# Patient Record
Sex: Female | Born: 1960 | Race: White | Hispanic: No | Smoking: Former smoker
Health system: Southern US, Community
[De-identification: ages and names within clinical notes are randomized; demographics above are authoritative.]

## PROBLEM LIST (undated history)

## (undated) DIAGNOSIS — R7303 Prediabetes: Secondary | ICD-10-CM

## (undated) DIAGNOSIS — M1990 Unspecified osteoarthritis, unspecified site: Secondary | ICD-10-CM

## (undated) DIAGNOSIS — J189 Pneumonia, unspecified organism: Secondary | ICD-10-CM

## (undated) DIAGNOSIS — J449 Chronic obstructive pulmonary disease, unspecified: Secondary | ICD-10-CM

## (undated) DIAGNOSIS — Z87442 Personal history of urinary calculi: Secondary | ICD-10-CM

## (undated) DIAGNOSIS — C50919 Malignant neoplasm of unspecified site of unspecified female breast: Secondary | ICD-10-CM

## (undated) DIAGNOSIS — T8859XA Other complications of anesthesia, initial encounter: Secondary | ICD-10-CM

## (undated) DIAGNOSIS — D649 Anemia, unspecified: Secondary | ICD-10-CM

## (undated) DIAGNOSIS — E7800 Pure hypercholesterolemia, unspecified: Secondary | ICD-10-CM

## (undated) DIAGNOSIS — I10 Essential (primary) hypertension: Secondary | ICD-10-CM

## (undated) DIAGNOSIS — Z972 Presence of dental prosthetic device (complete) (partial): Secondary | ICD-10-CM

## (undated) HISTORY — DX: Unspecified osteoarthritis, unspecified site: M19.90

## (undated) HISTORY — DX: Essential (primary) hypertension: I10

## (undated) HISTORY — PX: BREAST LUMPECTOMY: SHX2

## (undated) HISTORY — PX: PARTIAL HYSTERECTOMY: SHX80

## (undated) HISTORY — DX: Pure hypercholesterolemia, unspecified: E78.00

## (undated) HISTORY — DX: Prediabetes: R73.03

## (undated) HISTORY — DX: Malignant neoplasm of unspecified site of unspecified female breast: C50.919

## (undated) HISTORY — PX: HAND SURGERY: SHX662

## (undated) HISTORY — PX: BREAST BIOPSY: SHX20

## (undated) HISTORY — DX: Chronic obstructive pulmonary disease, unspecified: J44.9

## (undated) SURGERY — ESOPHAGOGASTRODUODENOSCOPY (EGD) WITH PROPOFOL
Anesthesia: Monitor Anesthesia Care

---

## 2014-01-29 DIAGNOSIS — C50919 Malignant neoplasm of unspecified site of unspecified female breast: Secondary | ICD-10-CM

## 2014-01-29 DIAGNOSIS — Z923 Personal history of irradiation: Secondary | ICD-10-CM

## 2014-01-29 HISTORY — DX: Personal history of irradiation: Z92.3

## 2014-01-29 HISTORY — PX: BREAST LUMPECTOMY WITH AXILLARY LYMPH NODE BIOPSY: SHX5593

## 2014-01-29 HISTORY — DX: Malignant neoplasm of unspecified site of unspecified female breast: C50.919

## 2015-01-30 HISTORY — PX: COLONOSCOPY: SHX174

## 2015-10-13 LAB — PULMONARY FUNCTION TEST

## 2016-09-20 DIAGNOSIS — G4700 Insomnia, unspecified: Secondary | ICD-10-CM | POA: Insufficient documentation

## 2016-09-20 DIAGNOSIS — C50919 Malignant neoplasm of unspecified site of unspecified female breast: Secondary | ICD-10-CM | POA: Insufficient documentation

## 2016-09-20 DIAGNOSIS — E782 Mixed hyperlipidemia: Secondary | ICD-10-CM | POA: Insufficient documentation

## 2016-09-20 DIAGNOSIS — J439 Emphysema, unspecified: Secondary | ICD-10-CM | POA: Insufficient documentation

## 2016-09-20 DIAGNOSIS — I10 Essential (primary) hypertension: Secondary | ICD-10-CM | POA: Insufficient documentation

## 2016-11-13 DIAGNOSIS — D751 Secondary polycythemia: Secondary | ICD-10-CM | POA: Insufficient documentation

## 2016-12-14 DIAGNOSIS — N289 Disorder of kidney and ureter, unspecified: Secondary | ICD-10-CM | POA: Insufficient documentation

## 2017-04-10 LAB — HM MAMMOGRAPHY

## 2017-05-24 DIAGNOSIS — Z9189 Other specified personal risk factors, not elsewhere classified: Secondary | ICD-10-CM | POA: Insufficient documentation

## 2017-05-29 DIAGNOSIS — M65311 Trigger thumb, right thumb: Secondary | ICD-10-CM | POA: Insufficient documentation

## 2017-05-29 DIAGNOSIS — R918 Other nonspecific abnormal finding of lung field: Secondary | ICD-10-CM | POA: Insufficient documentation

## 2017-05-29 DIAGNOSIS — Z5320 Procedure and treatment not carried out because of patient's decision for unspecified reasons: Secondary | ICD-10-CM | POA: Insufficient documentation

## 2017-05-29 DIAGNOSIS — J449 Chronic obstructive pulmonary disease, unspecified: Secondary | ICD-10-CM | POA: Insufficient documentation

## 2017-05-29 DIAGNOSIS — M1812 Unilateral primary osteoarthritis of first carpometacarpal joint, left hand: Secondary | ICD-10-CM | POA: Insufficient documentation

## 2017-05-29 LAB — HEMOGLOBIN A1C: Hemoglobin A1C: 6.5

## 2017-09-18 DIAGNOSIS — S4991XA Unspecified injury of right shoulder and upper arm, initial encounter: Secondary | ICD-10-CM | POA: Diagnosis not present

## 2017-09-18 DIAGNOSIS — M19011 Primary osteoarthritis, right shoulder: Secondary | ICD-10-CM | POA: Diagnosis not present

## 2017-09-18 DIAGNOSIS — S4992XA Unspecified injury of left shoulder and upper arm, initial encounter: Secondary | ICD-10-CM | POA: Diagnosis not present

## 2017-09-18 DIAGNOSIS — I10 Essential (primary) hypertension: Secondary | ICD-10-CM | POA: Diagnosis not present

## 2017-09-18 DIAGNOSIS — S199XXA Unspecified injury of neck, initial encounter: Secondary | ICD-10-CM | POA: Diagnosis not present

## 2017-09-18 DIAGNOSIS — M4312 Spondylolisthesis, cervical region: Secondary | ICD-10-CM | POA: Diagnosis not present

## 2017-09-18 DIAGNOSIS — M25511 Pain in right shoulder: Secondary | ICD-10-CM | POA: Diagnosis not present

## 2017-09-18 DIAGNOSIS — M47892 Other spondylosis, cervical region: Secondary | ICD-10-CM | POA: Diagnosis not present

## 2017-09-18 DIAGNOSIS — M50322 Other cervical disc degeneration at C5-C6 level: Secondary | ICD-10-CM | POA: Diagnosis not present

## 2017-09-18 DIAGNOSIS — M542 Cervicalgia: Secondary | ICD-10-CM | POA: Diagnosis not present

## 2017-09-23 ENCOUNTER — Ambulatory Visit (INDEPENDENT_AMBULATORY_CARE_PROVIDER_SITE_OTHER): Payer: BLUE CROSS/BLUE SHIELD | Admitting: Osteopathic Medicine

## 2017-09-23 ENCOUNTER — Encounter: Payer: Self-pay | Admitting: Osteopathic Medicine

## 2017-09-23 VITALS — BP 161/111 | HR 78 | Temp 98.3°F | Wt 130.7 lb

## 2017-09-23 DIAGNOSIS — I10 Essential (primary) hypertension: Secondary | ICD-10-CM

## 2017-09-23 DIAGNOSIS — S161XXD Strain of muscle, fascia and tendon at neck level, subsequent encounter: Secondary | ICD-10-CM | POA: Diagnosis not present

## 2017-09-23 DIAGNOSIS — E1169 Type 2 diabetes mellitus with other specified complication: Secondary | ICD-10-CM | POA: Insufficient documentation

## 2017-09-23 DIAGNOSIS — Z23 Encounter for immunization: Secondary | ICD-10-CM | POA: Diagnosis not present

## 2017-09-23 DIAGNOSIS — R7303 Prediabetes: Secondary | ICD-10-CM

## 2017-09-23 DIAGNOSIS — J449 Chronic obstructive pulmonary disease, unspecified: Secondary | ICD-10-CM

## 2017-09-23 MED ORDER — AMLODIPINE BESYLATE 5 MG PO TABS: 5.0000 mg | ORAL_TABLET | Freq: Every day | ORAL | 3 refills | Status: DC

## 2017-09-23 MED ORDER — PRAVASTATIN SODIUM 20 MG PO TABS: 20.0000 mg | ORAL_TABLET | Freq: Every day | ORAL | 3 refills | Status: DC

## 2017-09-23 MED ORDER — MELOXICAM 15 MG PO TABS: 15.0000 mg | ORAL_TABLET | Freq: Every day | ORAL | 3 refills | Status: DC

## 2017-09-23 MED ORDER — TIOTROPIUM BROMIDE MONOHYDRATE 2.5 MCG/ACT IN AERS: 1.0000 | INHALATION_SPRAY | Freq: Two times a day (BID) | RESPIRATORY_TRACT | 99 refills | Status: DC
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
## 2017-09-25 ENCOUNTER — Ambulatory Visit: Payer: BLUE CROSS/BLUE SHIELD | Admitting: Physical Therapy

## 2017-09-25 ENCOUNTER — Encounter: Payer: Self-pay | Admitting: Physical Therapy

## 2017-09-25 DIAGNOSIS — M542 Cervicalgia: Secondary | ICD-10-CM

## 2017-09-25 DIAGNOSIS — R293 Abnormal posture: Secondary | ICD-10-CM | POA: Diagnosis not present

## 2017-09-25 DIAGNOSIS — M6281 Muscle weakness (generalized): Secondary | ICD-10-CM | POA: Diagnosis not present

## 2017-09-25 DIAGNOSIS — M25611 Stiffness of right shoulder, not elsewhere classified: Secondary | ICD-10-CM

## 2017-09-25 DIAGNOSIS — J449 Chronic obstructive pulmonary disease, unspecified: Secondary | ICD-10-CM | POA: Diagnosis not present

## 2017-09-25 NOTE — Patient Instructions (Addendum)
Trigger Point Dry Needling  . What is Trigger Point Dry Needling (DN)? o DN is a physical therapy technique used to treat muscle pain and dysfunction. Specifically, DN helps deactivate muscle trigger points (muscle knots).  o A thin filiform needle is used to penetrate the skin and stimulate the underlying trigger point. The goal is for a local twitch response (LTR) to occur and for the trigger point to relax. No medication of any kind is injected during the procedure.   . What Does Trigger Point Dry Needling Feel Like?  o The procedure feels different for each individual patient. Some patients report that they do not actually feel the needle enter the skin and overall the process is not painful. Very mild bleeding may occur. However, many patients feel a deep cramping in the muscle in which the needle was inserted. This is the local twitch response.   Marland Kitchen How Will I feel after the treatment? o Soreness is normal, and the onset of soreness may not occur for a few hours. Typically this soreness does not last longer than two days.  o Bruising is uncommon, however; ice can be used to decrease any possible bruising.  o In rare cases feeling tired or nauseous after the treatment is normal. In addition, your symptoms may get worse before they get better, this period will typically not last longer than 24 hours.   . What Can I do After My Treatment? o Increase your hydration by drinking more water for the next 24 hours. o You may place ice or heat on the areas treated that have become sore, however, do not use heat on inflamed or bruised areas. Heat often brings more relief post needling. o You can continue your regular activities, but vigorous activity is not recommended initially after the treatment for 24 hours. o DN is best combined with other physical therapy such as strengthening, stretching, and other therapies.    Access Code: [email or web address]  URL: [email or web address]  Date: 09/25/2017   Prepared by: Faustino Congress   Exercises  Supine Cervical Rotation AROM on Pillow - 10 reps - 1 sets - 5 sec hold - 2x daily - 7x weekly  Supine Cervical Sidebending Stretch - 10 reps - 1 sets - 5 sec hold - 2x daily - 7x weekly
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
## 2017-10-02 ENCOUNTER — Telehealth: Payer: Self-pay

## 2017-10-02 ENCOUNTER — Encounter: Payer: Self-pay | Admitting: Physical Therapy

## 2017-10-02 ENCOUNTER — Ambulatory Visit: Payer: BLUE CROSS/BLUE SHIELD | Admitting: Physical Therapy

## 2017-10-02 DIAGNOSIS — M542 Cervicalgia: Secondary | ICD-10-CM

## 2017-10-02 DIAGNOSIS — M25611 Stiffness of right shoulder, not elsewhere classified: Secondary | ICD-10-CM | POA: Diagnosis not present

## 2017-10-02 DIAGNOSIS — R293 Abnormal posture: Secondary | ICD-10-CM | POA: Diagnosis not present

## 2017-10-02 DIAGNOSIS — M6281 Muscle weakness (generalized): Secondary | ICD-10-CM | POA: Diagnosis not present

## 2017-10-02 MED ORDER — UMECLIDINIUM BROMIDE 62.5 MCG/INH IN AEPB: 1.0000 | INHALATION_SPRAY | Freq: Every day | RESPIRATORY_TRACT | 11 refills | Status: DC

## 2017-10-02 NOTE — Telephone Encounter (Signed)
As per [employer]  (Ref #. [id number]) - spiriva rx is not covered by ins. Preferred formulary is [employer]. Pls advise, if change is appropriate.

## 2017-10-02 NOTE — Telephone Encounter (Signed)
Sent!

## 2017-10-02 NOTE — Therapy (Signed)
[place], [place], [address] Phone: [phone]   Fax:  [phone]  Physical Therapy Treatment  Patient Details  Name: Sherri Gibson MRN: [id number] Date of Birth: [birth year] Referring Provider: Emeterio Reeve, DO   Encounter Date: 10/02/2017  PT End of Session - 10/02/17 0824    Visit Number  2    Number of Visits  12    Date for PT Re-Evaluation  11/06/17    Authorization Type  [employer] $35 copay (MVC)    PT Start Time  0745    PT Stop Time  0839    PT Time Calculation (min)  54 min    Activity Tolerance  Patient tolerated treatment well    Behavior During Therapy  [facility] for tasks assessed/performed       Past Medical History:  Diagnosis Date  . Breast cancer ([place]) 2016   Left   . COPD (chronic obstructive pulmonary disease) ([place])   . High blood pressure   . High cholesterol     Past Surgical History:  Procedure Laterality Date  . BREAST LUMPECTOMY WITH AXILLARY LYMPH NODE BIOPSY  2016  . PARTIAL HYSTERECTOMY      There were no vitals filed for this visit.  Subjective Assessment - 10/02/17 0743    Subjective  doing okay; still stiff and sore    Patient Stated Goals  improve pain and motion, be able to sleep better    Currently in Pain?  Yes    Pain Score  8     Pain Location  Neck    Pain Orientation  Right    Pain Descriptors / Indicators  Sharp;Aching;Dull    Pain Type  Acute pain    Pain Onset  1 to 4 weeks ago    Pain Frequency  Constant    Aggravating Factors   turning head, trying to sleep on stomach, difficulty with looking up/down    Pain Relieving Factors  heat/ice; muscle relaxer                       [facility] Adult PT Treatment/Exercise - 10/02/17 0748      Exercises   Exercises  Neck      Neck Exercises: Machines for Strengthening   UBE (Upper Arm Bike)  L1 x 2 min; forward only (increased difficulty)      Neck Exercises: Supine   Neck  Retraction  10 reps;5 secs    Cervical Rotation  5 reps;Both   10-15 sec hold   Lateral Flexion  Both;5 reps   10-15 sec hold     Moist Heat Therapy   Number Minutes Moist Heat  15 Minutes    Moist Heat Location  Cervical      Electrical Stimulation   Electrical Stimulation Location  Rt posterior neck/shoulder    Electrical Stimulation Action  IFC    Electrical Stimulation Parameters  to tolerance x 15 min    Electrical Stimulation Goals  Pain      Manual Therapy   Manual Therapy  Soft tissue mobilization    Manual therapy comments  pt prone; skilled palpation and monitoring of soft tissue during DN    Soft tissue mobilization  Rt upper traps and bil suboccipital release; STM to Rt cervical paraspinals       Trigger Point Dry Needling - 10/02/17 0823    Consent Given?  Yes    Education Handout Provided  Yes  Muscles Treated Upper Body  Upper trapezius    Upper Trapezius Response  Twitch reponse elicited;Palpable increased muscle length           PT Education - 10/02/17 0830    Education Details  HEP    Person(s) Educated  Patient    Methods  Explanation;Demonstration;Handout    Comprehension  Verbalized understanding;Returned demonstration;Need further instruction          PT Long Term Goals - 09/25/17 0955      PT LONG TERM GOAL #1   Title  independent with HEP    Status  New    Target Date  11/06/17      PT LONG TERM GOAL #2   Title  improve cervical AROM to at least 20 degrees all motions for improved mobility    Status  New    Target Date  11/06/17      PT LONG TERM GOAL #3   Title  report pain < 5/10 with ADLs for improved function and pain    Status  New    Target Date  11/06/17      PT LONG TERM GOAL #4   Title  report 75% improvement in sleep for improved pain    Status  New    Target Date  11/06/17      PT LONG TERM GOAL #5   Title  FOTO score improved to < 45% limitation for improved functional mobility    Status  New    Target Date   11/06/17            Plan - 10/02/17 0825    Clinical Impression Statement  Pt continues to report elevated pain and guarded posture with limited cervical ROM.  DN to Rt upper trap today with pt reporting improved cervical rotation following, but declined additional muscle groups due to pain.  Tolerated STM well with decreased pain following. No goals met as only 2nd visit; and will continue to benefit from PT to maximize function.    Rehab Potential  Good    Clinical Impairments Affecting Rehab Potential  currently has rental car, and may have to return limiting access to get to PT    PT Frequency  2x / week    PT Duration  6 weeks    PT Treatment/Interventions  ADLs/Self Care Home Management;Cryotherapy;Electrical Stimulation;Moist Heat;Traction;Therapeutic exercise;Therapeutic activities;Ultrasound;Neuromuscular re-education;Patient/family education;Manual techniques;Taping;Dry needling;Passive range of motion    PT Next Visit Plan  review HEP, continue with neck/shoulder ROM (add to HEP), manual/DN/modalities PRN    PT Home Exercise Plan  Access Code: [email or web address]     Consulted and Agree with Plan of Care  Patient       Patient will benefit from skilled therapeutic intervention in order to improve the following deficits and impairments:  Increased fascial restricitons, Increased muscle spasms, Decreased strength, Pain, Impaired UE functional use, Decreased range of motion, Postural dysfunction, Impaired flexibility  Visit Diagnosis: Cervicalgia  Muscle weakness (generalized)  Stiffness of right shoulder, not elsewhere classified  Abnormal posture     Problem List Patient Active Problem List   Diagnosis Date Noted  . Prediabetes 09/23/2017  . Colonoscopy refused 05/29/2017  . COPD, severe ([place]) 05/29/2017  . Pulmonary nodules 05/29/2017  . Trigger thumb, right thumb 05/29/2017  . Primary osteoarthritis of first carpometacarpal joint of left hand 05/29/2017  . Kidney lesion  12/14/2016  . Erythrocytosis 11/13/2016  . Breast cancer ([place]) 09/20/2016  . Essential hypertension 09/20/2016  . Insomnia 09/20/2016  .  Mixed hyperlipidemia 09/20/2016  . Pulmonary emphysema ([place]) 09/20/2016      Laureen Abrahams, PT, DPT 10/02/17 8:41 AM    [facility] [place], [place], [address] Phone: [phone]   Fax:  [phone]  Name: Sherri Gibson MRN: [id number] Date of Birth: 01/24/1961

## 2017-10-02 NOTE — Patient Instructions (Signed)
Access Code: [email or web address]  URL: [email or web address]  Date: 10/02/2017  Prepared by: Faustino Congress   Exercises  Supine Cervical Rotation AROM on Pillow - 10 reps - 1 sets - 5 sec hold - 2x daily - 7x weekly  Supine Cervical Sidebending Stretch - 10 reps - 1 sets - 5 sec hold - 2x daily - 7x weekly  Supine Chin Tuck - 10 reps - 1 sets - 5 sec hold - 2x daily - 7x weekly

## 2017-10-04 ENCOUNTER — Ambulatory Visit: Payer: BLUE CROSS/BLUE SHIELD | Admitting: Physical Therapy

## 2017-10-04 DIAGNOSIS — M6281 Muscle weakness (generalized): Secondary | ICD-10-CM | POA: Diagnosis not present

## 2017-10-04 DIAGNOSIS — R293 Abnormal posture: Secondary | ICD-10-CM | POA: Diagnosis not present

## 2017-10-04 DIAGNOSIS — M542 Cervicalgia: Secondary | ICD-10-CM

## 2017-10-04 DIAGNOSIS — M25611 Stiffness of right shoulder, not elsewhere classified: Secondary | ICD-10-CM | POA: Diagnosis not present

## 2017-10-04 NOTE — Therapy (Signed)
[place], [place], [address] Phone: [phone]   Fax:  [phone]  Physical Therapy Treatment  Patient Details  Name: Sherri Gibson MRN: [id number] Date of Birth: 04/28/1960 Referring Provider: Emeterio Reeve, DO   Encounter Date: 10/04/2017  PT End of Session - 10/04/17 0801    Visit Number  3    Number of Visits  12    Date for PT Re-Evaluation  11/06/17    Authorization Type  [employer]BS $35 copay (MVC)    PT Start Time  0801    PT Stop Time  0855    PT Time Calculation (min)  54 min    Activity Tolerance  Patient limited by pain    Behavior During Therapy  [facility] for tasks assessed/performed       Past Medical History:  Diagnosis Date  . Breast cancer ([place]) 2016   Left   . COPD (chronic obstructive pulmonary disease) ([place])   . High blood pressure   . High cholesterol     Past Surgical History:  Procedure Laterality Date  . BREAST LUMPECTOMY WITH AXILLARY LYMPH NODE BIOPSY  2016  . PARTIAL HYSTERECTOMY      There were no vitals filed for this visit.  Subjective Assessment - 10/04/17 0804    Subjective  Pt reports that one of the exercises seems to be making her neck tighter.  She has not been able to sleep at all, since she can't sleep on her stomach as usual. "I didn't care for the needles".     Patient Stated Goals  improve pain and motion, be able to sleep better    Currently in Pain?  Yes    Pain Score  7     Pain Location  Neck    Pain Orientation  Right    Pain Descriptors / Indicators  Sharp    Aggravating Factors   turning head or looking up/down    Pain Relieving Factors  heat, ice, muscle relaxer.          [facility] PT Assessment - 10/04/17 0001      Assessment   Medical Diagnosis  S16.1XXD (ICD-10-CM) - Acute strain of neck muscle, subsequent encounter    Referring Provider  Emeterio Reeve, DO    Onset Date/Surgical Date  09/13/17    Hand Dominance  Right     Next MD Visit  Nov 2019      AROM   AROM Assessment Site  Cervical    Cervical Flexion  10    Cervical Extension  10    Cervical - Right Side Bend  7    Cervical - Left Side Bend  7    Cervical - Right Rotation  18    Cervical - Left Rotation  14        [facility] Adult PT Treatment/Exercise - 10/04/17 0001      Neck Exercises: Machines for Strengthening   UBE (Upper Arm Bike)  L1: 1.25 min each direction, standing      Neck Exercises: Supine   Cervical Rotation  Right;Left   3 reps, 5 sec holds   Other Supine Exercise  prolonged snow angel (arms ~~70 deg) x 5 reps, then 5 active ones to tolerance    Other Supine Exercise  scap retraction x 5 sec hold, x 5 reps       Shoulder Exercises: Supine   Flexion  AAROM;Both;5 reps   3 reps with cane, 2 with  hands laced.    Other Supine Exercises  bench press motion with dowel x 8 reps (challenging, guarded)      Moist Heat Therapy   Number Minutes Moist Heat  15 Minutes    Moist Heat Location  Cervical   thoracic     Electrical Stimulation   Electrical Stimulation Location  Rt posterior neck/shoulder    Electrical Stimulation Action  IFC    Electrical Stimulation Parameters  to tolerance, 15 min     Electrical Stimulation Goals  Pain      Manual Therapy   Soft tissue mobilization  very gentle STM and TPR to Rt upper thoracic paraspinals, upper trap, and levator. Pt very guarded throughout.       Neck Exercises: Stretches   Upper Trapezius Stretch  Left;Right;2 reps;10 seconds   limited motion; guarded   Other Neck Stretches  attempted shoulder rolls, very guarded.       Pt in semi-reclined for MHP at end of session (due to COPD) for increased comfort.  spO2 95-97% during session.        PT Education - 10/04/17 [phone]    Education Details  HEP     Person(s) Educated  Patient    Methods  Explanation;Handout;Verbal cues    Comprehension  Verbalized understanding;Returned demonstration          PT Long Term Goals -  10/04/17 1256      PT LONG TERM GOAL #1   Title  independent with HEP    Status  On-going      PT LONG TERM GOAL #2   Title  improve cervical AROM to at least 20 degrees all motions for improved mobility    Status  On-going      PT LONG TERM GOAL #3   Title  report pain < 5/10 with ADLs for improved function and pain    Status  On-going      PT LONG TERM GOAL #4   Title  report 75% improvement in sleep for improved pain    Status  On-going      PT LONG TERM GOAL #5   Title  FOTO score improved to < 45% limitation for improved functional mobility    Status  On-going            Plan - 10/04/17 0846    Clinical Impression Statement  Pt continues with limited cervical and Rt shoulder ROM.  Cervical rotation ROM slightly improved.  She is very guarded with exercises and manual therapy.  Progressing gradually towards goals.     Rehab Potential  Good    PT Frequency  2x / week    PT Duration  6 weeks    PT Treatment/Interventions  ADLs/Self Care Home Management;Cryotherapy;Electrical Stimulation;Moist Heat;Traction;Therapeutic exercise;Therapeutic activities;Ultrasound;Neuromuscular re-education;Patient/family education;Manual techniques;Taping;Dry needling;Passive range of motion    PT Next Visit Plan  continue with neck/shoulder ROM (add to HEP), manual/modalities PRN    Consulted and Agree with Plan of Care  Patient       Patient will benefit from skilled therapeutic intervention in order to improve the following deficits and impairments:  Increased fascial restricitons, Increased muscle spasms, Decreased strength, Pain, Impaired UE functional use, Decreased range of motion, Postural dysfunction, Impaired flexibility  Visit Diagnosis: Cervicalgia  Muscle weakness (generalized)  Stiffness of right shoulder, not elsewhere classified  Abnormal posture     Problem List Patient Active Problem List   Diagnosis Date Noted  . Prediabetes 09/23/2017  . Colonoscopy refused  05/29/2017  . COPD, severe ([place]) 05/29/2017  . Pulmonary nodules 05/29/2017  . Trigger thumb, right thumb 05/29/2017  . Primary osteoarthritis of first carpometacarpal joint of left hand 05/29/2017  . Kidney lesion 12/14/2016  . Erythrocytosis 11/13/2016  . Breast cancer ([place]) 09/20/2016  . Essential hypertension 09/20/2016  . Insomnia 09/20/2016  . Mixed hyperlipidemia 09/20/2016  . Pulmonary emphysema ([place]) 09/20/2016   Kerin Perna, PTA 10/04/17 12:57 PM  [place], [place], [address] Phone: [phone]   Fax:  [phone]  Name: TRANG BOUSE MRN: [id number] Date of Birth: [birth year]

## 2017-10-04 NOTE — Patient Instructions (Signed)
  Cane Exercise: Flexion    Lie on back, holding cane above chest. Keeping arms as straight as possible, lower cane toward floor beyond head. Hold __2-5__ seconds. Repeat _5-10___ times. Do __1__ sessions per day.  Angels in the [place]: Double Arm    Arms near sides, palms up. Press both arms lightly into floor, slide arms out to side and up alongside head. Keep contact with floor throughout motion. At maximal position, lengthen arms. Hold _15__ seconds. Relax. Slide arms back to start. Repeat _5__ times.  Lower Trunk Rotation Stretch    Keeping back flat and feet together, rotate knees to left side. Hold __10__ seconds. Repeat __5__ times per set. Do __1__ sets per session. Do __1__ sessions per day.

## 2017-10-07 ENCOUNTER — Ambulatory Visit: Payer: BLUE CROSS/BLUE SHIELD | Admitting: Physical Therapy

## 2017-10-07 DIAGNOSIS — M25611 Stiffness of right shoulder, not elsewhere classified: Secondary | ICD-10-CM

## 2017-10-07 DIAGNOSIS — M542 Cervicalgia: Secondary | ICD-10-CM | POA: Diagnosis not present

## 2017-10-07 DIAGNOSIS — R293 Abnormal posture: Secondary | ICD-10-CM | POA: Diagnosis not present

## 2017-10-07 DIAGNOSIS — M6281 Muscle weakness (generalized): Secondary | ICD-10-CM | POA: Diagnosis not present

## 2017-10-07 NOTE — Therapy (Signed)
[place], [place], [address] Phone: [phone]   Fax:  [phone]  Physical Therapy Treatment  Patient Details  Name: Sherri Gibson MRN: [id number] Date of Birth: 27-Jun-1960 Referring Provider: Emeterio Reeve, DO   Encounter Date: 10/07/2017  PT End of Session - 10/07/17 0801    Visit Number  4    Number of Visits  12    Date for PT Re-Evaluation  11/06/17    Authorization Type  BCBS $35 copay (MVC)    PT Start Time  0719    PT Stop Time  0812    PT Time Calculation (min)  53 min    Activity Tolerance  Patient limited by pain    Behavior During Therapy  [facility] for tasks assessed/performed       Past Medical History:  Diagnosis Date  . Breast cancer ([place]) 2016   Left   . COPD (chronic obstructive pulmonary disease) ([place])   . High blood pressure   . High cholesterol     Past Surgical History:  Procedure Laterality Date  . BREAST LUMPECTOMY WITH AXILLARY LYMPH NODE BIOPSY  2016  . PARTIAL HYSTERECTOMY      There were no vitals filed for this visit.  Subjective Assessment - 10/07/17 0728    Subjective  Pt reports she has had about 4 hours of sleep the whole weekend.  She is stressed due to her brother being gravely ill. With being so stressed she feels it only tightens her muscles even more.  She has been using her TENS, limited exercises.      Currently in Pain?  Yes    Pain Score  7     Pain Location  Neck    Pain Orientation  Right;Left;Upper;Lower;Mid    Pain Descriptors / Indicators  Aching;Sharp    Aggravating Factors   turning head, looking up/down    Pain Relieving Factors  heat, muscle relaxer, TENS         [facility] PT Assessment - 10/07/17 0001      Assessment   Medical Diagnosis  S16.1XXD (ICD-10-CM) - Acute strain of neck muscle, subsequent encounter    Referring Provider  Emeterio Reeve, DO    Onset Date/Surgical Date  09/13/17    Hand Dominance  Right    Next MD Visit  Nov 2019       [facility] Adult PT Treatment/Exercise - 10/07/17 0001      Neck Exercises: Seated   W Back  5 reps    Other Seated Exercise  scap retraction, sliding hands on thighs, x 5 reps    Other Seated Exercise  shoulder rolls x 5 reps.       Neck Exercises: Supine   Neck Retraction  10 reps   with nods    Cervical Rotation  Right;Left;5 reps   with nods, to tolerance   Other Supine Exercise  prolonged snow angel (arms ~~70 deg) x 5 reps, then 5 active ones to tolerance      Shoulder Exercises: Supine   Other Supine Exercises  bench press motion with dowel x 4 reps (challenging, guarded);  back of hand to forehead (for AROM of abd/ ER of shoulders) x 8 reps    MHP under pt during exercise.      Moist Heat Therapy   Number Minutes Moist Heat  15 Minutes    Moist Heat Location  Cervical   pt seated, per request  Acupuncturist Location  Rt posterior neck/shoulder    Electrical Stimulation Action  IFC    Electrical Stimulation Parameters  to tolerance    Electrical Stimulation Goals  Pain;Tone      Manual Therapy   Manual Therapy  Passive ROM    Soft tissue mobilization  very gentle TPR to Rt upper trap, levator, and thoracic paraspinals (last spot caused cramp in Rt low back)     Passive ROM  PROM to Rt shoulder in all planes and directions except ext, very guarded with flexion       Lap around gym x 2 reps with cues to increase arm swing to decrease stiffness in neck/UE.       PT Education - 10/07/17 0800    Education Details  encouraged pt to resume walking program, including rationale.     Person(s) Educated  Patient    Methods  Explanation    Comprehension  Verbalized understanding          PT Long Term Goals - 10/04/17 1256      PT LONG TERM GOAL #1   Title  independent with HEP    Status  On-going      PT LONG TERM GOAL #2   Title  improve cervical AROM to at least 20 degrees all motions for  improved mobility    Status  On-going      PT LONG TERM GOAL #3   Title  report pain < 5/10 with ADLs for improved function and pain    Status  On-going      PT LONG TERM GOAL #4   Title  report 75% improvement in sleep for improved pain    Status  On-going      PT LONG TERM GOAL #5   Title  FOTO score improved to < 45% limitation for improved functional mobility    Status  On-going            Plan - 10/07/17 0801    Clinical Impression Statement  Continued guarding in cervical and Rt shoulder motion with exercise and PROM.  Pt tolerated 5 reps of most exercises. Encouraged pt to include more movement into day (resume her walking program, but at lower intensity) as well as encouraged her to speak to MD regarding medications for pain/ muscle spasming.   Pt making gradual progress towards goals.    Rehab Potential  Good    PT Frequency  2x / week    PT Duration  6 weeks    PT Treatment/Interventions  ADLs/Self Care Home Management;Cryotherapy;Electrical Stimulation;Moist Heat;Traction;Therapeutic exercise;Therapeutic activities;Ultrasound;Neuromuscular re-education;Patient/family education;Manual techniques;Taping;Dry needling;Passive range of motion    PT Next Visit Plan  continue with neck/shoulder ROM (add to HEP), manual/modalities PRN    Consulted and Agree with Plan of Care  Patient       Patient will benefit from skilled therapeutic intervention in order to improve the following deficits and impairments:  Increased fascial restricitons, Increased muscle spasms, Decreased strength, Pain, Impaired UE functional use, Decreased range of motion, Postural dysfunction, Impaired flexibility  Visit Diagnosis: Cervicalgia  Muscle weakness (generalized)  Stiffness of right shoulder, not elsewhere classified  Abnormal posture     Problem List Patient Active Problem List   Diagnosis Date Noted  . Prediabetes 09/23/2017  . Colonoscopy refused 05/29/2017  . COPD, severe  ([place]) 05/29/2017  . Pulmonary nodules 05/29/2017  . Trigger thumb, right thumb 05/29/2017  . Primary osteoarthritis of first carpometacarpal joint of  left hand 05/29/2017  . Kidney lesion 12/14/2016  . Erythrocytosis 11/13/2016  . Breast cancer ([place]) 09/20/2016  . Essential hypertension 09/20/2016  . Insomnia 09/20/2016  . Mixed hyperlipidemia 09/20/2016  . Pulmonary emphysema ([place]) 09/20/2016   Kerin Perna, PTA 10/07/17 9:53 AM  [facility] [place], [place], [address] Phone: [phone]   Fax:  [phone]  Name: Sherri Gibson MRN: [id number] Date of Birth: 1960/05/05

## 2017-10-09 DIAGNOSIS — R030 Elevated blood-pressure reading, without diagnosis of hypertension: Secondary | ICD-10-CM | POA: Diagnosis not present

## 2017-10-09 DIAGNOSIS — Z885 Allergy status to narcotic agent status: Secondary | ICD-10-CM | POA: Diagnosis not present

## 2017-10-09 DIAGNOSIS — Z79811 Long term (current) use of aromatase inhibitors: Secondary | ICD-10-CM | POA: Diagnosis not present

## 2017-10-09 DIAGNOSIS — J449 Chronic obstructive pulmonary disease, unspecified: Secondary | ICD-10-CM | POA: Diagnosis not present

## 2017-10-09 DIAGNOSIS — Z887 Allergy status to serum and vaccine status: Secondary | ICD-10-CM | POA: Diagnosis not present

## 2017-10-09 DIAGNOSIS — I10 Essential (primary) hypertension: Secondary | ICD-10-CM | POA: Diagnosis not present

## 2017-10-09 DIAGNOSIS — R7989 Other specified abnormal findings of blood chemistry: Secondary | ICD-10-CM | POA: Diagnosis not present

## 2017-10-09 DIAGNOSIS — C50912 Malignant neoplasm of unspecified site of left female breast: Secondary | ICD-10-CM | POA: Diagnosis not present

## 2017-10-09 DIAGNOSIS — E785 Hyperlipidemia, unspecified: Secondary | ICD-10-CM | POA: Diagnosis not present

## 2017-10-09 DIAGNOSIS — Z791 Long term (current) use of non-steroidal anti-inflammatories (NSAID): Secondary | ICD-10-CM | POA: Diagnosis not present

## 2017-10-09 DIAGNOSIS — Z79899 Other long term (current) drug therapy: Secondary | ICD-10-CM | POA: Diagnosis not present

## 2017-10-09 DIAGNOSIS — Z87891 Personal history of nicotine dependence: Secondary | ICD-10-CM | POA: Diagnosis not present

## 2017-10-09 LAB — LIPID PANEL
CHOLESTEROL: 186 (ref 0–200)
HDL: 58 (ref 35–70)
LDL CALC: 112
TRIGLYCERIDES: 82 (ref 40–160)

## 2017-10-09 LAB — BASIC METABOLIC PANEL
BUN: 17 (ref 4–21)
Creatinine: 0.6 (ref 0.5–1.1)
Glucose: 119
Potassium: 4.4 (ref 3.4–5.3)
Sodium: 139 (ref 137–147)

## 2017-10-09 LAB — HEPATIC FUNCTION PANEL
AST: 19 (ref 13–35)
Alkaline Phosphatase: 75 (ref 25–125)
Bilirubin, Total: 0.4

## 2017-10-10 ENCOUNTER — Emergency Department
Admission: EM | Admit: 2017-10-10 | Discharge: 2017-10-10 | Disposition: A | Discharge status: Home / Self Care | Payer: BLUE CROSS/BLUE SHIELD | Source: Home / Self Care | Attending: Family Medicine | Admitting: Family Medicine

## 2017-10-10 ENCOUNTER — Encounter: Payer: Self-pay | Admitting: *Deleted

## 2017-10-10 ENCOUNTER — Encounter: Payer: BLUE CROSS/BLUE SHIELD | Admitting: Physical Therapy

## 2017-10-10 DIAGNOSIS — L03012 Cellulitis of left finger: Secondary | ICD-10-CM

## 2017-10-10 MED ORDER — DOXYCYCLINE HYCLATE 100 MG PO CAPS: 100.0000 mg | ORAL_CAPSULE | Freq: Two times a day (BID) | ORAL | 0 refills | Status: DC

## 2017-10-10 MED ORDER — CEFTRIAXONE SODIUM 1 G IJ SOLR
1.0000 g | Freq: Once | INTRAMUSCULAR | Status: AC
  Administered 2017-10-10: 1 g via INTRAMUSCULAR

## 2017-10-10 NOTE — ED Triage Notes (Signed)
Patient c/o left index finger redness and swelling without injury x 2-3 days.

## 2017-10-10 NOTE — Discharge Instructions (Signed)
°  Please take your doxycycline (antibiotic) prescribed this evening.   Please call Sports Medicine tomorrow at 8AM to schedule a follow up appointment tomorrow for recheck of symptoms. There is concern the infection is involves the tendon in your finger. If not treated promptly, you may have worsening infection and permanent disfiguration of your finger. In rare cases, surgery may be needed.  If you cannot get into Sports Medicine tomorrow, please return to urgent care tomorrow before 4PM for a recheck in case a specialist is needed to be contacted.

## 2017-10-10 NOTE — ED Provider Notes (Signed)
[name]    CSN: [id number] Arrival date & time: 10/10/17  1912     History   Chief Complaint Chief Complaint  Patient presents with  . finger swelling    HPI Sherri Gibson is a 57 y.o. female.   HPI  Sherri Gibson is a 58 y.o. female presenting to UC with c/o 2-3 days Left index finger pain, swelling, redness and one day of deformity. She initially thought she had a splinter but has soaked her finger w/o relief.  Pain is aching and sore, worse with light touch. She is unable to fully straighten the distal end of her finger as of today.  No known injury.     Past Medical History:  Diagnosis Date  . Breast cancer ([place]) 2016   Left   . COPD (chronic obstructive pulmonary disease) ([place])   . High blood pressure   . High cholesterol     Patient Active Problem List   Diagnosis Date Noted  . Prediabetes 09/23/2017  . Colonoscopy refused 05/29/2017  . 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 Pulmonary nodules 05/29/2017  . Trigger thumb, right thumb 05/29/2017  . Primary osteoarthritis of first carpometacarpal joint of left hand 05/29/2017  . Kidney lesion 12/14/2016  . Erythrocytosis 11/13/2016  . Breast cancer ([place]) 09/20/2016  . Essential hypertension 09/20/2016  . Insomnia 09/20/2016  . Mixed hyperlipidemia 09/20/2016  . Pulmonary emphysema ([place]) 09/20/2016    Past Surgical History:  Procedure Laterality Date  . BREAST LUMPECTOMY WITH AXILLARY LYMPH NODE BIOPSY  2016  . PARTIAL HYSTERECTOMY      OB History   None      Home Medications    Prior to Admission medications   Medication Sig Start Date End Date Taking? Authorizing Provider  albuterol (PROAIR HFA) 108 (90 Base) MCG/ACT inhaler inhale 2 puff by inhalation route  every 4 - 6 hours as needed 09/21/16   [provider]  albuterol (PROVENTIL) (2.5 MG/3ML) 0.083% nebulizer solution Take 3 mLs (2.5 mg total) by nebulization every 6 (six) hours as needed for Wheezing. 11/19/16    [provider]  amLODipine (NORVASC) 5 MG tablet Take 1 tablet (5 mg total) by mouth daily. 09/23/17   Emeterio Reeve, DO  anastrozole (ARIMIDEX) 1 MG tablet TAKE 1 TABLET ONCE A DAY 03/27/17   [provider]  aspirin 81 MG chewable tablet Chew by mouth.    [provider]  budesonide-formoterol (SYMBICORT) 160-4.5 MCG/ACT inhaler Inhale into the lungs. 11/19/16   [provider]  cyclobenzaprine (FLEXERIL) 10 MG tablet Take by mouth. 09/20/16   [provider]  doxycycline (VIBRAMYCIN) 100 MG capsule Take 1 capsule (100 mg total) by mouth 2 (two) times daily. One po bid x 7 days 10/10/17   Noe Gens, PA-C  meloxicam (MOBIC) 15 MG tablet Take 1 tablet (15 mg total) by mouth daily. 09/23/17   Emeterio Reeve, DO  montelukast (SINGULAIR) 10 MG tablet TAKE 1 TABLET BY MOUTH EVERYDAY AT BEDTIME 02/08/17   [provider]  pravastatin (PRAVACHOL) 20 MG tablet Take 1 tablet (20 mg total) by mouth daily. 09/23/17   Emeterio Reeve, DO  Tiotropium Bromide Monohydrate 2.5 MCG/ACT AERS Inhale 1 puff into the lungs 2 (two) times daily. 09/23/17   Emeterio Reeve, DO  traZODone (DESYREL) 50 MG tablet TAKE 1 TABLET BY MOUTH EVERYDAY AT BEDTIME 07/01/17   [provider]  umeclidinium bromide (INCRUSE ELLIPTA) 62.5 MCG/INH AEPB Inhale 1 puff into the lungs  daily. 10/02/17   Emeterio Reeve, DO    Family History Family History  Problem Relation Age of Onset  . Breast cancer Mother   . Diabetes Brother     Social History Social History   Tobacco Use  . Smoking status: Former Smoker    Packs/day: 2.00    Years: 40.00    Pack years: 80.00  . Smokeless tobacco: Never Used  Substance Use Topics  . Alcohol use: Not Currently  . Drug use: Never     Allergies   Codeine and Tetanus toxoids   Review of Systems Review of Systems  Musculoskeletal: Positive for arthralgias and joint swelling.  Skin: Positive for color change.  Negative for wound.     Physical Exam Triage Vital Signs ED Triage Vitals [10/10/17 1926]  Enc Vitals Group     BP 138/88     Pulse Rate 82     Resp 14     Temp 98.3 F (36.8 C)     Temp Source Oral     SpO2 96 %     Weight 130 lb (59 kg)     Height      Head Circumference      Peak Flow      Pain Score 8     Pain Loc      Pain Edu?      Excl. in [place]?    No data found.  Updated Vital Signs BP 138/88 (BP Location: Right Arm)   Pulse 82   Temp 98.3 F (36.8 C) (Oral)   Resp 14   Wt 130 lb (59 kg)   SpO2 96%   Visual Acuity Right Eye Distance:   Left Eye Distance:   Bilateral Distance:    Right Eye Near:   Left Eye Near:    Bilateral Near:     Physical Exam  Constitutional: She is oriented to person, place, and time. She appears well-developed and well-nourished.  HENT:  Head: Normocephalic and atraumatic.  Eyes: EOM are normal.  Neck: Normal range of motion.  Cardiovascular: Normal rate.  Pulmonary/Chest: Effort normal.  Musculoskeletal: She exhibits edema, tenderness and deformity.  Left index finger: mallet deformity. Mild edema and tenderness at distal aspect of finger.   Neurological: She is alert and oriented to person, place, and time.  Skin: Skin is warm and dry. Capillary refill takes less than 2 seconds. There is erythema.  Left index finger: mild edema. Tenderness distal phalanx. No induration or fluctuance. No bleeding or drainage. No red streaking.   Psychiatric: She has a normal mood and affect. Her behavior is normal.  Nursing note and vitals reviewed.    UC Treatments / Results  Labs (all labs ordered are listed, but only abnormal results are displayed) Labs Reviewed - No data to display  EKG None  Radiology No results found.  Procedures Procedures (including critical care time)  Medications Ordered in UC Medications  cefTRIAXone (ROCEPHIN) injection 1 g (1 g Intramuscular Given 10/10/17 1946)    Initial Impression /  Assessment and Plan / UC Course  I have reviewed the triage vital signs and the nursing notes.  Pertinent labs & imaging results that were available during my care of the patient were reviewed by me and considered in my medical decision making (see chart for details).     Hx and exam c/w skin infection at distal aspect of Left index finger. Strongly encouraged pt to return tomorrow for recheck.  Rocephin 1g IM given this  evening.   Final Clinical Impressions(s) / UC Diagnoses   Final diagnoses:  Cellulitis of left index finger     Discharge Instructions      Please take your doxycycline (antibiotic) prescribed this evening.   Please call Sports Medicine tomorrow at 8AM to schedule a follow up appointment tomorrow for recheck of symptoms. There is concern the infection is involves the tendon in your finger. If not treated promptly, you may have worsening infection and permanent disfiguration of your finger. In rare cases, surgery may be needed.  If you cannot get into Sports Medicine tomorrow, please return to urgent care tomorrow before 4PM for a recheck in case a specialist is needed to be contacted.     ED Prescriptions    Medication Sig Dispense Auth. Provider   doxycycline (VIBRAMYCIN) 100 MG capsule Take 1 capsule (100 mg total) by mouth 2 (two) times daily. One po bid x 7 days 14 capsule Noe Gens, [place]     Controlled Substance Prescriptions [place] Controlled Substance Registry consulted? Not Applicable   Tyrell Antonio 10/10/17 2234

## 2017-10-11 ENCOUNTER — Ambulatory Visit: Payer: BLUE CROSS/BLUE SHIELD | Admitting: Family Medicine

## 2017-10-11 ENCOUNTER — Encounter: Payer: Self-pay | Admitting: Family Medicine

## 2017-10-11 ENCOUNTER — Ambulatory Visit (INDEPENDENT_AMBULATORY_CARE_PROVIDER_SITE_OTHER): Payer: BLUE CROSS/BLUE SHIELD

## 2017-10-11 VITALS — BP 147/73 | HR 76 | Ht 59.0 in | Wt 130.0 lb

## 2017-10-11 DIAGNOSIS — M79645 Pain in left finger(s): Secondary | ICD-10-CM | POA: Diagnosis not present

## 2017-10-11 DIAGNOSIS — E119 Type 2 diabetes mellitus without complications: Secondary | ICD-10-CM | POA: Diagnosis not present

## 2017-10-11 DIAGNOSIS — M1120 Other chondrocalcinosis, unspecified site: Secondary | ICD-10-CM | POA: Diagnosis not present

## 2017-10-11 MED ORDER — COLCHICINE 0.6 MG PO TABS: 0.6000 mg | ORAL_TABLET | Freq: Every day | ORAL | 2 refills | Status: DC

## 2017-10-11 NOTE — Progress Notes (Signed)
Sherri Gibson is a 57 y.o. female who presents to [facility] [place] today for left finger pain.   Loeta notes pain and swelling of the left hand 2nd digit at the DIP. She was seen in Urgent care on 10/10/17 where she was thought to have an infection at the fingertip or paronychia.  She was given intramuscular ceftriaxone 1 g injection and a prescription for oral doxycycline.  There was some concern that she may have an infection of the DIP joint itself or the possibly the tendon sheath and she was referred to my clinic today for evaluation.  She notes that she is not much better at all and perhaps slightly worse.  She has pain and swelling at the DIP.  She notes her finger is in a slightly flexed position.  She notes she has pain with extension and flexion.  No fevers chills nausea vomiting or diarrhea.  She cannot recall any injury or puncture wound.  She notes that she had a slight abrasion or irritation at the radial nail border prior to the onset of symptoms.   ROS as above:  Exam:  BP (!) 147/73   Pulse 76   Ht 4\' 11"  (1.499 m)   Wt 130 lb (59 kg)   BMI 26.26 kg/m  Wt Readings from Last 5 Encounters:  10/11/17 130 lb (59 kg)  10/10/17 130 lb (59 kg)  09/23/17 130 lb 11.2 oz (59.3 kg)    Gen: Well NAD HEENT: EOMI,  MMM Lungs: Normal work of breathing. CTABL Heart: RRR no MRG Abd: NABS, Soft. Nondistended, Nontender Exts: Brisk capillary refill, warm and well perfused.  Left hand: Left hand second digit erythematous and swollen at DIP.  DIP slightly flexed but patient does have the ability to extend her DIP against resistance and has normal strength to flexion and extension.  The DIP is tender to palpation especially at the radial side of the joint. Capillary refill and sensation is intact distally.  Pulses are intact at the wrist.  Hand is otherwise  normal-appearing  Lab and Radiology Results X-ray left second digit images personally independently reviewed. Calcified area located at the radial side of the digit at DIP consistent appearance with pseudogout or old avulsion fracture.  No donor site visible however.  No acute fractures.  X-rays otherwise unremarkable.   Assessment and Plan: 57 y.o. female with  Left second digit pain and swelling.  Patient does not have much of a history of trauma or injury to the skin to be concern for puncture wound or foreign body or joint infection.  However the digit is swollen and erythematous and tender.  She is had appropriate empiric antibiotic therapy starting yesterday with injectable IM ceftriaxone and oral doxycycline.  She is had 2 doses now doxycycline and is not improving.  With the x-ray findings suggestive of pseudogout I think is reasonable to also treat for pseudogout is a possibility.  Plan for continued oral antibiotics as I think that is reasonable as well as adding colchicine.  This should help with pain and swelling.  If not improving patient should return to clinic on Monday or present to the emergency room if worsening.  At the point the differential is somewhat uncertain as is the prognosis.    Of note labs and visits reviewed from [place].  Patient had an A1c of 6.5 in May indicating diabetes.  This was added to her problem list and  labs were will be abstracted from emergency room visit October 09, 2017 while his fasting lipids obtained May 2019.  Patient has a follow-up visit scheduled with her PCP on September 17.  Will personally notify Dr. Sheppard Coil of the findings.  Additionally patient had mammogram March 2019.  This result will also be sent to abstract.  Orders Placed This Encounter  Procedures  . DG Finger Index Left    Order Specific Question:   Reason for exam:    Answer:   eval pain and swelling DIP    Order Specific Question:   Is the patient pregnant?    Answer:   No      Order Specific Question:   Preferred imaging location?    Answer:   [facility]   Meds ordered this encounter  Medications  . colchicine 0.6 MG tablet    Sig: Take 1 tablet (0.6 mg total) by mouth daily.    Dispense:  30 tablet    Refill:  2    Capsule or tablets     Historical information moved to improve visibility of documentation.  Past Medical History:  Diagnosis Date  . Breast cancer ([place]) 2016   Left   . COPD (chronic obstructive pulmonary disease) ([place])   . High blood pressure   . High cholesterol    Past Surgical History:  Procedure Laterality Date  . BREAST LUMPECTOMY WITH AXILLARY LYMPH NODE BIOPSY  2016  . PARTIAL HYSTERECTOMY     Social History   Tobacco Use  . Smoking status: Former Smoker    Packs/day: 2.00    Years: 40.00    Pack years: 80.00  . Smokeless tobacco: Never Used  Substance Use Topics  . Alcohol use: Not Currently   family history includes Breast cancer in her mother; Diabetes in her brother.  Medications: Current Outpatient Medications  Medication Sig Dispense Refill  . albuterol (PROAIR HFA) 108 (90 Base) MCG/ACT inhaler inhale 2 puff by inhalation route  every 4 - 6 hours as needed    . albuterol (PROVENTIL) (2.5 MG/3ML) 0.083% nebulizer solution Take 3 mLs (2.5 mg total) by nebulization every 6 (six) hours as needed for Wheezing.    Marland Kitchen amLODipine (NORVASC) 5 MG tablet Take 1 tablet (5 mg total) by mouth daily. 90 tablet 3  . anastrozole (ARIMIDEX) 1 MG tablet TAKE 1 TABLET ONCE A DAY    . aspirin 81 MG chewable tablet Chew by mouth.    . budesonide-formoterol (SYMBICORT) 160-4.5 MCG/ACT inhaler Inhale into the lungs.    . cyclobenzaprine (FLEXERIL) 10 MG tablet Take by mouth.    . doxycycline (VIBRAMYCIN) 100 MG capsule Take 1 capsule (100 mg total) by mouth 2 (two) times daily. One po bid x 7 days 14 capsule 0  . meloxicam (MOBIC) 15 MG tablet Take 1 tablet (15 mg total) by mouth daily. 90 tablet 3  . montelukast  (SINGULAIR) 10 MG tablet TAKE 1 TABLET BY MOUTH EVERYDAY AT BEDTIME    . pravastatin (PRAVACHOL) 20 MG tablet Take 1 tablet (20 mg total) by mouth daily. 90 tablet 3  . Tiotropium Bromide Monohydrate 2.5 MCG/ACT AERS Inhale 1 puff into the lungs 2 (two) times daily. 2 Inhaler 99  . traZODone (DESYREL) 50 MG tablet TAKE 1 TABLET BY MOUTH EVERYDAY AT BEDTIME  0  . umeclidinium bromide (INCRUSE ELLIPTA) 62.5 MCG/INH AEPB Inhale 1 puff into the lungs daily. 30 each 11  . colchicine 0.6 MG tablet Take 1 tablet (0.6 mg total)  by mouth daily. 30 tablet 2   No current facility-administered medications for this visit.    Allergies  Allergen Reactions  . Codeine Itching    Feels like something crawling on her skin   . Tetanus Toxoids Other (See Comments)     Discussed warning signs or symptoms. Please see discharge instructions. Patient expresses understanding.

## 2017-10-11 NOTE — Patient Instructions (Addendum)
Thank you for coming in today. Call or go to the emergency room if you get worse, have trouble breathing, have chest pains, or palpitations.  Take colchicine daily.  Recheck early next week if not getting better. Continue antibiotics.    Calcium Pyrophosphate Deposition Calcium pyrophosphate deposition (CPPD), which is also called pseudogout, is a type of arthritis that causes pain, swelling, and inflammation in a joint. The joint pain can be severe and may last for days. If it is not treated, the pain may last much longer. Attacks of CPPD may come and go. This condition usually affects one joint at a time. The joints that are affected most commonly are the knees, but this condition can also affect the wrists, elbows, shoulders, or ankles. CPPD is similar to gout. Both conditions result from the buildup of crystals in the joint. However, CPPD is caused by a type of crystal that is different than the crystals that cause gout. What are the causes? This condition is caused by the buildup of calcium pyrophosphate dihydrate crystals in the joint. The reason why this buildup occurs is not known. The condition may be passed down from parent to child (hereditary). What increases the risk? This condition is more likely to develop in people who:  Are over 11 years old.  Have a family history of the condition.  Have had joint replacement surgery.  Have had a recent injury.  Have certain medical conditions, such as hemophilia, ochronosis, amyloidosis, or hormonal disorders.  Have low blood magnesium levels.  What are the signs or symptoms? Symptoms of this condition include:  Pain in a joint. The pain may: ? Be intense and constant. ? Come on quickly. ? Get worse with movement. ? Last from several days to a few weeks.  Redness, swelling, and warmth at the joint.  Stiffness of the joint.  How is this diagnosed? To diagnose this condition, your health care provider will use a needle to  remove fluid from the joint. The fluid will be examined under a microscope to check for the crystals that cause CPPD. You may also have imaging tests, such as:  X-rays.  Ultrasound.  How is this treated? There is no way to remove the crystals from the joint and no way to cure this condition. However, treatment can relieve symptoms and improve joint function. Treatment may include:  Nonsteroidal anti-inflammatory drugs (NSAIDs) to reduce inflammation and pain.  Medicines to help prevent attacks.  Injections of medicine (cortisone) into the joint to reduce pain and swelling.  Physical therapy to improve joint function.  Follow these instructions at home:  Take medicines only as directed by your health care provider.  Rest the affected joints until your symptoms start to go away.  Keep your affected joints raised (elevated) when possible. This will help to reduce swelling.  If directed, apply ice to the affected area: ? Put ice in a plastic bag. ? Place a towel between your skin and the bag. ? Leave the ice on for 20 minutes, 2-3 times per day.  If the painful joint is in your leg, use crutches as directed by your health care provider.  When your symptoms start to go away, begin to exercise regularly or do physical therapy. Talk with your health care provider or physical therapist about what types of exercise are safe for you. Low-impact exercise may be best. This includes walking, swimming, bicycling, and water aerobics.  Maintain a healthy weight so your joints do not need to bear  more weight than necessary. Contact a health care provider if:  You have an increase in joint pain that is not relieved with medicine.  Your joint becomes more red, swollen, or stiff.  You have a fever.  You have a skin rash. This information is not intended to replace advice given to you by your health care provider. Make sure you discuss any questions you have with your health care  provider. Document Released: 10/08/2003 Document Revised: 06/23/2015 Document Reviewed: 12/23/2013 Elsevier Interactive Patient Education  [employer].

## 2017-10-14 ENCOUNTER — Encounter: Payer: BLUE CROSS/BLUE SHIELD | Admitting: Physical Therapy

## 2017-10-14 ENCOUNTER — Encounter: Payer: Self-pay | Admitting: Osteopathic Medicine

## 2017-10-14 ENCOUNTER — Ambulatory Visit: Payer: BLUE CROSS/BLUE SHIELD | Admitting: Osteopathic Medicine

## 2017-10-14 ENCOUNTER — Emergency Department
Admission: EM | Admit: 2017-10-14 | Discharge: 2017-10-14 | Discharge status: Absent without leave | Payer: BLUE CROSS/BLUE SHIELD | Source: Home / Self Care

## 2017-10-14 VITALS — BP 154/89 | HR 86 | Temp 97.8°F | Wt 130.0 lb

## 2017-10-14 DIAGNOSIS — M79645 Pain in left finger(s): Secondary | ICD-10-CM | POA: Diagnosis not present

## 2017-10-14 DIAGNOSIS — I10 Essential (primary) hypertension: Secondary | ICD-10-CM | POA: Diagnosis not present

## 2017-10-14 MED ORDER — LOSARTAN POTASSIUM 50 MG PO TABS: 50.0000 mg | ORAL_TABLET | Freq: Every day | ORAL | 1 refills | Status: DC

## 2017-10-14 NOTE — Progress Notes (Signed)
HPI: Sherri Gibson is a 57 y.o. female who  has a past medical history of Breast cancer ([place]) (2016), COPD (chronic obstructive pulmonary disease) ([place]), High blood pressure, and High cholesterol.  she presents to [facility] today, 10/14/17,  for chief complaint of:  ER follow-up HTN  Left urgent care this morning for BP concern d/t "taking too long."  BP was 160/93 on her machine, ours was 154/89. BP has been running high at home. She takes her BP meds at night.  She took medications last night as usual.  When last seen by me, 09/23/2017, patient was supposed to follow-up for blood pressure recheck with the nurse but did not.  Last week, 10/09/2017, he was feeling jittery at home so she checked her blood pressure, BP was 160/95 at home, 165/95 about 30 mins later then 185/95 then went to ER.  improved to the systolic 540G without intervention.  Patient was discharged home to follow-up with me.  She has her hospital follow-up appointment tomorrow.   Also recently seen by a colleague for finger pain, was initially treated for cellulitis 10/10/2017, saw Dr. Georgina Snell 10/11/2017, she was not improving on antibiotics, started her on colchicine.  X-ray was suggestive for pseudogout.  Finger today is doing better but not totally resolved.  Abx were continued.      Past medical history, surgical history, and family history reviewed.  Current medication list and allergy/intolerance information reviewed.   (See remainder of HPI, ROS, Phys Exam below)  BP (!) 154/89 (BP Location: Left Arm, Patient Position: Sitting, Cuff Size: Normal)   Pulse 86   Temp 97.8 F (36.6 C) (Oral)   Wt 130 lb (59 kg)   BMI 26.26 kg/m   Dg Finger Index Left  Result Date: 10/11/2017 CLINICAL DATA:  Pain around the index finger DIP joint. EXAM: LEFT INDEX FINGER 2+V COMPARISON:  None. FINDINGS: Negative for fracture or dislocation involving the left index finger. There is concern for  soft tissue swelling throughout the index finger. Focal soft tissue calcifications along the radial aspect of the index finger DIP joint. No joint space narrowing at the DIP joint. IMPRESSION: Focal densities or calcifications along the radial aspect of the index finger DIP joint. No joint space loss or erosive changes in this area. Suspect this finding is posttraumatic change. Concern for soft tissue swelling in the index finger of unknown etiology. No acute bone abnormality. Electronically Signed   By: Markus Daft M.D.   On: 10/11/2017 15:38    No results found for this or any previous visit (from the past 72 hour(s)).   ASSESSMENT/PLAN:   Essential hypertension - Plan: Uric acid, COMPLETE METABOLIC PANEL WITH GFR  Pain of finger of left hand - Plan: Uric acid, COMPLETE METABOLIC PANEL WITH GFR   Meds ordered this encounter  Medications  . losartan (COZAAR) 50 MG tablet    Sig: Take 1 tablet (50 mg total) by mouth daily.    Dispense:  30 tablet    Refill:  1    Patient Instructions  Will start a medicine called losartan which will hopefully help blood pressure, take this in the evenings in addition to the amlodipine.  If your blood pressure at home continues to be greater than 140/90 after 3 days on the medication, please call and let me know.    Let's schedule a follow-up in the office for early next week. We will plan to get blood work next week. Please  make all follow-up appointments as directed anytime he seek care with anyone other than me.  Please be aware that while we were able to fit you in today, this will typically not be the case for walk-ins.   Follow-up plan: Return in about 1 week (around 10/21/2017) for BP recheck, labs.                        ############################################ ############################################ ############################################ ############################################    Outpatient Encounter  Medications as of 10/14/2017  Medication Sig  . albuterol (PROAIR HFA) 108 (90 Base) MCG/ACT inhaler inhale 2 puff by inhalation route  every 4 - 6 hours as needed  . albuterol (PROVENTIL) (2.5 MG/3ML) 0.083% nebulizer solution Take 3 mLs (2.5 mg total) by nebulization every 6 (six) hours as needed for Wheezing.  Marland Kitchen amLODipine (NORVASC) 5 MG tablet Take 1 tablet (5 mg total) by mouth daily.  Marland Kitchen anastrozole (ARIMIDEX) 1 MG tablet TAKE 1 TABLET ONCE A DAY  . aspirin 81 MG chewable tablet Chew by mouth.  . budesonide-formoterol (SYMBICORT) 160-4.5 MCG/ACT inhaler Inhale into the lungs.  . colchicine 0.6 MG tablet Take 1 tablet (0.6 mg total) by mouth daily.  . cyclobenzaprine (FLEXERIL) 10 MG tablet Take by mouth.  . doxycycline (VIBRAMYCIN) 100 MG capsule Take 1 capsule (100 mg total) by mouth 2 (two) times daily. One po bid x 7 days  . meloxicam (MOBIC) 15 MG tablet Take 1 tablet (15 mg total) by mouth daily.  . montelukast (SINGULAIR) 10 MG tablet TAKE 1 TABLET BY MOUTH EVERYDAY AT BEDTIME  . pravastatin (PRAVACHOL) 20 MG tablet Take 1 tablet (20 mg total) by mouth daily.  . Tiotropium Bromide Monohydrate 2.5 MCG/ACT AERS Inhale 1 puff into the lungs 2 (two) times daily.  . traZODone (DESYREL) 50 MG tablet TAKE 1 TABLET BY MOUTH EVERYDAY AT BEDTIME  . umeclidinium bromide (INCRUSE ELLIPTA) 62.5 MCG/INH AEPB Inhale 1 puff into the lungs daily.   No facility-administered encounter medications on file as of 10/14/2017.    Allergies  Allergen Reactions  . Codeine Itching    Feels like something crawling on her skin   . Tetanus Toxoids Other (See Comments)      Review of Systems:  Constitutional: No recent illness  HEENT: No  headache, no vision change  Cardiac: No  chest pain, No  pressure, No palpitations  Respiratory:  No  shortness of breath. No  Cough  Gastrointestinal: No  abdominal pain, no change on bowel habits  Musculoskeletal: No new myalgia/arthralgia  Skin: No   Rash  Neurologic: No  weakness, No  Dizziness  Exam:  BP (!) 154/89 (BP Location: Left Arm, Patient Position: Sitting, Cuff Size: Normal)   Pulse 86   Temp 97.8 F (36.6 C) (Oral)   Wt 130 lb (59 kg)   BMI 26.26 kg/m   Constitutional: VS see above. General Appearance: alert, well-developed, well-nourished, NAD  Eyes: Normal lids and conjunctive, non-icteric sclera  Ears, Nose, Mouth, Throat: MMM, Normal external inspection ears/nares/mouth/lips/gums.  Neck: No masses, trachea midline.   Respiratory: Normal respiratory effort. no wheeze, no rhonchi, no rales  Cardiovascular: S1/S2 normal, no murmur, no rub/gallop auscultated. RRR.   Musculoskeletal: Gait normal.   Neurological: Normal balance/coordination. No tremor.  Skin: warm, dry, intact.   Psychiatric: Normal judgment/insight. Normal mood and affect. Oriented x3.   Visit summary with medication list and pertinent instructions was printed for patient to review, advised to alert [name] if any changes  needed. All questions at time of visit were answered - patient instructed to contact office with any additional concerns. ER/RTC precautions were reviewed with the patient and understanding verbalized.   Follow-up plan: Return in about 1 week (around 10/21/2017) for BP recheck, labs.    Please note: voice recognition software was used to produce this document, and typos may escape review. Please contact Dr. Sheppard Coil for any needed clarifications.

## 2017-10-14 NOTE — Patient Instructions (Signed)
Will start a medicine called losartan which will hopefully help blood pressure, take this in the evenings in addition to the amlodipine.  If your blood pressure at home continues to be greater than 140/90 after 3 days on the medication, please call and let me know.    Let's schedule a follow-up in the office for early next week. We will plan to get blood work next week. Please make all follow-up appointments as directed anytime he seek care with anyone other than me.  Please be aware that while we were able to fit you in today, this will typically not be the case for walk-ins.

## 2017-10-15 ENCOUNTER — Inpatient Hospital Stay: Payer: BLUE CROSS/BLUE SHIELD | Admitting: Osteopathic Medicine

## 2017-10-17 ENCOUNTER — Encounter: Payer: Self-pay | Admitting: Pulmonary Disease

## 2017-10-17 ENCOUNTER — Telehealth: Payer: Self-pay | Admitting: Pulmonary Disease

## 2017-10-17 ENCOUNTER — Ambulatory Visit (INDEPENDENT_AMBULATORY_CARE_PROVIDER_SITE_OTHER): Payer: BLUE CROSS/BLUE SHIELD | Admitting: Pulmonary Disease

## 2017-10-17 VITALS — BP 122/82 | HR 95 | Ht 59.0 in | Wt 129.1 lb

## 2017-10-17 DIAGNOSIS — J432 Centrilobular emphysema: Secondary | ICD-10-CM | POA: Diagnosis not present

## 2017-10-17 DIAGNOSIS — J9611 Chronic respiratory failure with hypoxia: Secondary | ICD-10-CM | POA: Diagnosis not present

## 2017-10-17 DIAGNOSIS — R918 Other nonspecific abnormal finding of lung field: Secondary | ICD-10-CM

## 2017-10-17 DIAGNOSIS — J449 Chronic obstructive pulmonary disease, unspecified: Secondary | ICD-10-CM | POA: Diagnosis not present

## 2017-10-17 NOTE — Progress Notes (Addendum)
Synopsis: Referred in September 2019 for COPD She smoked for many years, 2 packs per day from age [age].  She quit with Chantix in 2018.   Subjective:   PATIENT ID: Sherri Gibson GENDER: female DOB: [birth year], MRN: [id number]   HPI  Chief Complaint  Patient presents with  . pulm consult    Pt was referred by Dr. Sheppard Coil MD for severe COPD. Pt uses 2L O2 on exertion. Pt quite smoking on Nov 29 2016; Pt has dry cough, slight SOB with exertion, and some wheezing.     She was diagnosed with COPD in 2010: > she thought she had a cold, then she thought she had the flu > she ended up in the ER and was told she had chronic bronchitis and emphysema > she has never been hospitalized  > she has frequent exacerbations in winter > her symptoms have improved since she quit smoking in 2018  > she says that when she climbs stairs carrying a vacuum cleaner she has dyspnea > if it is hot she feels worse > however in general her dyspnea is better than a year ago > cold weather doesn't bother her too much > she doesn't cough much now, much better now than when she smoked.   > she takes the Symbicort and Spiriva > she uses albuterol 1-2 times per week, sometimes not at all  Chronic respiratory failure with hypoxemia: > she was put on O2 in 2017 by her doctor in [place] when she had a "bad wheeze and rattle" > she has been on O2 since then  As child she had pneumonia "a lot", nearly every winter starting age 50.  She was not hospitalized as a child.  She smoked for many years, typically 2 packs per day from age [age].  She quit with Chantix in 2018.  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    Past Medical History:  Diagnosis Date  . Breast cancer ([place]) 2016   Left   . COPD (chronic obstructive pulmonary disease) ([place])   . High blood pressure   . High cholesterol      Family History  Problem Relation Age of Onset  . Breast cancer Mother   . Diabetes  Brother      Social History   Socioeconomic History  . Marital status: Married    Spouse name: Not on file  . Number of children: Not on file  . Years of education: Not on file  . Highest education level: Not on file  Occupational History  . Not on file  Social Needs  . Financial resource strain: Not on file  . Food insecurity:    Worry: Not on file    Inability: Not on file  . Transportation needs:    Medical: Not on file    Non-medical: Not on file  Tobacco Use  . Smoking status: Former Smoker    Packs/day: 2.00    Years: 40.00    Pack years: 80.00    Last attempt to quit: 11/29/2016    Years since quitting: 0.8  . Smokeless tobacco: Never Used  Substance and Sexual Activity  . Alcohol use: Not Currently  . Drug use: Never  . Sexual activity: Yes    Partners: Male    Birth control/protection: None  Lifestyle  . Physical activity:    Days per week: Not on file    Minutes per session: Not on file  . Stress:  Not on file  Relationships  . Social connections:    Talks on phone: Not on file    Gets together: Not on file    Attends religious service: Not on file    Active member of club or organization: Not on file    Attends meetings of clubs or organizations: Not on file    Relationship status: Not on file  . Intimate partner violence:    Fear of current or ex partner: Not on file    Emotionally abused: Not on file    Physically abused: Not on file    Forced sexual activity: Not on file  Other Topics Concern  . Not on file  Social History Narrative  . Not on file     Allergies  Allergen Reactions  . Codeine Itching    Feels like something crawling on her skin   . Tetanus Toxoids Other (See Comments)     Outpatient Medications Prior to Visit  Medication Sig Dispense Refill  . albuterol (PROAIR HFA) 108 (90 Base) MCG/ACT inhaler inhale 2 puff by inhalation route  every 4 - 6 hours as needed    . albuterol (PROVENTIL) (2.5 MG/3ML) 0.083% nebulizer  solution Take 3 mLs (2.5 mg total) by nebulization every 6 (six) hours as needed for Wheezing.    Marland Kitchen amLODipine (NORVASC) 5 MG tablet Take 1 tablet (5 mg total) by mouth daily. 90 tablet 3  . anastrozole (ARIMIDEX) 1 MG tablet TAKE 1 TABLET ONCE A DAY    . aspirin 81 MG chewable tablet Chew by mouth.    . budesonide-formoterol (SYMBICORT) 160-4.5 MCG/ACT inhaler Inhale into the lungs.    . colchicine 0.6 MG tablet Take 1 tablet (0.6 mg total) by mouth daily. 30 tablet 2  . cyclobenzaprine (FLEXERIL) 10 MG tablet Take by mouth.    . doxycycline (VIBRAMYCIN) 100 MG capsule Take 1 capsule (100 mg total) by mouth 2 (two) times daily. One po bid x 7 days 14 capsule 0  . losartan (COZAAR) 50 MG tablet Take 1 tablet (50 mg total) by mouth daily. 30 tablet 1  . montelukast (SINGULAIR) 10 MG tablet TAKE 1 TABLET BY MOUTH EVERYDAY AT BEDTIME    . pravastatin (PRAVACHOL) 20 MG tablet Take 1 tablet (20 mg total) by mouth daily. 90 tablet 3  . Tiotropium Bromide Monohydrate 2.5 MCG/ACT AERS Inhale 1 puff into the lungs 2 (two) times daily. 2 Inhaler 99  . traZODone (DESYREL) 50 MG tablet TAKE 1 TABLET BY MOUTH EVERYDAY AT BEDTIME  0  . meloxicam (MOBIC) 15 MG tablet Take 1 tablet (15 mg total) by mouth daily. (Patient not taking: Reported on 10/17/2017) 90 tablet 3  . umeclidinium bromide (INCRUSE ELLIPTA) 62.5 MCG/INH AEPB Inhale 1 puff into the lungs daily. (Patient not taking: Reported on 10/17/2017) 30 each 11   No facility-administered medications prior to visit.     Review of Systems  Constitutional: Negative for chills, fever, malaise/fatigue and weight loss.  HENT: Positive for nosebleeds. Negative for congestion, sinus pain and sore throat.   Eyes: Negative for photophobia, pain and discharge.  Respiratory: Positive for cough, shortness of breath and wheezing. Negative for hemoptysis and sputum production.   Cardiovascular: Positive for palpitations. Negative for chest pain, orthopnea and leg  swelling.  Gastrointestinal: Negative for abdominal pain, constipation, diarrhea, nausea and vomiting.  Genitourinary: Negative for dysuria, frequency, hematuria and urgency.  Musculoskeletal: Negative for back pain, joint pain, myalgias and neck pain.  Skin: Negative for itching  and rash.  Neurological: Positive for headaches. Negative for tingling, tremors, sensory change, speech change, focal weakness, seizures and weakness.  Endo/Heme/Allergies: Does not bruise/bleed easily.  Psychiatric/Behavioral: Positive for depression. Negative for memory loss, substance abuse and suicidal ideas. The patient is nervous/anxious.       Objective:  Physical Exam   Vitals:   10/17/17 1423  BP: 122/82  Pulse: 95  SpO2: 97%  Weight: 129 lb 1.8 oz (58.6 kg)  Height: 4\' 11"  (1.499 m)   RA  Gen: chronically ill appearing, no acute distress HENT: NCAT, OP clear, neck supple without masses Eyes: PERRL, EOMi Lymph: no cervical lymphadenopathy PULM: CTA B CV: RRR, no mgr, no JVD GI: BS+, soft, nontender, no hsm Derm: no rash or skin breakdown MSK: normal bulk and tone Neuro: A&Ox4, CN II-XII intact, strength 5/5 in all 4 extremities Psyche: normal mood and affect   CBC No results found for: WBC, RBC, HGB, HCT, PLT, MCV, MCH, MCHC, RDW, LYMPHSABS, MONOABS, EOSABS, BASOSABS   Chest imaging: October 2017 CT chest [facility] multiple calcified granulomatous largest is 8 mm, 2 noncalcified 3 mm left lower lobe nodules 11/2016 CT chest 6-54mm nodules Right upper an lower nodules (per patient this had been seen in [place] knew about this as well).    PFT: 2017 PFT from [facility] Ratio 53% FEV1 1.03 L 45% predicted, no air trapping, diffusion capacity 35% predicted  Labs:  Path:  Echo:  Heart Catheterization:  Records from her visit with pulmonary in [facility] from earlier this year reviewed where the patient was seen for COPD.  A sleep study was arranged but she  never followed up with this.  She was treated for chronic respiratory failure with hypoxemia and COPD.     Assessment & Plan:   COPD, severe ([place])  Pulmonary nodules  Centrilobular emphysema ([place])  Chronic respiratory failure with hypoxia ([place])  Discussion: This is a pleasant 57 year old female who has a past medical history significant for COPD and chronic respiratory failure with hypoxemia who comes to my clinic today to establish care for the same.  She has an extensive smoking history and is at increased risk for lung cancer.  She has undergone lung cancer screening as recent as November 2018 which showed a pulmonary nodule.  She tells me that this nodule had previously been seen in [place] in 2018.  At this time I see no reason to change her medication regimen.  Plan: COPD: We will request records from Dr. Prince Rome office in [place] for office notes, lung function testing, and radiology reports Continue Symbicort Continue Spiriva Continue using albuterol as needed for chest tightness wheezing or shortness of breath I am glad he had a flu shot Stay active Exercise regularly Wash her hands frequently I am glad you have had a flu shot  Chronic respiratory failure with hypoxemia: Keep using 2 L of oxygen continuously  Pulmonary nodule: We will arrange for records from [facility] to be sent.  I think the best approach to monitor this is for you to have annual lung cancer screening CT scans.  For this reason we will recommend that you connect with our lung cancer screening team, but we will await to make that recommendation until we have reviewed the records from [place].  We will see you back in 6 months or sooner if needed      Current Outpatient Medications:  .  albuterol (PROAIR HFA) 108 (90 Base) MCG/ACT inhaler, inhale 2 puff by  inhalation route  every 4 - 6 hours as needed, Disp: , Rfl:  .  albuterol (PROVENTIL) (2.5 MG/3ML) 0.083% nebulizer solution, Take 3 mLs  (2.5 mg total) by nebulization every 6 (six) hours as needed for Wheezing., Disp: , Rfl:  .  amLODipine (NORVASC) 5 MG tablet, Take 1 tablet (5 mg total) by mouth daily., Disp: 90 tablet, Rfl: 3 .  anastrozole (ARIMIDEX) 1 MG tablet, TAKE 1 TABLET ONCE A DAY, Disp: , Rfl:  .  aspirin 81 MG chewable tablet, Chew by mouth., Disp: , Rfl:  .  budesonide-formoterol (SYMBICORT) 160-4.5 MCG/ACT inhaler, Inhale into the lungs., Disp: , Rfl:  .  colchicine 0.6 MG tablet, Take 1 tablet (0.6 mg total) by mouth daily., Disp: 30 tablet, Rfl: 2 .  cyclobenzaprine (FLEXERIL) 10 MG tablet, Take by mouth., Disp: , Rfl:  .  doxycycline (VIBRAMYCIN) 100 MG capsule, Take 1 capsule (100 mg total) by mouth 2 (two) times daily. One po bid x 7 days, Disp: 14 capsule, Rfl: 0 .  losartan (COZAAR) 50 MG tablet, Take 1 tablet (50 mg total) by mouth daily., Disp: 30 tablet, Rfl: 1 .  montelukast (SINGULAIR) 10 MG tablet, TAKE 1 TABLET BY MOUTH EVERYDAY AT BEDTIME, Disp: , Rfl:  .  pravastatin (PRAVACHOL) 20 MG tablet, Take 1 tablet (20 mg total) by mouth daily., Disp: 90 tablet, Rfl: 3 .  Tiotropium Bromide Monohydrate 2.5 MCG/ACT AERS, Inhale 1 puff into the lungs 2 (two) times daily., Disp: 2 Inhaler, Rfl: 99 .  traZODone (DESYREL) 50 MG tablet, TAKE 1 TABLET BY MOUTH EVERYDAY AT BEDTIME, Disp: , Rfl: 0 .  meloxicam (MOBIC) 15 MG tablet, Take 1 tablet (15 mg total) by mouth daily. (Patient not taking: Reported on 10/17/2017), Disp: 90 tablet, Rfl: 3 .  umeclidinium bromide (INCRUSE ELLIPTA) 62.5 MCG/INH AEPB, Inhale 1 puff into the lungs daily. (Patient not taking: Reported on 10/17/2017), Disp: 30 each, Rfl: 11

## 2017-10-17 NOTE — Telephone Encounter (Signed)
Faxed medical records release forms to Dr. Perrin Smack MD office today Phone [phone], fax [phone] rec'd confirmation fax went through, waiting records for imaging, PFT, and all OV notes for BQ  Routing message to Burman Nieves to f/u with next week.

## 2017-10-17 NOTE — Patient Instructions (Signed)
COPD: We will request records from Dr. Prince Rome office in [place] for office notes, lung function testing, and radiology reports Continue Symbicort Continue Spiriva Continue using albuterol as needed for chest tightness wheezing or shortness of breath I am glad he had a flu shot Stay active Exercise regularly Wash her hands frequently I am glad you have had a flu shot  Chronic respiratory failure with hypoxemia: Keep using 2 L of oxygen continuously  Pulmonary nodule: We will arrange for records from [facility] to be sent.  I think the best approach to monitor this is for you to have annual lung cancer screening CT scans.  For this reason we will recommend that you connect with our lung cancer screening team, but we will await to make that recommendation until we have reviewed the records from [place].  We will see you back in 6 months or sooner if needed

## 2017-10-17 NOTE — Telephone Encounter (Signed)
PA request rec'd today 10-17-17 for Symbicort 160 inhaler for 90 days supply PA initiated via [email or web address] Key: [id number]   Waiting to be processed and reviewed can take up to 72 hours.  Routing message to Burman Nieves to f/u on with patient later next week.

## 2017-10-18 ENCOUNTER — Ambulatory Visit: Payer: BLUE CROSS/BLUE SHIELD | Admitting: Physical Therapy

## 2017-10-18 VITALS — BP 125/78 | HR 69

## 2017-10-18 DIAGNOSIS — M25611 Stiffness of right shoulder, not elsewhere classified: Secondary | ICD-10-CM

## 2017-10-18 DIAGNOSIS — M6281 Muscle weakness (generalized): Secondary | ICD-10-CM

## 2017-10-18 DIAGNOSIS — M542 Cervicalgia: Secondary | ICD-10-CM | POA: Diagnosis not present

## 2017-10-18 DIAGNOSIS — R293 Abnormal posture: Secondary | ICD-10-CM

## 2017-10-18 NOTE — Therapy (Addendum)
[place], [place], [address] Phone: [phone]   Fax:  [phone]  Physical Therapy Treatment/Discharge  Patient Details  Name: Sherri Gibson MRN: [id number] Date of Birth: 1960-06-14 Referring Provider: Emeterio Reeve, DO   Encounter Date: 10/18/2017  PT End of Session - 10/18/17 0808    Visit Number  5    Number of Visits  12    Date for PT Re-Evaluation  11/06/17    Authorization Type  [employer] $35 copay (MVC)    PT Start Time  0803    PT Stop Time  0845    PT Time Calculation (min)  42 min       Past Medical History:  Diagnosis Date  . Breast cancer ([place]) 2016   Left   . COPD (chronic obstructive pulmonary disease) ([place])   . High blood pressure   . High cholesterol     Past Surgical History:  Procedure Laterality Date  . BREAST LUMPECTOMY WITH AXILLARY LYMPH NODE BIOPSY  2016  . PARTIAL HYSTERECTOMY      Vitals:   10/18/17 0808  BP: 125/78  Pulse: 69    Subjective Assessment - 10/18/17 0808    Subjective  Sherri Gibson reports she has had a lot going on in her life.  She is traveling to see her brother in [place] tomorrow (who is terminally ill) and having surgery on her hand on 9/30.  She is now sleeping 4 hr increments.  She feels like she can move her Rt shoulder a little better now.  She is interested in continuation of therapy after she is cleared to drive after her hand therapy.     Patient Stated Goals  improve pain and motion, be able to sleep better    Currently in Pain?  Yes    Pain Score  8     Pain Location  Neck    Pain Orientation  Right;Left    Pain Descriptors / Indicators  Sharp    Aggravating Factors   turning head in any direction     Pain Relieving Factors  heat, TENS, muscle relaxer.     Multiple Pain Sites  Yes    Pain Score  4    Pain Location  Shoulder    Pain Orientation  Right    Pain Descriptors / Indicators  Tightness;Dull    Aggravating Factors    lifting heavy things, reaching overhead.    Pain Relieving Factors  heat          [facility] PT Assessment - 10/18/17 0001      Observation/Other Assessments   Focus on Therapeutic Outcomes (FOTO)   56% limited      AROM   AROM Assessment Site  Shoulder;Cervical    Right/Left Shoulder  Right    Right Shoulder Flexion  115 Degrees    Right Shoulder ABduction  125 Degrees    Cervical Flexion  10    Cervical Extension  5    Cervical - Right Side Bend  7    Cervical - Left Side Bend  5    Cervical - Right Rotation  20    Cervical - Left Rotation  15                   [facility] Adult PT Treatment/Exercise - 10/18/17 0001      Neck Exercises: Seated   Other Seated Exercise  pulleys in flexion x 2 min  Other Seated Exercise  shoulder rolls x 5 reps.       Neck Exercises: Supine   Cervical Rotation  Right;Left;5 reps   with nods, to tolerance   Other Supine Exercise  prolonged snow angel (arms ~~70 deg) x 5 reps, then 5 active ones to tolerance    Other Supine Exercise  overhead reach with hands in yoga strap x 6 reps;  hands laced horiz abdct/add;  scap retraction with W's x 3 sec hold x 10 reps      Modalities   Modalities  --   pt declined; has time restraints.      Moist Heat Therapy   Number Minutes Moist Heat  --   used during supine exercise.      Manual Therapy   Manual Therapy  Joint mobilization;Soft tissue mobilization;Passive ROM;Myofascial release    Manual therapy comments  pt in supported supine with heat on cervcial/thoracic spine.     Joint Mobilization  Rt scap mobs in all directions     Soft tissue mobilization  STM to bilat cervical paraspinals, upper trap and levator, Rt rhomboid and thoracic paraspinals.     Myofascial Release  MFR to bilat platysma, Rt scalene, bilat suboccipital release.     Passive ROM  attempts for cervical rotation or lateral flexion in supine; very guarded.                   PT Long Term Goals - 10/04/17 1256       PT LONG TERM GOAL #1   Title  independent with HEP    Status  On-going      PT LONG TERM GOAL #2   Title  improve cervical AROM to at least 20 degrees all motions for improved mobility    Status  On-going      PT LONG TERM GOAL #3   Title  report pain < 5/10 with ADLs for improved function and pain    Status  On-going      PT LONG TERM GOAL #4   Title  report 75% improvement in sleep for improved pain    Status  On-going      PT LONG TERM GOAL #5   Title  FOTO score improved to < 45% limitation for improved functional mobility    Status  On-going            Plan - 10/18/17 0959    Clinical Impression Statement  Pt reporting improved function and use of her Rt arm with less pain today.  Pt continues to have very limited (and guarded) cervical ROM.  Pt's FOTO score has improved to 56% limited.  She has resumed her walking program on her treadmill at 20 min / day.  Pt is making gradual gains towards goals and will have limited ability to attend therapy due to travel and upcoming surgery.      Rehab Potential  Good    PT Frequency  2x / week    PT Duration  6 weeks    PT Treatment/Interventions  ADLs/Self Care Home Management;Cryotherapy;Electrical Stimulation;Moist Heat;Traction;Therapeutic exercise;Therapeutic activities;Ultrasound;Neuromuscular re-education;Patient/family education;Manual techniques;Taping;Dry needling;Passive range of motion    PT Next Visit Plan  spoke to supervising PT regarding pt's progress.  will continue progressive neck/shoulder ROM and strengthening as tolerated upon her return.     Consulted and Agree with Plan of Care  Patient       Patient will benefit from skilled therapeutic intervention in order to improve the  following deficits and impairments:  Increased fascial restricitons, Increased muscle spasms, Decreased strength, Pain, Impaired UE functional use, Decreased range of motion, Postural dysfunction, Impaired flexibility  Visit  Diagnosis: Cervicalgia  Muscle weakness (generalized)  Stiffness of right shoulder, not elsewhere classified  Abnormal posture     Problem List Patient Active Problem List   Diagnosis Date Noted  . Diabetes mellitus without complication ([facility]) 32/76/1470  . Prediabetes 09/23/2017  . Colonoscopy refused 05/29/2017  . COPD, severe ([facility]) 05/29/2017  . Pulmonary nodules 05/29/2017  . Trigger thumb, right thumb 05/29/2017  . Primary osteoarthritis of first carpometacarpal joint of left hand 05/29/2017  . Kidney lesion 12/14/2016  . Erythrocytosis 11/13/2016  . Breast cancer ([facility]) 09/20/2016  . Essential hypertension 09/20/2016  . Insomnia 09/20/2016  . Mixed hyperlipidemia 09/20/2016  . Pulmonary emphysema ([facility]) 09/20/2016   Kerin Perna, PTA 10/18/17 10:07 AM  [place], [place], [address] Phone: [phone]   Fax:  [phone]  Name: Sherri Gibson MRN: [id number] Date of Birth: 26-Jun-1960      PHYSICAL THERAPY DISCHARGE SUMMARY  Visits from Start of Care: 5  Current functional level related to goals / functional outcomes: See above   Remaining deficits: See above; pt asked to hold due to upcoming surgery but did not return   Education / Equipment: HEP  Plan: Patient agrees to discharge.  Patient goals were not met. Patient is being discharged due to not returning since the last visit.  ?????    Laureen Abrahams, PT, DPT 11/27/17 1:40 PM  [facility] [address] [place], [place] [address]  [phone] (office) [phone] (fax)

## 2017-10-18 NOTE — Telephone Encounter (Signed)
I checked covermymeds and the PA for Symbicort was cancelled because this medication did not require a PA. I called the pt and she stated the Spiriva 2.5  needed a PA. I started PA on CMM and will to await response from [facility].  Tanja Port (Key: [id number])    [facility] is reviewing your PA request and will respond within 24 hours for Medicaid or up to 72 hours for non-Medicaid plans, based on the required timeframe determined by state or federal regulations. To check for an update later, open this request from your dashboard.

## 2017-10-18 NOTE — Telephone Encounter (Signed)
Records have been received and placed in Dr. Anastasia Pall look at folder. Nothing further needed.

## 2017-10-21 ENCOUNTER — Inpatient Hospital Stay: Payer: BLUE CROSS/BLUE SHIELD | Admitting: Osteopathic Medicine

## 2017-10-21 ENCOUNTER — Encounter: Payer: BLUE CROSS/BLUE SHIELD | Admitting: Physical Therapy

## 2017-10-23 ENCOUNTER — Encounter: Payer: BLUE CROSS/BLUE SHIELD | Admitting: Physical Therapy

## 2017-10-23 NOTE — Telephone Encounter (Signed)
Tanja Port (Key: [id number])   This request has been approved.      Nothing further needed.

## 2017-10-26 DIAGNOSIS — J449 Chronic obstructive pulmonary disease, unspecified: Secondary | ICD-10-CM | POA: Diagnosis not present

## 2017-11-01 ENCOUNTER — Encounter: Payer: Self-pay | Admitting: Osteopathic Medicine

## 2017-11-01 ENCOUNTER — Other Ambulatory Visit: Payer: Self-pay

## 2017-11-01 ENCOUNTER — Other Ambulatory Visit: Payer: Self-pay | Admitting: Osteopathic Medicine

## 2017-11-01 ENCOUNTER — Telehealth: Payer: Self-pay

## 2017-11-01 MED ORDER — LOSARTAN POTASSIUM 50 MG PO TABS: 50.0000 mg | ORAL_TABLET | Freq: Every day | ORAL | 0 refills | Status: DC

## 2017-11-01 NOTE — Telephone Encounter (Signed)
Requested medication (s) are due for refill today: yes start date on med 07/01/17. No info on # dispensed  Requested medication (s) are on the active medication list: yes but it it is a historical med  Last refill:  "start date" 07/01/17  Future visit scheduled: yes 12/13/17  Notes to clinic:  [facility] unable to fill trazodone   Requested Prescriptions  Pending Prescriptions Disp Refills   traZODone (DESYREL) 50 MG tablet [Pharmacy Med Name: TRAZODONE 50 MG TABLET] 90 tablet 0    Sig: TAKE 1 TABLET BY MOUTH EVERYDAY AT BEDTIME     There is no refill protocol information for this order

## 2017-11-01 NOTE — Telephone Encounter (Signed)
[facility] requesting med refill for montelukast. Rx written by historical provider.

## 2017-11-04 MED ORDER — MONTELUKAST SODIUM 10 MG PO TABS: 10.0000 mg | ORAL_TABLET | Freq: Every day | ORAL | 3 refills | Status: DC

## 2017-11-04 NOTE — Telephone Encounter (Signed)
Pt has been updated. No other inquiries during call.  

## 2017-11-04 NOTE — Telephone Encounter (Signed)
Sent to [facility].

## 2017-11-06 ENCOUNTER — Telehealth: Payer: Self-pay | Admitting: Pulmonary Disease

## 2017-11-06 MED ORDER — TIOTROPIUM BROMIDE MONOHYDRATE 2.5 MCG/ACT IN AERS: 1.0000 | INHALATION_SPRAY | Freq: Two times a day (BID) | RESPIRATORY_TRACT | 3 refills | Status: DC

## 2017-11-06 NOTE — Telephone Encounter (Signed)
Message from Plan from [employer]: CaseId:[id number];Status:Approved;Review Type:Prior Auth;Coverage Start Date:09/18/2017;Coverage End Date:10/18/2018;  Called and spoke with pt letting her know the PA was approved. Per pt, [facility] was needing a new Rx to be sent on the spiriva.  Stated to pt that I would send a Rx to [facility] for her. Pt expressed understanding. Nothing further needed.

## 2017-11-08 ENCOUNTER — Encounter: Payer: Self-pay | Admitting: Osteopathic Medicine

## 2017-11-08 LAB — CHLORIDE
Albumin: 4.2
CO2: 22
Calcium: 9.2
Chloride: 103
EGFR (Non-African Amer.): 100
GLOBULIN: 2.9
Total Protein: 7.1

## 2017-11-15 ENCOUNTER — Telehealth: Payer: Self-pay | Admitting: Pulmonary Disease

## 2017-11-15 NOTE — Telephone Encounter (Signed)
Patient stopped by the [employer] office yesterday and requested to have a handicap placard application signed by BQ. She has requested to have this mailed to her.   BQ has signed the application and the patient is aware. I verified her address. Nothing further needed at time of call.

## 2017-11-15 NOTE — Telephone Encounter (Signed)
error 

## 2017-11-25 DIAGNOSIS — J449 Chronic obstructive pulmonary disease, unspecified: Secondary | ICD-10-CM | POA: Diagnosis not present

## 2017-11-29 ENCOUNTER — Encounter: Payer: Self-pay | Admitting: Nurse Practitioner

## 2017-11-29 ENCOUNTER — Ambulatory Visit: Payer: BLUE CROSS/BLUE SHIELD | Admitting: Nurse Practitioner

## 2017-11-29 VITALS — BP 122/78 | HR 82 | Ht 59.0 in | Wt 134.0 lb

## 2017-11-29 DIAGNOSIS — J0100 Acute maxillary sinusitis, unspecified: Secondary | ICD-10-CM | POA: Diagnosis not present

## 2017-11-29 MED ORDER — AMOXICILLIN-POT CLAVULANATE 875-125 MG PO TABS: 1.0000 | ORAL_TABLET | Freq: Two times a day (BID) | ORAL | 0 refills | Status: DC

## 2017-11-29 MED ORDER — PREDNISONE 10 MG PO TABS: ORAL_TABLET | ORAL | 0 refills | Status: DC

## 2017-11-29 NOTE — Assessment & Plan Note (Signed)
Patient Instructions  Will order Augmentin Will order prednisone taper May take mucinex daily   General discussion: Hydrate  Drink enough water to keep your pee (urine) clear or pale yellow. Rest  Rest as much as possible.  Sleep with your head raised (elevated).  Make sure to get enough sleep each night. General instructions  Put a warm, moist washcloth on your face 3-4 times a day or as told by your doctor. This will help with discomfort.  Wash your hands often with soap and water. If there is no soap and water, use hand sanitizer.  Do not smoke. Avoid being around people who are smoking (secondhand smoke). Call the office if:  You have a fever.  Your symptoms get worse.  Your symptoms do not get better within 10 days.  Follow up with Dr. Lake Bells as scheduled

## 2017-11-29 NOTE — Patient Instructions (Addendum)
Will order Augmentin Will order prednisone taper May take mucinex daily   General discussion: Hydrate  Drink enough water to keep your pee (urine) clear or pale yellow. Rest  Rest as much as possible.  Sleep with your head raised (elevated).  Make sure to get enough sleep each night. General instructions  Put a warm, moist washcloth on your face 3-4 times a day or as told by your doctor. This will help with discomfort.  Wash your hands often with soap and water. If there is no soap and water, use hand sanitizer.  Do not smoke. Avoid being around people who are smoking (secondhand smoke). Call the office if:  You have a fever.  Your symptoms get worse.  Your symptoms do not get better within 10 days.  Follow up with Dr. Lake Bells as scheduled

## 2017-11-29 NOTE — Progress Notes (Signed)
Reviewed, agree 

## 2017-11-29 NOTE — Progress Notes (Signed)
@Patient  ID: Sherri Gibson, female    DOB: Apr 18, 1960, 57 y.o.   MRN: [id number]  Chief Complaint  Patient presents with  . head congestion    Referring provider: Emeterio Reeve, DO  Synopsis: Referred in September 2019 for COPD She smoked for many years, 2 packs per day from age [age].  She quit with Chantix in 2018.   HPI 57 year old female with COPD followed by Sherri Gibson  Tests: Chest imaging: October 2017 CT chest [place] multiple calcified granulomatous largest is 8 mm, 2 noncalcified 3 mm left lower lobe nodules 11/2016 CT chest 6-54mm nodules Right upper an lower nodules (per patient this had been seen in [place] knew about this as well).    PFT: 2017 PFT from [facility] Ratio 53% FEV1 1.03 L 45% predicted, no air trapping, diffusion capacity 35% predicted  OV 11/29/17 - acute sinus congestion Patient presents with sinus congestion, pressure, and pain. States that symptoms started 3 days ago. Congestion is worse at night. Patient has quit smoking. States that her husband has been sick with similar symptoms as well. Denies any chest congestion. Denies any shortness of breath, fever, or chest pain.     Allergies  Allergen Reactions  . Codeine Itching    Feels like something crawling on her skin   . Tetanus Toxoids Other (See Comments)    Immunization History  Administered Date(s) Administered  . Influenza, Seasonal, Injecte, Preservative Fre 10/15/2016  . Influenza,inj,Quad PF,6+ Mos 09/23/2017  . Pneumococcal Polysaccharide-23 05/29/2017  . Tdap 05/29/2017    Past Medical History:  Diagnosis Date  . Breast cancer ([facility]) 2016   Left   . COPD (chronic obstructive pulmonary disease) ([facility])   . High blood pressure   . High cholesterol     Tobacco History: Social History   Tobacco Use  Smoking Status Former Smoker  . Packs/day: 2.00  . Years: 40.00  . Pack years: 80.00  . Last attempt to quit: 11/29/2016  . Years  since quitting: 1.0  Smokeless Tobacco Never Used   Counseling given: Not Answered   Outpatient Encounter Medications as of 11/29/2017  Medication Sig  . albuterol (PROAIR HFA) 108 (90 Base) MCG/ACT inhaler inhale 2 puff by inhalation route  every 4 - 6 hours as needed  . albuterol (PROVENTIL) (2.5 MG/3ML) 0.083% nebulizer solution Take 3 mLs (2.5 mg total) by nebulization every 6 (six) hours as needed for Wheezing.  Marland Kitchen amLODipine (NORVASC) 5 MG tablet Take 1 tablet (5 mg total) by mouth daily.  Marland Kitchen anastrozole (ARIMIDEX) 1 MG tablet TAKE 1 TABLET ONCE A DAY  . aspirin 81 MG chewable tablet Chew by mouth.  . budesonide-formoterol (SYMBICORT) 160-4.5 MCG/ACT inhaler Inhale into the lungs.  . colchicine 0.6 MG tablet Take 1 tablet (0.6 mg total) by mouth daily.  . cyclobenzaprine (FLEXERIL) 10 MG tablet Take by mouth.  . losartan (COZAAR) 50 MG tablet Take 1 tablet (50 mg total) by mouth daily.  . meloxicam (MOBIC) 15 MG tablet Take 1 tablet (15 mg total) by mouth daily.  . montelukast (SINGULAIR) 10 MG tablet Take 1 tablet (10 mg total) by mouth at bedtime.  . pravastatin (PRAVACHOL) 20 MG tablet Take 1 tablet (20 mg total) by mouth daily.  . Tiotropium Bromide Monohydrate 2.5 MCG/ACT AERS Inhale 1 puff into the lungs 2 (two) times daily.  . traZODone (DESYREL) 50 MG tablet TAKE 1 TABLET BY MOUTH EVERYDAY AT BEDTIME  . amoxicillin-clavulanate (AUGMENTIN) 875-125 MG  tablet Take 1 tablet by mouth 2 (two) times daily.  . predniSONE (DELTASONE) 10 MG tablet Take 3 tabs for 2 days, then 2 tabs for 2 days, then 1 tab for 2 days, then stop  . [DISCONTINUED] amoxicillin-clavulanate (AUGMENTIN) 875-125 MG tablet Take 1 tablet by mouth 2 (two) times daily.  . [DISCONTINUED] doxycycline (VIBRAMYCIN) 100 MG capsule Take 1 capsule (100 mg total) by mouth 2 (two) times daily. One po bid x 7 days (Patient not taking: Reported on 11/29/2017)  . [DISCONTINUED] predniSONE (DELTASONE) 10 MG tablet Take 3 tabs for 2  days, then 2 tabs for 2 days, then 1 tab for 2 days, then stop   No facility-administered encounter medications on file as of 11/29/2017.      Review of Systems  Review of Systems  Constitutional: Negative.  Negative for chills and fever.  HENT: Positive for congestion, postnasal drip, sinus pressure and sinus pain.   Respiratory: Negative for cough, shortness of breath and wheezing.   Cardiovascular: Negative.  Negative for chest pain and leg swelling.  Gastrointestinal: Negative.   Allergic/Immunologic: Negative.   Neurological: Negative.   Psychiatric/Behavioral: Negative.        Physical Exam  BP 122/78 (BP Location: Right Arm, Patient Position: Sitting, Cuff Size: Normal)   Pulse 82   Ht 4\' 11"  (1.499 m)   Wt 134 lb (60.8 kg)   SpO2 100% Comment: on 2L of pulse O2  BMI 27.06 kg/m   Wt Readings from Last 5 Encounters:  11/29/17 134 lb (60.8 kg)  10/17/17 129 lb 1.8 oz (58.6 kg)  10/14/17 130 lb (59 kg)  10/11/17 130 lb (59 kg)  10/10/17 130 lb (59 kg)     Physical Exam  Constitutional: She is oriented to person, place, and time. She appears well-developed and well-nourished. No distress.  HENT:  Nose: Right sinus exhibits maxillary sinus tenderness and frontal sinus tenderness. Left sinus exhibits maxillary sinus tenderness and frontal sinus tenderness.  Cardiovascular: Normal rate and regular rhythm.  Pulmonary/Chest: Effort normal and breath sounds normal.  Neurological: She is alert and oriented to person, place, and time.  Psychiatric: She has a normal mood and affect.  Nursing note and vitals reviewed.     Assessment & Plan:   Acute non-recurrent maxillary sinusitis Patient Instructions  Will order Augmentin Will order prednisone taper May take mucinex daily   General discussion: Hydrate  Drink enough water to keep your pee (urine) clear or pale yellow. Rest  Rest as much as possible.  Sleep with your head raised (elevated).  Make sure  to get enough sleep each night. General instructions  Put a warm, moist washcloth on your face 3-4 times a day or as told by your doctor. This will help with discomfort.  Wash your hands often with soap and water. If there is no soap and water, use hand sanitizer.  Do not smoke. Avoid being around people who are smoking (secondhand smoke). Call the office if:  You have a fever.  Your symptoms get worse.  Your symptoms do not get better within 10 days.  Follow up with Sherri Gibson as scheduled       Fenton Foy, NP 11/29/2017

## 2017-12-03 ENCOUNTER — Ambulatory Visit: Payer: BLUE CROSS/BLUE SHIELD | Admitting: Osteopathic Medicine

## 2017-12-03 ENCOUNTER — Ambulatory Visit (HOSPITAL_BASED_OUTPATIENT_CLINIC_OR_DEPARTMENT_OTHER)
Admission: RE | Admit: 2017-12-03 | Discharge: 2017-12-03 | Disposition: A | Discharge status: Home / Self Care | Payer: BLUE CROSS/BLUE SHIELD | Source: Ambulatory Visit | Attending: Osteopathic Medicine | Admitting: Osteopathic Medicine

## 2017-12-03 ENCOUNTER — Encounter: Payer: Self-pay | Admitting: Osteopathic Medicine

## 2017-12-03 VITALS — BP 142/84 | HR 73 | Wt 131.0 lb

## 2017-12-03 DIAGNOSIS — J449 Chronic obstructive pulmonary disease, unspecified: Secondary | ICD-10-CM | POA: Diagnosis not present

## 2017-12-03 DIAGNOSIS — Z8679 Personal history of other diseases of the circulatory system: Secondary | ICD-10-CM

## 2017-12-03 DIAGNOSIS — I6523 Occlusion and stenosis of bilateral carotid arteries: Secondary | ICD-10-CM | POA: Diagnosis not present

## 2017-12-03 DIAGNOSIS — R7303 Prediabetes: Secondary | ICD-10-CM | POA: Diagnosis not present

## 2017-12-03 DIAGNOSIS — I10 Essential (primary) hypertension: Secondary | ICD-10-CM

## 2017-12-03 DIAGNOSIS — Z853 Personal history of malignant neoplasm of breast: Secondary | ICD-10-CM

## 2017-12-03 LAB — CBC
HCT: 39.7 % (ref 35.0–45.0)
HEMOGLOBIN: 13.3 g/dL (ref 11.7–15.5)
MCH: 29.2 pg (ref 27.0–33.0)
MCHC: 33.5 g/dL (ref 32.0–36.0)
MCV: 87.1 fL (ref 80.0–100.0)
MPV: 10.7 fL (ref 7.5–12.5)
PLATELETS: 336 10*3/uL (ref 140–400)
RBC: 4.56 10*6/uL (ref 3.80–5.10)
RDW: 13.1 % (ref 11.0–15.0)
WBC: 10.2 10*3/uL (ref 3.8–10.8)

## 2017-12-03 LAB — LIPID PANEL
CHOLESTEROL: 183 mg/dL (ref <200)
HDL: 54 mg/dL (ref >50)
LDL Cholesterol (Calc): 111 mg/dL (calc) — ABNORMAL HIGH
Non-HDL Cholesterol (Calc): 129 mg/dL (calc) (ref <130)
Total CHOL/HDL Ratio: 3.4 (calc) (ref <5.0)
Triglycerides: 88 mg/dL (ref <150)

## 2017-12-03 LAB — COMPLETE METABOLIC PANEL WITH GFR
AG Ratio: 1.8 (calc) (ref 1.0–2.5)
ALT: 22 U/L (ref 6–29)
AST: 26 U/L (ref 10–35)
Albumin: 4.4 g/dL (ref 3.6–5.1)
Alkaline phosphatase (APISO): 69 U/L (ref 33–130)
BUN: 22 mg/dL (ref 7–25)
CO2: 28 mmol/L (ref 20–32)
CREATININE: 0.71 mg/dL (ref 0.50–1.05)
Calcium: 9.5 mg/dL (ref 8.6–10.4)
Chloride: 106 mmol/L (ref 98–110)
GFR, EST NON AFRICAN AMERICAN: 95 mL/min/{1.73_m2} (ref >=60)
GFR, Est African American: 110 mL/min/{1.73_m2} (ref >=60)
GLOBULIN: 2.5 g/dL (ref 1.9–3.7)
GLUCOSE: 91 mg/dL (ref 65–99)
Potassium: 4.5 mmol/L (ref 3.5–5.3)
Sodium: 142 mmol/L (ref 135–146)
Total Bilirubin: 0.4 mg/dL (ref 0.2–1.2)
Total Protein: 6.9 g/dL (ref 6.1–8.1)

## 2017-12-03 LAB — HEMOGLOBIN A1C
EAG (MMOL/L): 7.4 (calc)
Hgb A1c MFr Bld: 6.3 % of total Hgb — ABNORMAL HIGH (ref <5.7)
Mean Plasma Glucose: 134 (calc)

## 2017-12-03 LAB — URIC ACID: URIC ACID, SERUM: 3.3 mg/dL (ref 2.5–7.0)

## 2017-12-03 MED ORDER — ATORVASTATIN CALCIUM 40 MG PO TABS: 40.0000 mg | ORAL_TABLET | Freq: Every day | ORAL | 3 refills | Status: DC

## 2017-12-03 MED ORDER — COLCHICINE 0.6 MG PO TABS: 0.6000 mg | ORAL_TABLET | Freq: Every day | ORAL | 1 refills | Status: DC

## 2017-12-03 MED ORDER — AMLODIPINE BESYLATE 5 MG PO TABS: 10.0000 mg | ORAL_TABLET | Freq: Every day | ORAL | 3 refills | Status: DC

## 2017-12-03 MED ORDER — METFORMIN HCL 500 MG PO TABS: 500.0000 mg | ORAL_TABLET | Freq: Every day | ORAL | 0 refills | Status: DC

## 2017-12-03 NOTE — Patient Instructions (Addendum)
Prediabetes:  Metformin 500 mg daily  Blood pressure: Increase Amlodipine from 5 mg to 10 mg daily Recheck BP in 2 weeks with nurse  Breast cancer: Referral placed to oncologist  Carotids: Ultrasound order placed, you should get a call to schedule this test

## 2017-12-03 NOTE — Progress Notes (Signed)
HPI: Sherri Gibson is a 57 y.o. female who  has a past medical history of Breast cancer ([place]) (2016), COPD (chronic obstructive pulmonary disease) ([place]), High blood pressure, and High cholesterol.  she presents to [facility] today, 12/03/17,  for chief complaint of:  BP follow-up Request cardiology referral  BP: taking Losartan 50 mg, Amlodipine 5 mg. No CP/SOB, no HA/VC.   Concern about carotid stenosis, has been a few years since scanned.   Pre-DM: A1C pre-DM levels, hx A1C 6.5 at previous PCP  Gout: needs refills, doing well on [place] ppx      Past medical history, surgical history, and family history reviewed.  Current medication list and allergy/intolerance information reviewed.   (See remainder of HPI, ROS, Phys Exam below)  No results found.  Results for orders placed or performed in visit on 10/14/17 (from the past 72 hour(s))  Uric acid     Status: None   Collection Time: 12/02/17  7:32 AM  Result Value Ref Range   Uric Acid, Serum 3.3 2.5 - 7.0 mg/dL    Comment: Therapeutic target for gout patients: <6.0 mg/dL .   COMPLETE METABOLIC PANEL WITH GFR     Status: None   Collection Time: 12/02/17  7:32 AM  Result Value Ref Range   Glucose, Bld 91 65 - 99 mg/dL    Comment: .            Fasting reference interval .    BUN 22 7 - 25 mg/dL   Creat 0.71 0.50 - 1.05 mg/dL    Comment: For patients >63 years of age, the reference limit for Creatinine is approximately 13% higher for people identified as African-American. .    GFR, Est Non African American 95 > OR = 60 mL/min/1.56m2   GFR, Est African American 110 > OR = 60 mL/min/1.70m2   BUN/Creatinine Ratio NOT APPLICABLE 6 - 22 (calc)   Sodium 142 135 - 146 mmol/L   Potassium 4.5 3.5 - 5.3 mmol/L   Chloride 106 98 - 110 mmol/L   CO2 28 20 - 32 mmol/L   Calcium 9.5 8.6 - 10.4 mg/dL   Total Protein 6.9 6.1 - 8.1 g/dL   Albumin 4.4 3.6 - 5.1 g/dL   Globulin 2.5 1.9  - 3.7 g/dL (calc)   AG Ratio 1.8 1.0 - 2.5 (calc)   Total Bilirubin 0.4 0.2 - 1.2 mg/dL   Alkaline phosphatase (APISO) 69 33 - 130 U/L   AST 26 10 - 35 U/L   ALT 22 6 - 29 U/L     ASSESSMENT/PLAN:   Essential hypertension - Plan: amLODipine (NORVASC) 5 MG tablet  Prediabetes - Plan: metFORMIN (GLUCOPHAGE) 500 MG tablet  History of carotid stenosis - Plan: US Carotid Duplex Bilateral  COPD, severe (HCC)  History of breast cancer - Plan: Ambulatory referral to Hematology / Oncology   Meds ordered this encounter  Medications  . metFORMIN (GLUCOPHAGE) 500 MG tablet    Sig: Take 1 tablet (500 mg total) by mouth daily with breakfast.    Dispense:  30 tablet    Refill:  0  . amLODipine (NORVASC) 5 MG tablet    Sig: Take 2 tablets (10 mg total) by mouth daily.    Dispense:  90 tablet    Refill:  3    Patient Instructions  Prediabetes:  Metformin 500 mg daily  Blood pressure: Increase Amlodipine from 5 mg to 10 mg daily Recheck BP in 2  weeks with nurse  Breast cancer: Referral placed to oncologist  Carotids: Ultrasound order placed, you should get a call to schedule this test    Follow-up plan: Return in about 2 weeks (around 12/17/2017) for nurse visit recheck blood pressure.                               ############################################ ############################################ ############################################ ############################################    Outpatient Encounter Medications as of 12/03/2017  Medication Sig  . albuterol (PROAIR HFA) 108 (90 Base) MCG/ACT inhaler inhale 2 puff by inhalation route  every 4 - 6 hours as needed  . albuterol (PROVENTIL) (2.5 MG/3ML) 0.083% nebulizer solution Take 3 mLs (2.5 mg total) by nebulization every 6 (six) hours as needed for Wheezing.  Marland Kitchen amLODipine (NORVASC) 5 MG tablet Take 1 tablet (5 mg total) by mouth daily.  Marland Kitchen amoxicillin-clavulanate (AUGMENTIN)  875-125 MG tablet Take 1 tablet by mouth 2 (two) times daily.  Marland Kitchen anastrozole (ARIMIDEX) 1 MG tablet TAKE 1 TABLET ONCE A DAY  . aspirin 81 MG chewable tablet Chew by mouth.  . budesonide-formoterol (SYMBICORT) 160-4.5 MCG/ACT inhaler Inhale into the lungs.  . colchicine 0.6 MG tablet Take 1 tablet (0.6 mg total) by mouth daily.  . cyclobenzaprine (FLEXERIL) 10 MG tablet Take by mouth.  . losartan (COZAAR) 50 MG tablet Take 1 tablet (50 mg total) by mouth daily.  . meloxicam (MOBIC) 15 MG tablet Take 1 tablet (15 mg total) by mouth daily.  . montelukast (SINGULAIR) 10 MG tablet Take 1 tablet (10 mg total) by mouth at bedtime.  . pravastatin (PRAVACHOL) 20 MG tablet Take 1 tablet (20 mg total) by mouth daily.  . predniSONE (DELTASONE) 10 MG tablet Take 3 tabs for 2 days, then 2 tabs for 2 days, then 1 tab for 2 days, then stop  . Tiotropium Bromide Monohydrate 2.5 MCG/ACT AERS Inhale 1 puff into the lungs 2 (two) times daily.  . traZODone (DESYREL) 50 MG tablet TAKE 1 TABLET BY MOUTH EVERYDAY AT BEDTIME   No facility-administered encounter medications on file as of 12/03/2017.    Allergies  Allergen Reactions  . Codeine Itching    Feels like something crawling on her skin   . Tetanus Toxoids Other (See Comments)      Review of Systems:  Constitutional: No recent illness  HEENT: No  headache, no vision change  Cardiac: No  chest pain, No  pressure, No palpitations  Respiratory:  No  shortness of breath. No  Cough  Gastrointestinal: No  abdominal pain, no change on bowel habits  Musculoskeletal: No new myalgia/arthralgia  Skin: No  Rash  Hem/Onc: No  easy bruising/bleeding, No  abnormal lumps/bumps  Neurologic: No  weakness, No  Dizziness  Psychiatric: No  concerns with depression, No  concerns with anxiety  Exam:  BP (!) 142/84 (BP Location: Left Arm, Patient Position: Sitting, Cuff Size: Normal)   Pulse 73   Wt 131 lb (59.4 kg)   BMI 26.46 kg/m   Constitutional: VS  see above. General Appearance: alert, well-developed, well-nourished, NAD  Eyes: Normal lids and conjunctive, non-icteric sclera  Ears, Nose, Mouth, Throat: MMM, Normal external inspection ears/nares/mouth/lips/gums.  Neck: No masses, trachea midline.   Respiratory: Normal respiratory effort. no wheeze, no rhonchi, no rales  Cardiovascular: S1/S2 normal, no murmur, no rub/gallop auscultated. RRR. I could not appreciate any carotid bruit  Musculoskeletal: Gait normal. Symmetric and independent movement of all extremities  Neurological: Normal  balance/coordination. No tremor.  Skin: warm, dry, intact.   Psychiatric: Normal judgment/insight. Normal mood and affect. Oriented x3.   Visit summary with medication list and pertinent instructions was printed for patient to review, advised to alert [name] if any changes needed. All questions at time of visit were answered - patient instructed to contact office with any additional concerns. ER/RTC precautions were reviewed with the patient and understanding verbalized.   Follow-up plan: Return in about 2 weeks (around 12/17/2017) for nurse visit recheck blood pressure.     Please note: voice recognition software was used to produce this document, and typos may escape review. Please contact Dr. Sheppard Coil for any needed clarifications.

## 2017-12-03 NOTE — Addendum Note (Signed)
Addended by: Maryla Morrow on: 12/03/2017 03:27 PM   Modules accepted: Orders

## 2017-12-06 ENCOUNTER — Telehealth: Payer: Self-pay | Admitting: Hematology

## 2017-12-06 NOTE — Telephone Encounter (Signed)
Spoke with pt to confirm new patient appt on 11/22 at 0830 am

## 2017-12-16 ENCOUNTER — Ambulatory Visit (INDEPENDENT_AMBULATORY_CARE_PROVIDER_SITE_OTHER): Payer: BLUE CROSS/BLUE SHIELD | Admitting: Osteopathic Medicine

## 2017-12-16 VITALS — BP 111/71 | HR 70 | Temp 98.3°F | Wt 132.0 lb

## 2017-12-16 DIAGNOSIS — I10 Essential (primary) hypertension: Secondary | ICD-10-CM | POA: Diagnosis not present

## 2017-12-16 DIAGNOSIS — R7303 Prediabetes: Secondary | ICD-10-CM

## 2017-12-16 MED ORDER — CYCLOBENZAPRINE HCL 10 MG PO TABS: 10.0000 mg | ORAL_TABLET | Freq: Three times a day (TID) | ORAL | 1 refills | Status: DC | PRN

## 2017-12-16 MED ORDER — AMLODIPINE BESYLATE 10 MG PO TABS: 10.0000 mg | ORAL_TABLET | Freq: Every day | ORAL | 1 refills | Status: DC

## 2017-12-16 MED ORDER — METFORMIN HCL 500 MG PO TABS: 500.0000 mg | ORAL_TABLET | Freq: Every day | ORAL | 3 refills | Status: DC

## 2017-12-16 NOTE — Progress Notes (Signed)
Left message for patient that her RX had been sent to her pharmacies. Left message for patient to return her 4 weeks to recheck her A1c.

## 2017-12-16 NOTE — Progress Notes (Signed)
Patient in today for BP check. BP at last visit was 142/84. Patient was instructed to increase Norvasc to 10 mg, Pt states she has been taking the 10 mg without any side effects. BP in the office today is 111/71and pulse 70. Pt denies headaches, blurred vision, chest pain and shortness of breath. Pt is to continue with current medications, Provider will be notified . If any changes Pt will be called and advised. Pt also has request for her medications as follows: Pt needs refill of flexeril sent to her local [facility] requesting just 30 day supply.  Pt also request norvasc 10 mg tablets and [facility]

## 2017-12-16 NOTE — Progress Notes (Signed)
Refills sent Pt will need to f/u in 4 mos to monitor A1C in office

## 2017-12-19 ENCOUNTER — Other Ambulatory Visit: Payer: Self-pay | Admitting: Hematology

## 2017-12-19 DIAGNOSIS — C50912 Malignant neoplasm of unspecified site of left female breast: Secondary | ICD-10-CM

## 2017-12-19 DIAGNOSIS — Z170 Estrogen receptor positive status [ER+]: Principal | ICD-10-CM

## 2017-12-19 NOTE — Progress Notes (Signed)
[facility] CONSULT NOTE  Patient Care Team: Emeterio Reeve, DO as PCP - General (Osteopathic Medicine)  HEME/ONC OVERVIEW: 1. 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, ER/PR+, HER2- -I reviewed the patient's external records in detail, including oncology clinic notes and [facility] -In summary, patient was diagnosed with stage I IDC of left breast, ER/PR positive, HER-2 negative; she underwent left lumpectomy with SLN biopsy, which confirmed Stage I disease; she completed adjuvant radiation -She is currently tolerating Rheumatrex well without significant side effects, such as arthralgia or myalgia -I counseled the patient on the importance of surveillance mammogram, next due in 03/2018  Osteopenia -Patient reports last DEXA scan in 2016, which showed osteopenia -I have ordered repeat DEXA scan to monitor for any interval change, given aromatase inhibitor can cause bone loss -I counseled the patient on the importance of taking calcium-vitamin D supplement  Orders Placed This Encounter  Procedures  . [facility] MAMMOGRAPHY    Standing Status:   Future    Standing Expiration Date:   06/20/2019  . DG Bone Density    Standing Status:   Future    Standing Expiration Date:   01/24/2019    Order Specific Question:   Reason for Exam (SYMPTOM  OR DIAGNOSIS REQUIRED)    Answer:   Osteopenia on aromatase inhibitor for breast cancer    Order Specific Question:   Preferred imaging location?    [facility]:   [facility]  Order Specific Question:   Is the patient pregnant?    Answer:   No  . CBC with Differential  (Cancer Center Only)    Standing Status:   Future    Standing Expiration Date:   01/24/2019  . CMP ([facility])    Standing Status:   Future    Standing Expiration Date:   01/24/2019    A total of more than 45 minutes were spent face-to-face with the patient during this encounter and over half of that time was spent on counseling and coordination of care as outlined above.    All questions were answered. The patient knows to call the clinic with any problems, questions or concerns.  Return in 1 year for labs and follow-up.   Sherri MenMD 12/20/2017 9:14 AM   CHIEF COMPLAINTS/PURPOSE OF CONSULTATION:  "I am here for breast cancer follow-up"  HISTORY OF PRESENTING ILLNESS:  [place]89[place] is here because of history of stage I IDC of left breast.  Patient was diagnosed with Stage I IDC of left breast in 2016, status post lumpectomy and adjuvant RT.  She was started on anastrozole after completing RT in 2016, and has been tolerating it well without any significant side effects.  [facility], when [facility] She reports feeling well today and denies any constitutional symptoms.  She performs self breast exam home, and denies finding any abnormalities.  MEDICAL HISTORY:  Past Medical History:  Diagnosis Date  . [place]   [place]   .  COPD (chronic obstructive pulmonary disease) ([place])   . High blood pressure   . High cholesterol     SURGICAL HISTORY: Past Surgical History:  Procedure Laterality Date  . BREAST LUMPECTOMY WITH AXILLARY LYMPH NODE BIOPSY  2016  . PARTIAL HYSTERECTOMY      SOCIAL HISTORY: Social History   Socioeconomic History  . Marital status: Married    Spouse name: Not on file  . Number of children: Not on file  . Years of education: Not on file  . Highest education level: Not on file  Occupational History  . Not on file  Social  Needs  . Financial resource strain: Not on file  . Food insecurity:    Worry: Not on file    Inability: Not on file  . Transportation needs:    Medical: Not on file    Non-medical: Not on file  Tobacco Use  . Smoking status: Former Smoker    Packs/day: 2.00    Years: 40.00    Pack years: 80.00    Last attempt to quit: 11/29/2016    Years since quitting: 1.0  . Smokeless tobacco: Never Used  Substance and Sexual Activity  . Alcohol use: Not Currently  . Drug use: Never  . Sexual activity: Yes    Partners: Male    Birth control/protection: None  Lifestyle  . Physical activity:    Days per week: Not on file    Minutes per session: Not on file  . Stress: Not on file  Relationships  . Social connections:    Talks on phone: Not on file    Gets together: Not on file    Attends religious service: Not on file    Active member of club or organization: Not on file    Attends meetings of clubs or organizations: Not on file    Relationship status: Not on file  . Intimate partner violence:    Fear of current or ex partner: Not on file    Emotionally abused: Not on file    Physically abused: Not on file    Forced sexual activity: Not on file  Other Topics Concern  . Not on file  Social History Narrative  . Not on file    FAMILY HISTORY: Family History  Problem Relation Age of Onset  . Breast cancer Mother   . Diabetes Brother     ALLERGIES:  is allergic to codeine and tetanus toxoids.  MEDICATIONS:  Current Outpatient Medications  Medication Sig Dispense Refill  . albuterol (PROAIR HFA) 108 (90 Base) MCG/ACT inhaler inhale 2 puff by inhalation route  every 4 - 6 hours as needed    . albuterol (PROVENTIL) (2.5 MG/3ML) 0.083% nebulizer solution Take 3 mLs (2.5 mg total) by nebulization every 6 (six) hours as needed for Wheezing.    Marland Kitchen amLODipine (NORVASC) 10 MG tablet Take 1 tablet (10 mg total) by mouth daily. 90 tablet 1  . anastrozole (ARIMIDEX) 1 MG tablet TAKE 1 TABLET  ONCE A DAY    . aspirin 81 MG chewable tablet Chew by mouth.    Marland Kitchen atorvastatin (LIPITOR) 40 MG tablet Take 1 tablet (40 mg total) by mouth daily. 90 tablet 3  . budesonide-formoterol (SYMBICORT) 160-4.5 MCG/ACT inhaler Inhale into the lungs.    . colchicine 0.6 MG tablet Take 1 tablet (0.6 mg total) by mouth daily. 90 tablet 1  . cyclobenzaprine (FLEXERIL) 10 MG tablet Take 1 tablet (10 mg total) by mouth 3 (three) times daily as  needed for muscle spasms. 30 tablet 1  . losartan (COZAAR) 50 MG tablet Take 1 tablet (50 mg total) by mouth daily. 90 tablet 0  . meloxicam (MOBIC) 15 MG tablet Take 1 tablet (15 mg total) by mouth daily. 90 tablet 3  . metFORMIN (GLUCOPHAGE) 500 MG tablet Take 1 tablet (500 mg total) by mouth daily with breakfast. 90 tablet 3  . montelukast (SINGULAIR) 10 MG tablet Take 1 tablet (10 mg total) by mouth at bedtime. 90 tablet 3  . Tiotropium Bromide Monohydrate 2.5 MCG/ACT AERS Inhale 1 puff into the lungs 2 (two) times daily. 3 Inhaler 3  . traZODone (DESYREL) 50 MG tablet TAKE 1 TABLET BY MOUTH EVERYDAY AT BEDTIME 90 tablet 0   No current facility-administered medications for this visit.     REVIEW OF SYSTEMS:   Constitutional: ( - ) fevers, ( - )  chills , ( - ) night sweats Eyes: ( - ) blurriness of vision, ( - ) double vision, ( - ) watery eyes Ears, nose, mouth, throat, and face: ( - ) mucositis, ( - ) sore throat Respiratory: ( - ) cough, ( - ) dyspnea, ( - ) wheezes Cardiovascular: ( - ) palpitation, ( - ) chest discomfort, ( - ) lower extremity swelling Gastrointestinal:  ( - ) nausea, ( - ) heartburn, ( - ) change in bowel habits Skin: ( - ) abnormal skin rashes Lymphatics: ( - ) new lymphadenopathy, ( - ) easy bruising Neurological: ( - ) numbness, ( - ) tingling, ( - ) new weaknesses Behavioral/Psych: ( - ) mood change, ( - ) new changes  All other systems were reviewed with the patient and are negative.  PHYSICAL EXAMINATION: ECOG PERFORMANCE  STATUS: 0 - Asymptomatic  Vitals:   12/20/17 0837  BP: 119/69  Pulse: 71  Resp: 18  Temp: 97.8 F (36.6 C)  SpO2: 98%   Filed Weights   12/20/17 0837  Weight: 132 lb (59.9 kg)    GENERAL: alert, no distress and comfortable SKIN: skin color, texture, turgor are normal, no rashes or significant lesions EYES: conjunctiva are pink and non-injected, sclera clear OROPHARYNX: no exudate, no erythema; lips, buccal mucosa, and tongue normal  NECK: supple, non-tender LYMPH:  no palpable lymphadenopathy in the cervical or axillary LUNGS: clear to auscultation and percussion with normal breathing effort HEART: regular rate & rhythm, no murmurs, no lower extremity edema ABDOMEN: soft, non-tender, non-distended, normal bowel sounds Musculoskeletal: no cyanosis of digits and no clubbing  PSYCH: alert & oriented x 3, fluent speech NEURO: no focal motor/sensory deficits BREASTS: left breast lumpectomy scar well healed, no abnormal masses palpated in either breast, no axillary adenopathy   LABORATORY DATA:  I have reviewed the data as listed Lab Results  Component Value Date   WBC 5.7 12/20/2017   HGB 13.1 12/20/2017   HCT 41.8 12/20/2017   MCV 92.1 12/20/2017   PLT 284 12/20/2017   Lab Results  Component Value Date   NA 142 12/02/2017   K 4.5 12/02/2017   CL 106 12/02/2017   CO2 28 12/02/2017    RADIOGRAPHIC STUDIES: I have personally reviewed the radiological images as listed and agreed with the findings in the report. US Carotid Duplex Bilateral  Result Date: 12/03/2017 CLINICAL DATA:  Carotid stenosis EXAM: BILATERAL CAROTID DUPLEX ULTRASOUND TECHNIQUE: Pearline Cables scale imaging, color Doppler and duplex ultrasound were performed of bilateral carotid and vertebral arteries in the neck. COMPARISON:  None. FINDINGS: Criteria: Quantification of carotid  stenosis is based on velocity parameters that correlate the residual internal carotid diameter with NASCET-based stenosis levels, using  the diameter of the distal internal carotid lumen as the denominator for stenosis measurement. The following velocity measurements were obtained: RIGHT ICA: 150 cm/sec CCA: 80 cm/sec SYSTOLIC ICA/CCA RATIO:  2.2 ECA: 71 cm/sec LEFT ICA: 149 cm/sec CCA: 99 cm/sec SYSTOLIC ICA/CCA RATIO:  1.6 ECA: 75 cm/sec RIGHT CAROTID ARTERY: Minimal calcified plaque in the bulb. Low resistance internal carotid Doppler pattern is preserved. The internal carotid is markedly tortuous resulting in elevated velocities. RIGHT VERTEBRAL ARTERY:  Antegrade LEFT CAROTID ARTERY: Mild calcified plaque in the bulb. Low resistance internal carotid Doppler pattern is preserved. The internal carotid is markedly tortuous. LEFT VERTEBRAL ARTERY:  Antegrade. Upper extremity blood pressures: RIGHT: 120/79 LEFT: Not performed due to a left axillary lymph node dissection. IMPRESSION: Less than 50% stenosis in the right and left internal carotid arteries. Elevated velocities are a result of marked internal carotid artery tortuosity. Electronically Signed   By: Marybelle Killings M.D.   On: 12/03/2017 13:29    PATHOLOGY: I have reviewed the pathology reports as documented in the oncologist history.

## 2017-12-20 ENCOUNTER — Inpatient Hospital Stay: Payer: BLUE CROSS/BLUE SHIELD | Attending: Hematology | Admitting: Hematology

## 2017-12-20 ENCOUNTER — Inpatient Hospital Stay: Payer: BLUE CROSS/BLUE SHIELD

## 2017-12-20 ENCOUNTER — Encounter: Payer: Self-pay | Admitting: Hematology

## 2017-12-20 VITALS — BP 119/69 | HR 71 | Temp 97.8°F | Resp 18 | Ht 59.0 in | Wt 132.0 lb

## 2017-12-20 DIAGNOSIS — Z87891 Personal history of nicotine dependence: Secondary | ICD-10-CM | POA: Diagnosis not present

## 2017-12-20 DIAGNOSIS — Z79811 Long term (current) use of aromatase inhibitors: Secondary | ICD-10-CM

## 2017-12-20 DIAGNOSIS — E7800 Pure hypercholesterolemia, unspecified: Secondary | ICD-10-CM

## 2017-12-20 DIAGNOSIS — C50912 Malignant neoplasm of unspecified site of left female breast: Secondary | ICD-10-CM

## 2017-12-20 DIAGNOSIS — J449 Chronic obstructive pulmonary disease, unspecified: Secondary | ICD-10-CM | POA: Diagnosis not present

## 2017-12-20 DIAGNOSIS — Z79899 Other long term (current) drug therapy: Secondary | ICD-10-CM | POA: Diagnosis not present

## 2017-12-20 DIAGNOSIS — I10 Essential (primary) hypertension: Secondary | ICD-10-CM

## 2017-12-20 DIAGNOSIS — Z170 Estrogen receptor positive status [ER+]: Principal | ICD-10-CM

## 2017-12-20 DIAGNOSIS — M8580 Other specified disorders of bone density and structure, unspecified site: Secondary | ICD-10-CM

## 2017-12-20 DIAGNOSIS — Z791 Long term (current) use of non-steroidal anti-inflammatories (NSAID): Secondary | ICD-10-CM | POA: Diagnosis not present

## 2017-12-20 DIAGNOSIS — Z7984 Long term (current) use of oral hypoglycemic drugs: Secondary | ICD-10-CM

## 2017-12-20 DIAGNOSIS — Z923 Personal history of irradiation: Secondary | ICD-10-CM

## 2017-12-20 LAB — CMP (CANCER CENTER ONLY)
ALK PHOS: 78 U/L (ref 38–126)
ALT: 36 U/L (ref 0–44)
AST: 31 U/L (ref 15–41)
Albumin: 4.1 g/dL (ref 3.5–5.0)
Anion gap: 10 (ref 5–15)
BUN: 18 mg/dL (ref 6–20)
CALCIUM: 9.7 mg/dL (ref 8.9–10.3)
CO2: 28 mmol/L (ref 22–32)
Chloride: 106 mmol/L (ref 98–111)
Creatinine: 0.96 mg/dL (ref 0.44–1.00)
GFR, Est AFR Am: >60 mL/min (ref >60)
Glucose, Bld: 79 mg/dL (ref 70–99)
Potassium: 4 mmol/L (ref 3.5–5.1)
Sodium: 144 mmol/L (ref 135–145)
TOTAL PROTEIN: 7.3 g/dL (ref 6.5–8.1)
Total Bilirubin: 0.5 mg/dL (ref 0.3–1.2)

## 2017-12-20 LAB — CBC WITH DIFFERENTIAL (CANCER CENTER ONLY)
Abs Immature Granulocytes: 0.02 10*3/uL (ref 0.00–0.07)
Basophils Absolute: 0 10*3/uL (ref 0.0–0.1)
Basophils Relative: 1 %
Eosinophils Absolute: 0.4 10*3/uL (ref 0.0–0.5)
Eosinophils Relative: 7 %
HCT: 41.8 % (ref 36.0–46.0)
Hemoglobin: 13.1 g/dL (ref 12.0–15.0)
Immature Granulocytes: 0 %
Lymphocytes Relative: 25 %
Lymphs Abs: 1.4 10*3/uL (ref 0.7–4.0)
MCH: 28.9 pg (ref 26.0–34.0)
MCHC: 31.3 g/dL (ref 30.0–36.0)
MCV: 92.1 fL (ref 80.0–100.0)
MONO ABS: 0.8 10*3/uL (ref 0.1–1.0)
Monocytes Relative: 14 %
NRBC: 0 % (ref 0.0–0.2)
Neutro Abs: 3 10*3/uL (ref 1.7–7.7)
Neutrophils Relative %: 53 %
Platelet Count: 284 10*3/uL (ref 150–400)
RBC: 4.54 MIL/uL (ref 3.87–5.11)
RDW: 12.8 % (ref 11.5–15.5)
WBC Count: 5.7 10*3/uL (ref 4.0–10.5)

## 2017-12-26 DIAGNOSIS — J449 Chronic obstructive pulmonary disease, unspecified: Secondary | ICD-10-CM | POA: Diagnosis not present

## 2018-01-02 ENCOUNTER — Telehealth: Payer: Self-pay | Admitting: Pulmonary Disease

## 2018-01-02 NOTE — Telephone Encounter (Signed)
atc pt X2, line rang to fast busy signal.  No other number for pt.  Wcb.

## 2018-01-03 NOTE — Telephone Encounter (Signed)
Called and spoke with pt. When I spoke with pt, she stated she was actually calling for her husband but husband has never been a pt at our office. Per Loletha Carrow, husband is just going to go to pcp to be checked out.nothing further needed.

## 2018-01-09 ENCOUNTER — Telehealth: Payer: Self-pay | Admitting: Pulmonary Disease

## 2018-01-09 MED ORDER — BUDESONIDE-FORMOTEROL FUMARATE 160-4.5 MCG/ACT IN AERO: 2.0000 | INHALATION_SPRAY | Freq: Two times a day (BID) | RESPIRATORY_TRACT | 3 refills | Status: DC

## 2018-01-09 NOTE — Telephone Encounter (Signed)
Did a PA via [email or web address] for pt's symbicort.   Medication name and strength: symbicort 160 Provider: [facility] Patient insurance ID: [id number] Phone: [phone] Fax: [phone]  Was the PA started on CMM?  yes If yes, please enter the Key: [id number] Timeframe for approval/denial: received documentation stating that the medication is covered by current benefit plan  Called pt's pharmacy and spoke with Oley Balm to see if medication did indeed need a prior authorization. Per Oley Balm, if the Rx is written for 1 inhaler every 31-33 days, a PA is not required and if the Rx is written for a 90 day supply, a 90 day supply is not required. I did send the Rx as a 90 day supply for pt.  Called and spoke with pt letting her know this information. I also stated to her before I called [facility] that I did the PA and got the same information that the med is covered the way we were doing the Rx. Pt expressed understanding. Nothing further needed.

## 2018-01-25 DIAGNOSIS — J449 Chronic obstructive pulmonary disease, unspecified: Secondary | ICD-10-CM | POA: Diagnosis not present

## 2018-01-27 ENCOUNTER — Other Ambulatory Visit: Payer: Self-pay | Admitting: Osteopathic Medicine

## 2018-01-29 DIAGNOSIS — J449 Chronic obstructive pulmonary disease, unspecified: Secondary | ICD-10-CM | POA: Diagnosis not present

## 2018-02-21 ENCOUNTER — Other Ambulatory Visit: Payer: Self-pay | Admitting: Hematology

## 2018-02-21 DIAGNOSIS — Z0000 Encounter for general adult medical examination without abnormal findings: Secondary | ICD-10-CM

## 2018-02-25 DIAGNOSIS — J449 Chronic obstructive pulmonary disease, unspecified: Secondary | ICD-10-CM | POA: Diagnosis not present

## 2018-03-17 ENCOUNTER — Ambulatory Visit: Payer: BLUE CROSS/BLUE SHIELD | Admitting: Osteopathic Medicine

## 2018-03-17 VITALS — BP 133/75 | HR 93 | Temp 98.7°F | Wt 134.5 lb

## 2018-03-17 DIAGNOSIS — R21 Rash and other nonspecific skin eruption: Secondary | ICD-10-CM | POA: Diagnosis not present

## 2018-03-17 MED ORDER — BETAMETHASONE DIPROPIONATE 0.05 % EX CREA: TOPICAL_CREAM | Freq: Two times a day (BID) | CUTANEOUS | 1 refills | Status: DC

## 2018-03-17 NOTE — Progress Notes (Signed)
HPI: Sherri Gibson is a 58 y.o. female who  has a past medical history of Breast cancer ([place]) (2016), COPD (chronic obstructive pulmonary disease) ([place]), High blood pressure, and High cholesterol.  she presents to [facility] today, 03/17/18,  for chief complaint of:  Skin problem   Started itching last week after doing some yard work, sports on L upper back, spot under L breast. See photos. Not really blistering, no burning, no band-like dermatomal pain. No OTC meds or other home remedies tried.      At today's visit 03/17/18 ... PMH, PSH, FH reviewed and updated as needed.  Current medication list and allergy/intolerance hx reviewed and updated as needed. (See remainder of HPI, ROS, Phys Exam below)  Under breast on L    Back on L            ASSESSMENT/PLAN: The encounter diagnosis was Rash and nonspecific skin eruption.     Meds ordered this encounter  Medications  . betamethasone dipropionate (DIPROLENE) 0.05 % cream    Sig: Apply topically 2 (two) times daily. To affected area(s) as needed    Dispense:  15 g    Refill:  1    Patient Instructions  Call if worse! Come see me if not improving by next week     Consider biopsy if no better Outside chance of shingles? Would initiate antivirals if ANY worse.    Follow-up plan: Return if symptoms worsen or fail to improve.                                                 ################################################# ################################################# ################################################# #################################################    Current Meds  Medication Sig  . albuterol (PROAIR HFA) 108 (90 Base) MCG/ACT inhaler inhale 2 puff by inhalation route  every 4 - 6 hours as needed  . albuterol (PROVENTIL) (2.5 MG/3ML) 0.083% nebulizer solution Take 3 mLs (2.5 mg total) by nebulization  every 6 (six) hours as needed for Wheezing.  Marland Kitchen amLODipine (NORVASC) 10 MG tablet Take 1 tablet (10 mg total) by mouth daily.  Marland Kitchen anastrozole (ARIMIDEX) 1 MG tablet TAKE 1 TABLET ONCE A DAY  . aspirin 81 MG chewable tablet Chew by mouth.  Marland Kitchen atorvastatin (LIPITOR) 40 MG tablet Take 1 tablet (40 mg total) by mouth daily.  . budesonide-formoterol (SYMBICORT) 160-4.5 MCG/ACT inhaler Inhale 2 puffs into the lungs 2 (two) times daily.  . colchicine 0.6 MG tablet Take 1 tablet (0.6 mg total) by mouth daily.  . cyclobenzaprine (FLEXERIL) 10 MG tablet Take 1 tablet (10 mg total) by mouth 3 (three) times daily as needed for muscle spasms.  Marland Kitchen losartan (COZAAR) 50 MG tablet TAKE 1 TABLET DAILY  . meloxicam (MOBIC) 15 MG tablet Take 1 tablet (15 mg total) by mouth daily.  . metFORMIN (GLUCOPHAGE) 500 MG tablet Take 1 tablet (500 mg total) by mouth daily with breakfast.  . montelukast (SINGULAIR) 10 MG tablet Take 1 tablet (10 mg total) by mouth at bedtime.  . Tiotropium Bromide Monohydrate 2.5 MCG/ACT AERS Inhale 1 puff into the lungs 2 (two) times daily.  . traZODone (DESYREL) 50 MG tablet TAKE 1 TABLET BY MOUTH EVERYDAY AT BEDTIME    Allergies  Allergen Reactions  . Codeine Itching    Feels like something crawling on her skin   . Tetanus Toxoids  Other (See Comments)    Patient stated,"I was given a Pneumonia shot at the same time I got the Tetanus shot, in the same arm."       Review of Systems:  Constitutional: No recent illness  HEENT: No  headache  Musculoskeletal: No new myalgia/arthralgia  Skin: +Rash  Hem/Onc: No  easy bruising/bleeding  Neurologic: No  weakness, No  Dizziness   Exam:  BP 133/75 (BP Location: Left Arm, Patient Position: Sitting, Cuff Size: Normal)   Pulse 93   Temp 98.7 F (37.1 C) (Oral)   Wt 134 lb 8 oz (61 kg)   BMI 27.17 kg/m   Constitutional: VS see above. General Appearance: alert, well-developed, well-nourished, NAD  Respiratory: Normal  respiratory effort.  Neurological: Normal balance/coordination. No tremor.  Skin: warm, dry, intact. See photos. Back appears excoriated, front lesion ?tinea?   Psychiatric: Normal judgment/insight. Normal mood and affect. Oriented x3.       Visit summary with medication list and pertinent instructions was printed for patient to review, patient was advised to alert [name] if any updates are needed. All questions at time of visit were answered - patient instructed to contact office with any additional concerns. ER/RTC precautions were reviewed with the patient and understanding verbalized.     Please note: voice recognition software was used to produce this document, and typos may escape review. Please contact Dr. Sheppard Coil for any needed clarifications.    Follow up plan: Return if symptoms worsen or fail to improve.

## 2018-03-17 NOTE — Patient Instructions (Signed)
Call if worse! Come see me if not improving by next week

## 2018-03-20 ENCOUNTER — Other Ambulatory Visit: Payer: Self-pay | Admitting: Osteopathic Medicine

## 2018-03-26 ENCOUNTER — Ambulatory Visit (INDEPENDENT_AMBULATORY_CARE_PROVIDER_SITE_OTHER): Payer: BLUE CROSS/BLUE SHIELD

## 2018-03-26 DIAGNOSIS — M8589 Other specified disorders of bone density and structure, multiple sites: Secondary | ICD-10-CM

## 2018-03-26 DIAGNOSIS — Z1231 Encounter for screening mammogram for malignant neoplasm of breast: Secondary | ICD-10-CM

## 2018-03-26 DIAGNOSIS — C50912 Malignant neoplasm of unspecified site of left female breast: Secondary | ICD-10-CM

## 2018-03-26 DIAGNOSIS — Z170 Estrogen receptor positive status [ER+]: Principal | ICD-10-CM

## 2018-03-26 DIAGNOSIS — M8580 Other specified disorders of bone density and structure, unspecified site: Secondary | ICD-10-CM

## 2018-03-26 DIAGNOSIS — Z0000 Encounter for general adult medical examination without abnormal findings: Secondary | ICD-10-CM

## 2018-03-26 DIAGNOSIS — Z780 Asymptomatic menopausal state: Secondary | ICD-10-CM | POA: Diagnosis not present

## 2018-03-27 ENCOUNTER — Telehealth: Payer: Self-pay | Admitting: *Deleted

## 2018-03-27 NOTE — Telephone Encounter (Signed)
Patient notified per order of Dr. Maylon Peppers that her bone scan showed osteopenia, that she should be on daily Vit-D-Ca supplement and that her mammogram was normal.  Patient states that she is currently taking Vit-D-Ca twice a day and is aware of next scheduled appt in November.   Patient appreciative of call and has no questions at this time.

## 2018-03-27 NOTE — Telephone Encounter (Signed)
-----   Message from Tish Men, MD sent at 03/27/2018  9:18 AM EST ----- Regarding: Results Can we let Ms. Denherder know her bone scan showed osteopenia, and she should be on daily Vit D-Ca supplement? Also, her mammogram was normal.   Thanks.  [place]  ----- Message ----- From: Interface, Rad Results In Sent: 03/27/2018   8:56 AM EST To: Tish Men, MD

## 2018-03-28 DIAGNOSIS — J449 Chronic obstructive pulmonary disease, unspecified: Secondary | ICD-10-CM | POA: Diagnosis not present

## 2018-04-09 ENCOUNTER — Telehealth: Payer: Self-pay

## 2018-04-09 NOTE — Telephone Encounter (Signed)
Robin from [employer] called - pt's insurance will not cover visit. As per Rep, provider is registered in [place] not [place]. Since service for the pt was provided in [place], provider's registration needs to be under [place]. Requesting provider to call [facility] at [phone],  ref# [id number].

## 2018-04-09 NOTE — Telephone Encounter (Signed)
They might have my info wrong from residency in [place] - not sure how this could have happened. Please call them and see what the issue is?

## 2018-04-10 NOTE — Telephone Encounter (Signed)
Called and spoke to [place] with [facility] at number provided and Sharee Pimple states that provider is ONLY registered in [place] and has "never been registered in [place]". I tried to provide our [facility] account number to Sharee Pimple to verify since Dr Sheppard Coil orders [facility] tests daily and has never had this issue, and Sharee Pimple declined to take the account number because she cannot speak for other situations. Sharee Pimple stated that I could call back if I had another NPI to provide her with.[place] asked Sharee Pimple to hold so that I could speak with Dr Sheppard Coil and she disconnected the call.

## 2018-04-10 NOTE — Telephone Encounter (Signed)
After the [facility] billing department was not helpful, I reached out to our account manager Nonnie Done. She is going to look into this and call us back. She was provided with Patient information as well as Provider information.   [name] is also going to pull the phone record from [place] with [facility] billing to look into that situation further.

## 2018-04-15 ENCOUNTER — Telehealth: Payer: Self-pay | Admitting: Pulmonary Disease

## 2018-04-15 NOTE — Telephone Encounter (Signed)
To Whom it May Concern:  Sherri Gibson is my patient and has a chronic respiratory illness.  She should not leave the house unless absolutely necessary or go to crowded environments given the current pandemic of coronavirus.  Please excuse her from [employer].  Sincerely,  Roselie Awkward

## 2018-04-15 NOTE — Telephone Encounter (Signed)
Returned phone call to patient, requesting a letter for jury duty. Informed patient that we will need her jury number to put. Please include juror and file number on letter.   Juror Number 735.File Number [phone].  Her court date is April 7th 2020.   Patient aware BQ will return Thursday.   BQ please advise.

## 2018-04-16 ENCOUNTER — Encounter: Payer: Self-pay | Admitting: General Surgery

## 2018-04-16 NOTE — Telephone Encounter (Signed)
LVM for patient to let her know the letter for jury duty has been approved and will be mailed.  Requested she call back if there are any further questions.  Letter prepared for signature. Will be mailed to patient today.

## 2018-04-18 ENCOUNTER — Other Ambulatory Visit: Payer: Self-pay

## 2018-04-18 MED ORDER — ALBUTEROL SULFATE HFA 108 (90 BASE) MCG/ACT IN AERS: INHALATION_SPRAY | RESPIRATORY_TRACT | 3 refills | Status: DC

## 2018-04-26 DIAGNOSIS — J449 Chronic obstructive pulmonary disease, unspecified: Secondary | ICD-10-CM | POA: Diagnosis not present

## 2018-05-22 ENCOUNTER — Other Ambulatory Visit: Payer: Self-pay | Admitting: Osteopathic Medicine

## 2018-05-22 DIAGNOSIS — I10 Essential (primary) hypertension: Secondary | ICD-10-CM

## 2018-05-27 DIAGNOSIS — J449 Chronic obstructive pulmonary disease, unspecified: Secondary | ICD-10-CM | POA: Diagnosis not present

## 2018-06-26 DIAGNOSIS — J449 Chronic obstructive pulmonary disease, unspecified: Secondary | ICD-10-CM | POA: Diagnosis not present

## 2018-07-23 ENCOUNTER — Other Ambulatory Visit: Payer: Self-pay | Admitting: Osteopathic Medicine

## 2018-07-23 NOTE — Telephone Encounter (Signed)
Please advise 

## 2018-07-27 DIAGNOSIS — J449 Chronic obstructive pulmonary disease, unspecified: Secondary | ICD-10-CM | POA: Diagnosis not present

## 2018-07-30 ENCOUNTER — Encounter: Payer: Self-pay | Admitting: Osteopathic Medicine

## 2018-07-30 ENCOUNTER — Ambulatory Visit: Payer: BC Managed Care – PPO | Admitting: Osteopathic Medicine

## 2018-07-30 VITALS — BP 115/69 | HR 76 | Temp 98.8°F | Wt 130.9 lb

## 2018-07-30 DIAGNOSIS — R7303 Prediabetes: Secondary | ICD-10-CM

## 2018-07-30 DIAGNOSIS — I10 Essential (primary) hypertension: Secondary | ICD-10-CM | POA: Diagnosis not present

## 2018-07-30 LAB — POCT GLYCOSYLATED HEMOGLOBIN (HGB A1C): Hemoglobin A1C: 6 % — AB (ref 4.0–5.6)

## 2018-07-30 MED ORDER — AMLODIPINE BESYLATE 10 MG PO TABS: 10.0000 mg | ORAL_TABLET | Freq: Every day | ORAL | 3 refills | Status: DC

## 2018-07-30 MED ORDER — LOSARTAN POTASSIUM 50 MG PO TABS: 50.0000 mg | ORAL_TABLET | Freq: Every day | ORAL | 3 refills | Status: DC

## 2018-07-30 MED ORDER — ALBUTEROL SULFATE HFA 108 (90 BASE) MCG/ACT IN AERS: INHALATION_SPRAY | RESPIRATORY_TRACT | 3 refills | Status: DC

## 2018-07-30 MED ORDER — MONTELUKAST SODIUM 10 MG PO TABS: 10.0000 mg | ORAL_TABLET | Freq: Every day | ORAL | 3 refills | Status: DC

## 2018-07-30 MED ORDER — MELOXICAM 15 MG PO TABS: 15.0000 mg | ORAL_TABLET | Freq: Every day | ORAL | 3 refills | Status: DC

## 2018-07-30 NOTE — Progress Notes (Signed)
HPI: Sherri Gibson is a 58 y.o. female who  has a past medical history of Breast cancer ([place]) (2016), COPD (chronic obstructive pulmonary disease) ([place]), High blood pressure, and High cholesterol.  she presents to [facility] today, 07/30/18,  for chief complaint of:  Prediabetes follow-up   A1C   09/2016: 6.3%  05/2017: 6.5%  11/2017: 6.3%  Today: 07/30/18: 6.0%  Doing well on current meds.  No other concerns today Would like labs done since she is fasting today        At today's visit 07/30/18 ... PMH, PSH, FH reviewed and updated as needed.  Current medication list and allergy/intolerance hx reviewed and updated as needed. (See remainder of HPI, ROS, Phys Exam below)   No results found.  No results found for this or any previous visit (from the past 72 hour(s)).        ASSESSMENT/PLAN: The primary encounter diagnosis was Essential hypertension. A diagnosis of Prediabetes was also pertinent to this visit.   Orders Placed This Encounter  Procedures  . CBC  . COMPLETE METABOLIC PANEL WITH GFR  . Lipid panel  . VITAMIN D 25 Hydroxy (Vit-D Deficiency, Fractures)  . POCT HgB A1C     Meds ordered this encounter  Medications  . albuterol (PROAIR HFA) 108 (90 Base) MCG/ACT inhaler    Sig: inhale 2 puff by inhalation route  every 4 - 6 hours as needed    Dispense:  18 g    Refill:  3  . amLODipine (NORVASC) 10 MG tablet    Sig: Take 1 tablet (10 mg total) by mouth daily.    Dispense:  90 tablet    Refill:  3  . losartan (COZAAR) 50 MG tablet    Sig: Take 1 tablet (50 mg total) by mouth daily.    Dispense:  90 tablet    Refill:  3  . meloxicam (MOBIC) 15 MG tablet    Sig: Take 1 tablet (15 mg total) by mouth daily.    Dispense:  90 tablet    Refill:  3  . montelukast (SINGULAIR) 10 MG tablet    Sig: Take 1 tablet (10 mg total) by mouth at bedtime.    Dispense:  90 tablet    Refill:  3      Follow-up  plan: Return in about 6 months (around 01/30/2019) for ANNUAL (call week prior to visit for lab orders).                                                 ################################################# ################################################# ################################################# #################################################    Current Meds  Medication Sig  . albuterol (PROAIR HFA) 108 (90 Base) MCG/ACT inhaler inhale 2 puff by inhalation route  every 4 - 6 hours as needed  . albuterol (PROVENTIL) (2.5 MG/3ML) 0.083% nebulizer solution Take 3 mLs (2.5 mg total) by nebulization every 6 (six) hours as needed for Wheezing.  Marland Kitchen amLODipine (NORVASC) 10 MG tablet TAKE 1 TABLET DAILY  . anastrozole (ARIMIDEX) 1 MG tablet TAKE 1 TABLET ONCE A DAY  . aspirin 81 MG chewable tablet Chew by mouth.  Marland Kitchen atorvastatin (LIPITOR) 40 MG tablet Take 1 tablet (40 mg total) by mouth daily.  . betamethasone dipropionate (DIPROLENE) 0.05 % cream Apply topically 2 (two) times daily. To affected area(s) as needed  .  budesonide-formoterol (SYMBICORT) 160-4.5 MCG/ACT inhaler Inhale 2 puffs into the lungs 2 (two) times daily.  . colchicine 0.6 MG tablet Take 1 tablet (0.6 mg total) by mouth daily.  . cyclobenzaprine (FLEXERIL) 10 MG tablet TAKE 1 TABLET THREE TIMES A DAY AS NEEDED FOR MUSCLE SPASMS  . losartan (COZAAR) 50 MG tablet TAKE 1 TABLET DAILY  . meloxicam (MOBIC) 15 MG tablet Take 1 tablet (15 mg total) by mouth daily.  . metFORMIN (GLUCOPHAGE) 500 MG tablet Take 1 tablet (500 mg total) by mouth daily with breakfast.  . montelukast (SINGULAIR) 10 MG tablet Take 1 tablet (10 mg total) by mouth at bedtime.  . Tiotropium Bromide Monohydrate 2.5 MCG/ACT AERS Inhale 1 puff into the lungs 2 (two) times daily.  . traZODone (DESYREL) 50 MG tablet TAKE 1 TABLET BY MOUTH EVERYDAY AT BEDTIME    Allergies  Allergen Reactions  . Codeine Itching     Feels like something crawling on her skin   . Tetanus Toxoids Other (See Comments)    Patient stated,"I was given a Pneumonia shot at the same time I got the Tetanus shot, in the same arm."       Review of Systems:  Constitutional: No recent illness, no fever  HEENT: No  headache, no vision change  Cardiac: No  chest pain, No  pressure, No palpitations  Respiratory:  No  shortness of breath. No  Cough  Gastrointestinal: No  abdominal pain  Musculoskeletal: No new myalgia/arthralgia  Neurologic: No  weakness, No  Dizziness  Psychiatric: No  concerns with depression, No  concerns with anxiety  Exam:  BP 115/69 (BP Location: Left Arm, Patient Position: Sitting, Cuff Size: Normal)   Pulse 76   Temp 98.8 F (37.1 C) (Oral)   Wt 130 lb 14.4 oz (59.4 kg)   BMI 26.44 kg/m   Constitutional: VS see above. General Appearance: alert, well-developed, well-nourished, NAD  Eyes: Normal lids and conjunctive, non-icteric sclera  Neck: No masses, trachea midline.   Respiratory: Normal respiratory effort. no wheeze, no rhonchi, no rales  Cardiovascular: S1/S2 normal, no murmur, no rub/gallop auscultated. RRR.   Musculoskeletal: Gait normal. Symmetric and independent movement of all extremities  Neurological: Normal balance/coordination. No tremor.  Skin: warm, dry, intact.   Psychiatric: Normal judgment/insight. Normal mood and affect. Oriented x3.       Visit summary with medication list and pertinent instructions was printed for patient to review, patient was advised to alert [name] if any updates are needed. All questions at time of visit were answered - patient instructed to contact office with any additional concerns. ER/RTC precautions were reviewed with the patient and understanding verbalized.     Please note: voice recognition software was used to produce this document, and typos may escape review. Please contact Dr. Sheppard Coil for any needed clarifications.    Follow  up plan: Return in about 6 months (around 01/30/2019) for [place] (call week prior to visit for lab orders).

## 2018-07-31 ENCOUNTER — Telehealth: Payer: Self-pay

## 2018-07-31 LAB — LIPID PANEL
Cholesterol: 162 mg/dL (ref <200)
HDL: 56 mg/dL (ref >=50)
LDL Cholesterol (Calc): 86 mg/dL (calc)
Non-HDL Cholesterol (Calc): 106 mg/dL (calc) (ref <130)
Total CHOL/HDL Ratio: 2.9 (calc) (ref <5.0)
Triglycerides: 108 mg/dL (ref <150)

## 2018-07-31 LAB — COMPLETE METABOLIC PANEL WITH GFR
AG Ratio: 2 (calc) (ref 1.0–2.5)
ALT: 25 U/L (ref 6–29)
AST: 21 U/L (ref 10–35)
Albumin: 4.7 g/dL (ref 3.6–5.1)
Alkaline phosphatase (APISO): 62 U/L (ref 37–153)
BUN: 18 mg/dL (ref 7–25)
CO2: 28 mmol/L (ref 20–32)
Calcium: 9.7 mg/dL (ref 8.6–10.4)
Chloride: 105 mmol/L (ref 98–110)
Creat: 0.88 mg/dL (ref 0.50–1.05)
GFR, Est African American: 84 mL/min/{1.73_m2} (ref >=60)
GFR, Est Non African American: 72 mL/min/{1.73_m2} (ref >=60)
Globulin: 2.4 g/dL (calc) (ref 1.9–3.7)
Glucose, Bld: 113 mg/dL — ABNORMAL HIGH (ref 65–99)
Potassium: 4.7 mmol/L (ref 3.5–5.3)
Sodium: 141 mmol/L (ref 135–146)
Total Bilirubin: 0.5 mg/dL (ref 0.2–1.2)
Total Protein: 7.1 g/dL (ref 6.1–8.1)

## 2018-07-31 LAB — VITAMIN D 25 HYDROXY (VIT D DEFICIENCY, FRACTURES): Vit D, 25-Hydroxy: 91 ng/mL (ref 30–100)

## 2018-07-31 LAB — CBC
HCT: 39.7 % (ref 35.0–45.0)
Hemoglobin: 13.4 g/dL (ref 11.7–15.5)
MCH: 29.6 pg (ref 27.0–33.0)
MCHC: 33.8 g/dL (ref 32.0–36.0)
MCV: 87.6 fL (ref 80.0–100.0)
MPV: 10.9 fL (ref 7.5–12.5)
Platelets: 331 10*3/uL (ref 140–400)
RBC: 4.53 10*6/uL (ref 3.80–5.10)
RDW: 12.8 % (ref 11.0–15.0)
WBC: 7.5 10*3/uL (ref 3.8–10.8)

## 2018-07-31 MED ORDER — ALBUTEROL SULFATE (2.5 MG/3ML) 0.083% IN NEBU: 2.5000 mg | INHALATION_SOLUTION | RESPIRATORY_TRACT | 99 refills | Status: DC | PRN

## 2018-07-31 NOTE — Telephone Encounter (Signed)
Pt has been updated of med refill. No other inquiries during the call.

## 2018-07-31 NOTE — Telephone Encounter (Signed)
Pt called stating she needs a med refill for albuterol nebulizer solution. Written by historical provider. Pls send to 90 Day supply to [facility].

## 2018-07-31 NOTE — Telephone Encounter (Signed)
Sent!

## 2018-08-13 ENCOUNTER — Telehealth: Payer: Self-pay

## 2018-08-13 DIAGNOSIS — Z20828 Contact with and (suspected) exposure to other viral communicable diseases: Secondary | ICD-10-CM | POA: Diagnosis not present

## 2018-08-13 NOTE — Telephone Encounter (Signed)
Pt left a vm msg stating she is feeling unwell. Her husband checked her b/s earlier today, fasting results were 178. As per pt, she feels shaky and has nasal congestion. Requesting recommendation from provider. Pls advise, thanks.

## 2018-08-13 NOTE — Telephone Encounter (Signed)
Can we get her set up with virtual visit?  Thank you

## 2018-08-13 NOTE — Telephone Encounter (Addendum)
Pls clarify - Are they going to test for COVID 19? Thanks.

## 2018-08-13 NOTE — Telephone Encounter (Signed)
I called pt and she states that her and her husband are both going to get tested.

## 2018-08-14 NOTE — Telephone Encounter (Signed)
Noted  

## 2018-08-14 NOTE — Telephone Encounter (Signed)
Yes, patient informed me yesterday that she and her husband was going to get tested for Covid19 to be on the safe side

## 2018-08-14 NOTE — Telephone Encounter (Signed)
Thanks for the update. Forwarding to provider as an Pharmacist, hospital.

## 2018-08-19 DIAGNOSIS — E119 Type 2 diabetes mellitus without complications: Secondary | ICD-10-CM | POA: Diagnosis not present

## 2018-08-19 DIAGNOSIS — M2011 Hallux valgus (acquired), right foot: Secondary | ICD-10-CM | POA: Diagnosis not present

## 2018-08-19 DIAGNOSIS — M2012 Hallux valgus (acquired), left foot: Secondary | ICD-10-CM | POA: Diagnosis not present

## 2018-08-19 DIAGNOSIS — B070 Plantar wart: Secondary | ICD-10-CM | POA: Diagnosis not present

## 2018-08-26 DIAGNOSIS — J449 Chronic obstructive pulmonary disease, unspecified: Secondary | ICD-10-CM | POA: Diagnosis not present

## 2018-08-28 DIAGNOSIS — N281 Cyst of kidney, acquired: Secondary | ICD-10-CM | POA: Diagnosis not present

## 2018-09-16 DIAGNOSIS — B070 Plantar wart: Secondary | ICD-10-CM | POA: Diagnosis not present

## 2018-09-16 DIAGNOSIS — M2011 Hallux valgus (acquired), right foot: Secondary | ICD-10-CM | POA: Diagnosis not present

## 2018-09-16 DIAGNOSIS — E119 Type 2 diabetes mellitus without complications: Secondary | ICD-10-CM | POA: Diagnosis not present

## 2018-09-16 DIAGNOSIS — M2012 Hallux valgus (acquired), left foot: Secondary | ICD-10-CM | POA: Diagnosis not present

## 2018-09-17 ENCOUNTER — Other Ambulatory Visit: Payer: Self-pay | Admitting: Osteopathic Medicine

## 2018-09-26 DIAGNOSIS — J449 Chronic obstructive pulmonary disease, unspecified: Secondary | ICD-10-CM | POA: Diagnosis not present

## 2018-10-09 ENCOUNTER — Ambulatory Visit: Payer: BC Managed Care – PPO | Admitting: Osteopathic Medicine

## 2018-10-09 ENCOUNTER — Encounter: Payer: Self-pay | Admitting: Osteopathic Medicine

## 2018-10-09 ENCOUNTER — Other Ambulatory Visit: Payer: Self-pay

## 2018-10-09 VITALS — BP 115/71 | HR 77 | Temp 98.1°F | Wt 131.0 lb

## 2018-10-09 DIAGNOSIS — R21 Rash and other nonspecific skin eruption: Secondary | ICD-10-CM

## 2018-10-09 MED ORDER — BETAMETHASONE DIPROPIONATE 0.05 % EX CREA: TOPICAL_CREAM | Freq: Two times a day (BID) | CUTANEOUS | 1 refills | Status: DC

## 2018-10-09 NOTE — Progress Notes (Signed)
HPI: Sherri Gibson is a 58 y.o. female who  has a past medical history of Breast cancer ([place]) (2016), COPD (chronic obstructive pulmonary disease) ([place]), High blood pressure, and High cholesterol.  she presents to [facility] today, 10/09/18,  for chief complaint of:  Rash  . Context: was working in her yard and this appeared . Location: both forearms . Quality: red bumps, not itching  . Duration: 5 days  . She tried expired steroid cream once but then got nervous to continue     At today's visit 10/09/18 ... PMH, PSH, FH reviewed and updated as needed.  Current medication list and allergy/intolerance hx reviewed and updated as needed. (See remainder of HPI, ROS, Phys Exam below)   No results found.  No results found for this or any previous visit (from the past 72 hour(s)).        ASSESSMENT/PLAN: The encounter diagnosis was Rash and nonspecific skin eruption.    Meds ordered this encounter  Medications  . betamethasone dipropionate (DIPROLENE) 0.05 % cream    Sig: Apply topically 2 (two) times daily. To affected area(s) as needed    Dispense:  15 g    Refill:  1      Follow-up plan: Return if symptoms worsen or fail to improve, for [place].                                                 ################################################# ################################################# ################################################# #################################################    Current Meds  Medication Sig  . albuterol (PROAIR HFA) 108 (90 Base) MCG/ACT inhaler inhale 2 puff by inhalation route  every 4 - 6 hours as needed  . albuterol (PROVENTIL) (2.5 MG/3ML) 0.083% nebulizer solution Take 3 mLs (2.5 mg total) by nebulization every 2 (two) hours as needed for wheezing or shortness of breath.  Marland Kitchen amLODipine (NORVASC) 10 MG tablet Take  1 tablet (10 mg total) by mouth daily.  Marland Kitchen anastrozole (ARIMIDEX) 1 MG tablet TAKE 1 TABLET ONCE A DAY  . aspirin 81 MG chewable tablet Chew by mouth.  Marland Kitchen atorvastatin (LIPITOR) 40 MG tablet Take 1 tablet (40 mg total) by mouth daily.  . betamethasone dipropionate (DIPROLENE) 0.05 % cream Apply topically 2 (two) times daily. To affected area(s) as needed  . budesonide-formoterol (SYMBICORT) 160-4.5 MCG/ACT inhaler Inhale 2 puffs into the lungs 2 (two) times daily.  . cyclobenzaprine (FLEXERIL) 10 MG tablet TAKE 1 TABLET THREE TIMES A DAY AS NEEDED FOR MUSCLE SPASMS  . losartan (COZAAR) 50 MG tablet Take 1 tablet (50 mg total) by mouth daily.  . meloxicam (MOBIC) 15 MG tablet Take 1 tablet (15 mg total) by mouth daily.  . metFORMIN (GLUCOPHAGE) 500 MG tablet Take 1 tablet (500 mg total) by mouth daily with breakfast.  . MITIGARE 0.6 MG CAPS TAKE 1 CAPSULE DAILY  . montelukast (SINGULAIR) 10 MG tablet Take 1 tablet (10 mg total) by mouth at bedtime.  . Tiotropium Bromide Monohydrate 2.5 MCG/ACT AERS Inhale 1 puff into the lungs 2 (two) times daily.  . traZODone (DESYREL) 50 MG tablet TAKE 1 TABLET BY MOUTH EVERYDAY AT BEDTIME  . [DISCONTINUED] betamethasone dipropionate (DIPROLENE) 0.05 % cream Apply topically 2 (two) times daily. To affected area(s) as needed    Allergies  Allergen Reactions  . Codeine Itching    Feels  like something crawling on her skin   . Tetanus Toxoids Other (See Comments)    Patient stated,"I was given a Pneumonia shot at the same time I got the Tetanus shot, in the same arm."       Review of Systems:  Constitutional: No recent illness  HEENT: No  headache  Cardiac: No  chest pain  Respiratory:  No  shortness of breath.   Skin: +Rash  Neurologic: No  weakness, No  Dizziness  Exam:  BP 115/71 (BP Location: Left Arm, Patient Position: Sitting, Cuff Size: Normal)   Pulse 77   Temp 98.1 F (36.7 C) (Oral)   Wt 131 lb (59.4 kg)   BMI 26.46 kg/m    Constitutional: VS see above. General Appearance: alert, well-developed, well-nourished, NAD  Eyes: Normal lids and conjunctive, non-icteric sclera  Neck: No masses, trachea midline.   Respiratory: Normal respiratory effort  Musculoskeletal: Gait normal.  Neurological: Normal balance/coordination. No tremor.  Skin: warm, dry, intact. Mild papular rash on arms, faint, skin is tan   Psychiatric: Normal judgment/insight. Normal mood and affect. Oriented x3.       Visit summary with medication list and pertinent instructions was printed for patient to review, patient was advised to alert [name] if any updates are needed. All questions at time of visit were answered - patient instructed to contact office with any additional concerns. ER/RTC precautions were reviewed with the patient and understanding verbalized.    Please note: voice recognition software was used to produce this document, and typos may escape review. Please contact Dr. Sheppard Coil for any needed clarifications.    Follow up plan: Return if symptoms worsen or fail to improve, for [place].

## 2018-10-16 DIAGNOSIS — M2012 Hallux valgus (acquired), left foot: Secondary | ICD-10-CM | POA: Diagnosis not present

## 2018-10-16 DIAGNOSIS — M2011 Hallux valgus (acquired), right foot: Secondary | ICD-10-CM | POA: Diagnosis not present

## 2018-10-16 DIAGNOSIS — B070 Plantar wart: Secondary | ICD-10-CM | POA: Diagnosis not present

## 2018-10-16 DIAGNOSIS — E119 Type 2 diabetes mellitus without complications: Secondary | ICD-10-CM | POA: Diagnosis not present

## 2018-10-27 DIAGNOSIS — J449 Chronic obstructive pulmonary disease, unspecified: Secondary | ICD-10-CM | POA: Diagnosis not present

## 2018-11-05 ENCOUNTER — Other Ambulatory Visit: Payer: Self-pay | Admitting: Osteopathic Medicine

## 2018-11-20 DIAGNOSIS — B070 Plantar wart: Secondary | ICD-10-CM | POA: Diagnosis not present

## 2018-11-20 DIAGNOSIS — E119 Type 2 diabetes mellitus without complications: Secondary | ICD-10-CM | POA: Diagnosis not present

## 2018-11-20 DIAGNOSIS — M2012 Hallux valgus (acquired), left foot: Secondary | ICD-10-CM | POA: Diagnosis not present

## 2018-11-20 DIAGNOSIS — M2011 Hallux valgus (acquired), right foot: Secondary | ICD-10-CM | POA: Diagnosis not present

## 2018-11-26 DIAGNOSIS — J449 Chronic obstructive pulmonary disease, unspecified: Secondary | ICD-10-CM | POA: Diagnosis not present

## 2018-12-03 ENCOUNTER — Other Ambulatory Visit: Payer: Self-pay

## 2018-12-03 DIAGNOSIS — R7303 Prediabetes: Secondary | ICD-10-CM

## 2018-12-03 MED ORDER — METFORMIN HCL 500 MG PO TABS: 500.0000 mg | ORAL_TABLET | Freq: Every day | ORAL | 1 refills | Status: DC

## 2018-12-16 ENCOUNTER — Other Ambulatory Visit: Payer: Self-pay | Admitting: Pulmonary Disease

## 2018-12-19 ENCOUNTER — Inpatient Hospital Stay: Payer: BC Managed Care – PPO | Attending: Hematology | Admitting: Hematology

## 2018-12-19 ENCOUNTER — Inpatient Hospital Stay: Payer: BC Managed Care – PPO

## 2018-12-19 ENCOUNTER — Other Ambulatory Visit: Payer: Self-pay

## 2018-12-19 ENCOUNTER — Encounter: Payer: Self-pay | Admitting: Hematology

## 2018-12-19 ENCOUNTER — Encounter: Payer: Self-pay | Admitting: *Deleted

## 2018-12-19 VITALS — BP 116/63 | HR 80 | Temp 98.7°F | Resp 17

## 2018-12-19 DIAGNOSIS — Z79899 Other long term (current) drug therapy: Secondary | ICD-10-CM | POA: Diagnosis not present

## 2018-12-19 DIAGNOSIS — Z7984 Long term (current) use of oral hypoglycemic drugs: Secondary | ICD-10-CM | POA: Diagnosis not present

## 2018-12-19 DIAGNOSIS — Z1211 Encounter for screening for malignant neoplasm of colon: Secondary | ICD-10-CM

## 2018-12-19 DIAGNOSIS — Z791 Long term (current) use of non-steroidal anti-inflammatories (NSAID): Secondary | ICD-10-CM | POA: Diagnosis not present

## 2018-12-19 DIAGNOSIS — M25511 Pain in right shoulder: Secondary | ICD-10-CM | POA: Diagnosis not present

## 2018-12-19 DIAGNOSIS — Z7951 Long term (current) use of inhaled steroids: Secondary | ICD-10-CM | POA: Insufficient documentation

## 2018-12-19 DIAGNOSIS — M8580 Other specified disorders of bone density and structure, unspecified site: Secondary | ICD-10-CM | POA: Diagnosis not present

## 2018-12-19 DIAGNOSIS — C50912 Malignant neoplasm of unspecified site of left female breast: Secondary | ICD-10-CM | POA: Diagnosis not present

## 2018-12-19 DIAGNOSIS — M25512 Pain in left shoulder: Secondary | ICD-10-CM | POA: Diagnosis not present

## 2018-12-19 DIAGNOSIS — Z79811 Long term (current) use of aromatase inhibitors: Secondary | ICD-10-CM | POA: Diagnosis not present

## 2018-12-19 DIAGNOSIS — Z170 Estrogen receptor positive status [ER+]: Secondary | ICD-10-CM | POA: Insufficient documentation

## 2018-12-19 LAB — CMP (CANCER CENTER ONLY)
ALT: 23 U/L (ref 0–44)
AST: 21 U/L (ref 15–41)
Albumin: 4.8 g/dL (ref 3.5–5.0)
Alkaline Phosphatase: 63 U/L (ref 38–126)
Anion gap: 8 (ref 5–15)
BUN: 17 mg/dL (ref 6–20)
CO2: 30 mmol/L (ref 22–32)
Calcium: 10.1 mg/dL (ref 8.9–10.3)
Chloride: 101 mmol/L (ref 98–111)
Creatinine: 0.98 mg/dL (ref 0.44–1.00)
GFR, Est AFR Am: >60 mL/min (ref >60)
GFR, Estimated: >60 mL/min (ref >60)
Glucose, Bld: 206 mg/dL — ABNORMAL HIGH (ref 70–99)
Potassium: 4.5 mmol/L (ref 3.5–5.1)
Sodium: 139 mmol/L (ref 135–145)
Total Bilirubin: 0.5 mg/dL (ref 0.3–1.2)
Total Protein: 7.6 g/dL (ref 6.5–8.1)

## 2018-12-19 LAB — CBC WITH DIFFERENTIAL (CANCER CENTER ONLY)
Abs Immature Granulocytes: 0.02 10*3/uL (ref 0.00–0.07)
Basophils Absolute: 0 10*3/uL (ref 0.0–0.1)
Basophils Relative: 0 %
Eosinophils Absolute: 0.1 10*3/uL (ref 0.0–0.5)
Eosinophils Relative: 2 %
HCT: 41 % (ref 36.0–46.0)
Hemoglobin: 13.1 g/dL (ref 12.0–15.0)
Immature Granulocytes: 0 %
Lymphocytes Relative: 20 %
Lymphs Abs: 1.2 10*3/uL (ref 0.7–4.0)
MCH: 28.6 pg (ref 26.0–34.0)
MCHC: 32 g/dL (ref 30.0–36.0)
MCV: 89.5 fL (ref 80.0–100.0)
Monocytes Absolute: 0.5 10*3/uL (ref 0.1–1.0)
Monocytes Relative: 8 %
Neutro Abs: 4.1 10*3/uL (ref 1.7–7.7)
Neutrophils Relative %: 70 %
Platelet Count: 332 10*3/uL (ref 150–400)
RBC: 4.58 MIL/uL (ref 3.87–5.11)
RDW: 12.9 % (ref 11.5–15.5)
WBC Count: 6 10*3/uL (ref 4.0–10.5)
nRBC: 0 % (ref 0.0–0.2)

## 2018-12-19 MED ORDER — ANASTROZOLE 1 MG PO TABS: 1.0000 mg | ORAL_TABLET | Freq: Every day | ORAL | 3 refills | Status: AC

## 2018-12-19 NOTE — Progress Notes (Signed)
[name] OFFICE PROGRESS NOTE  Patient Care Team: Emeterio Reeve, DO as PCP - General (Osteopathic Medicine)  HEME/ONC OVERVIEW: 1. 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, ER/PR+, HER2- -Patient is currently tolerating Arimidex well without significant side effects -On exam, she has no evidence of disease recurrence -I have ordered screening MMG for 03/2019 -I have also refilled Arimidex x 1 year -As she had very early stage breast cancer, 5 years of adjuvant AI should be sufficient, especially weighing the risk of worsening osteopenia -If no evidence of disease recurrence at the next visit, we can discontinue Arimidex   Osteopenia -Follow-up DEXA scan in 03/2018 showed persistent osteopenia -Currently on Ca-Vit D supplement BID -I encouraged the patient to follow up with PCP regarding further management of osteopenia  Hx of abnormal colonoscopy -Patient reports having had "abnormal colonoscopy" in the past, and was recommended to have repeat colonoscopy in 1 year, [place] -No symptoms of GI bleeding -I have referred the patient to gastroenterology for colon cancer screening   Orders Placed This Encounter  Procedures  . MM Digital Diagnostic Bilat    Standing Status:   Future    Standing Expiration Date:   12/19/2019    Order Specific Question:   Reason for Exam (SYMPTOM  OR DIAGNOSIS REQUIRED)    Answer:   History of breast cancer, surveillance    Order Specific Question:   Is the patient pregnant?    Answer:   No    Order  Specific Question:   Preferred imaging location?    Answer:   [facility]. CBC w/ diff    Standing Status:   Future    Standing Expiration Date:   01/23/2020  . CMP    Standing Status:   Future    Standing Expiration Date:   01/23/2020  . Ambulatory referral to Gastroenterology    Referral Priority:   Routine    Referral Type:   Consultation    Referral Reason:   Specialty Services Required    Referred to Provider:   CiLavena BullionDO    Number of Visits Requested:   1    All questions were answered. The patient knows to call the clinic with any problems, questions or concerns. No barriers to learning was detected.  Return in 1 year for labs and clinic follow-up.   [name] 12/19/2018 10:22 AM  CHIEF COMPLAINT: "I am doing fine"  INTERVAL HISTORY: Sherri Gibson to clinic for follow-up with history of Stage I breast cancer follow-up Arimidex.  Patient reports that she has had intermittent, chronic, bilateral shoulder ache, for the past several years, but she denies any worsening myalgia or arthralgia on Arimidex.  She is compliant with calcium-vitamin D supplement twice a day, and the performs periodic self breast exam.  She [place]oscopy in 1 year, but she has not had any repeat procedure for several years.  Clinically,  she denies any abdominal pain, nausea, vomiting, diarrhea, hematochezia, or melena.  She denies any other complaint today.  REVIEW OF SYSTEMS:   Constitutional: ( - ) fevers, ( - )  chills , ( - ) night sweats Eyes: ( - ) blurriness of vision, ( - ) double vision, ( - ) watery eyes Ears, nose, mouth, throat, and face: ( - ) mucositis, ( - ) sore throat Respiratory: ( - ) cough, ( - ) dyspnea, ( - ) wheezes Cardiovascular: ( - ) palpitation, ( - ) chest discomfort, ( - ) lower extremity swelling Gastrointestinal:  ( - )  nausea, ( - ) heartburn, ( - ) change in bowel habits Skin: ( - ) abnormal skin rashes Lymphatics: ( - ) new lymphadenopathy, ( - ) easy bruising Neurological: ( - ) numbness, ( - ) tingling, ( - ) new weaknesses Behavioral/Psych: ( - ) mood change, ( - ) new changes  All other systems were reviewed with the patient and are negative.  SUMMARY OF ONCOLOGIC HISTORY: Oncology History   No history exists.    I have reviewed the past medical history, past surgical history, social history and family history with the patient and they are unchanged from previous note.  ALLERGIES:  is allergic to codeine and tetanus toxoids.  MEDICATIONS:  Current Outpatient Medications  Medication Sig Dispense Refill  . albuterol (PROAIR HFA) 108 (90 Base) MCG/ACT inhaler inhale 2 puff by inhalation route  every 4 - 6 hours as needed 18 g 3  . albuterol (PROVENTIL) (2.5 MG/3ML) 0.083% nebulizer solution Take 3 mLs (2.5 mg total) by nebulization every 2 (two) hours as needed for wheezing or shortness of breath. 75 mL 99  . amLODipine (NORVASC) 10 MG tablet Take 1 tablet (10 mg total) by mouth daily. 90 tablet 3  . anastrozole (ARIMIDEX) 1 MG tablet Take 1 tablet (1 mg total) by mouth daily. 90 tablet 3  . aspirin 81 MG chewable tablet Chew by mouth.    Marland Kitchen atorvastatin (LIPITOR) 40 MG tablet TAKE 1 TABLET DAILY (DISCONTINUE PRAVASTATIN) 90 tablet 3  . betamethasone dipropionate (DIPROLENE) 0.05 % cream Apply topically 2 (two) times daily. To affected area(s) as needed 15 g 1  . budesonide-formoterol (SYMBICORT) 160-4.5 MCG/ACT inhaler Inhale 2 puffs into the lungs 2 (two) times daily. 3 Inhaler 3  . Calcium-Magnesium-Vitamin D (CALCIUM 1200+D3 PO) Take 1 capsule by mouth 2 (two) times daily.    . cyclobenzaprine (FLEXERIL) 10 MG tablet TAKE 1 TABLET THREE TIMES A DAY AS NEEDED FOR MUSCLE SPASMS 30 tablet 1  . losartan (COZAAR) 50 MG tablet Take 1 tablet (50 mg total) by mouth daily. 90 tablet 3  . meloxicam  (MOBIC) 15 MG tablet Take 1 tablet (15 mg total) by mouth daily. 90 tablet 3  . metFORMIN (GLUCOPHAGE) 500 MG tablet Take 1 tablet (500 mg total) by mouth daily with breakfast. 90 tablet 1  . MITIGARE 0.6 MG CAPS TAKE 1 CAPSULE DAILY 90 capsule 3  . montelukast (SINGULAIR) 10 MG tablet Take 1 tablet (10 mg total) by mouth at bedtime. 90 tablet 3  . SPIRIVA RESPIMAT 2.5 MCG/ACT AERS USE 1 INHALATION TWICE A DAY 12 g 0  . traZODone (DESYREL) 50 MG tablet TAKE 1 TABLET BY MOUTH EVERYDAY AT BEDTIME 90 tablet 0   No current facility-administered medications for this visit.     PHYSICAL EXAMINATION: ECOG PERFORMANCE STATUS: 0 - Asymptomatic  Today's Vitals   12/19/18 0900 12/19/18 0762  BP:  116/63  Pulse:  80  Resp:  17  Temp:  98.7 F (37.1 C)  TempSrc:  Tympanic  SpO2:  98%  PainSc: 0-No pain    There is no height or weight on file to calculate BMI.  There were no vitals filed for this visit.  GENERAL: alert, no distress and comfortable SKIN: skin color, texture, turgor are normal, no rashes or significant lesions EYES: conjunctiva are pink and non-injected, sclera clear NECK: supple, non-tender LYMPH:  no palpable lymphadenopathy in the cervical or axillary LUNGS: clear to auscultation with normal breathing effort HEART: regular rate & rhythm and no murmurs and no lower extremity edema ABDOMEN: soft, non-tender, non-distended, normal bowel sounds Musculoskeletal: no cyanosis of digits and no clubbing  PSYCH: alert & oriented x 3, fluent speech BREAST: no evidence of mass or nodule palpated  LABORATORY DATA:  I have reviewed the data as listed    Component Value Date/Time   NA 139 12/19/2018 0933   NA 139 10/09/2017   K 4.5 12/19/2018 0933   CL 101 12/19/2018 0933   CL 103 10/09/2017   CO2 30 12/19/2018 0933   CO2 22 10/09/2017   GLUCOSE 206 (H) 12/19/2018 0933   BUN 17 12/19/2018 0933   BUN 17 10/09/2017   CREATININE 0.98 12/19/2018 0933   CREATININE 0.88  07/30/2018 0933   CALCIUM 10.1 12/19/2018 0933   CALCIUM 9.2 10/09/2017   PROT 7.6 12/19/2018 0933   PROT 7.1 10/09/2017   ALBUMIN 4.8 12/19/2018 0933   ALBUMIN 4.2 10/09/2017   AST 21 12/19/2018 0933   ALT 23 12/19/2018 0933   ALKPHOS 63 12/19/2018 0933   BILITOT 0.5 12/19/2018 0933   GFRNONAA >60 12/19/2018 0933   GFRNONAA 72 07/30/2018 0933   GFRAA >60 12/19/2018 0933   GFRAA 84 07/30/2018 0933    No results found for: SPEP, UPEP  Lab Results  Component Value Date   WBC 6.0 12/19/2018   NEUTROABS 4.1 12/19/2018   HGB 13.1 12/19/2018   HCT 41.0 12/19/2018   MCV 89.5 12/19/2018   PLT 332 12/19/2018      Chemistry      Component Value Date/Time   NA 139 12/19/2018 0933   NA 139 10/09/2017   K 4.5 12/19/2018 0933   CL 101 12/19/2018 0933   CL 103 10/09/2017   CO2 30 12/19/2018 0933   CO2 22 10/09/2017   BUN 17 12/19/2018 0933   BUN 17 10/09/2017   CREATININE 0.98 12/19/2018 0933   CREATININE 0.88 07/30/2018 0933   GLU 119 10/09/2017      Component Value Date/Time   CALCIUM 10.1 12/19/2018 0933   CALCIUM 9.2 10/09/2017   ALKPHOS 63 12/19/2018 0933   AST 21 12/19/2018 0933   ALT 23 12/19/2018 0933   BILITOT 0.5 12/19/2018 0933       RADIOGRAPHIC STUDIES: I have personally reviewed the radiological images as listed below and agreed with the findings in the report. No results found.

## 2018-12-22 ENCOUNTER — Ambulatory Visit (INDEPENDENT_AMBULATORY_CARE_PROVIDER_SITE_OTHER): Payer: BC Managed Care – PPO | Admitting: Pulmonary Disease

## 2018-12-22 ENCOUNTER — Encounter: Payer: Self-pay | Admitting: Pulmonary Disease

## 2018-12-22 ENCOUNTER — Other Ambulatory Visit: Payer: Self-pay

## 2018-12-22 VITALS — BP 110/58 | HR 77 | Temp 97.1°F | Ht 59.0 in | Wt 131.0 lb

## 2018-12-22 DIAGNOSIS — R918 Other nonspecific abnormal finding of lung field: Secondary | ICD-10-CM

## 2018-12-22 DIAGNOSIS — J449 Chronic obstructive pulmonary disease, unspecified: Secondary | ICD-10-CM

## 2018-12-22 DIAGNOSIS — J9611 Chronic respiratory failure with hypoxia: Secondary | ICD-10-CM | POA: Insufficient documentation

## 2018-12-22 MED ORDER — SPIRIVA RESPIMAT 2.5 MCG/ACT IN AERS: 2.0000 | INHALATION_SPRAY | Freq: Every day | RESPIRATORY_TRACT | 6 refills | Status: DC

## 2018-12-22 MED ORDER — SPIRIVA RESPIMAT 2.5 MCG/ACT IN AERS: 2.0000 | INHALATION_SPRAY | Freq: Every day | RESPIRATORY_TRACT | 0 refills | Status: DC

## 2018-12-22 MED ORDER — SPIRIVA RESPIMAT 2.5 MCG/ACT IN AERS: 2.0000 | INHALATION_SPRAY | Freq: Every day | RESPIRATORY_TRACT | 3 refills | Status: DC

## 2018-12-22 NOTE — Progress Notes (Signed)
Subjective:   PATIENT ID: Sherri Gibson GENDER: female DOB: August 25, 1960, MRN: [id number]   HPI  Chief Complaint  Patient presents with  . Follow-up    oxygen 2L all time   Reason for Visit: Follow-up; former [facility] patient  [name] is a 58 year old with severe COPD, chronic hypoxemic respiratory failure, multiple pulmonary nodules and stage I (pT1pN0Mx) IDC of the left breast (ER/PR+, HER2-) without recurrence presents for follow-up  She is a former Dr. Lake Gibson patient, last seen September 2019.  She was initially diagnosed with COPD in 2010 and has been on oxygen since 2017. She has been compliant with her Symbicort 160-4.5 mcg 2 puffs twice daily and Spiriva 2.5 mcg 2 puffs daily. She has dyspnea with heavy exertion associated with occasional exertion. Denies chronic cough. Since she has quit smoking, she uses her albuterol 2-3 times in the last month. She has been wearing her oxygen 2L with exertion and sleep. With exertion her O2 will drop to 87% at home off oxygen. She lives in a 3 story house and will need to wear her oxygen with it. She walks regularly on her treadmill.  Social History: She smoked for many years, typically 2 packs per day from age [age].  She quit with Chantix in 2018. 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  I have personally reviewed patient's past medical/family/social history, allergies, current medications.  Past Medical History:  Diagnosis Date  . Breast cancer ([place]) 2016   Left   . COPD (chronic obstructive pulmonary disease) ([place])   . High blood pressure   . High cholesterol      Family History  Problem Relation Age of Onset  . Breast cancer Mother   . Diabetes Brother      Social History   Occupational History  . Not on file  Tobacco Use  . Smoking status: Former Smoker    Packs/day: 2.00    Years: 40.00    Pack years: 80.00    Quit date: 11/29/2016    Years since quitting: 2.0  . Smokeless tobacco: Never Used  Substance and Sexual Activity  . Alcohol use: Not Currently  . Drug use: Never  . Sexual activity: Yes    Partners: Male    Birth control/protection: None    Allergies  Allergen Reactions  . Codeine Itching    Feels like something crawling on her skin   . Tetanus Toxoids Other (See Comments)    Patient stated,"I was given a Pneumonia shot at the same time I got the Tetanus shot, in the same arm."     Outpatient Medications Prior to Visit  Medication Sig Dispense Refill  . albuterol (PROAIR HFA) 108 (90 Base) MCG/ACT inhaler inhale 2 puff by inhalation route  every 4 - 6 hours as needed 18 g 3  . albuterol (PROVENTIL) (2.5 MG/3ML) 0.083% nebulizer solution Take 3 mLs (2.5 mg total) by nebulization every 2 (two) hours as needed for wheezing or shortness of breath. 75 mL 99  . amLODipine (NORVASC) 10 MG tablet Take 1 tablet (10 mg total) by mouth daily. 90 tablet 3  . anastrozole (ARIMIDEX) 1 MG tablet Take 1 tablet (1 mg total) by mouth daily. 90 tablet 3  . aspirin 81 MG chewable tablet Chew by mouth.    Marland Kitchen atorvastatin (LIPITOR) 40 MG tablet TAKE 1 TABLET DAILY (DISCONTINUE PRAVASTATIN) 90 tablet 3  . betamethasone dipropionate (  DIPROLENE) 0.05 % cream Apply topically 2 (two) times daily. To affected area(s) as needed 15 g 1  . budesonide-formoterol (SYMBICORT) 160-4.5 MCG/ACT inhaler Inhale 2 puffs into the lungs 2 (two) times daily. 3 Inhaler 3  . Calcium-Magnesium-Vitamin D (CALCIUM 1200+D3 PO) Take 1 capsule by mouth 2 (two) times daily.    . cyclobenzaprine (FLEXERIL) 10 MG tablet TAKE 1 TABLET THREE TIMES A DAY AS NEEDED FOR MUSCLE SPASMS 30 tablet 1  . losartan (COZAAR) 50 MG tablet Take 1 tablet (50 mg total) by mouth daily. 90 tablet 3  . meloxicam (MOBIC) 15 MG tablet Take 1 tablet (15 mg total) by mouth daily. 90 tablet 3  . metFORMIN (GLUCOPHAGE) 500 MG tablet Take 1 tablet (500 mg total) by mouth daily with  breakfast. 90 tablet 1  . MITIGARE 0.6 MG CAPS TAKE 1 CAPSULE DAILY 90 capsule 3  . montelukast (SINGULAIR) 10 MG tablet Take 1 tablet (10 mg total) by mouth at bedtime. 90 tablet 3  . SPIRIVA RESPIMAT 2.5 MCG/ACT AERS USE 1 INHALATION TWICE A DAY 12 g 0  . traZODone (DESYREL) 50 MG tablet TAKE 1 TABLET BY MOUTH EVERYDAY AT BEDTIME 90 tablet 0   No facility-administered medications prior to visit.     Review of Systems  Constitutional: Negative for chills, diaphoresis, fever, malaise/fatigue and weight loss.  HENT: Negative for congestion, ear pain and sore throat.   Respiratory: Positive for shortness of breath and wheezing. Negative for cough, hemoptysis and sputum production.   Cardiovascular: Negative for chest pain, palpitations and leg swelling.  Gastrointestinal: Positive for heartburn. Negative for abdominal pain and nausea.  Genitourinary: Negative for frequency.  Musculoskeletal: Negative for joint pain and myalgias.  Skin: Negative for itching and rash.  Neurological: Negative for dizziness, weakness and headaches.  Endo/Heme/Allergies: Bruises/bleeds easily.  Psychiatric/Behavioral: Negative for depression. The patient is nervous/anxious.      Objective:   Vitals:   12/22/18 0909  BP: (!) 110/58  Pulse: 77  Temp: (!) 97.1 F (36.2 C)  TempSrc: Temporal  SpO2: 98%  Weight: 131 lb (59.4 kg)  Height: _0  (1.499 m)   SpO2: 98 % O2 Device: Nasal cannula O2 Flow Rate (L/min): 2 L/min O2 Type: Pulse O2  Physical Exam: General: Well-appearing, no acute distress HENT: [place], AT Eyes: EOMI, no scleral icterus Respiratory: Clear to auscultation bilaterally.  No crackles, wheezing or rales Cardiovascular: RRR, -M/R/G, no JVD GI: BS+, soft, nontender Extremities:-Edema,-tenderness Neuro: AAO x4, CNII-XII grossly intact Skin: Intact, no rashes or bruising Psych: Normal mood, normal affect  Data Reviewed:  Imaging: 10/2015 CT chest [facility]  multiple calcified granulomatous largest is 8 mm, 2 noncalcified 3 mm left lower lobe nodules 11/2016 CT chest 6-11m nodules Right upper an lower nodules ([place]    [place] [facility] FEV1 1.03 L 45% predicted, no air trapping, diffusion capacity 35% predicted  Labs: CBC    Component Value Date/Time   WBC 6.0 12/19/2018 0933   WBC 7.5 07/30/2018 0933   RBC 4.58 12/19/2018 0933   HGB 13.1 12/19/2018 0933   HCT 41.0 12/19/2018 0933   PLT 332 12/19/2018 0933   MCV 89.5 12/19/2018 0933   MCH 28.6 12/19/2018 0933   MCHC 32.0 12/19/2018 0933   RDW 12.9 12/19/2018 0933   LYMPHSABS 1.2 12/19/2018 0933   MONOABS 0.5 12/19/2018 0933   EOSABS 0.1 12/19/2018 0933  BASOSABS 0.0 12/19/2018 0933   BMET    Component Value Date/Time   NA 139 12/19/2018 0933   NA 139 10/09/2017   K 4.5 12/19/2018 0933   CL 101 12/19/2018 0933   CL 103 10/09/2017   CO2 30 12/19/2018 0933   CO2 22 10/09/2017   GLUCOSE 206 (H) 12/19/2018 0933   BUN 17 12/19/2018 0933   BUN 17 10/09/2017   CREATININE 0.98 12/19/2018 0933   CREATININE 0.88 07/30/2018 0933   CALCIUM 10.1 12/19/2018 0933   CALCIUM 9.2 10/09/2017   GFRNONAA >60 12/19/2018 0933   GFRNONAA 72 07/30/2018 0933   GFRAA >60 12/19/2018 0933   GFRAA 84 07/30/2018 0933   Imaging, labs and tests noted above have been reviewed independently by me.    Assessment & Plan:   Discussion: 58 year old with severe COPD, chronic hypoxemic respiratory failure, multiple pulmonary nodules and stage I (pT1pN0Mx) IDC of the left breast (ER/PR+, HER2-) without recurrence presents for follow-up  Severe COPD - stable CONTINUE Symbicort 160-4.5 mcg 2 puffs twice daily. REFILL TODAY CONTINUE Spiriva 2.5 mcg 2 puffs daily. REFILL TODAY 90 day prescription requested. Encourage regular aerobic activity.  Chronic hypoxemic respiratory failure - stable Target oxygen  levels >88%. Wear your oxygen if it is <88% and at night.  Multiple lung nodules Due for repeat CT scan however will defer until we are out of viral season  Health Maintenance Immunization History  Administered Date(s) Administered  . Influenza, Seasonal, Injecte, Preservative Fre 10/15/2016  . Influenza,inj,Quad PF,6+ Mos 09/23/2017, 09/16/2018  . Pneumococcal Polysaccharide-23 05/29/2017  . Tdap 05/29/2017   CT Lung Screen - Due. But will discuss at next visit.   No orders of the defined types were placed in this encounter.  Meds ordered this encounter  Medications  . Tiotropium Bromide Monohydrate (SPIRIVA RESPIMAT) 2.5 MCG/ACT AERS    Sig: Inhale 2 puffs into the lungs daily.    Dispense:  4 g    Refill:  0    Order Specific Question:   Lot Number?    Answer:   [id number]    Order Specific Question:   Expiration Date?    Answer:   11/29/2020    Order Specific Question:   Quantity    Answer:   2  . DISCONTD: Tiotropium Bromide Monohydrate (SPIRIVA RESPIMAT) 2.5 MCG/ACT AERS    Sig: Inhale 2 puffs into the lungs daily.    Dispense:  4 g    Refill:  6  . Tiotropium Bromide Monohydrate (SPIRIVA RESPIMAT) 2.5 MCG/ACT AERS    Sig: Inhale 2 puffs into the lungs daily.    Dispense:  12 g    Refill:  3    90 day supply    Return in about 4 months (around 04/21/2019).  [place], MD [facility] 12/22/2018 9:12 AM  Office Number [phone]

## 2018-12-22 NOTE — Patient Instructions (Addendum)
Severe COPD Chronic hypoxemic respiratory failure CONTINUE Symbicort 160-4.5 mcg 2 puffs twice daily. REFILL TODAY CONTINUE Spiriva 2.5 mcg 2 puffs daily. REFILL TODAY 90 day prescription requested. Encourage regular aerobic activity. Target oxygen levels >88%. Wear your oxygen if it is <88% and at night.  Multiple lung nodules Due for repeat CT scan however will defer until we are out of viral season  Follow-up with me in 4 months

## 2018-12-27 DIAGNOSIS — J449 Chronic obstructive pulmonary disease, unspecified: Secondary | ICD-10-CM | POA: Diagnosis not present

## 2018-12-29 DIAGNOSIS — M2011 Hallux valgus (acquired), right foot: Secondary | ICD-10-CM | POA: Diagnosis not present

## 2018-12-29 DIAGNOSIS — B070 Plantar wart: Secondary | ICD-10-CM | POA: Diagnosis not present

## 2018-12-29 DIAGNOSIS — M2012 Hallux valgus (acquired), left foot: Secondary | ICD-10-CM | POA: Diagnosis not present

## 2018-12-29 DIAGNOSIS — E119 Type 2 diabetes mellitus without complications: Secondary | ICD-10-CM | POA: Diagnosis not present

## 2019-01-20 ENCOUNTER — Telehealth: Payer: Self-pay

## 2019-01-20 NOTE — Telephone Encounter (Signed)
Pt called with complaints of symptoms of sinus infection. As per pt, she is having severe congestion. Denies fever, cough or body aches. Requesting if a rx for antibiotic can be sent to the pharmacy. Called pt, aware that an evaluation for symptoms is needed before rx can be issued. Agreeable with plan. Pt transferred to [facility] for scheduling of virtual acute appt. No other inquiries during call.

## 2019-01-26 ENCOUNTER — Ambulatory Visit (INDEPENDENT_AMBULATORY_CARE_PROVIDER_SITE_OTHER): Payer: BC Managed Care – PPO | Admitting: Osteopathic Medicine

## 2019-01-26 ENCOUNTER — Encounter: Payer: Self-pay | Admitting: Osteopathic Medicine

## 2019-01-26 VITALS — Temp 97.8°F | Wt 130.0 lb

## 2019-01-26 DIAGNOSIS — J449 Chronic obstructive pulmonary disease, unspecified: Secondary | ICD-10-CM | POA: Diagnosis not present

## 2019-01-26 DIAGNOSIS — J988 Other specified respiratory disorders: Secondary | ICD-10-CM | POA: Diagnosis not present

## 2019-01-26 DIAGNOSIS — J309 Allergic rhinitis, unspecified: Secondary | ICD-10-CM

## 2019-01-26 DIAGNOSIS — B9789 Other viral agents as the cause of diseases classified elsewhere: Secondary | ICD-10-CM

## 2019-01-26 MED ORDER — IPRATROPIUM BROMIDE 0.06 % NA SOLN: 2.0000 | Freq: Four times a day (QID) | NASAL | 1 refills | Status: DC

## 2019-01-26 MED ORDER — PREDNISONE 20 MG PO TABS: 20.0000 mg | ORAL_TABLET | Freq: Two times a day (BID) | ORAL | 0 refills | Status: DC

## 2019-01-26 MED ORDER — LORATADINE 10 MG PO TABS: 10.0000 mg | ORAL_TABLET | Freq: Every day | ORAL | 1 refills | Status: DC

## 2019-01-26 NOTE — Progress Notes (Signed)
Called pt at 145 pm, phone rings then busy tone. Unable to leave a vm msg.

## 2019-01-26 NOTE — Progress Notes (Signed)
Virtual Visit via Video (App used: Doximity) Note  I connected with      Sherri Gibson on 01/26/19 at 2:34 PM  by a telemedicine application and verified that I am speaking with the correct person using two identifiers.  Patient is at home I am in office   I discussed the limitations of evaluation and management by telemedicine and the availability of in person appointments. The patient expressed understanding and agreed to proceed.  History of Present Illness: Sherri Gibson is a 58 y.o. female who would like to discuss feeling ill.    Starting to get better but still having issues Fine and breathing normally, then will suddenly feels like "head is stopped up" On O2 qhs chronically Mucinex, Tylenol, no fever/chills, O2 levels are good Bought nasal rinse, this has helped too  No SOB  About 10 days ago, went to see her niece who has young twins who were mildly ill with respiratory symptoms, they were doing ok on benadryl but as soon as this was stopped the kids got sick again.       Observations/Objective: Temp 97.8 F (36.6 C) (Oral)   Wt 130 lb (59 kg)   SpO2 97%   BMI 26.26 kg/m  BP Readings from Last 3 Encounters:  12/22/18 (!) 110/58  12/19/18 116/63  10/09/18 115/71   Exam: Normal Speech.  NAD    Lab and Radiology Results No results found for this or any previous visit (from the past 72 hour(s)). No results found.       Assessment and Plan: 58 y.o. female with The primary encounter diagnosis was Viral respiratory illness. A diagnosis of Allergic sinusitis was also pertinent to this visit.   Sounds like sick contacts from kiddos, not strongly suspicious for COVID but anything is possible! Advised isolation, masks AT ALL TIMES around family members and frequent handwashing, appropriate distancing.    PDMP not reviewed this encounter. No orders of the defined types were placed in this encounter.  Meds ordered this encounter   Medications  . ipratropium (ATROVENT) 0.06 % nasal spray    Sig: Place 2 sprays into both nostrils 4 (four) times daily.    Dispense:  15 mL    Refill:  1  . predniSONE (DELTASONE) 20 MG tablet    Sig: Take 1 tablet (20 mg total) by mouth 2 (two) times daily with a meal.    Dispense:  10 tablet    Refill:  0  . loratadine (CLARITIN) 10 MG tablet    Sig: Take 1 tablet (10 mg total) by mouth daily.    Dispense:  90 tablet    Refill:  1     Follow Up Instructions: Return if symptoms worsen or fail to improve.    I discussed the assessment and treatment plan with the patient. The patient was provided an opportunity to ask questions and all were answered. The patient agreed with the plan and demonstrated an understanding of the instructions.   The patient was advised to call back or seek an in-person evaluation if any new concerns, if symptoms worsen or if the condition fails to improve as anticipated.  25 minutes of non-face-to-face time was provided during this encounter.      . . . . . . . . . . . . . Marland Kitchen                   Historical information moved to improve visibility of documentation.  Past Medical History:  Diagnosis Date  . Breast cancer ([place]) 2016   Left   . COPD (chronic obstructive pulmonary disease) ([place])   . High blood pressure   . High cholesterol    Past Surgical History:  Procedure Laterality Date  . BREAST BIOPSY    . BREAST LUMPECTOMY Left   . BREAST LUMPECTOMY WITH AXILLARY LYMPH NODE BIOPSY  2016  . PARTIAL HYSTERECTOMY     Social History   Tobacco Use  . Smoking status: Former Smoker    Packs/day: 2.00    Years: 40.00    Pack years: 80.00    Quit date: 11/29/2016    Years since quitting: 2.1  . Smokeless tobacco: Never Used  Substance Use Topics  . Alcohol use: Not Currently   family history includes Breast cancer in her mother; Diabetes in her brother.  Medications: Current Outpatient Medications   Medication Sig Dispense Refill  . albuterol (PROAIR HFA) 108 (90 Base) MCG/ACT inhaler inhale 2 puff by inhalation route  every 4 - 6 hours as needed 18 g 3  . albuterol (PROVENTIL) (2.5 MG/3ML) 0.083% nebulizer solution Take 3 mLs (2.5 mg total) by nebulization every 2 (two) hours as needed for wheezing or shortness of breath. 75 mL 99  . amLODipine (NORVASC) 10 MG tablet Take 1 tablet (10 mg total) by mouth daily. 90 tablet 3  . anastrozole (ARIMIDEX) 1 MG tablet Take 1 tablet (1 mg total) by mouth daily. 90 tablet 3  . aspirin 81 MG chewable tablet Chew by mouth.    Marland Kitchen atorvastatin (LIPITOR) 40 MG tablet TAKE 1 TABLET DAILY (DISCONTINUE PRAVASTATIN) 90 tablet 3  . betamethasone dipropionate (DIPROLENE) 0.05 % cream Apply topically 2 (two) times daily. To affected area(s) as needed 15 g 1  . budesonide-formoterol (SYMBICORT) 160-4.5 MCG/ACT inhaler Inhale 2 puffs into the lungs 2 (two) times daily. 3 Inhaler 3  . Calcium-Magnesium-Vitamin D (CALCIUM 1200+D3 PO) Take 1 capsule by mouth 2 (two) times daily.    . cyclobenzaprine (FLEXERIL) 10 MG tablet TAKE 1 TABLET THREE TIMES A DAY AS NEEDED FOR MUSCLE SPASMS 30 tablet 1  . losartan (COZAAR) 50 MG tablet Take 1 tablet (50 mg total) by mouth daily. 90 tablet 3  . meloxicam (MOBIC) 15 MG tablet Take 1 tablet (15 mg total) by mouth daily. 90 tablet 3  . metFORMIN (GLUCOPHAGE) 500 MG tablet Take 1 tablet (500 mg total) by mouth daily with breakfast. 90 tablet 1  . MITIGARE 0.6 MG CAPS TAKE 1 CAPSULE DAILY 90 capsule 3  . montelukast (SINGULAIR) 10 MG tablet Take 1 tablet (10 mg total) by mouth at bedtime. 90 tablet 3  . Salicylic Acid (OCCLUSAL-HP EX) Apply topically.    Marland Kitchen SPIRIVA RESPIMAT 2.5 MCG/ACT AERS USE 1 INHALATION TWICE A DAY 12 g 0  . Tiotropium Bromide Monohydrate (SPIRIVA RESPIMAT) 2.5 MCG/ACT AERS Inhale 2 puffs into the lungs daily. 4 g 0  . Tiotropium Bromide Monohydrate (SPIRIVA RESPIMAT) 2.5 MCG/ACT AERS Inhale 2 puffs into the  lungs daily. 12 g 3  . traZODone (DESYREL) 50 MG tablet TAKE 1 TABLET BY MOUTH EVERYDAY AT BEDTIME 90 tablet 0   No current facility-administered medications for this visit.   Allergies  Allergen Reactions  . Codeine Itching    Feels like something crawling on her skin   . Tetanus Toxoids Other (See Comments)    Patient stated,"I was given a Pneumonia shot at the same time I got the Tetanus shot, in the  same arm."

## 2019-01-30 DIAGNOSIS — J449 Chronic obstructive pulmonary disease, unspecified: Secondary | ICD-10-CM | POA: Diagnosis not present

## 2019-01-30 HISTORY — PX: BREAST LUMPECTOMY: SHX2

## 2019-02-02 ENCOUNTER — Encounter: Payer: BC Managed Care – PPO | Admitting: Osteopathic Medicine

## 2019-02-02 DIAGNOSIS — M2012 Hallux valgus (acquired), left foot: Secondary | ICD-10-CM | POA: Diagnosis not present

## 2019-02-02 DIAGNOSIS — B070 Plantar wart: Secondary | ICD-10-CM | POA: Diagnosis not present

## 2019-02-02 DIAGNOSIS — M2011 Hallux valgus (acquired), right foot: Secondary | ICD-10-CM | POA: Diagnosis not present

## 2019-02-02 DIAGNOSIS — E119 Type 2 diabetes mellitus without complications: Secondary | ICD-10-CM | POA: Diagnosis not present

## 2019-02-06 ENCOUNTER — Telehealth: Payer: Self-pay | Admitting: Gastroenterology

## 2019-02-06 NOTE — Telephone Encounter (Signed)
ROI faxed to [facility] 1/8/21fbg

## 2019-02-09 ENCOUNTER — Other Ambulatory Visit: Payer: Self-pay

## 2019-02-09 MED ORDER — CYCLOBENZAPRINE HCL 10 MG PO TABS: ORAL_TABLET | ORAL | 1 refills | Status: DC

## 2019-02-09 MED ORDER — TRAZODONE HCL 50 MG PO TABS: ORAL_TABLET | ORAL | 1 refills | Status: DC

## 2019-02-26 DIAGNOSIS — J449 Chronic obstructive pulmonary disease, unspecified: Secondary | ICD-10-CM | POA: Diagnosis not present

## 2019-03-05 DIAGNOSIS — B070 Plantar wart: Secondary | ICD-10-CM | POA: Diagnosis not present

## 2019-03-05 DIAGNOSIS — E119 Type 2 diabetes mellitus without complications: Secondary | ICD-10-CM | POA: Diagnosis not present

## 2019-03-06 ENCOUNTER — Other Ambulatory Visit: Payer: Self-pay

## 2019-03-06 ENCOUNTER — Ambulatory Visit: Payer: BC Managed Care – PPO | Admitting: Sports Medicine

## 2019-03-06 ENCOUNTER — Ambulatory Visit (INDEPENDENT_AMBULATORY_CARE_PROVIDER_SITE_OTHER): Payer: BC Managed Care – PPO

## 2019-03-06 DIAGNOSIS — M25511 Pain in right shoulder: Secondary | ICD-10-CM | POA: Diagnosis not present

## 2019-03-06 DIAGNOSIS — M19011 Primary osteoarthritis, right shoulder: Secondary | ICD-10-CM | POA: Diagnosis not present

## 2019-03-06 DIAGNOSIS — M19012 Primary osteoarthritis, left shoulder: Secondary | ICD-10-CM | POA: Insufficient documentation

## 2019-03-06 NOTE — Progress Notes (Signed)
    Procedures performed today:    Procedure: Real-time Ultrasound Guided injection of the right glenohumeral joint Device: Samsung [id number]  Verbal informed consent obtained.  Time-out conducted.  Noted no overlying erythema, induration, or other signs of local infection.  Skin prepped in a sterile fashion.  Local anesthesia: Topical Ethyl chloride.  With sterile technique and under real time ultrasound guidance: 1 cc Kenalog 40, 2 cc lidocaine, 2 cc bupivacaine injected easily Completed without difficulty  Pain immediately resolved suggesting accurate placement of the medication.  Advised to call if fevers/chills, erythema, induration, drainage, or persistent bleeding.  Images permanently stored and available for review in the ultrasound unit.  Impression: Technically successful ultrasound guided injection.  Independent interpretation of tests performed by another provider:   None.  Impression and Recommendations:    Primary osteoarthritis, right shoulder Sherri Gibson has had a long history of right shoulder pain, localized to the joint line and worse with abduction and external rotation. She has tried meloxicam, acetaminophen without much improvement. I did review x-rays from [facility] from 2019 that showed glenohumeral osteoarthritis, it has likely worsened. We performed a glenohumeral injection with ultrasound guidance today, home rehab exercises given. Return to see me in 1 month.    ___________________________________________ Gwen Her. Dianah Field, M.D., ABFM., CAQSM. Primary Care and [place] Instructor of [employer] [facility]

## 2019-03-06 NOTE — Assessment & Plan Note (Signed)
Sherri Gibson has had a long history of right shoulder pain, localized to the joint line and worse with abduction and external rotation. She has tried meloxicam, acetaminophen without much improvement. I did review x-rays from [facility] from 2019 that showed glenohumeral osteoarthritis, it has likely worsened. We performed a glenohumeral injection with ultrasound guidance today, home rehab exercises given. Return to see me in 1 month.

## 2019-03-09 ENCOUNTER — Telehealth: Payer: Self-pay | Admitting: Osteopathic Medicine

## 2019-03-09 ENCOUNTER — Telehealth: Payer: Self-pay | Admitting: *Deleted

## 2019-03-09 ENCOUNTER — Other Ambulatory Visit: Payer: Self-pay

## 2019-03-09 ENCOUNTER — Ambulatory Visit: Payer: BC Managed Care – PPO | Admitting: Nurse Practitioner

## 2019-03-09 ENCOUNTER — Encounter: Payer: Self-pay | Admitting: Nurse Practitioner

## 2019-03-09 VITALS — BP 130/86 | HR 86 | Temp 98.4°F | Ht 59.0 in | Wt 133.1 lb

## 2019-03-09 DIAGNOSIS — R7303 Prediabetes: Secondary | ICD-10-CM | POA: Diagnosis not present

## 2019-03-09 DIAGNOSIS — M19011 Primary osteoarthritis, right shoulder: Secondary | ICD-10-CM

## 2019-03-09 LAB — POCT GLYCOSYLATED HEMOGLOBIN (HGB A1C): Hemoglobin A1C: 6.3 % — AB (ref 4.0–5.6)

## 2019-03-09 MED ORDER — CELECOXIB 200 MG PO CAPS: ORAL_CAPSULE | ORAL | 2 refills | Status: DC

## 2019-03-09 NOTE — Telephone Encounter (Signed)
Pt left vm this morning stating that she thought you told her at her visit last Friday that you were going to send over Celebrex for her but the pharmacy never got anything.  There's nothing in the appointment note stating this.  Please advise.

## 2019-03-09 NOTE — Telephone Encounter (Signed)
LMOM notifying pt of new rx and to stop the meloxicam on her med list.

## 2019-03-09 NOTE — Telephone Encounter (Signed)
Patient stated that the medication listed below needed a [facility]. Medication listed below. AM    celecoxib (CELEBREX) 200 MG capsule [id number]    [facility] [place], [place] Phone:  [phone]  Fax:  [phone]

## 2019-03-09 NOTE — Progress Notes (Signed)
Acute Office Visit  Subjective:    Patient ID: Sherri Gibson, female    DOB: February 21, 1960, 59 y.o.   MRN: [id number]  Chief Complaint  Patient presents with  . Diabetes    HPI Patient is in today for elevated blood glucose levels at home. Patient reports on Friday night after dinner she decided to check her blood glucose while checking her husbands. Her BG was 225 at that time. She reports drinking 2 bottles of water and rechecking- her BG increased to 249. She reports continuing to monitor her BG through the weekend with BG 198 before breakfast Sunday and 168 before dinner. She reports this morning after breakfast her BG was 128. He reports taking her metformin daily as prescribed without any issues.   She denies polyuria, polyphasia, polydipsia.  She acknowledges intermittent blurred vision and occasional flushing, which she attributes to age.      Past Medical History:  Diagnosis Date  . Breast cancer ([place]) 2016   Left   . COPD (chronic obstructive pulmonary disease) ([place])   . High blood pressure   . High cholesterol     Past Surgical History:  Procedure Laterality Date  . BREAST BIOPSY    . BREAST LUMPECTOMY Left   . BREAST LUMPECTOMY WITH AXILLARY LYMPH NODE BIOPSY  2016  . PARTIAL HYSTERECTOMY      Family History  Problem Relation Age of Onset  . Breast cancer Mother   . Diabetes Brother     Social History   Socioeconomic History  . Marital status: Married    Spouse name: Not on file  . Number of children: Not on file  . Years of education: Not on file  . Highest education level: Not on file  Occupational History  . Not on file  Tobacco Use  . Smoking status: Former Smoker    Packs/day: 2.00    Years: 40.00    Pack years: 80.00    Quit date: 11/29/2016    Years since quitting: 2.2  . Smokeless tobacco: Never Used  Substance and Sexual Activity  . Alcohol use: Not Currently  . Drug use: Never  . Sexual activity: Yes    Partners: Male    Birth  control/protection: None  Other Topics Concern  . Not on file  Social History Narrative  . Not on file   Social Determinants of Health   Financial Resource Strain:   . Difficulty of Paying Living Expenses: Not on file  Food Insecurity:   . Worried About Charity fundraiser in the Last Year: Not on file  . Ran Out of Food in the Last Year: Not on file  Transportation Needs:   . Lack of Transportation (Medical): Not on file  . Lack of Transportation (Non-Medical): Not on file  Physical Activity:   . Days of Exercise per Week: Not on file  . Minutes of Exercise per Session: Not on file  Stress:   . Feeling of Stress : Not on file  Social Connections:   . Frequency of Communication with Friends and Family: Not on file  . Frequency of Social Gatherings with Friends and Family: Not on file  . Attends Religious Services: Not on file  . Active Member of Clubs or Organizations: Not on file  . Attends Archivist Meetings: Not on file  . Marital Status: Not on file  Intimate Partner Violence:   . Fear of Current or Ex-Partner: Not on file  . Emotionally Abused: Not on  file  . Physically Abused: Not on file  . Sexually Abused: Not on file    Outpatient Medications Prior to Visit  Medication Sig Dispense Refill  . albuterol (PROAIR HFA) 108 (90 Base) MCG/ACT inhaler inhale 2 puff by inhalation route  every 4 - 6 hours as needed 18 g 3  . albuterol (PROVENTIL) (2.5 MG/3ML) 0.083% nebulizer solution Take 3 mLs (2.5 mg total) by nebulization every 2 (two) hours as needed for wheezing or shortness of breath. 75 mL 99  . amLODipine (NORVASC) 10 MG tablet Take 1 tablet (10 mg total) by mouth daily. 90 tablet 3  . anastrozole (ARIMIDEX) 1 MG tablet Take 1 tablet (1 mg total) by mouth daily. 90 tablet 3  . aspirin 81 MG chewable tablet Chew by mouth.    Marland Kitchen atorvastatin (LIPITOR) 40 MG tablet TAKE 1 TABLET DAILY (DISCONTINUE PRAVASTATIN) 90 tablet 3  . budesonide-formoterol (SYMBICORT)  160-4.5 MCG/ACT inhaler Inhale 2 puffs into the lungs 2 (two) times daily. 3 Inhaler 3  . Calcium-Magnesium-Vitamin D (CALCIUM 1200+D3 PO) Take 1 capsule by mouth 2 (two) times daily.    . celecoxib (CELEBREX) 200 MG capsule One to 2 tablets by mouth daily as needed for pain. 60 capsule 2  . cyclobenzaprine (FLEXERIL) 10 MG tablet TAKE 1 TABLET THREE TIMES A DAY AS NEEDED FOR MUSCLE SPASMS 90 tablet 1  . loratadine (CLARITIN) 10 MG tablet Take 1 tablet (10 mg total) by mouth daily. 90 tablet 1  . losartan (COZAAR) 50 MG tablet Take 1 tablet (50 mg total) by mouth daily. 90 tablet 3  . metFORMIN (GLUCOPHAGE) 500 MG tablet Take 1 tablet (500 mg total) by mouth daily with breakfast. 90 tablet 1  . MITIGARE 0.6 MG CAPS TAKE 1 CAPSULE DAILY 90 capsule 3  . montelukast (SINGULAIR) 10 MG tablet Take 1 tablet (10 mg total) by mouth at bedtime. 90 tablet 3  . Salicylic Acid (OCCLUSAL-HP EX) Apply topically.    . Tiotropium Bromide Monohydrate (SPIRIVA RESPIMAT) 2.5 MCG/ACT AERS Inhale 2 puffs into the lungs daily. 12 g 3  . traZODone (DESYREL) 50 MG tablet TAKE 1 TABLET BY MOUTH EVERYDAY AT BEDTIME 90 tablet 1  . betamethasone dipropionate (DIPROLENE) 0.05 % cream Apply topically 2 (two) times daily. To affected area(s) as needed (Patient not taking: Reported on 03/09/2019) 15 g 1  . ipratropium (ATROVENT) 0.06 % nasal spray Place 2 sprays into both nostrils 4 (four) times daily. (Patient not taking: Reported on 03/09/2019) 15 mL 1   No facility-administered medications prior to visit.    Allergies  Allergen Reactions  . Codeine Itching    Feels like something crawling on her skin   . Tetanus Toxoids Other (See Comments)    Patient stated,"I was given a Pneumonia shot at the same time I got the Tetanus shot, in the same arm."    Review of Systems  Constitutional: Negative for chills, fatigue and unexpected weight change.  Eyes: Negative for visual disturbance.  Gastrointestinal: Negative for  diarrhea, nausea and vomiting.  Endocrine: Negative for polydipsia, polyphagia and polyuria.  Genitourinary: Negative for frequency.  Neurological: Negative for dizziness.       Objective:    Physical Exam Vitals and nursing note reviewed.  Constitutional:      Appearance: Normal appearance.  Cardiovascular:     Rate and Rhythm: Normal rate and regular rhythm.     Pulses: Normal pulses.     Heart sounds: Normal heart sounds.  Pulmonary:  Effort: Pulmonary effort is normal.     Breath sounds: Normal breath sounds.  Abdominal:     General: Abdomen is flat. Bowel sounds are normal.     Palpations: Abdomen is soft.  Skin:    General: Skin is warm and dry.     Capillary Refill: Capillary refill takes less than 2 seconds.  Neurological:     General: No focal deficit present.     Mental Status: She is alert and oriented to person, place, and time.  Psychiatric:        Mood and Affect: Mood normal.        Behavior: Behavior normal.     BP 130/86   Pulse 86   Temp 98.4 F (36.9 C) (Oral)   Ht 4\' 11"  (1.499 m)   Wt 133 lb 1.9 oz (60.4 kg)   SpO2 95%   BMI 26.89 kg/m  Wt Readings from Last 3 Encounters:  03/09/19 133 lb 1.9 oz (60.4 kg)  01/26/19 130 lb (59 kg)  12/22/18 131 lb (59.4 kg)    Health Maintenance Due  Topic Date Due  . Hepatitis C Screening  May 09, 1960  . FOOT EXAM  06/17/1970  . OPHTHALMOLOGY EXAM  06/17/1970  . HIV Screening  06/17/1975  . PAP SMEAR-Modifier  06/16/1981  . HEMOGLOBIN A1C  01/30/2019    There are no preventive care reminders to display for this patient.   No results found for: TSH Lab Results  Component Value Date   WBC 6.0 12/19/2018   HGB 13.1 12/19/2018   HCT 41.0 12/19/2018   MCV 89.5 12/19/2018   PLT 332 12/19/2018   Lab Results  Component Value Date   NA 139 12/19/2018   K 4.5 12/19/2018   CO2 30 12/19/2018   GLUCOSE 206 (H) 12/19/2018   BUN 17 12/19/2018   CREATININE 0.98 12/19/2018   BILITOT 0.5 12/19/2018    ALKPHOS 63 12/19/2018   AST 21 12/19/2018   ALT 23 12/19/2018   PROT 7.6 12/19/2018   ALBUMIN 4.8 12/19/2018   CALCIUM 10.1 12/19/2018   ANIONGAP 8 12/19/2018   Lab Results  Component Value Date   CHOL 162 07/30/2018   Lab Results  Component Value Date   HDL 56 07/30/2018   Lab Results  Component Value Date   LDLCALC 86 07/30/2018   Lab Results  Component Value Date   TRIG 108 07/30/2018   Lab Results  Component Value Date   CHOLHDL 2.9 07/30/2018   Lab Results  Component Value Date   HGBA1C 6.0 (A) 07/30/2018       Assessment & Plan:   1. Prediabetes Hemoglobin A1c measurement today, which was 6.3%. The patient did have an interarticular steroid injection on Friday, which is highly likely to have adversely increased her blood glucose levels through the weekend.  It does appear that her blood glucose levels are returning to normal based on this morning's reading. Discussed with the patient the adverse effects of steroid injections on blood glucose levels for appx 7-10 days after injection.  Discussed with the patient the importance of maintaining low carbohydrate diet and regular cardiovascular exercise to help control her blood glucose. No changes to the plan of care today.  Patient does have a CPE with Dr. Sheppard Coil later this month.   Orma Render, NP

## 2019-03-09 NOTE — Telephone Encounter (Signed)
Approved today (Celebrex) CaseId:[id number];Status:Approved;Review Type:Prior Auth;Coverage Start Date:02/07/2019;Coverage End Date:03/08/2020; Pharmacy aware.

## 2019-03-09 NOTE — Telephone Encounter (Signed)
Left brief message for patient as well that it was approved.

## 2019-03-09 NOTE — Patient Instructions (Signed)

## 2019-03-09 NOTE — Telephone Encounter (Signed)
No problem at all, sending in now.  Discontinue all other NSAIDs

## 2019-03-13 ENCOUNTER — Encounter: Payer: Self-pay | Admitting: Gastroenterology

## 2019-03-21 DIAGNOSIS — E785 Hyperlipidemia, unspecified: Secondary | ICD-10-CM | POA: Diagnosis not present

## 2019-03-21 DIAGNOSIS — R52 Pain, unspecified: Secondary | ICD-10-CM | POA: Diagnosis not present

## 2019-03-21 DIAGNOSIS — S161XXA Strain of muscle, fascia and tendon at neck level, initial encounter: Secondary | ICD-10-CM | POA: Diagnosis not present

## 2019-03-21 DIAGNOSIS — M542 Cervicalgia: Secondary | ICD-10-CM | POA: Diagnosis not present

## 2019-03-21 DIAGNOSIS — Z79899 Other long term (current) drug therapy: Secondary | ICD-10-CM | POA: Diagnosis not present

## 2019-03-21 DIAGNOSIS — Z853 Personal history of malignant neoplasm of breast: Secondary | ICD-10-CM | POA: Diagnosis not present

## 2019-03-21 DIAGNOSIS — Z043 Encounter for examination and observation following other accident: Secondary | ICD-10-CM | POA: Diagnosis not present

## 2019-03-21 DIAGNOSIS — S0101XA Laceration without foreign body of scalp, initial encounter: Secondary | ICD-10-CM | POA: Diagnosis not present

## 2019-03-21 DIAGNOSIS — Z87891 Personal history of nicotine dependence: Secondary | ICD-10-CM | POA: Diagnosis not present

## 2019-03-21 DIAGNOSIS — W010XXA Fall on same level from slipping, tripping and stumbling without subsequent striking against object, initial encounter: Secondary | ICD-10-CM | POA: Diagnosis not present

## 2019-03-21 DIAGNOSIS — Z885 Allergy status to narcotic agent status: Secondary | ICD-10-CM | POA: Diagnosis not present

## 2019-03-21 DIAGNOSIS — W19XXXA Unspecified fall, initial encounter: Secondary | ICD-10-CM | POA: Diagnosis not present

## 2019-03-21 DIAGNOSIS — I10 Essential (primary) hypertension: Secondary | ICD-10-CM | POA: Diagnosis not present

## 2019-03-21 DIAGNOSIS — Z7982 Long term (current) use of aspirin: Secondary | ICD-10-CM | POA: Diagnosis not present

## 2019-03-21 DIAGNOSIS — R58 Hemorrhage, not elsewhere classified: Secondary | ICD-10-CM | POA: Diagnosis not present

## 2019-03-21 DIAGNOSIS — J449 Chronic obstructive pulmonary disease, unspecified: Secondary | ICD-10-CM | POA: Diagnosis not present

## 2019-03-21 DIAGNOSIS — M25519 Pain in unspecified shoulder: Secondary | ICD-10-CM | POA: Diagnosis not present

## 2019-03-21 DIAGNOSIS — S5002XA Contusion of left elbow, initial encounter: Secondary | ICD-10-CM | POA: Diagnosis not present

## 2019-03-21 DIAGNOSIS — S0990XA Unspecified injury of head, initial encounter: Secondary | ICD-10-CM | POA: Diagnosis not present

## 2019-03-25 ENCOUNTER — Encounter: Payer: Self-pay | Admitting: Osteopathic Medicine

## 2019-03-25 ENCOUNTER — Telehealth: Payer: Self-pay | Admitting: Osteopathic Medicine

## 2019-03-25 ENCOUNTER — Ambulatory Visit (INDEPENDENT_AMBULATORY_CARE_PROVIDER_SITE_OTHER): Payer: BC Managed Care – PPO | Admitting: Osteopathic Medicine

## 2019-03-25 ENCOUNTER — Other Ambulatory Visit: Payer: Self-pay | Admitting: Sports Medicine

## 2019-03-25 VITALS — BP 134/76 | HR 94 | Temp 97.5°F | Ht 59.0 in | Wt 133.0 lb

## 2019-03-25 DIAGNOSIS — M8580 Other specified disorders of bone density and structure, unspecified site: Secondary | ICD-10-CM

## 2019-03-25 DIAGNOSIS — J449 Chronic obstructive pulmonary disease, unspecified: Secondary | ICD-10-CM

## 2019-03-25 DIAGNOSIS — R7303 Prediabetes: Secondary | ICD-10-CM | POA: Diagnosis not present

## 2019-03-25 DIAGNOSIS — M19011 Primary osteoarthritis, right shoulder: Secondary | ICD-10-CM

## 2019-03-25 DIAGNOSIS — I10 Essential (primary) hypertension: Secondary | ICD-10-CM

## 2019-03-25 DIAGNOSIS — Z0000 Encounter for general adult medical examination without abnormal findings: Secondary | ICD-10-CM

## 2019-03-25 DIAGNOSIS — R911 Solitary pulmonary nodule: Secondary | ICD-10-CM

## 2019-03-25 DIAGNOSIS — Z87891 Personal history of nicotine dependence: Secondary | ICD-10-CM

## 2019-03-25 MED ORDER — CELECOXIB 200 MG PO CAPS: ORAL_CAPSULE | ORAL | 3 refills | Status: DC

## 2019-03-25 NOTE — Progress Notes (Signed)
HPI: Sherri Gibson is a 59 y.o. female who  has a past medical history of Breast cancer ([place]) (2016), COPD (chronic obstructive pulmonary disease) ([place]), High blood pressure, and High cholesterol.  she presents to [facility] today, 03/25/19,  for chief complaint of: Annual physical     Patient here for annual physical / wellness exam.  See preventive care reviewed as below.   Additional concerns today include:    Past medical, surgical, social and family history reviewed:  Patient Active Problem List   Diagnosis Date Noted  . Primary osteoarthritis, right shoulder 03/06/2019  . Chronic hypoxemic respiratory failure ([place]) 12/22/2018  . Osteopenia 12/20/2017  . Prediabetes 09/23/2017  . Colonoscopy refused 05/29/2017  . COPD, severe ([place]) 05/29/2017  . Multiple pulmonary nodules 05/29/2017  . Trigger thumb, right thumb 05/29/2017  . Primary osteoarthritis of first carpometacarpal joint of left hand 05/29/2017  . Kidney lesion 12/14/2016  . Erythrocytosis 11/13/2016  . Breast cancer ([place]) 09/20/2016  . Essential hypertension 09/20/2016  . Insomnia 09/20/2016  . Mixed hyperlipidemia 09/20/2016    Past Surgical History:  Procedure Laterality Date  . BREAST BIOPSY    . BREAST LUMPECTOMY Left   . BREAST LUMPECTOMY WITH AXILLARY LYMPH NODE BIOPSY  2016  . PARTIAL HYSTERECTOMY      Social History   Tobacco Use  . Smoking status: Former Smoker    Packs/day: 2.00    Years: 40.00    Pack years: 80.00    Quit date: 11/29/2016    Years since quitting: 2.3  . Smokeless tobacco: Never Used  Substance Use Topics  . Alcohol use: Not Currently    Family History  Problem Relation Age of Onset  . Breast cancer Mother   . Diabetes Brother      Current medication list and allergy/intolerance information reviewed:    Current Outpatient Medications  Medication Sig Dispense Refill  . albuterol (PROAIR HFA) 108 (90 Base) MCG/ACT  inhaler inhale 2 puff by inhalation route  every 4 - 6 hours as needed 18 g 3  . albuterol (PROVENTIL) (2.5 MG/3ML) 0.083% nebulizer solution Take 3 mLs (2.5 mg total) by nebulization every 2 (two) hours as needed for wheezing or shortness of breath. 75 mL 99  . amLODipine (NORVASC) 10 MG tablet Take 1 tablet (10 mg total) by mouth daily. 90 tablet 3  . aspirin 81 MG chewable tablet Chew by mouth.    Marland Kitchen atorvastatin (LIPITOR) 40 MG tablet TAKE 1 TABLET DAILY (DISCONTINUE PRAVASTATIN) 90 tablet 3  . betamethasone dipropionate (DIPROLENE) 0.05 % cream Apply topically 2 (two) times daily. To affected area(s) as needed 15 g 1  . budesonide-formoterol (SYMBICORT) 160-4.5 MCG/ACT inhaler Inhale 2 puffs into the lungs 2 (two) times daily. 3 Inhaler 3  . Calcium-Magnesium-Vitamin D (CALCIUM 1200+D3 PO) Take 1 capsule by mouth 2 (two) times daily.    . celecoxib (CELEBREX) 200 MG capsule One to 2 tablets by mouth daily as needed for pain. 60 capsule 2  . cyclobenzaprine (FLEXERIL) 10 MG tablet TAKE 1 TABLET THREE TIMES A DAY AS NEEDED FOR MUSCLE SPASMS 90 tablet 1  . ipratropium (ATROVENT) 0.06 % nasal spray Place 2 sprays into both nostrils 4 (four) times daily. 15 mL 1  . loratadine (CLARITIN) 10 MG tablet Take 1 tablet (10 mg total) by mouth daily. 90 tablet 1  . losartan (COZAAR) 50 MG tablet Take 1 tablet (50 mg total) by mouth daily. 90 tablet 3  .  metFORMIN (GLUCOPHAGE) 500 MG tablet Take 1 tablet (500 mg total) by mouth daily with breakfast. 90 tablet 1  . MITIGARE 0.6 MG CAPS TAKE 1 CAPSULE DAILY 90 capsule 3  . montelukast (SINGULAIR) 10 MG tablet Take 1 tablet (10 mg total) by mouth at bedtime. 90 tablet 3  . Salicylic Acid (OCCLUSAL-HP EX) Apply topically.    . Tiotropium Bromide Monohydrate (SPIRIVA RESPIMAT) 2.5 MCG/ACT AERS Inhale 2 puffs into the lungs daily. 12 g 3  . traZODone (DESYREL) 50 MG tablet TAKE 1 TABLET BY MOUTH EVERYDAY AT BEDTIME 90 tablet 1   No current facility-administered  medications for this visit.    Allergies  Allergen Reactions  . Codeine Itching    Feels like something crawling on her skin   . Tetanus Toxoids Other (See Comments)    Patient stated,"I was given a Pneumonia shot at the same time I got the Tetanus shot, in the same arm."      Review of Systems:  Constitutional:  No  fever, no chills, No recent illness  HEENT: No  headache, no vision change, no hearing change  Cardiac: No  chest pain, No  pressure, No palpitations, No  Orthopnea  Respiratory:  No  shortness of breath. No  Cough  Gastrointestinal: No  abdominal pain, No  nausea, No  vomiting,  No  blood in stool, No  diarrhea, No  constipation   Musculoskeletal: No new myalgia/arthralgia  Skin: No  Rash, No other wounds/concerning lesions  Genitourinary: No  incontinence, No  abnormal genital bleeding, No abnormal genital discharge  Neurologic: No  weakness, No  dizziness  Psychiatric: No  concerns with depression, No  concerns with anxiety, No sleep problems, No mood problems  Exam:  BP 134/76   Pulse 94   Temp (!) 97.5 F (36.4 C)   Ht 4\' 11"  (1.499 m)   Wt 133 lb (60.3 kg)   SpO2 94%   BMI 26.86 kg/m   Constitutional: VS see above. General Appearance: alert, well-developed, well-nourished, NAD  Neck: No masses, trachea midline. No thyroid enlargement. No tenderness/mass appreciated. No lymphadenopathy  Respiratory: Normal respiratory effort. no wheeze, no rhonchi, no rales  Cardiovascular: S1/S2 normal, no murmur, no rub/gallop auscultated. RRR. No lower extremity edema.  Gastrointestinal: Nontender, no masses. No hepatomegaly, no splenomegaly. No hernia appreciated. Bowel sounds normal. Rectal exam deferred.   Musculoskeletal: Gait normal. No clubbing/cyanosis of digits.   Neurological: Normal balance/coordination. No tremor. No cranial nerve deficit on limited exam. Motor and sensation intact and symmetric. Cerebellar reflexes intact.   Skin: warm, dry,  intact. No rash/ulcer. No concerning nevi or subq nodules on limited exam.    Psychiatric: Normal judgment/insight. Normal mood and affect. Oriented x3.    No results found for this or any previous visit (from the past 72 hour(s)).  No results found.      ASSESSMENT/PLAN: The primary encounter diagnosis was Annual physical exam. Diagnoses of Prediabetes, Osteopenia, unspecified location, COPD, severe ([place]), Essential hypertension, Former smoker, and Lung nodule were also pertinent to this visit.      Patient Instructions  General Preventive Care  Labs: will need to monitor sugars (A1C test) every 4-6 months). At some point, for your age group, it's recommended to do HIV and Hepatitis C screening.   Tobacco: don't take it back up!   Alcohol: responsible moderation is ok for most adults - if you have concerns about your alcohol intake, please talk to me!   Exercise: as tolerated to  reduce risk of cardiovascular disease and diabetes. Strength training will also prevent osteoporosis.   Mental health: if need for mental health care (medicines, counseling, other), or concerns about moods, please let me know!   Sexual health: if need for STD testing, or if concerns with libido/pain problems, please let me know!   Advanced Directive: Living Will and/or Healthcare Power of Attorney recommended for all adults, regardless of age or health.  Vaccines  Flu vaccine: recommended for almost everyone, every fall.   Shingles vaccine: Shingrix recommended after age 65.  Pneumonia vaccines: Prevnar and Pneumovax recommended after age 52, sooner if smoker/COPD. Will repeat series at age 13.  Tetanus booster: Tdap recommended every 10 years. Due 2029.   COVID: recommended as soon as you're eligible! Please follow [email or web address] for updates on eligibility and vaccination appointment. As of now, we do not anticipate our clinic having vaccines anytime soon.  Cancer screenings   Colon  cancer screening: recommended for everyone age 71-75  Breast cancer screening: follow-up per oncologist   Cervical cancer screening: Pap every 1 to 5 years depending on age and other risk factors. Can usually stop at age 52 or w/ hysterectomy (no cervix to get cancer = no cervical cancer screening needed!).   Lung cancer screening: CT chest every year for those age 30 to 58 years old with ?30 pack year smoking history, who either currently smoke or have quit within the past 15 years. Infection screenings . HIV: recommended screening once age 13-65 . Gonorrhea/Chlamydia: screening as needed . Hepatitis C: recommended once for anyone born 56-1965 . TB: certain at-risk populations, or depending on work requirements and/or travel history Other . Bone Density Test: recommended for women at age 69       Visit summary with medication list and pertinent instructions was printed for patient to review. All questions at time of visit were answered - patient instructed to contact office with any additional concerns or updates. ER/RTC precautions were reviewed with the patient.      Please note: voice recognition software was used to produce this document, and typos may escape review. Please contact Dr. Sheppard Coil for any needed clarifications.     Follow-up plan: Return for 03/31/2019 staple removal. After that, 6 mos check-up w/ labs .

## 2019-03-25 NOTE — Telephone Encounter (Signed)
Called patient, she states when she was filling out paperwork for GI she spoke to the facility and she mailed them a signed release form for them to send to facility in [place], and they advised her they would get the records for her.

## 2019-03-25 NOTE — Patient Instructions (Addendum)
General Preventive Care  Labs: will need to monitor sugars (A1C test) every 4-6 months). At some point, for your age group, it's recommended to do HIV and Hepatitis C screening.   Tobacco: don't take it back up!   Alcohol: responsible moderation is ok for most adults - if you have concerns about your alcohol intake, please talk to me!   Exercise: as tolerated to reduce risk of cardiovascular disease and diabetes. Strength training will also prevent osteoporosis.   Mental health: if need for mental health care (medicines, counseling, other), or concerns about moods, please let me know!   Sexual health: if need for STD testing, or if concerns with libido/pain problems, please let me know!   Advanced Directive: Living Will and/or Healthcare Power of Attorney recommended for all adults, regardless of age or health.  Vaccines  Flu vaccine: recommended for almost everyone, every fall.   Shingles vaccine: Shingrix recommended after age 68.  Pneumonia vaccines: Prevnar and Pneumovax recommended after age 32, sooner if smoker/COPD. Will repeat series at age 64.  Tetanus booster: Tdap recommended every 10 years. Due 2029.   COVID: recommended as soon as you're eligible! Please follow [email or web address] for updates on eligibility and vaccination appointment. As of now, we do not anticipate our clinic having vaccines anytime soon.  Cancer screenings   Colon cancer screening: recommended for everyone age 4-75  Breast cancer screening: follow-up per oncologist   Cervical cancer screening: Pap every 1 to 5 years depending on age and other risk factors. Can usually stop at age 82 or w/ hysterectomy (no cervix to get cancer = no cervical cancer screening needed!).   Lung cancer screening: CT chest every year for those age 51 to 59 years old with ?30 pack year smoking history, who either currently smoke or have quit within the past 15 years. Infection screenings . HIV: recommended screening once  age 71-65 . Gonorrhea/Chlamydia: screening as needed . Hepatitis C: recommended once for anyone born 13-1965 . TB: certain at-risk populations, or depending on work requirements and/or travel history Other . Bone Density Test: recommended for women at age 80

## 2019-03-25 NOTE — Telephone Encounter (Signed)
Patient was previously referred to GI for repeat colonoscopy, they were apparently waiting on records from colonoscopy done in [place], can we call patient to see if she can get this information for [name], that seems to be the only thing that is holding up her referral to GI at this point, see referral order placed 11/2018

## 2019-03-25 NOTE — Telephone Encounter (Signed)
OK. GI never got them so she needs to contact GI then (see referral notes)

## 2019-03-26 NOTE — Telephone Encounter (Signed)
Patient aware.

## 2019-03-29 DIAGNOSIS — J449 Chronic obstructive pulmonary disease, unspecified: Secondary | ICD-10-CM | POA: Diagnosis not present

## 2019-03-31 ENCOUNTER — Other Ambulatory Visit: Payer: Self-pay | Admitting: *Deleted

## 2019-03-31 ENCOUNTER — Ambulatory Visit (INDEPENDENT_AMBULATORY_CARE_PROVIDER_SITE_OTHER): Payer: BC Managed Care – PPO | Admitting: Osteopathic Medicine

## 2019-03-31 ENCOUNTER — Encounter: Payer: Self-pay | Admitting: Osteopathic Medicine

## 2019-03-31 VITALS — BP 120/78 | HR 86 | Temp 97.9°F | Wt 135.0 lb

## 2019-03-31 DIAGNOSIS — Z87891 Personal history of nicotine dependence: Secondary | ICD-10-CM

## 2019-03-31 DIAGNOSIS — Z4802 Encounter for removal of sutures: Secondary | ICD-10-CM

## 2019-03-31 NOTE — Progress Notes (Signed)
Staple removal from scalp laceration ER notes reviewed, 10 staples placed per procedure note  10 staples removed today after cleaning skin with alcohol  Pt tolerated procedure well EBL 0

## 2019-04-01 ENCOUNTER — Other Ambulatory Visit: Payer: Self-pay

## 2019-04-01 ENCOUNTER — Ambulatory Visit (INDEPENDENT_AMBULATORY_CARE_PROVIDER_SITE_OTHER): Payer: BC Managed Care – PPO

## 2019-04-01 ENCOUNTER — Telehealth: Payer: Self-pay | Admitting: Pulmonary Disease

## 2019-04-01 DIAGNOSIS — Z170 Estrogen receptor positive status [ER+]: Secondary | ICD-10-CM

## 2019-04-01 DIAGNOSIS — J9611 Chronic respiratory failure with hypoxia: Secondary | ICD-10-CM

## 2019-04-01 DIAGNOSIS — C50912 Malignant neoplasm of unspecified site of left female breast: Secondary | ICD-10-CM

## 2019-04-01 DIAGNOSIS — J449 Chronic obstructive pulmonary disease, unspecified: Secondary | ICD-10-CM

## 2019-04-01 DIAGNOSIS — Z1231 Encounter for screening mammogram for malignant neoplasm of breast: Secondary | ICD-10-CM | POA: Diagnosis not present

## 2019-04-01 DIAGNOSIS — R918 Other nonspecific abnormal finding of lung field: Secondary | ICD-10-CM

## 2019-04-01 MED ORDER — BUDESONIDE-FORMOTEROL FUMARATE 160-4.5 MCG/ACT IN AERO: 2.0000 | INHALATION_SPRAY | Freq: Two times a day (BID) | RESPIRATORY_TRACT | 3 refills | Status: DC

## 2019-04-01 NOTE — Telephone Encounter (Signed)
Called the patient back and she stated she contacted [facility] who was sending her Spiriva, but they did not have a prescription for Symbicort, it was last under Dr. Lake Bells.  The patient was last seen in clinic on 12/22/18 by Dr. Loanne Drilling who is taking over her care as Dr. Lake Bells is no longer in clinic.  Based the office note, the patient is supposed to continue on Spiriva 2.5 and Symbicort 160. Refill for Symbicort sent to [facility]. Nothing further needed at this time.

## 2019-04-02 DIAGNOSIS — E119 Type 2 diabetes mellitus without complications: Secondary | ICD-10-CM | POA: Diagnosis not present

## 2019-04-02 DIAGNOSIS — B070 Plantar wart: Secondary | ICD-10-CM | POA: Diagnosis not present

## 2019-04-02 DIAGNOSIS — M2012 Hallux valgus (acquired), left foot: Secondary | ICD-10-CM | POA: Diagnosis not present

## 2019-04-02 DIAGNOSIS — M2011 Hallux valgus (acquired), right foot: Secondary | ICD-10-CM | POA: Diagnosis not present

## 2019-04-03 ENCOUNTER — Encounter: Payer: Self-pay | Admitting: Sports Medicine

## 2019-04-03 ENCOUNTER — Ambulatory Visit: Payer: BC Managed Care – PPO | Admitting: Sports Medicine

## 2019-04-03 ENCOUNTER — Other Ambulatory Visit: Payer: Self-pay

## 2019-04-03 DIAGNOSIS — M19011 Primary osteoarthritis, right shoulder: Secondary | ICD-10-CM

## 2019-04-03 NOTE — Progress Notes (Signed)
    Procedures performed today:    None.  Independent interpretation of notes and tests performed by another provider:   None.  Impression and Recommendations:    Primary osteoarthritis, right shoulder Genavive returns, she has glenohumeral OA, I injected her glenohumeral point of the last visit and she is doing very well, she is also taking NSAIDs. She will be judicious with the shoulder, though she does have lots of yard work to do in the spring. Return as needed.    ___________________________________________ Gwen Her. Dianah Field, M.D., ABFM., CAQSM. Primary Care and [place] Instructor of [employer] [facility]

## 2019-04-03 NOTE — Assessment & Plan Note (Signed)
Sherri Gibson returns, she has glenohumeral OA, I injected her glenohumeral point of the last visit and she is doing very well, she is also taking NSAIDs. She will be judicious with the shoulder, though she does have lots of yard work to do in the spring. Return as needed.

## 2019-04-06 ENCOUNTER — Telehealth: Payer: Self-pay | Admitting: *Deleted

## 2019-04-06 NOTE — Telephone Encounter (Signed)
-----   Message from Tish Men, MD sent at 04/06/2019  9:52 AM EST ----- Delrae Sawyers,  Can we let the patient know that her mammogram was normal, so it will just need to be repeated in a year?Thanks.  [place]  ----- Message ----- From: Interface, Rad Results In Sent: 04/01/2019   2:50 PM EST To: Tish Men, MD

## 2019-04-06 NOTE — Telephone Encounter (Signed)
As noted below by Dr. Maylon Peppers, I informed her that the mammogram was normal. She verbalized understanding.

## 2019-04-09 ENCOUNTER — Telehealth: Payer: Self-pay | Admitting: Acute Care

## 2019-04-09 NOTE — Telephone Encounter (Signed)
Spoke with pt and advised that CT is scheduled at [facility] on 04/13/19 at 1:00. PT verbalized understanding. Nothing further needed at this time.

## 2019-04-13 ENCOUNTER — Ambulatory Visit (INDEPENDENT_AMBULATORY_CARE_PROVIDER_SITE_OTHER): Payer: BC Managed Care – PPO

## 2019-04-13 ENCOUNTER — Encounter: Payer: Self-pay | Admitting: Acute Care

## 2019-04-13 ENCOUNTER — Ambulatory Visit: Payer: BC Managed Care – PPO

## 2019-04-13 ENCOUNTER — Other Ambulatory Visit: Payer: Self-pay

## 2019-04-13 ENCOUNTER — Ambulatory Visit (INDEPENDENT_AMBULATORY_CARE_PROVIDER_SITE_OTHER): Payer: BC Managed Care – PPO | Admitting: Acute Care

## 2019-04-13 DIAGNOSIS — Z87891 Personal history of nicotine dependence: Secondary | ICD-10-CM

## 2019-04-13 DIAGNOSIS — Z122 Encounter for screening for malignant neoplasm of respiratory organs: Secondary | ICD-10-CM | POA: Insufficient documentation

## 2019-04-13 NOTE — Patient Instructions (Signed)
Thank you for participating in the [facility]. It was our pleasure to meet you today. We will call you with the results of your scan within the next few days. Your scan will be assigned a Lung RADS category score by the physicians reading the scans.  This Lung RADS score determines follow up scanning.  See below for description of categories, and follow up screening recommendations. We will be in touch to schedule your follow up screening annually or based on recommendations of our providers. We will fax a copy of your scan results to your Primary Care Physician, or the physician who referred you to the program, to ensure they have the results. Please call the office if you have any questions or concerns regarding your scanning experience or results.  Our office number is [phone]. Please speak with Denise Phelps, RN. She is our Lung Cancer Screening RN. If she is unavailable when you call, please have the office staff send her a message. She will return your call at her earliest convenience. Remember, if your scan is normal, we will scan you annually as long as you continue to meet the criteria for the program. (Age 55-77, Current smoker or smoker who has quit within the last 15 years). If you are a smoker, remember, quitting is the single most powerful action that you can take to decrease your risk of lung cancer and other pulmonary, breathing related problems. We know quitting is hard, and we are here to help.  Please let us know if there is anything we can do to help you meet your goal of quitting. If you are a former smoker, congratulations. We are proud of you! Remain smoke free! Remember you can refer friends or family members through the number above.  We will screen them to make sure they meet criteria for the program. Thank you for helping us take better care of you by participating in Lung Screening.  Lung RADS Categories:  Lung RADS 1: no nodules  or definitely non-concerning nodules.  Recommendation is for a repeat annual scan in 12 months.  Lung RADS 2:  nodules that are non-concerning in appearance and behavior with a very low likelihood of becoming an active cancer. Recommendation is for a repeat annual scan in 12 months.  Lung RADS 3: nodules that are probably non-concerning , includes nodules with a low likelihood of becoming an active cancer.  Recommendation is for a 6-month repeat screening scan. Often noted after an upper respiratory illness. We will be in touch to make sure you have no questions, and to schedule your 6-month scan.  Lung RADS 4 A: nodules with concerning findings, recommendation is most often for a follow up scan in 3 months or additional testing based on our provider's assessment of the scan. We will be in touch to make sure you have no questions and to schedule the recommended 3 month follow up scan.  Lung RADS 4 B:  indicates findings that are concerning. We will be in touch with you to schedule additional diagnostic testing based on our provider's  assessment of the scan.   

## 2019-04-13 NOTE — Progress Notes (Signed)
Shared Decision Making Visit Lung Cancer Screening Program [phone])   Eligibility:  Age 59 y.o.  Pack Years Smoking History Calculation 110 PACK YEAR SMOKING HISTORY (# packs/per year x # years smoked)  Recent History of coughing up blood  no  Unexplained weight loss? no ( >Than 15 pounds within the last 6 months )  Prior History Lung / other cancer no (Diagnosis within the last 5 years already requiring surveillance chest CT Scans).  Smoking Status Former Smoker  Former Smokers: Years since quit: 1 year  Quit Date: 11/2017  Visit Components:  Discussion included one or more decision making aids. yes  Discussion included risk/benefits of screening. yes  Discussion included potential follow up diagnostic testing for abnormal scans. yes  Discussion included meaning and risk of over diagnosis. yes  Discussion included meaning and risk of False Positives. yes  Discussion included meaning of total radiation exposure. yes  Counseling Included:  Importance of adherence to annual lung cancer LDCT screening. yes  Impact of comorbidities on ability to participate in the program. yes  Ability and willingness to under diagnostic treatment. yes  Smoking Cessation Counseling:  Current Smokers:   Discussed importance of smoking cessation. Former smoker  Information about tobacco cessation classes and interventions provided to patient. yes  Patient provided with "ticket" for LDCT Scan. yes  Symptomatic Patient. no  CounselingNA  Diagnosis Code: Tobacco Use Z72.0  Asymptomatic Patient yes  Counseling (Intermediate counseling: > three minutes counseling) [id number]  Former Smokers:   Discussed the importance of maintaining cigarette abstinence. yes  Diagnosis Code: Personal History of Nicotine Dependence. [id number]  Information about tobacco cessation classes and interventions provided to patient. Yes  Patient provided with "ticket" for LDCT Scan. yes  Written Order  for Lung Cancer Screening with LDCT placed in Epic. Yes (CT Chest Lung Cancer Screening Low Dose W/O CM) [id number] Z12.2-Screening of respiratory organs Z87.891-Personal history of nicotine dependence  I spent 25 minutes of face to face time with Sherri Gibson discussing the risks and benefits of lung cancer screening. We viewed a power point together that explained in detail the above noted topics. We took the time to pause the power point at intervals to allow for questions to be asked and answered to ensure understanding. We discussed that she had taken the single most powerful action possible to decrease her risk of developing lung cancer when she quit smoking. I counseled her to remain smoke free, and to contact me if she ever had the desire to smoke again so that I can provide resources and tools to help support the effort to remain smoke free. We discussed the time and location of the scan, and that either  Doroteo Glassman RN or I will call with the results within  24-48 hours of receiving them. She has my card and contact information in the event she needs to speak with me, in addition to a copy of the power point we reviewed as a resource. She verbalized understanding of all of the above and had no further questions upon leaving the office.     I explained to the patient that there has been a high incidence of coronary artery disease noted on these exams. I explained that this is a non-gated exam therefore degree or severity cannot be determined. This patient is currently on statin therapy. I have asked the patient to follow-up with their PCP regarding any incidental finding of coronary artery disease and management with diet or medication as they feel is  clinically indicated. The patient verbalized understanding of the above and had no further questions.     Sherri Spatz, NP 04/13/2019 9:52 AM

## 2019-04-15 ENCOUNTER — Other Ambulatory Visit: Payer: Self-pay | Admitting: *Deleted

## 2019-04-15 ENCOUNTER — Telehealth: Payer: Self-pay | Admitting: Pulmonary Disease

## 2019-04-15 DIAGNOSIS — R918 Other nonspecific abnormal finding of lung field: Secondary | ICD-10-CM

## 2019-04-15 DIAGNOSIS — J449 Chronic obstructive pulmonary disease, unspecified: Secondary | ICD-10-CM

## 2019-04-15 DIAGNOSIS — J9611 Chronic respiratory failure with hypoxia: Secondary | ICD-10-CM

## 2019-04-15 MED ORDER — BUDESONIDE-FORMOTEROL FUMARATE 160-4.5 MCG/ACT IN AERO: 2.0000 | INHALATION_SPRAY | Freq: Two times a day (BID) | RESPIRATORY_TRACT | 0 refills | Status: DC

## 2019-04-16 ENCOUNTER — Other Ambulatory Visit: Payer: Self-pay | Admitting: *Deleted

## 2019-04-16 DIAGNOSIS — Z87891 Personal history of nicotine dependence: Secondary | ICD-10-CM

## 2019-04-16 NOTE — Progress Notes (Signed)
Please call patient and let them  know their  low dose Ct was read as a Lung RADS 2: nodules that are benign in appearance and behavior with a very low likelihood of becoming a clinically active cancer due to size or lack of growth. Recommendation per radiology is for a repeat LDCT in 12 months. Please let them  know we will order and schedule their  annual screening scan for 03/2020. Please let them  know there was notation of CAD on their  scan.  Please remind the patient  that this is a non-gated exam therefore degree or severity of disease  cannot be determined. Please have them  follow up with their PCP regarding potential risk factor modification, dietary therapy or pharmacologic therapy if clinically indicated. Pt.  is  currently on statin therapy. Please place order for annual  screening scan for  03/2020 and fax results to PCP. Thanks so much.

## 2019-04-16 NOTE — Progress Notes (Signed)
Please call patient and let them  know their  low dose Ct was read as a .lr.Please let them  know we will order and schedule their  annual screening scan for 03/2020 Please let them  know there was notation of CAD on their  scan.  Please remind the patient  that this is a non-gated exam therefore degree or severity of disease  cannot be determined. Please have them  follow up with their PCP regarding potential risk factor modification, dietary therapy or pharmacologic therapy if clinically indicated. Pt.  is  currently on statin therapy. Please place order for annual  screening scan for  03/2020 and fax results to PCP. Thanks so much.

## 2019-04-16 NOTE — Telephone Encounter (Signed)
Pt informed of CT results per Sarah Groce, NP.  PT verbalized understanding.  Copy sent to PCP.  Order placed for 1 yr f/u CT.  

## 2019-04-16 NOTE — Progress Notes (Signed)
Do you recommend further evaluation of the following finding noted on a LDCT:?  Upper Abdomen: 2.4 cm left suprarenal lesion has been incompletely visualized and is probably renal in origin with attenuation mildly higher than would be expected for a simple cyst.  Please advise. Thanks so much

## 2019-04-16 NOTE — Telephone Encounter (Signed)
I do not see where anyone from our office called her. She recently had a low dose CT done.  Sherri Gibson - did you try to reach the pt?

## 2019-04-26 DIAGNOSIS — J449 Chronic obstructive pulmonary disease, unspecified: Secondary | ICD-10-CM | POA: Diagnosis not present

## 2019-04-28 ENCOUNTER — Other Ambulatory Visit: Payer: Self-pay

## 2019-04-28 ENCOUNTER — Ambulatory Visit: Payer: BC Managed Care – PPO | Admitting: Pulmonary Disease

## 2019-04-28 ENCOUNTER — Encounter: Payer: Self-pay | Admitting: Pulmonary Disease

## 2019-04-28 VITALS — BP 115/70 | HR 100 | Temp 98.3°F | Ht 59.0 in | Wt 134.0 lb

## 2019-04-28 DIAGNOSIS — R918 Other nonspecific abnormal finding of lung field: Secondary | ICD-10-CM | POA: Diagnosis not present

## 2019-04-28 DIAGNOSIS — J449 Chronic obstructive pulmonary disease, unspecified: Secondary | ICD-10-CM | POA: Diagnosis not present

## 2019-04-28 DIAGNOSIS — J9611 Chronic respiratory failure with hypoxia: Secondary | ICD-10-CM

## 2019-04-28 NOTE — Progress Notes (Signed)
Subjective:   PATIENT ID: Sherri Gibson GENDER: female DOB: 1960-11-28, MRN: [id number]   HPI  Chief Complaint  Patient presents with  . Follow-up   Reason for Visit: Follow-up  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 Lake Bells patient, last seen September 2019.  She was initially diagnosed with COPD in 2010 and has been on oxygen since 2017. She has been compliant with her Symbicort 160-4.5 mcg 2 puffs twice daily and Spiriva 2.5 mcg 2 puffs daily. She has dyspnea with heavy exertion associated with occasional exertion. Denies chronic cough. Since she has quit smoking, she uses her albuterol 2-3 times in the last month. She has been wearing her oxygen 2L with exertion and sleep. With exertion her O2 will drop to 87% at home off oxygen. She lives in a 3 story house and will need to wear her oxygen with it. She walks regularly on her treadmill.  Interval She is compliant with her bronchodilators and wears her oxygen as needed. She has shortness of breath and hypoxemia with moderate exertion. Usually able to go upstairs without issue. She can perform yardwork and other activities of daily living.  Social History: She smoked for many years, typically 2 packs per day from age [age].  She quit with Chantix in 2018. 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  I have personally reviewed patient's past medical/family/social history/allergies/current medications.  Past Medical History:  Diagnosis Date  . Breast cancer ([place]) 2016   Left   . COPD (chronic obstructive pulmonary disease) ([place])   . High blood pressure   . High cholesterol      Outpatient Medications Prior to Visit  Medication Sig Dispense Refill  .  albuterol (PROAIR HFA) 108 (90 Base) MCG/ACT inhaler inhale 2 puff by inhalation route  every 4 - 6 hours as needed 18 g 3  . albuterol (PROVENTIL) (2.5 MG/3ML) 0.083% nebulizer solution Take 3 mLs (2.5 mg total) by nebulization every 2 (two) hours as needed for wheezing or shortness of breath. 75 mL 99  . amLODipine (NORVASC) 10 MG tablet Take 1 tablet (10 mg total) by mouth daily. 90 tablet 3  . aspirin 81 MG chewable tablet Chew by mouth.    Marland Kitchen atorvastatin (LIPITOR) 40 MG tablet TAKE 1 TABLET DAILY (DISCONTINUE PRAVASTATIN) 90 tablet 3  . betamethasone dipropionate (DIPROLENE) 0.05 % cream Apply topically 2 (two) times daily. To affected area(s) as needed 15 g 1  . budesonide-formoterol (SYMBICORT) 160-4.5 MCG/ACT inhaler Inhale 2 puffs into the lungs 2 (two) times daily. 3 Inhaler 0  . Calcium-Magnesium-Vitamin D (CALCIUM 1200+D3 PO) Take 1 capsule by mouth 2 (two) times daily.    . celecoxib (CELEBREX) 200 MG capsule One to 2 tablets by mouth daily as needed for pain. 180 capsule 3  . cyclobenzaprine (FLEXERIL) 10 MG tablet TAKE 1 TABLET THREE TIMES A DAY AS NEEDED FOR MUSCLE SPASMS 90 tablet 1  . loratadine (CLARITIN) 10 MG tablet Take 1 tablet (10 mg total) by mouth daily. 90 tablet 1  . losartan (COZAAR) 50 MG tablet Take 1 tablet (50 mg total) by mouth daily. 90 tablet 3  . metFORMIN (GLUCOPHAGE) 500 MG tablet Take 1 tablet (500 mg total) by mouth daily with breakfast.  90 tablet 1  . MITIGARE 0.6 MG CAPS TAKE 1 CAPSULE DAILY 90 capsule 3  . montelukast (SINGULAIR) 10 MG tablet Take 1 tablet (10 mg total) by mouth at bedtime. 90 tablet 3  . Salicylic Acid (OCCLUSAL-HP EX) Apply topically.    . Tiotropium Bromide Monohydrate (SPIRIVA RESPIMAT) 2.5 MCG/ACT AERS Inhale 2 puffs into the lungs daily. 12 g 3  . traZODone (DESYREL) 50 MG tablet TAKE 1 TABLET BY MOUTH EVERYDAY AT BEDTIME 90 tablet 1   No facility-administered medications prior to visit.    Review of Systems  Constitutional:  Negative for chills, diaphoresis, fever, malaise/fatigue and weight loss.  HENT: Negative for congestion.   Respiratory: Negative for cough, hemoptysis, sputum production, shortness of breath and wheezing.   Cardiovascular: Negative for chest pain, palpitations and leg swelling.   Objective:   Vitals:   04/28/19 1108  BP: 115/70  Pulse: 100  Temp: 98.3 F (36.8 C)  TempSrc: Temporal  SpO2: 97%  Weight: 134 lb (60.8 kg)  Height: '4\' 11"'  (1.499 m)   SpO2: 97 % O2 Device: None (Room air)  Physical Exam: General: Well-appearing, no acute distress HENT: [place], AT Eyes: EOMI, no scleral icterus Respiratory: Clear to auscultation bilaterally.  No crackles, wheezing or rales Cardiovascular: RRR, -M/R/G, no JVD Extremities:-Edema,-tenderness Neuro: AAO x4, CNII-XII grossly intact Skin: Intact, no rashes or bruising Psych: Normal mood, normal affect  Data Reviewed:  Imaging: 10/2015 CT chest [facility] multiple calcified granulomatous largest is 8 mm, 2 noncalcified 3 mm left lower lobe nodules 11/2016 CT chest 6-87m nodules Right upper an lower nodules ([place]    [place] [facility] FEV1 1.03 L 45% predicted, no air trapping, diffusion capacity 35% predicted  Imaging, labs and tests noted above have been reviewed independently by me.    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 Last exacerbation in 12/2018. Currently well-controlled on bronchodilators.  Severe COPD - well-controlled. Stable CONTINUE Symbicort 160-4.5 mcg 2 puffs twice daily.  CONTINUE Spiriva 2.5 mcg 2 puffs daily.  Encourage regular aerobic activity.  Chronic hypoxemic respiratory failure - stable Target oxygen  levels >88%. Wear your oxygen if it is <88% and at night.  Multiple lung nodules - stable Annual lung screen per schedule  Health Maintenance Immunization History  Administered Date(s) Administered  . Influenza, Seasonal, Injecte, Preservative Fre 10/15/2016  . Influenza,inj,Quad PF,6+ Mos 09/23/2017, 09/16/2018  . PFIZER SARS-COV-2 Vaccination 04/14/2019  . Pneumococcal Polysaccharide-23 05/29/2017  . Tdap 05/29/2017   CT Lung Screen - Due 03/2020  No orders of the defined types were placed in this encounter.  No orders of the defined types were placed in this encounter.   Return in about 6 months (around 10/29/2019).  [place] MD [facility] Care 04/28/2019 11:26 AM  Office Number [phone]

## 2019-04-28 NOTE — Patient Instructions (Signed)
Severe COPD - well-controlled. Stable CONTINUE Symbicort 160-4.5 mcg 2 puffs twice daily.  CONTINUE Spiriva 2.5 mcg 2 puffs daily.  Encourage regular aerobic activity.  Chronic hypoxemic respiratory failure - stable Target oxygen levels >88%. Wear your oxygen if it is <88% and at night.  Multiple lung nodules - stable Annual lung screen per schedule

## 2019-04-30 ENCOUNTER — Telehealth: Payer: Self-pay | Admitting: Acute Care

## 2019-04-30 ENCOUNTER — Encounter: Payer: Self-pay | Admitting: Osteopathic Medicine

## 2019-04-30 DIAGNOSIS — R1900 Intra-abdominal and pelvic swelling, mass and lump, unspecified site: Secondary | ICD-10-CM

## 2019-04-30 NOTE — Telephone Encounter (Signed)
Thanks for keeping me in the loop, I will reach out to the patient to schedule follow-up imaging for this.  Thank you!

## 2019-04-30 NOTE — Telephone Encounter (Signed)
I reached out to Dr. Tery Sanfilippo. He said this is probably a complicated cyst, but there is no way to know for sure without further imaging. He feels follow up is warranted. He said MRI abdomen with and without is the best single next  test , although CT Abdomen with and without contrast might also answer the question. Please follow up as you feel is clinically indicated as you know this patient well.  Please let me know if I can be of any further assistance.

## 2019-04-30 NOTE — Addendum Note (Signed)
Addended by: Maryla Morrow on: 04/30/2019 05:20 PM   Modules accepted: Orders

## 2019-04-30 NOTE — Telephone Encounter (Signed)
Please call patient: Her pulmonology office should have reached out to her about the CT results for her lungs.  No significant concerns there although the did pick up a small lesion above the left kidney that they could not fully visualize since this was a CT done mainly to look at the lungs.  The next best step to evaluate this mass be an MRI of the abdomen to rule out anything serious, though most likely it is a cyst and nothing to worry about, but we should be sure.  I went ahead and put in an order for the MRI, please let us know if any other questions.  I sent all this to the patient in my chart as well.

## 2019-04-30 NOTE — Telephone Encounter (Signed)
I wanted to make sure you saw the results of the low dose CT on this patient.  We will re-scan her in 12 months. We will order and schedule and let you know the results.  I wanted to make sure you saw the notation of a 2.4 cm left suprarenal lesion has been incompletely visualized and is probably renal in origin with attenuation mildly higher than would be expected for a simple cyst.  This is not included in the impression by radiology , but I wanted to make sure you were aware in the event you wanted to do any additional follow up. Please let me know if I can be of any additional assistance. Thanks so much  Low Dose CT Chest 04/13/2019  Cardiovascular: The heart size is normal. Small pericardial effusion. Coronary artery calcification is evident. Atherosclerotic calcification is noted in the wall of the thoracic aorta.  Mediastinum/Nodes: No mediastinal lymphadenopathy. No evidence for gross hilar lymphadenopathy although assessment is limited by the lack of intravenous contrast on today's study. The esophagus has normal imaging features. There is no axillary lymphadenopathy.  Lungs/Pleura: Centrilobular emphsyema noted. Atelectasis or scarring noted in the lingula calcified granulomata are noted scattered in the lungs bilaterally. Several noncalcified tiny pulmonary nodules are associated, measuring up to maximum volume derived equivalent diameter of 3 mm no suspicious pulmonary nodule or mass. No focal airspace consolidation. No pleural effusion.  Upper Abdomen: 2.4 cm left suprarenal lesion has been incompletely visualized and is probably renal in origin with attenuation mildly higher than would be expected for a simple cyst.  Musculoskeletal: No worrisome lytic or sclerotic osseous abnormality. Degenerative changes noted left shoulder.  IMPRESSION: Lung-RADS Category 2, benign appearance or behavior. Continue annual screening with low-dose chest CT without contrast in 12  months.  Emphysema (ICD10-J43.9) and Aortic Atherosclerosis (ICD10-170.0)

## 2019-05-04 NOTE — Telephone Encounter (Signed)
As per pt, she had a follow up appt with Pulmonologist on Friday. Aware and agreeable with recommendations. Pt aware to contact the office if she doesn't hear back from Imaging regarding appt for MRI.

## 2019-05-07 DIAGNOSIS — M2011 Hallux valgus (acquired), right foot: Secondary | ICD-10-CM | POA: Diagnosis not present

## 2019-05-07 DIAGNOSIS — E119 Type 2 diabetes mellitus without complications: Secondary | ICD-10-CM | POA: Diagnosis not present

## 2019-05-07 DIAGNOSIS — B070 Plantar wart: Secondary | ICD-10-CM | POA: Diagnosis not present

## 2019-05-07 DIAGNOSIS — M2012 Hallux valgus (acquired), left foot: Secondary | ICD-10-CM | POA: Diagnosis not present

## 2019-05-13 ENCOUNTER — Other Ambulatory Visit: Payer: Self-pay | Admitting: Osteopathic Medicine

## 2019-05-13 DIAGNOSIS — I10 Essential (primary) hypertension: Secondary | ICD-10-CM

## 2019-05-13 NOTE — Progress Notes (Signed)
Labs for MRI. Pt has HTN

## 2019-05-18 LAB — COMPLETE METABOLIC PANEL WITH GFR
AG Ratio: 1.6 (calc) (ref 1.0–2.5)
ALT: 21 U/L (ref 6–29)
AST: 28 U/L (ref 10–35)
Albumin: 4.4 g/dL (ref 3.6–5.1)
Alkaline phosphatase (APISO): 78 U/L (ref 37–153)
BUN: 16 mg/dL (ref 7–25)
CO2: 27 mmol/L (ref 20–32)
Calcium: 9.9 mg/dL (ref 8.6–10.4)
Chloride: 102 mmol/L (ref 98–110)
Creat: 0.83 mg/dL (ref 0.50–1.05)
GFR, Est African American: 90 mL/min/{1.73_m2} (ref >=60)
GFR, Est Non African American: 78 mL/min/{1.73_m2} (ref >=60)
Globulin: 2.7 g/dL (calc) (ref 1.9–3.7)
Glucose, Bld: 85 mg/dL (ref 65–139)
Potassium: 4.9 mmol/L (ref 3.5–5.3)
Sodium: 139 mmol/L (ref 135–146)
Total Bilirubin: 0.5 mg/dL (ref 0.2–1.2)
Total Protein: 7.1 g/dL (ref 6.1–8.1)

## 2019-05-19 NOTE — Progress Notes (Signed)
Pt has seen results on MyChart.

## 2019-05-25 ENCOUNTER — Other Ambulatory Visit: Payer: Self-pay

## 2019-05-25 ENCOUNTER — Ambulatory Visit: Payer: BC Managed Care – PPO

## 2019-05-25 DIAGNOSIS — N2889 Other specified disorders of kidney and ureter: Secondary | ICD-10-CM | POA: Diagnosis not present

## 2019-05-25 DIAGNOSIS — R1900 Intra-abdominal and pelvic swelling, mass and lump, unspecified site: Secondary | ICD-10-CM

## 2019-05-25 MED ORDER — GADOBUTROL 1 MMOL/ML IV SOLN
6.0000 mL | Freq: Once | INTRAVENOUS | Status: AC | PRN
  Administered 2019-05-25: 7.5 mL via INTRAVENOUS

## 2019-05-27 DIAGNOSIS — J449 Chronic obstructive pulmonary disease, unspecified: Secondary | ICD-10-CM | POA: Diagnosis not present

## 2019-06-01 ENCOUNTER — Other Ambulatory Visit: Payer: Self-pay | Admitting: Osteopathic Medicine

## 2019-06-01 DIAGNOSIS — R7303 Prediabetes: Secondary | ICD-10-CM

## 2019-06-26 DIAGNOSIS — J449 Chronic obstructive pulmonary disease, unspecified: Secondary | ICD-10-CM | POA: Diagnosis not present

## 2019-07-10 ENCOUNTER — Encounter: Payer: Self-pay | Admitting: Sports Medicine

## 2019-07-10 ENCOUNTER — Ambulatory Visit: Payer: BC Managed Care – PPO | Admitting: Sports Medicine

## 2019-07-10 DIAGNOSIS — M19011 Primary osteoarthritis, right shoulder: Secondary | ICD-10-CM

## 2019-07-10 DIAGNOSIS — M19012 Primary osteoarthritis, left shoulder: Secondary | ICD-10-CM

## 2019-07-10 MED ORDER — TRAMADOL HCL 50 MG PO TABS: 50.0000 mg | ORAL_TABLET | Freq: Three times a day (TID) | ORAL | 0 refills | Status: DC | PRN

## 2019-07-10 NOTE — Progress Notes (Signed)
    Procedures performed today:    Procedure: Real-time Ultrasound Guided injection of the left glenohumeral joint Device: Samsung HS60  Verbal informed consent obtained.  Time-out conducted.  Noted no overlying erythema, induration, or other signs of local infection.  Skin prepped in a sterile fashion.  Local anesthesia: Topical Ethyl chloride.  With sterile technique and under real time ultrasound guidance: 1 cc Kenalog 40, 2 cc lidocaine, 2 cc bupivacaine injected easily Completed without difficulty  Pain immediately resolved suggesting accurate placement of the medication.  Advised to call if fevers/chills, erythema, induration, drainage, or persistent bleeding.  Images permanently stored and available for review in the ultrasound unit.  Impression: Technically successful ultrasound guided injection.  Procedure: Real-time Ultrasound Guided injection of the right glenohumeral joint Device: Samsung HS60  Verbal informed consent obtained.  Time-out conducted.  Noted no overlying erythema, induration, or other signs of local infection.  Skin prepped in a sterile fashion.  Local anesthesia: Topical Ethyl chloride.  With sterile technique and under real time ultrasound guidance: 1 cc Kenalog 40, 2 cc lidocaine, 2 cc bupivacaine injected easily Completed without difficulty  Pain immediately resolved suggesting accurate placement of the medication.  Advised to call if fevers/chills, erythema, induration, drainage, or persistent bleeding.  Images permanently stored and available for review in the ultrasound unit.  Impression: Technically successful ultrasound guided injection.  Independent interpretation of notes and tests performed by another provider:   None.  Brief History, Exam, Impression, and Recommendations:    Primary osteoarthritis of both shoulders This is a pleasant 59 year old female, her husband has stage III lung cancer, going through chemo and radiation but she is  having to do a lot more work which has irritated her shoulders. She did well after a right glenohumeral joint injection back in February, repeat injection today but this time bilateral, adding some tramadol for pain, discontinue Celebrex which does not seem to be helping. Return to see me on an as-needed basis.    ___________________________________________ Gwen Her. Dianah Field, M.D., ABFM., CAQSM. Primary Care and [place] Instructor of [place] of [facility] of Medicine

## 2019-07-10 NOTE — Assessment & Plan Note (Signed)
This is a pleasant 60 year old female, her husband has stage III lung cancer, going through chemo and radiation but she is having to do a lot more work which has irritated her shoulders. She did well after a right glenohumeral joint injection back in February, repeat injection today but this time bilateral, adding some tramadol for pain, discontinue Celebrex which does not seem to be helping. Return to see me on an as-needed basis.

## 2019-07-16 ENCOUNTER — Other Ambulatory Visit: Payer: Self-pay | Admitting: *Deleted

## 2019-07-16 DIAGNOSIS — M2011 Hallux valgus (acquired), right foot: Secondary | ICD-10-CM | POA: Diagnosis not present

## 2019-07-16 DIAGNOSIS — B070 Plantar wart: Secondary | ICD-10-CM | POA: Diagnosis not present

## 2019-07-16 DIAGNOSIS — M19011 Primary osteoarthritis, right shoulder: Secondary | ICD-10-CM

## 2019-07-16 DIAGNOSIS — E119 Type 2 diabetes mellitus without complications: Secondary | ICD-10-CM | POA: Diagnosis not present

## 2019-07-16 DIAGNOSIS — M19012 Primary osteoarthritis, left shoulder: Secondary | ICD-10-CM

## 2019-07-16 DIAGNOSIS — M2012 Hallux valgus (acquired), left foot: Secondary | ICD-10-CM | POA: Diagnosis not present

## 2019-07-16 MED ORDER — TRAMADOL HCL 50 MG PO TABS: 50.0000 mg | ORAL_TABLET | Freq: Three times a day (TID) | ORAL | 0 refills | Status: DC | PRN

## 2019-07-22 ENCOUNTER — Other Ambulatory Visit: Payer: Self-pay | Admitting: Osteopathic Medicine

## 2019-07-27 ENCOUNTER — Other Ambulatory Visit: Payer: Self-pay

## 2019-07-27 ENCOUNTER — Other Ambulatory Visit: Payer: Self-pay | Admitting: Osteopathic Medicine

## 2019-07-27 DIAGNOSIS — J449 Chronic obstructive pulmonary disease, unspecified: Secondary | ICD-10-CM | POA: Diagnosis not present

## 2019-07-27 MED ORDER — LOSARTAN POTASSIUM 50 MG PO TABS: 50.0000 mg | ORAL_TABLET | Freq: Every day | ORAL | 0 refills | Status: DC

## 2019-08-12 ENCOUNTER — Other Ambulatory Visit: Payer: Self-pay | Admitting: Sports Medicine

## 2019-08-12 DIAGNOSIS — M19011 Primary osteoarthritis, right shoulder: Secondary | ICD-10-CM

## 2019-08-12 DIAGNOSIS — M19012 Primary osteoarthritis, left shoulder: Secondary | ICD-10-CM

## 2019-08-15 ENCOUNTER — Other Ambulatory Visit: Payer: Self-pay | Admitting: Osteopathic Medicine

## 2019-08-15 DIAGNOSIS — I10 Essential (primary) hypertension: Secondary | ICD-10-CM

## 2019-08-21 ENCOUNTER — Encounter: Payer: Self-pay | Admitting: Sports Medicine

## 2019-08-21 ENCOUNTER — Ambulatory Visit (INDEPENDENT_AMBULATORY_CARE_PROVIDER_SITE_OTHER): Payer: BC Managed Care – PPO | Admitting: Sports Medicine

## 2019-08-21 DIAGNOSIS — M19011 Primary osteoarthritis, right shoulder: Secondary | ICD-10-CM

## 2019-08-21 DIAGNOSIS — M19012 Primary osteoarthritis, left shoulder: Secondary | ICD-10-CM | POA: Diagnosis not present

## 2019-08-21 MED ORDER — TRAMADOL HCL 50 MG PO TABS: 50.0000 mg | ORAL_TABLET | Freq: Three times a day (TID) | ORAL | 3 refills | Status: DC | PRN

## 2019-08-21 NOTE — Progress Notes (Signed)
    Procedures performed today:    None.  Independent interpretation of notes and tests performed by another provider:   None.  Brief History, Exam, Impression, and Recommendations:    Primary osteoarthritis of both shoulders Sherri Gibson is a very pleasant 59 year old female, she has bilateral glenohumeral osteoarthritis, at the last visit we injected both of her glenohumeral joints, she had good initial relief for a couple of weeks and then a recurrence of pain, her husband has stage III lung cancer, going through chemo, radiation and possibly starting immunotherapy. She has 2 help him out a lot, he will move him around, I did add some tramadol at the last visit she feels as though it helped significantly, to tramadol.  Keep her highly functional, I am going to refill this and we can do this indefinitely. I can give her up to 3 tramadol per day to start out with. Return as needed.    ___________________________________________ Gwen Her. Dianah Field, M.D., ABFM., CAQSM. Primary Care and [place] Instructor of [place] of [facility] of Medicine

## 2019-08-21 NOTE — Assessment & Plan Note (Signed)
Sherri Gibson is a very pleasant 59 year old female, she has bilateral glenohumeral osteoarthritis, at the last visit we injected both of her glenohumeral joints, she had good initial relief for a couple of weeks and then a recurrence of pain, her husband has stage III lung cancer, going through chemo, radiation and possibly starting immunotherapy. She has 2 help him out a lot, he will move him around, I did add some tramadol at the last visit she feels as though it helped significantly, to tramadol.  Keep her highly functional, I am going to refill this and we can do this indefinitely. I can give her up to 3 tramadol per day to start out with. Return as needed.

## 2019-08-26 DIAGNOSIS — J449 Chronic obstructive pulmonary disease, unspecified: Secondary | ICD-10-CM | POA: Diagnosis not present

## 2019-08-31 ENCOUNTER — Other Ambulatory Visit: Payer: Self-pay | Admitting: Pulmonary Disease

## 2019-08-31 MED ORDER — ALBUTEROL SULFATE HFA 108 (90 BASE) MCG/ACT IN AERS: INHALATION_SPRAY | RESPIRATORY_TRACT | 1 refills | Status: DC

## 2019-09-03 DIAGNOSIS — E119 Type 2 diabetes mellitus without complications: Secondary | ICD-10-CM | POA: Diagnosis not present

## 2019-09-03 DIAGNOSIS — M2012 Hallux valgus (acquired), left foot: Secondary | ICD-10-CM | POA: Diagnosis not present

## 2019-09-03 DIAGNOSIS — B070 Plantar wart: Secondary | ICD-10-CM | POA: Diagnosis not present

## 2019-09-03 DIAGNOSIS — M2011 Hallux valgus (acquired), right foot: Secondary | ICD-10-CM | POA: Diagnosis not present

## 2019-09-04 ENCOUNTER — Other Ambulatory Visit: Payer: Self-pay

## 2019-09-04 ENCOUNTER — Emergency Department
Admission: EM | Admit: 2019-09-04 | Discharge: 2019-09-04 | Disposition: A | Discharge status: Home / Self Care | Payer: BC Managed Care – PPO | Source: Home / Self Care | Attending: Family Medicine | Admitting: Family Medicine

## 2019-09-04 DIAGNOSIS — R040 Epistaxis: Secondary | ICD-10-CM

## 2019-09-04 DIAGNOSIS — J302 Other seasonal allergic rhinitis: Secondary | ICD-10-CM

## 2019-09-04 MED ORDER — PREDNISONE 20 MG PO TABS
ORAL_TABLET | ORAL | 0 refills | Status: DC
Start: 2019-09-04 — End: 2019-10-30

## 2019-09-04 MED ORDER — OXYMETAZOLINE HCL 0.05 % NA SOLN
1.0000 | Freq: Two times a day (BID) | NASAL | Status: DC
  Administered 2019-09-04: 1 via NASAL

## 2019-09-04 NOTE — ED Provider Notes (Signed)
[name]    CSN: [id number] Arrival date & time: 09/04/19  1456      History   Chief Complaint Chief Complaint  Patient presents with  . Epistaxis    HPI Sherri Gibson is a 59 y.o. female.   Patient developed an anterior nosebleed about an hour ago that she was not able to stop by inserting tissue paper.  She has seasonal allergic rhinitis and has been having more frequent nosebleeds during the past several weeks.  She is also on nasal O2 because of her COPD, resulting increased drying of her nasal passages.  She does not take anticoagulants other than low dose aspirin.  The history is provided by the patient and the spouse.  Epistaxis Location:  R nare Severity:  Moderate Duration:  1 hour Timing:  Constant Progression:  Unchanged Chronicity:  Recurrent Context: aspirin use, home oxygen and hypertension   Context: not anticoagulants, not bleeding disorder, not foreign body, not recent infection, not thrombocytopenia and not trauma   Relieved by:  Nothing Worsened by:  Nothing Ineffective treatments:  Nasal tampon and applying pressure Associated symptoms: congestion   Associated symptoms: no cough, no dizziness, no facial pain, no fever, no sinus pain, no sneezing and no sore throat   Risk factors: allergies and frequent nosebleeds     Past Medical History:  Diagnosis Date  . Breast cancer ([place]) 2016   Left   . COPD (chronic obstructive pulmonary disease) ([place])   . High blood pressure   . High cholesterol     Patient Active Problem List   Diagnosis Date Noted  . Encounter for screening for lung cancer 04/13/2019  . Primary osteoarthritis of both shoulders 03/06/2019  . Chronic hypoxemic respiratory failure ([place]) 12/22/2018  . Osteopenia 12/20/2017  . Prediabetes 09/23/2017  . Colonoscopy refused 05/29/2017  . COPD, severe ([place]) 05/29/2017  . Multiple pulmonary nodules 05/29/2017  . Trigger thumb, right thumb 05/29/2017  . Primary  osteoarthritis of first carpometacarpal joint of left hand 05/29/2017  . Kidney lesion 12/14/2016  . Erythrocytosis 11/13/2016  . Breast cancer ([place]) 09/20/2016  . Essential hypertension 09/20/2016  . Insomnia 09/20/2016  . Mixed hyperlipidemia 09/20/2016    Past Surgical History:  Procedure Laterality Date  . BREAST BIOPSY    . BREAST LUMPECTOMY Left   . BREAST LUMPECTOMY WITH AXILLARY LYMPH NODE BIOPSY  2016  . PARTIAL HYSTERECTOMY      OB History   No obstetric history on file.      Home Medications    Prior to Admission medications   Medication Sig Start Date End Date Taking? Authorizing Provider  cimetidine (TAGAMET) 800 MG tablet Take 800 mg by mouth at bedtime.   Yes [provider]  albuterol (PROAIR HFA) 108 (90 Base) MCG/ACT inhaler inhale 2 puff by inhalation route  every 4 - 6 hours as needed 08/31/19   Margaretha Seeds, MD  albuterol (PROVENTIL) (2.5 MG/3ML) 0.083% nebulizer solution Take 3 mLs (2.5 mg total) by nebulization every 2 (two) hours as needed for wheezing or shortness of breath. 07/31/18   Emeterio Reeve, DO  amLODipine (NORVASC) 10 MG tablet TAKE 1 TABLET DAILY 08/17/19   Emeterio Reeve, DO  aspirin 81 MG chewable tablet Chew by mouth.    [provider]  atorvastatin (LIPITOR) 40 MG tablet TAKE 1 TABLET DAILY (DISCONTINUE PRAVASTATIN) 11/05/18   Emeterio Reeve, DO  betamethasone dipropionate (DIPROLENE) 0.05 % cream Apply topically 2 (two) times daily. To affected area(s)  as needed 10/09/18   Emeterio Reeve, DO  budesonide-formoterol [name]) 160-4.5 MCG/ACT inhaler Inhale 2 puffs into the lungs 2 (two) times daily. 04/15/19   Margaretha Seeds, MD  Calcium-Magnesium-Vitamin D (CALCIUM 1200+D3 PO) Take 1 capsule by mouth 2 (two) times daily.    [provider]  cyclobenzaprine (FLEXERIL) 10 MG tablet TAKE 1 TABLET THREE TIMES A DAY AS NEEDED FOR MUSCLE SPASMS 02/09/19   Emeterio Reeve, DO  loratadine (CLARITIN)  10 MG tablet TAKE 1 TABLET BY MOUTH EVERY DAY 07/22/19   Emeterio Reeve, DO  losartan (COZAAR) 50 MG tablet Take 1 tablet (50 mg total) by mouth daily. 07/27/19   Emeterio Reeve, DO  metFORMIN (GLUCOPHAGE) 500 MG tablet TAKE 1 TABLET DAILY WITH BREAKFAST 06/01/19   Emeterio Reeve, DO  MITIGARE 0.6 MG CAPS TAKE 1 CAPSULE DAILY 09/18/18   Emeterio Reeve, DO  montelukast (SINGULAIR) 10 MG tablet Take 1 tablet (10 mg total) by mouth at bedtime. 07/30/18   Emeterio Reeve, DO  predniSONE (DELTASONE) 20 MG tablet Take one tab by mouth twice daily for 3 days, then one daily. Take with food. 09/04/19   Kandra Nicolas, MD  Salicylic Acid (OCCLUSAL-HP EX) Apply topically.    [provider]  Tiotropium Bromide Monohydrate (SPIRIVA RESPIMAT) 2.5 MCG/ACT AERS Inhale 2 puffs into the lungs daily. 12/22/18   Margaretha Seeds, MD  traMADol (ULTRAM) 50 MG tablet Take 1-2 tablets (50-100 mg total) by mouth every 8 (eight) hours as needed for moderate pain. Maximum 6 tabs per day. 08/21/19   Silverio Decamp, MD  traZODone (DESYREL) 50 MG tablet TAKE 1 TABLET BY MOUTH EVERYDAY AT BEDTIME 02/09/19   Emeterio Reeve, DO    Family History Family History  Problem Relation Age of Onset  . Breast cancer Mother   . Diabetes Brother     Social History Social History   Tobacco Use  . Smoking status: Former Smoker    Packs/day: 2.50    Years: 44.00    Pack years: 110.00    Types: Cigarettes    Quit date: 11/29/2016    Years since quitting: 2.7  . Smokeless tobacco: Never Used  Vaping Use  . Vaping Use: Never used  Substance Use Topics  . Alcohol use: Not Currently  . Drug use: Never     Allergies   Codeine and Tetanus toxoids   Review of Systems Review of Systems  Constitutional: Negative for chills, diaphoresis, fatigue and fever.  HENT: Positive for congestion, nosebleeds and rhinorrhea. Negative for ear pain, sinus pain, sneezing and sore throat.   Respiratory:  Negative for cough.   Neurological: Negative for dizziness and light-headedness.  All other systems reviewed and are negative.    Physical Exam Triage Vital Signs ED Triage Vitals  Enc Vitals Group     BP 09/04/19 1505 117/80     Pulse Rate 09/04/19 1505 (!) 115     Resp 09/04/19 1505 20     Temp 09/04/19 1505 97.7 F (36.5 C)     Temp Source 09/04/19 1505 Oral     SpO2 09/04/19 1505 98 %     Weight --      Height --      Head Circumference --      Peak Flow --      Pain Score 09/04/19 1503 0     Pain Loc --      Pain Edu? --      Excl. in [place]? --  No data found.  Updated Vital Signs BP 117/80 (BP Location: Right Arm)   Pulse (!) 115   Temp 97.7 F (36.5 C) (Oral)   Resp 20   SpO2 98%   Visual Acuity Right Eye Distance:   Left Eye Distance:   Bilateral Distance:    Right Eye Near:   Left Eye Near:    Bilateral Near:     Physical Exam Vitals and nursing note reviewed.  Constitutional:      General: She is not in acute distress. HENT:     Head: Normocephalic.     Right Ear: Tympanic membrane normal.     Left Ear: Tympanic membrane normal.     Nose: Congestion present.     Comments: After right epistaxis ceased, exam of right nares reveals congested turbinates but no sites of active bleeding.    Mouth/Throat:     Pharynx: Oropharynx is clear.  Cardiovascular:     Rate and Rhythm: Tachycardia present.  Pulmonary:     Effort: Pulmonary effort is normal.  Musculoskeletal:     Cervical back: Neck supple.  Lymphadenopathy:     Cervical: No cervical adenopathy.  Skin:    General: Skin is warm and dry.  Neurological:     Mental Status: She is alert.      UC Treatments / Results  Labs (all labs ordered are listed, but only abnormal results are displayed) Labs Reviewed - No data to display  EKG   Radiology No results found.  Procedures Procedures  Afrin spray placed right nostril followed by insertion of gauze.  Patient instructed to apply  pressure to nose.  Medications Ordered in UC Medications  oxymetazoline (AFRIN) 0.05 % nasal spray 1 spray (1 spray Each Nare Given 09/04/19 1521)    Initial Impression / Assessment and Plan / UC Course  I have reviewed the triage vital signs and the nursing notes.  Pertinent labs & imaging results that were available during my care of the patient were reviewed by me and considered in my medical decision making (see chart for details).    Nosebleed likely facilitated by increased sinus congestion from allergic rhinitis.  Will begin prednisone burst/taper.   Patient instructed in proper treatment of epistaxis. Followup with ENT if symptoms persist.   Final Clinical Impressions(s) / UC Diagnoses   Final diagnoses:  Right-sided epistaxis  Seasonal allergic rhinitis, unspecified trigger     Discharge Instructions     Follow instructions included for controlling a nosebleed.  If symptoms become significantly worse during the night or over the weekend, proceed to the local emergency room.     ED Prescriptions    Medication Sig Dispense Auth. Provider   predniSONE (DELTASONE) 20 MG tablet Take one tab by mouth twice daily for 3 days, then one daily. Take with food. 9 tablet Kandra Nicolas, MD        Kandra Nicolas, MD 09/06/19 [phone]

## 2019-09-04 NOTE — Discharge Instructions (Addendum)
Follow instructions included for controlling a nosebleed.  If symptoms become significantly worse during the night or over the weekend, proceed to the local emergency room.

## 2019-09-04 NOTE — ED Triage Notes (Signed)
Patient presents to Urgent Care with complaints of nosebleed since earlier today around an hour ago. Patient reports she is on O2 baseline, has been drying her nose out more than usual. Pt states she has been having frequent nosebleeds over the past few weeks but none of them have lasted this long. Gauze inserted into right nostril upon arrival, pt holding pressure .

## 2019-09-04 NOTE — ED Notes (Signed)
Reassessed patient for epistaxis. Gauze packing removed from right nostril, no active bleeding noted at this time. Patient will let staff know if the bleeding restarts, will continue to monitor.

## 2019-09-18 DIAGNOSIS — R040 Epistaxis: Secondary | ICD-10-CM | POA: Diagnosis not present

## 2019-09-22 DIAGNOSIS — R040 Epistaxis: Secondary | ICD-10-CM | POA: Diagnosis not present

## 2019-09-26 DIAGNOSIS — J449 Chronic obstructive pulmonary disease, unspecified: Secondary | ICD-10-CM | POA: Diagnosis not present

## 2019-09-29 DIAGNOSIS — R040 Epistaxis: Secondary | ICD-10-CM | POA: Diagnosis not present

## 2019-10-08 DIAGNOSIS — B070 Plantar wart: Secondary | ICD-10-CM | POA: Diagnosis not present

## 2019-10-08 DIAGNOSIS — E119 Type 2 diabetes mellitus without complications: Secondary | ICD-10-CM | POA: Diagnosis not present

## 2019-10-13 ENCOUNTER — Other Ambulatory Visit: Payer: Self-pay | Admitting: Osteopathic Medicine

## 2019-10-25 ENCOUNTER — Other Ambulatory Visit: Payer: Self-pay | Admitting: Osteopathic Medicine

## 2019-10-27 ENCOUNTER — Other Ambulatory Visit: Payer: Self-pay | Admitting: Osteopathic Medicine

## 2019-10-27 DIAGNOSIS — J449 Chronic obstructive pulmonary disease, unspecified: Secondary | ICD-10-CM | POA: Diagnosis not present

## 2019-10-29 ENCOUNTER — Other Ambulatory Visit: Payer: Self-pay | Admitting: Osteopathic Medicine

## 2019-10-30 ENCOUNTER — Ambulatory Visit (INDEPENDENT_AMBULATORY_CARE_PROVIDER_SITE_OTHER): Payer: BC Managed Care – PPO

## 2019-10-30 ENCOUNTER — Other Ambulatory Visit: Payer: Self-pay

## 2019-10-30 ENCOUNTER — Ambulatory Visit (INDEPENDENT_AMBULATORY_CARE_PROVIDER_SITE_OTHER): Payer: BC Managed Care – PPO | Admitting: Sports Medicine

## 2019-10-30 DIAGNOSIS — M19012 Primary osteoarthritis, left shoulder: Secondary | ICD-10-CM

## 2019-10-30 DIAGNOSIS — M19011 Primary osteoarthritis, right shoulder: Secondary | ICD-10-CM

## 2019-10-30 NOTE — Progress Notes (Signed)
    Procedures performed today:    Procedure: Real-time Ultrasound Guided injection of the left glenohumeral joint Device: Samsung HS60  Verbal informed consent obtained.  Time-out conducted.  Noted no overlying erythema, induration, or other signs of local infection.  Skin prepped in a sterile fashion.  Local anesthesia: Topical Ethyl chloride.  With sterile technique and under real time ultrasound guidance: 1 cc Kenalog 40, 2 cc lidocaine, 2 cc bupivacaine injected easily Completed without difficulty  Pain immediately resolved suggesting accurate placement of the medication.  Advised to call if fevers/chills, erythema, induration, drainage, or persistent bleeding.  Images permanently stored and available for review in PACS.  Impression: Technically successful ultrasound guided injection.  Procedure: Real-time Ultrasound Guided injection of the right glenohumeral joint Device: Samsung HS60  Verbal informed consent obtained.  Time-out conducted.  Noted no overlying erythema, induration, or other signs of local infection.  Skin prepped in a sterile fashion.  Local anesthesia: Topical Ethyl chloride.  With sterile technique and under real time ultrasound guidance: 1 cc Kenalog 40, 2 cc lidocaine, 2 cc bupivacaine injected easily Completed without difficulty  Pain immediately resolved suggesting accurate placement of the medication.  Advised to call if fevers/chills, erythema, induration, drainage, or persistent bleeding.  Images permanently stored and available for review in PACS.  Impression: Technically successful ultrasound guided injection.  Independent interpretation of notes and tests performed by another provider:   None.  Brief History, Exam, Impression, and Recommendations:    Primary osteoarthritis of both shoulders Bilateral glenohumeral joint injections above, she also uses tramadol 1-2 times daily. Last injections were back in June. This is appropriate and we can  continue this long-term, she is currently caring for her husband, stage III lung cancer but he is doing okay currently. She can return to see me on an as-needed basis.    ___________________________________________ Gwen Her. Dianah Field, M.D., ABFM., CAQSM. Primary Care and [place] [employer] [facility] [employer]

## 2019-10-30 NOTE — Assessment & Plan Note (Signed)
Bilateral glenohumeral joint injections above, she also uses tramadol 1-2 times daily. Last injections were back in June. This is appropriate and we can continue this long-term, she is currently caring for her husband, stage III lung cancer but he is doing okay currently. She can return to see me on an as-needed basis.

## 2019-10-31 ENCOUNTER — Other Ambulatory Visit: Payer: Self-pay | Admitting: Osteopathic Medicine

## 2019-11-09 ENCOUNTER — Telehealth (INDEPENDENT_AMBULATORY_CARE_PROVIDER_SITE_OTHER): Payer: BC Managed Care – PPO | Admitting: Osteopathic Medicine

## 2019-11-09 ENCOUNTER — Encounter: Payer: Self-pay | Admitting: Osteopathic Medicine

## 2019-11-09 VITALS — Temp 97.7°F

## 2019-11-09 DIAGNOSIS — B9789 Other viral agents as the cause of diseases classified elsewhere: Secondary | ICD-10-CM | POA: Diagnosis not present

## 2019-11-09 DIAGNOSIS — J988 Other specified respiratory disorders: Secondary | ICD-10-CM

## 2019-11-09 DIAGNOSIS — J441 Chronic obstructive pulmonary disease with (acute) exacerbation: Secondary | ICD-10-CM

## 2019-11-09 MED ORDER — IPRATROPIUM BROMIDE 0.06 % NA SOLN: 2.0000 | Freq: Four times a day (QID) | NASAL | 1 refills | Status: DC

## 2019-11-09 MED ORDER — GUAIFENESIN-CODEINE 100-10 MG/5ML PO SYRP: 5.0000 mL | ORAL_SOLUTION | Freq: Three times a day (TID) | ORAL | 0 refills | Status: DC | PRN

## 2019-11-09 MED ORDER — PREDNISONE 20 MG PO TABS: 20.0000 mg | ORAL_TABLET | Freq: Two times a day (BID) | ORAL | 0 refills | Status: DC

## 2019-11-09 NOTE — Progress Notes (Signed)
Virtual Visit via Phone  I connected with      Sherri Gibson on 11/09/19 at 1:07 PM  by a telemedicine application and verified that I am speaking with the correct person using two identifiers.  Patient is at home I am in office   I discussed the limitations of evaluation and management by telemedicine and the availability of in person appointments. The patient expressed understanding and agreed to proceed.  History of Present Illness: Sherri Gibson is a 59 y.o. female who would like to discuss feeling stuffy head and nose. 59-year old nephews sick with runny noses and coughing. Head felt stuffy. Has been taking Tylenol and gargling salt water, but that's it. Has been feeling sick yesterday w/ sore throat. No fever. Mild coughing.  Concerned given her COPD history, she does not want anything to get worse.       Observations/Objective: Temp 97.7 F (36.5 C)  BP Readings from Last 3 Encounters:  09/04/19 117/80  04/28/19 115/70  03/31/19 120/78   Exam: Normal Speech.  NAD  Lab and Radiology Results No results found for this or any previous visit (from the past 72 hour(s)). No results found.     Assessment and Plan: 59 y.o. female with The primary encounter diagnosis was COPD with exacerbation ([place]). A diagnosis of Viral respiratory illness was also pertinent to this visit.  Encourage testing for Covid, otherwise would self isolate.  Sounds like probably caught common cold from one of her nephews, but better safe than sorry.  Treatment as below, I do not think antibiotics are warranted at this time, patient will let me know if anything changes or gets worse.  PDMP not reviewed this encounter. No orders of the defined types were placed in this encounter.  Meds ordered this encounter  Medications   predniSONE (DELTASONE) 20 MG tablet    Sig: Take 1 tablet (20 mg total) by mouth 2 (two) times daily with a meal.    Dispense:  10 tablet    Refill:  0    ipratropium (ATROVENT) 0.06 % nasal spray    Sig: Place 2 sprays into both nostrils 4 (four) times daily.    Dispense:  15 mL    Refill:  1   guaiFENesin-codeine (ROBITUSSIN AC) 100-10 MG/5ML syrup    Sig: Take 5-10 mLs by mouth 3 (three) times daily as needed for cough or congestion.    Dispense:  180 mL    Refill:  0   There are no Patient Instructions on file for this visit.     Follow Up Instructions: Return if symptoms worsen or fail to improve.    I discussed the assessment and treatment plan with the patient. The patient was provided an opportunity to ask questions and all were answered. The patient agreed with the plan and demonstrated an understanding of the instructions.   The patient was advised to call back or seek an in-person evaluation if any new concerns, if symptoms worsen or if the condition fails to improve as anticipated.  21 minutes of non-face-to-face time was provided during this encounter.      . . . . . . . . . . . . . Marland Kitchen                   Historical information moved to improve visibility of documentation.  Past Medical History:  Diagnosis Date   Breast cancer ([place]) 2016   Left    COPD (  chronic obstructive pulmonary disease) ([facility])    High blood pressure    High cholesterol    Past Surgical History:  Procedure Laterality Date   BREAST BIOPSY     BREAST LUMPECTOMY Left    BREAST LUMPECTOMY WITH AXILLARY LYMPH NODE BIOPSY  2016   PARTIAL HYSTERECTOMY     Social History   Tobacco Use   Smoking status: Former Smoker    Packs/day: 2.50    Years: 44.00    Pack years: 110.00    Types: Cigarettes    Quit date: 11/29/2016    Years since quitting: 2.9   Smokeless tobacco: Never Used  Substance Use Topics   Alcohol use: Not Currently   family history includes Breast cancer in her mother; Diabetes in her brother.  Medications: Current Outpatient Medications  Medication Sig Dispense Refill   albuterol  (PROAIR HFA) 108 (90 Base) MCG/ACT inhaler inhale 2 puff by inhalation route  every 4 - 6 hours as needed 25.5 g 1   albuterol (PROVENTIL) (2.5 MG/3ML) 0.083% nebulizer solution Take 3 mLs (2.5 mg total) by nebulization every 2 (two) hours as needed for wheezing or shortness of breath. 75 mL 99   amLODipine (NORVASC) 10 MG tablet TAKE 1 TABLET DAILY 90 tablet 3   aspirin 81 MG chewable tablet Chew by mouth.     atorvastatin (LIPITOR) 40 MG tablet TAKE 1 TABLET DAILY (DISCONTINUE PRAVASTATIN) 90 tablet 3   budesonide-formoterol (SYMBICORT) 160-4.5 MCG/ACT inhaler Inhale 2 puffs into the lungs 2 (two) times daily. 3 Inhaler 0   Calcium-Magnesium-Vitamin D (CALCIUM 1200+D3 PO) Take 1 capsule by mouth 2 (two) times daily.     cyclobenzaprine (FLEXERIL) 10 MG tablet TAKE 1 TABLET THREE TIMES A DAY AS NEEDED FOR MUSCLE SPASMS 90 tablet 1   losartan (COZAAR) 50 MG tablet TAKE 1 TABLET DAILY 90 tablet 3   metFORMIN (GLUCOPHAGE) 500 MG tablet TAKE 1 TABLET DAILY WITH BREAKFAST 90 tablet 1   MITIGARE 0.6 MG CAPS TAKE 1 CAPSULE DAILY 90 capsule 3   montelukast (SINGULAIR) 10 MG tablet TAKE 1 TABLET AT BEDTIME 90 tablet 3   Salicylic Acid (OCCLUSAL-HP EX) Apply topically.     Tiotropium Bromide Monohydrate (SPIRIVA RESPIMAT) 2.5 MCG/ACT AERS Inhale 2 puffs into the lungs daily. 12 g 3   traMADol (ULTRAM) 50 MG tablet Take 1-2 tablets (50-100 mg total) by mouth every 8 (eight) hours as needed for moderate pain. Maximum 6 tabs per day. 90 tablet 3   traZODone (DESYREL) 50 MG tablet TAKE 1 TABLET DAILY AT BEDTIME 90 tablet 3   betamethasone dipropionate (DIPROLENE) 0.05 % cream Apply topically 2 (two) times daily. To affected area(s) as needed (Patient not taking: Reported on 11/09/2019) 15 g 1   cimetidine (TAGAMET) 800 MG tablet Take 800 mg by mouth at bedtime.     guaiFENesin-codeine (ROBITUSSIN AC) 100-10 MG/5ML syrup Take 5-10 mLs by mouth 3 (three) times daily as needed for cough or  congestion. 180 mL 0   ipratropium (ATROVENT) 0.06 % nasal spray Place 2 sprays into both nostrils 4 (four) times daily. 15 mL 1   loratadine (CLARITIN) 10 MG tablet TAKE 1 TABLET BY MOUTH EVERY DAY (Patient not taking: Reported on 11/09/2019) 90 tablet 1   predniSONE (DELTASONE) 20 MG tablet Take 1 tablet (20 mg total) by mouth 2 (two) times daily with a meal. 10 tablet 0   No current facility-administered medications for this visit.   Allergies  Allergen Reactions   Codeine Itching  Feels like something crawling on her skin    Tetanus Toxoids Other (See Comments)    Patient stated,"I was given a Pneumonia shot at the same time I got the Tetanus shot, in the same arm."

## 2019-11-16 DIAGNOSIS — E119 Type 2 diabetes mellitus without complications: Secondary | ICD-10-CM | POA: Diagnosis not present

## 2019-11-16 DIAGNOSIS — M2012 Hallux valgus (acquired), left foot: Secondary | ICD-10-CM | POA: Diagnosis not present

## 2019-11-16 DIAGNOSIS — M2011 Hallux valgus (acquired), right foot: Secondary | ICD-10-CM | POA: Diagnosis not present

## 2019-11-16 DIAGNOSIS — B070 Plantar wart: Secondary | ICD-10-CM | POA: Diagnosis not present

## 2019-11-17 ENCOUNTER — Other Ambulatory Visit: Payer: Self-pay | Admitting: Pulmonary Disease

## 2019-11-17 DIAGNOSIS — R918 Other nonspecific abnormal finding of lung field: Secondary | ICD-10-CM

## 2019-11-17 DIAGNOSIS — J9611 Chronic respiratory failure with hypoxia: Secondary | ICD-10-CM

## 2019-11-17 DIAGNOSIS — J449 Chronic obstructive pulmonary disease, unspecified: Secondary | ICD-10-CM

## 2019-11-19 ENCOUNTER — Other Ambulatory Visit: Payer: Self-pay | Admitting: Sports Medicine

## 2019-11-19 DIAGNOSIS — M19012 Primary osteoarthritis, left shoulder: Secondary | ICD-10-CM

## 2019-11-19 DIAGNOSIS — M19011 Primary osteoarthritis, right shoulder: Secondary | ICD-10-CM

## 2019-11-26 DIAGNOSIS — J449 Chronic obstructive pulmonary disease, unspecified: Secondary | ICD-10-CM | POA: Diagnosis not present

## 2019-11-30 ENCOUNTER — Other Ambulatory Visit: Payer: Self-pay | Admitting: Osteopathic Medicine

## 2019-11-30 DIAGNOSIS — R7303 Prediabetes: Secondary | ICD-10-CM

## 2019-12-02 ENCOUNTER — Ambulatory Visit (INDEPENDENT_AMBULATORY_CARE_PROVIDER_SITE_OTHER): Payer: BC Managed Care – PPO | Admitting: Osteopathic Medicine

## 2019-12-02 ENCOUNTER — Other Ambulatory Visit: Payer: Self-pay

## 2019-12-02 ENCOUNTER — Encounter: Payer: Self-pay | Admitting: Osteopathic Medicine

## 2019-12-02 VITALS — BP 121/78 | HR 85 | Temp 98.4°F | Wt 125.1 lb

## 2019-12-02 DIAGNOSIS — I10 Essential (primary) hypertension: Secondary | ICD-10-CM

## 2019-12-02 DIAGNOSIS — R7303 Prediabetes: Secondary | ICD-10-CM

## 2019-12-02 DIAGNOSIS — J449 Chronic obstructive pulmonary disease, unspecified: Secondary | ICD-10-CM

## 2019-12-02 LAB — POCT GLYCOSYLATED HEMOGLOBIN (HGB A1C): Hemoglobin A1C: 6.7 % — AB (ref 4.0–5.6)

## 2019-12-02 NOTE — Progress Notes (Signed)
Sherri Gibson is a 59 y.o. female who presents to  [facility]  today, 12/02/19, seeking care for the following:  . Prediabetes - last A1C 6.3 in 03/2019, today 11/03/2 Is 6.7, room for improvement on diet/exercise especially since quitting smoking  . HTN - BP at goal, stable  . COPD - occasional SOB on exertion, uses home O2 sometimes, no productive coughing      ASSESSMENT & PLAN with other pertinent findings:  The primary encounter diagnosis was Prediabetes. Diagnoses of Essential hypertension and COPD, severe ([place]) were also pertinent to this visit.   1. Prediabetes Work on lower carb diet, increase exercise May need to increase metformin Recheck 3 mos instead of 6  Results for orders placed or performed in visit on 12/02/19 (from the past 24 hour(s))  POCT HgB A1C     Status: Abnormal   Collection Time: 12/02/19  8:26 AM  Result Value Ref Range   Hemoglobin A1C 6.7 (A) 4.0 - 5.6 %   HbA1c POC (<> result, manual entry)     HbA1c, POC (prediabetic range)     HbA1c, POC (controlled diabetic range)      2. Essential hypertension At goal today, no concerns  BP Readings from Last 3 Encounters:  12/02/19 121/78  09/04/19 117/80  04/28/19 115/70    3. COPD, severe ([place]) Increase use home O2 as needed Pt reports current inhalers are helping, she's using SABA more often as the weather gets colder, recheck next visit . Lungs CTABL today diminished breath sounds bilaterally stable       There are no Patient Instructions on file for this visit.  Orders Placed This Encounter  Procedures  . POCT HgB A1C    No orders of the defined types were placed in this encounter.      Follow-up instructions: Return in about 3 months (around 03/03/2020) for MONITOR A1C / COPD .                                         BP 121/78 (BP Location: Left Arm, Patient Position: Sitting, Cuff  Size: Normal)   Pulse 85   Temp 98.4 F (36.9 C) (Oral)   Wt 125 lb 1.9 oz (56.8 kg)   BMI 25.27 kg/m   Current Meds  Medication Sig  . albuterol (PROAIR HFA) 108 (90 Base) MCG/ACT inhaler inhale 2 puff by inhalation route  every 4 - 6 hours as needed  . albuterol (PROVENTIL) (2.5 MG/3ML) 0.083% nebulizer solution Take 3 mLs (2.5 mg total) by nebulization every 2 (two) hours as needed for wheezing or shortness of breath.  Marland Kitchen amLODipine (NORVASC) 10 MG tablet TAKE 1 TABLET DAILY  . aspirin 81 MG chewable tablet Chew by mouth.  Marland Kitchen atorvastatin (LIPITOR) 40 MG tablet TAKE 1 TABLET DAILY (DISCONTINUE PRAVASTATIN)  . betamethasone dipropionate (DIPROLENE) 0.05 % cream Apply topically 2 (two) times daily. To affected area(s) as needed  . Calcium-Magnesium-Vitamin D (CALCIUM 1200+D3 PO) Take 1 capsule by mouth 2 (two) times daily.  . cimetidine (TAGAMET) 800 MG tablet Take 800 mg by mouth at bedtime.  . cyclobenzaprine (FLEXERIL) 10 MG tablet TAKE 1 TABLET THREE TIMES A DAY AS NEEDED FOR MUSCLE SPASMS  . loratadine (CLARITIN) 10 MG tablet TAKE 1 TABLET BY MOUTH EVERY DAY  . losartan (COZAAR) 50 MG tablet TAKE 1  TABLET DAILY  . metFORMIN (GLUCOPHAGE) 500 MG tablet TAKE 1 TABLET DAILY WITH BREAKFAST  . MITIGARE 0.6 MG CAPS TAKE 1 CAPSULE DAILY  . montelukast (SINGULAIR) 10 MG tablet TAKE 1 TABLET AT BEDTIME  . predniSONE (DELTASONE) 20 MG tablet Take 1 tablet (20 mg total) by mouth 2 (two) times daily with a meal.  . Salicylic Acid (OCCLUSAL-HP EX) Apply topically.  . SYMBICORT 160-4.5 MCG/ACT inhaler USE 2 INHALATIONS TWICE A DAY  . Tiotropium Bromide Monohydrate (SPIRIVA RESPIMAT) 2.5 MCG/ACT AERS Inhale 2 puffs into the lungs daily.  . traMADol (ULTRAM) 50 MG tablet TAKE 1-2 TABLETS (50-100 MG TOTAL) BY MOUTH EVERY 8 (EIGHT) HOURS AS NEEDED FOR MODERATE PAIN. MAXIMUM 6 TABS PER DAY.  . traZODone (DESYREL) 50 MG tablet TAKE 1 TABLET DAILY AT BEDTIME    Results for orders placed or performed in  visit on 12/02/19 (from the past 72 hour(s))  POCT HgB A1C     Status: Abnormal   Collection Time: 12/02/19  8:26 AM  Result Value Ref Range   Hemoglobin A1C 6.7 (A) 4.0 - 5.6 %   HbA1c POC (<> result, manual entry)     HbA1c, POC (prediabetic range)     HbA1c, POC (controlled diabetic range)      No results found.     All questions at time of visit were answered - patient instructed to contact office with any additional concerns or updates.  ER/RTC precautions were reviewed with the patient as applicable.   Please note: voice recognition software was used to produce this document, and typos may escape review. Please contact Dr. Sheppard Coil for any needed clarifications.

## 2019-12-17 ENCOUNTER — Other Ambulatory Visit: Payer: Self-pay | Admitting: Family

## 2019-12-17 DIAGNOSIS — C50912 Malignant neoplasm of unspecified site of left female breast: Secondary | ICD-10-CM

## 2019-12-18 ENCOUNTER — Telehealth: Payer: Self-pay

## 2019-12-18 ENCOUNTER — Other Ambulatory Visit: Payer: BC Managed Care – PPO

## 2019-12-18 ENCOUNTER — Encounter: Payer: Self-pay | Admitting: Family

## 2019-12-18 ENCOUNTER — Other Ambulatory Visit: Payer: Self-pay

## 2019-12-18 ENCOUNTER — Inpatient Hospital Stay (HOSPITAL_BASED_OUTPATIENT_CLINIC_OR_DEPARTMENT_OTHER): Payer: BC Managed Care – PPO | Admitting: Family

## 2019-12-18 ENCOUNTER — Ambulatory Visit: Payer: BC Managed Care – PPO | Admitting: Hematology

## 2019-12-18 ENCOUNTER — Inpatient Hospital Stay: Payer: BC Managed Care – PPO | Attending: Hematology & Oncology

## 2019-12-18 VITALS — BP 121/84 | HR 83 | Temp 98.4°F | Resp 19 | Ht 59.0 in | Wt 124.0 lb

## 2019-12-18 DIAGNOSIS — Z79811 Long term (current) use of aromatase inhibitors: Secondary | ICD-10-CM | POA: Insufficient documentation

## 2019-12-18 DIAGNOSIS — C50912 Malignant neoplasm of unspecified site of left female breast: Secondary | ICD-10-CM

## 2019-12-18 DIAGNOSIS — Z923 Personal history of irradiation: Secondary | ICD-10-CM | POA: Diagnosis not present

## 2019-12-18 DIAGNOSIS — Z170 Estrogen receptor positive status [ER+]: Secondary | ICD-10-CM | POA: Diagnosis not present

## 2019-12-18 DIAGNOSIS — Z1211 Encounter for screening for malignant neoplasm of colon: Secondary | ICD-10-CM | POA: Diagnosis not present

## 2019-12-18 LAB — CMP (CANCER CENTER ONLY)
ALT: 13 U/L (ref 0–44)
AST: 16 U/L (ref 15–41)
Albumin: 4.2 g/dL (ref 3.5–5.0)
Alkaline Phosphatase: 79 U/L (ref 38–126)
Anion gap: 5 (ref 5–15)
BUN: 13 mg/dL (ref 6–20)
CO2: 32 mmol/L (ref 22–32)
Calcium: 10.1 mg/dL (ref 8.9–10.3)
Chloride: 100 mmol/L (ref 98–111)
Creatinine: 0.89 mg/dL (ref 0.44–1.00)
GFR, Estimated: >60 mL/min (ref >60)
Glucose, Bld: 135 mg/dL — ABNORMAL HIGH (ref 70–99)
Potassium: 4.4 mmol/L (ref 3.5–5.1)
Sodium: 137 mmol/L (ref 135–145)
Total Bilirubin: 0.4 mg/dL (ref 0.3–1.2)
Total Protein: 7 g/dL (ref 6.5–8.1)

## 2019-12-18 LAB — CBC WITH DIFFERENTIAL (CANCER CENTER ONLY)
Abs Immature Granulocytes: 0.03 10*3/uL (ref 0.00–0.07)
Basophils Absolute: 0 10*3/uL (ref 0.0–0.1)
Basophils Relative: 0 %
Eosinophils Absolute: 0.3 10*3/uL (ref 0.0–0.5)
Eosinophils Relative: 4 %
HCT: 41 % (ref 36.0–46.0)
Hemoglobin: 13 g/dL (ref 12.0–15.0)
Immature Granulocytes: 0 %
Lymphocytes Relative: 14 %
Lymphs Abs: 1.2 10*3/uL (ref 0.7–4.0)
MCH: 28.4 pg (ref 26.0–34.0)
MCHC: 31.7 g/dL (ref 30.0–36.0)
MCV: 89.7 fL (ref 80.0–100.0)
Monocytes Absolute: 0.7 10*3/uL (ref 0.1–1.0)
Monocytes Relative: 9 %
Neutro Abs: 6 10*3/uL (ref 1.7–7.7)
Neutrophils Relative %: 73 %
Platelet Count: 328 10*3/uL (ref 150–400)
RBC: 4.57 MIL/uL (ref 3.87–5.11)
RDW: 13.2 % (ref 11.5–15.5)
WBC Count: 8.2 10*3/uL (ref 4.0–10.5)
nRBC: 0 % (ref 0.0–0.2)

## 2019-12-18 NOTE — Progress Notes (Signed)
Hematology and Oncology Follow Up Visit  Sherri BRITTINGHAM [id number] 25-Jun-1960 59 y.o. 12/18/2019   Principle Diagnosis:  1. 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 here today for follow-up. She is doing well and has no complaints at this time.  She has been doing self breast exams regular and has not noted any changes.  She has the occasional hot flash on Arimidex.  Bilateral breast exam today was negative. No mass, lesion or rash noted. Lumpectomy scars well healed and intact.  No adenopathy noted. Mammogram in March 2021 was negative. She will be due again in 2022.  She is staying busy helping care for her husband who has lung cancer.  She states that she never heard from GI to establish care and colonoscopy (history of abnormal colonoscopy) and plans to follow-up for another referral. Looks like Dr. ZhMaylon Pepperslaced this so we will resend today.  She denies fever, chills, n/v, cough, rash, dizziness, chest pain, palpitations, abdominal pain or changes in bowel or bladder habits.  She has mild SOB at times due to COPD.  She has constipation and will take a laxative as needed.  She grazes throughout the day and does not eat much. She states that she also does not hydrate well.  No swelling, numbness or tingling in her extremities.  No falls or syncope.  She has now noted any blood loss. No bruising or petechiae.   ECOG Performance Status: 0 - Asymptomatic  Medications:  Allergies as of 12/18/2019      Reactions   Codeine Itching   Feels like something crawling on her skin   Tetanus Toxoids Other (See Comments)   Patient  stated,"I was given a Pneumonia shot at the same time I got the Tetanus shot, in the same arm."      Medication List       Accurate as of December 18, 2019 10:14 AM. If you have any questions, ask your nurse or doctor.        albuterol (2.5 MG/3ML) 0.083% nebulizer solution Commonly known as: PROVENTIL Take 3 mLs (2.5 mg total) by nebulization every 2 (two) hours as needed for wheezing or shortness of breath.   albuterol 108 (90 Base) MCG/ACT inhaler Commonly known as: ProAir HFA inhale 2 puff by inhalation route  every 4 - 6 hours as needed   amLODipine 10 MG tablet Commonly known as: NORVASC TAKE 1 TABLET DAILY   aspirin 81 MG chewable tablet Chew by mouth.   atorvastatin 40 MG tablet Commonly known as: LIPITOR TAKE 1 TABLET DAILY (DISCONTINUE PRAVASTATIN)   betamethasone dipropionate 0.05 % cream Apply topically 2 (two) times daily. To affected area(s) as needed   CALCIUM 1200+D3 PO Take 1 capsule by mouth 2 (two) times daily.   cimetidine 800 MG tablet Commonly known as: TAGAMET Take 800 mg by mouth at bedtime.   cyclobenzaprine 10 MG tablet Commonly known as: FLEXERIL TAKE 1 TABLET THREE TIMES A DAY AS NEEDED FOR MUSCLE SPASMS   guaiFENesin-codeine 100-10 MG/5ML syrup Commonly known as: ROBITUSSIN AC Take 5-10 mLs by mouth 3 (three) times daily as needed for  cough or congestion.   ipratropium 0.06 % nasal spray Commonly known as: ATROVENT Place 2 sprays into both nostrils 4 (four) times daily.   loratadine 10 MG tablet Commonly known as: CLARITIN TAKE 1 TABLET BY MOUTH EVERY DAY   losartan 50 MG tablet Commonly known as: COZAAR TAKE 1 TABLET DAILY   metFORMIN 500 MG tablet Commonly known as: GLUCOPHAGE TAKE 1 TABLET DAILY WITH BREAKFAST   Mitigare 0.6 MG Caps Generic drug: Colchicine TAKE 1 CAPSULE DAILY   montelukast 10 MG tablet Commonly known as: SINGULAIR TAKE 1 TABLET AT BEDTIME   OCCLUSAL-HP EX Apply topically.   predniSONE 20 MG  tablet Commonly known as: DELTASONE Take 1 tablet (20 mg total) by mouth 2 (two) times daily with a meal.   Spiriva Respimat 2.5 MCG/ACT Aers Generic drug: Tiotropium Bromide Monohydrate Inhale 2 puffs into the lungs daily.   Symbicort 160-4.5 MCG/ACT inhaler Generic drug: budesonide-formoterol USE 2 INHALATIONS TWICE A DAY   traMADol 50 MG tablet Commonly known as: ULTRAM TAKE 1-2 TABLETS (50-100 MG TOTAL) BY MOUTH EVERY 8 (EIGHT) HOURS AS NEEDED FOR MODERATE PAIN. MAXIMUM 6 TABS PER DAY.   traZODone 50 MG tablet Commonly known as: DESYREL TAKE 1 TABLET DAILY AT BEDTIME       Allergies:  Allergies  Allergen Reactions  . Codeine Itching    Feels like something crawling on her skin   . Tetanus Toxoids Other (See Comments)    Patient stated,"I was given a Pneumonia shot at the same time I got the Tetanus shot, in the same arm."    Past Medical History, Surgical history, Social history, and Family History were reviewed and updated.  Review of Systems: All other 10 point review of systems is negative.   Physical Exam:  vitals were not taken for this visit.   Wt Readings from Last 3 Encounters:  12/02/19 125 lb 1.9 oz (56.8 kg)  07/10/19 131 lb (59.4 kg)  04/28/19 134 lb (60.8 kg)    Ocular: Sclerae unicteric, pupils equal, round and reactive to light Ear-nose-throat: Oropharynx clear, dentition fair Lymphatic: No cervical, supraclavicular or axillary adenopathy Lungs no rales or rhonchi, good excursion bilaterally Heart regular rate and rhythm, no murmur appreciated Abd soft, nontender, positive bowel sounds, no liver or spleen tip palpated on exam, no fluid wave  MSK no focal spinal tenderness, no joint edema Neuro: non-focal, well-oriented, appropriate affect Breasts: No mass, lesion or rash noted on bilateral breast exam. Lumpectomy scars well healed and intact.   Lab Results  Component Value Date   WBC 8.2 12/18/2019   HGB 13.0 12/18/2019   HCT 41.0  12/18/2019   MCV 89.7 12/18/2019   PLT 328 12/18/2019   No results found for: FERRITIN, IRON, TIBC, UIBC, IRONPCTSAT Lab Results  Component Value Date   RBC 4.57 12/18/2019   No results found for: KPAFRELGTCHN, LAMBDASER, KAPLAMBRATIO No results found for: IGGSERUM, IGA, IGMSERUM No results found for: Odetta Pink, SPEI   Chemistry      Component Value Date/Time   NA 137 12/18/2019 0917   NA 139 10/09/2017 0000   K 4.4 12/18/2019 0917   CL 100 12/18/2019 0917   CL 103 10/09/2017 0000   CO2 32 12/18/2019 0917   CO2 22 10/09/2017 0000   BUN 13 12/18/2019 0917   BUN 17 10/09/2017 0000   CREATININE 0.89 12/18/2019 0917   CREATININE 0.83 05/18/2019 0815   GLU 119 10/09/2017 0000  Component Value Date/Time   CALCIUM 10.1 12/18/2019 0917   CALCIUM 9.2 10/09/2017 0000   ALKPHOS 79 12/18/2019 0917   AST 16 12/18/2019 0917   ALT 13 12/18/2019 0917   BILITOT 0.4 12/18/2019 0917       Impression and Plan: Ms. Stith is a very pleasant 59 yo caucasian female with history of 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 She started Arimidex in October 2016 and will finish out her 5 years in December (she plans to finish out the prescription she currently has).  She continues to do well and so far there has been no evidence of recurrence.  She is due again for mammogram in March 2022.  Looks like Dr. Maylon Peppers placed referral for GI so I will re-enter referral today.  Follow-up in 1 year.  She was encouraged to contact our office with any questions or concerns.   Laverna Peace, NP 11/19/202110:14 AM

## 2019-12-18 NOTE — Telephone Encounter (Signed)
S/w pt per 12/18/19 and she is aware of her f.u appt   AOM

## 2019-12-27 DIAGNOSIS — J449 Chronic obstructive pulmonary disease, unspecified: Secondary | ICD-10-CM | POA: Diagnosis not present

## 2020-01-04 DIAGNOSIS — E119 Type 2 diabetes mellitus without complications: Secondary | ICD-10-CM | POA: Diagnosis not present

## 2020-01-04 DIAGNOSIS — B070 Plantar wart: Secondary | ICD-10-CM | POA: Diagnosis not present

## 2020-01-04 DIAGNOSIS — M2011 Hallux valgus (acquired), right foot: Secondary | ICD-10-CM | POA: Diagnosis not present

## 2020-01-04 DIAGNOSIS — M2012 Hallux valgus (acquired), left foot: Secondary | ICD-10-CM | POA: Diagnosis not present

## 2020-01-05 ENCOUNTER — Ambulatory Visit: Payer: Self-pay

## 2020-01-05 ENCOUNTER — Encounter: Payer: Self-pay | Admitting: Orthopaedic Surgery

## 2020-01-05 ENCOUNTER — Ambulatory Visit: Payer: BC Managed Care – PPO | Admitting: Orthopaedic Surgery

## 2020-01-05 ENCOUNTER — Other Ambulatory Visit: Payer: Self-pay

## 2020-01-05 VITALS — Ht 59.0 in | Wt 122.0 lb

## 2020-01-05 DIAGNOSIS — M19012 Primary osteoarthritis, left shoulder: Secondary | ICD-10-CM

## 2020-01-05 DIAGNOSIS — M19011 Primary osteoarthritis, right shoulder: Secondary | ICD-10-CM

## 2020-01-05 NOTE — Progress Notes (Signed)
Office Visit Note   Patient: Sherri Gibson           Date of Birth: 02/28/1960           MRN: [id number] Visit Date: 01/05/2020              Requested by: Emeterio Reeve, [address] [place],  [place] [address] PCP: Emeterio Reeve, DO   Assessment & Plan: Visit Diagnoses:  1. Primary osteoarthritis of both shoulders     Plan: Impression is advanced degenerative joint disease both shoulders left greater than right.  At this point, we will refer the patient to Dr. Marlou Sa for further evaluation and treatment recommendation.  She will follow up with [name] as needed.  Follow-Up Instructions: Return for with Dr. Marlou Sa.   Orders:  Orders Placed This Encounter  Procedures  . XR Shoulder Left   No orders of the defined types were placed in this encounter.     Procedures: No procedures performed   Clinical Data: No additional findings.   Subjective: Chief Complaint  Patient presents with  . Right Shoulder - Pain  . Left Shoulder - Pain    HPI patient is a 59 year old female who comes in today with bilateral shoulder pain left greater than right for the past 6 months.  No known injury or change in activity but she does note her symptoms have progressively worsened.  Pain she has on the left is to the shoulder itself.  The right shoulder has radiation of pain into the deltoid.  She has increased symptoms when she is trying to put on her seatbelt or closing the door.  She also has pain when she is forward flexing either arm.  She has been taking tramadol which seems to mildly improve her symptoms.  She does note occasional tingling to thumbs.  She has had previous cortisone injections to her shoulders by a sports medicine doctor at her primary care's office.  She has not had an MRI of your shoulder.  Review of Systems as detailed in HPI.  All others reviewed and are negative.   Objective: Vital Signs: Ht 4\' 11"  (1.499 m)   Wt 122 lb (55.3 kg)   BMI  24.64 kg/m   Physical Exam well-developed well-nourished female no acute distress.  Alert and oriented x3.  Ortho Exam left shoulder exam shows very limited range of motion in all planes.  She does have weakness throughout.  Right shoulder exam shows forward flexion to about 90 degrees.  She can internally rotate to her back pocket.  3 out of 5 strength throughout.  She is neurovascular intact distally.  Specialty Comments:  No specialty comments available.  Imaging: XR Shoulder Left  Result Date: 01/05/2020 X-rays demonstrate marked glenohumeral degenerative changes    PMFS History: Patient Active Problem List   Diagnosis Date Noted  . Encounter for screening for lung cancer 04/13/2019  . Primary osteoarthritis of both shoulders 03/06/2019  . Chronic hypoxemic respiratory failure ([place]) 12/22/2018  . Osteopenia 12/20/2017  . Prediabetes 09/23/2017  . Colonoscopy refused 05/29/2017  . COPD, severe ([place]) 05/29/2017  . Multiple pulmonary nodules 05/29/2017  . Trigger thumb, right thumb 05/29/2017  . Primary osteoarthritis of first carpometacarpal joint of left hand 05/29/2017  . Kidney lesion 12/14/2016  . Erythrocytosis 11/13/2016  . Breast cancer ([place]) 09/20/2016  . Essential hypertension 09/20/2016  . Insomnia 09/20/2016  . Mixed hyperlipidemia 09/20/2016   Past Medical History:  Diagnosis Date  .  Breast cancer ([place]) 2016   Left   . COPD (chronic obstructive pulmonary disease) ([place])   . High blood pressure   . High cholesterol     Family History  Problem Relation Age of Onset  . Breast cancer Mother   . Diabetes Brother     Past Surgical History:  Procedure Laterality Date  . BREAST BIOPSY    . BREAST LUMPECTOMY Left   . BREAST LUMPECTOMY WITH AXILLARY LYMPH NODE BIOPSY  2016  . PARTIAL HYSTERECTOMY     Social History   Occupational History  . Not on file  Tobacco Use  . Smoking status: Former Smoker    Packs/day: 2.50    Years: 44.00    Pack years:  110.00    Types: Cigarettes    Quit date: 11/29/2016    Years since quitting: 3.1  . Smokeless tobacco: Never Used  Vaping Use  . Vaping Use: Never used  Substance and Sexual Activity  . Alcohol use: Not Currently  . Drug use: Never  . Sexual activity: Yes    Partners: Male    Birth control/protection: None

## 2020-01-06 ENCOUNTER — Encounter: Payer: Self-pay | Admitting: Gastroenterology

## 2020-01-15 ENCOUNTER — Ambulatory Visit: Payer: BC Managed Care – PPO | Admitting: Orthopedic Surgery

## 2020-01-15 ENCOUNTER — Other Ambulatory Visit: Payer: Self-pay

## 2020-01-15 DIAGNOSIS — M19012 Primary osteoarthritis, left shoulder: Secondary | ICD-10-CM | POA: Diagnosis not present

## 2020-01-15 DIAGNOSIS — M19011 Primary osteoarthritis, right shoulder: Secondary | ICD-10-CM | POA: Diagnosis not present

## 2020-01-16 ENCOUNTER — Encounter: Payer: Self-pay | Admitting: Orthopedic Surgery

## 2020-01-16 NOTE — Progress Notes (Signed)
Office Visit Note   Patient: Sherri Gibson           Date of Birth: [birth year]           MRN: [id number] Visit Date: 01/15/2020 Requested by: Emeterio Reeve, [address] [place],  [place] [address] PCP: Emeterio Reeve, DO  Subjective: Chief Complaint  Patient presents with  . Left Shoulder - Pain  . Right Shoulder - Pain    HPI: Sherri Gibson is a 59 y.o. female who presents to the office complaining of bilateral shoulder pain. Patient has a history of bilateral shoulder pain, left greater than right. She had some soreness for several years but she states that she has only "had pain for about 6 months". No injury associated with onset of pain or worsening pain. Pain is waking her up at night. She takes tramadol twice per day and trazodone to help her sleep at night. She has occasional radiation of pain from the shoulder into her bicep on the left side. No radiation into her fingertips. No significant numbness/tingling. She does have diagnosed advanced glenohumeral osteoarthritis on radiographs and is referred by Dr. Erlinda Hong. She notes her mother had a shoulder replacement. She does have a history of breast cancer with lymph node excision on the knee left side and COPD for which she takes oxygen at night. She enjoys gardening. She had bilateral shoulder injection in October 2021 that only helped make the pain more bearable but did not provide significant relief..                ROS: All systems reviewed are negative as they relate to the chief complaint within the history of present illness.  Patient denies fevers or chills.  Assessment & Plan: Visit Diagnoses:  1. Primary osteoarthritis of both shoulders     Plan: Patient is a 59 year old female who presents for evaluation of bilateral shoulder pain. She has history of bilateral shoulder osteoarthritis with significantly worse osteoarthritis of the left shoulder with complete loss of joint space and B2 glenoid  deformity. She has had worsening pain over the last 6 months and injections are not providing any lasting relief. She would like to discuss surgery. Her mom has had shoulder placement and patient is interested in pursuing replacement. Risks and benefits of the procedure were discussed including the risk of nerve and vessel damage, shoulder stiffness, shoulder instability, prosthetic joint infection. Recovery timeline was discussed as well. She does have very mild weakness of the subscapularis on exam as well as difficulty lifting the arm past 40 to 50 degrees of forward flexion despite having about 90 degrees of passive forward flexion. Well-preserved strength of the supraspinatus and infraspinatus of the left shoulder on exam today. Plan to order thin cut CT scan for preoperative planning purposes.  We will need to review that scan carefully to assess rotator cuff integrity before deciding definitively on anatomic shoulder replacement.  Follow-up after CT scan to review results.  Follow-Up Instructions: No follow-ups on file.   Orders:  Orders Placed This Encounter  Procedures  . CT SHOULDER LEFT WO CONTRAST   No orders of the defined types were placed in this encounter.     Procedures: No procedures performed   Clinical Data: No additional findings.  Objective: Vital Signs: There were no vitals taken for this visit.  Physical Exam:  Constitutional: Patient appears well-developed HEENT:  Head: Normocephalic Eyes:EOM are normal Neck: Normal range of motion Cardiovascular: Normal rate  Pulmonary/chest: Effort normal Neurologic: Patient is alert Skin: Skin is warm Psychiatric: Patient has normal mood and affect  Ortho Exam: Ortho exam demonstrates left shoulder with 30 degrees external rotation, 50 degrees abduction, 95 degrees forward flexion compared with 45 degrees external rotation, 75 degrees abduction, 125 degrees forward flexion of the right shoulder. Left shoulder with subscap  weakness with resisted strength testing. Supraspinatus and infraspinatus with 5 -/5 strength. Active forward flexion of the left shoulder to 40 degrees. No difficulty lifting the right arm. Axillary nerve functioning well with deltoid firing on the left and right shoulders.5/5 motor strength of the bilateral grip strength, finger abduction, pronation/supination, bicep, tricep, deltoid.  Specialty Comments:  No specialty comments available.  Imaging: No results found.   PMFS History: Patient Active Problem List   Diagnosis Date Noted  . Encounter for screening for lung cancer 04/13/2019  . Primary osteoarthritis of both shoulders 03/06/2019  . Chronic hypoxemic respiratory failure ([place]) 12/22/2018  . Osteopenia 12/20/2017  . Prediabetes 09/23/2017  . Colonoscopy refused 05/29/2017  . COPD, severe ([place]) 05/29/2017  . Multiple pulmonary nodules 05/29/2017  . Trigger thumb, right thumb 05/29/2017  . Primary osteoarthritis of first carpometacarpal joint of left hand 05/29/2017  . Kidney lesion 12/14/2016  . Erythrocytosis 11/13/2016  . Breast cancer ([place]) 09/20/2016  . Essential hypertension 09/20/2016  . Insomnia 09/20/2016  . Mixed hyperlipidemia 09/20/2016   Past Medical History:  Diagnosis Date  . Breast cancer ([place]) 2016   Left   . COPD (chronic obstructive pulmonary disease) ([place])   . High blood pressure   . High cholesterol     Family History  Problem Relation Age of Onset  . Breast cancer Mother   . Diabetes Brother     Past Surgical History:  Procedure Laterality Date  . BREAST BIOPSY    . BREAST LUMPECTOMY Left   . BREAST LUMPECTOMY WITH AXILLARY LYMPH NODE BIOPSY  2016  . PARTIAL HYSTERECTOMY     Social History   Occupational History  . Not on file  Tobacco Use  . Smoking status: Former Smoker    Packs/day: 2.50    Years: 44.00    Pack years: 110.00    Types: Cigarettes    Quit date: 11/29/2016    Years since quitting: 3.1  . Smokeless tobacco: Never  Used  Vaping Use  . Vaping Use: Never used  Substance and Sexual Activity  . Alcohol use: Not Currently  . Drug use: Never  . Sexual activity: Yes    Partners: Male    Birth control/protection: None

## 2020-01-17 ENCOUNTER — Encounter: Payer: Self-pay | Admitting: Orthopedic Surgery

## 2020-01-19 ENCOUNTER — Ambulatory Visit (INDEPENDENT_AMBULATORY_CARE_PROVIDER_SITE_OTHER): Payer: BC Managed Care – PPO

## 2020-01-19 ENCOUNTER — Other Ambulatory Visit: Payer: Self-pay

## 2020-01-19 DIAGNOSIS — M87022 Idiopathic aseptic necrosis of left humerus: Secondary | ICD-10-CM | POA: Diagnosis not present

## 2020-01-19 DIAGNOSIS — M19012 Primary osteoarthritis, left shoulder: Secondary | ICD-10-CM

## 2020-01-19 DIAGNOSIS — I700 Atherosclerosis of aorta: Secondary | ICD-10-CM | POA: Diagnosis not present

## 2020-01-19 DIAGNOSIS — Z01818 Encounter for other preprocedural examination: Secondary | ICD-10-CM | POA: Diagnosis not present

## 2020-01-19 DIAGNOSIS — M19011 Primary osteoarthritis, right shoulder: Secondary | ICD-10-CM

## 2020-02-02 NOTE — Progress Notes (Signed)
Blue sheet done

## 2020-02-03 ENCOUNTER — Ambulatory Visit: Payer: BC Managed Care – PPO | Admitting: Gastroenterology

## 2020-02-03 ENCOUNTER — Telehealth: Payer: Self-pay | Admitting: General Surgery

## 2020-02-03 ENCOUNTER — Encounter: Payer: Self-pay | Admitting: Gastroenterology

## 2020-02-03 VITALS — BP 100/68 | HR 95 | Ht 59.0 in | Wt 121.1 lb

## 2020-02-03 DIAGNOSIS — Z1211 Encounter for screening for malignant neoplasm of colon: Secondary | ICD-10-CM | POA: Diagnosis not present

## 2020-02-03 DIAGNOSIS — J449 Chronic obstructive pulmonary disease, unspecified: Secondary | ICD-10-CM | POA: Diagnosis not present

## 2020-02-03 DIAGNOSIS — R1310 Dysphagia, unspecified: Secondary | ICD-10-CM | POA: Diagnosis not present

## 2020-02-03 DIAGNOSIS — Z9981 Dependence on supplemental oxygen: Secondary | ICD-10-CM | POA: Diagnosis not present

## 2020-02-03 DIAGNOSIS — K5909 Other constipation: Secondary | ICD-10-CM

## 2020-02-03 MED ORDER — ONDANSETRON HCL 4 MG PO TABS: 4.0000 mg | ORAL_TABLET | Freq: Four times a day (QID) | ORAL | 0 refills | Status: DC | PRN

## 2020-02-03 MED ORDER — SUTAB 1479-225-188 MG PO TABS: 1.0000 | ORAL_TABLET | Freq: Once | ORAL | 0 refills | Status: AC

## 2020-02-03 NOTE — Progress Notes (Signed)
Chief Complaint: Colon Cancer Screening  Referring Provider:     [place], Holli Humbles, NP   HPI:     Sherri Gibson is a 60 y.o. female w/ a hx of Stage I left Breast CA dx-ed 2016 s/p lumpectomy with adjuvant RT and Arimidex currently, COPD (requires nocturnal home O2), HTN, HLD, IFG, referred to the [facility] for evaluation of CRC screening.   Only prior colonoscopy was ~2017 in [place], [place] at [facility] and n/f polyps and inadequate prep per patient, with recommendation for 1 year repeat, but she subsequently moved to [facility].   Does have a history of constipation, well controlled with laxative prn and when she increases water intake. Has recenlty incrased her water intake with improvement. Otherwise, no hematochezia, melena.   Separately, has had pill dysphagia over the last few months, pointing to anterior neck. No issue with liquids. No HB, regurgitation. No prior EGD.   No known family history of CRC, GI malignancy, liver disease, pancreatic disease, or IBD.    CBC    Component Value Date/Time   WBC 8.2 12/18/2019 0917   WBC 7.5 07/30/2018 0933   RBC 4.57 12/18/2019 0917   HGB 13.0 12/18/2019 0917   HCT 41.0 12/18/2019 0917   PLT 328 12/18/2019 0917   MCV 89.7 12/18/2019 0917   MCH 28.4 12/18/2019 0917   MCHC 31.7 12/18/2019 0917   RDW 13.2 12/18/2019 0917   LYMPHSABS 1.2 12/18/2019 0917   MONOABS 0.7 12/18/2019 0917   EOSABS 0.3 12/18/2019 0917   BASOSABS 0.0 12/18/2019 0917   CMP Latest Ref Rng & Units 12/18/2019 05/18/2019 12/19/2018  Glucose 70 - 99 mg/dL 135(H) 85 206(H)  BUN 6 - 20 mg/dL 13 16 17   Creatinine 0.44 - 1.00 mg/dL 0.89 0.83 0.98  Sodium 135 - 145 mmol/L 137 139 139  Potassium 3.5 - 5.1 mmol/L 4.4 4.9 4.5  Chloride 98 - 111 mmol/L 100 102 101  CO2 22 - 32 mmol/L 32 27 30  Calcium 8.9 - 10.3 mg/dL 10.1 9.9 10.1  Total Protein 6.5 - 8.1 g/dL 7.0 7.1 7.6  Total Bilirubin 0.3 - 1.2 mg/dL 0.4 0.5 0.5  Alkaline Phos 38  - 126 U/L 79 - 63  AST 15 - 41 U/L 16 28 21   ALT 0 - 44 U/L 13 21 23      Past Medical History:  Diagnosis Date  . Arthritis    Both Shoulders  . Breast cancer ([place]) 2016   Left   . COPD (chronic obstructive pulmonary disease) ([place])   . High blood pressure   . High cholesterol   . Prediabetes      Past Surgical History:  Procedure Laterality Date  . BREAST BIOPSY    . BREAST LUMPECTOMY Left   . BREAST LUMPECTOMY WITH AXILLARY LYMPH NODE BIOPSY  2016  . COLONOSCOPY  2017   [facility]. think there was colon polyps  . PARTIAL HYSTERECTOMY     Family History  Problem Relation Age of Onset  . Breast cancer Mother   . Diabetes Brother   . Colon cancer Neg Hx   . Esophageal cancer Neg Hx    Social History   Tobacco Use  . Smoking status: Former Smoker    Packs/day: 2.50    Years: 44.00    Pack years: 110.00    Types: Cigarettes    Quit date: 11/29/2016  Years since quitting: 3.1  . Smokeless tobacco: Never Used  Vaping Use  . Vaping Use: Never used  Substance Use Topics  . Alcohol use: Not Currently  . Drug use: Never   Current Outpatient Medications  Medication Sig Dispense Refill  . albuterol (PROAIR HFA) 108 (90 Base) MCG/ACT inhaler inhale 2 puff by inhalation route  every 4 - 6 hours as needed 25.5 g 1  . albuterol (PROVENTIL) (2.5 MG/3ML) 0.083% nebulizer solution Take 3 mLs (2.5 mg total) by nebulization every 2 (two) hours as needed for wheezing or shortness of breath. 75 mL 99  . amLODipine (NORVASC) 10 MG tablet TAKE 1 TABLET DAILY 90 tablet 3  . anastrozole (ARIMIDEX) 1 MG tablet Take 1 mg by mouth daily.    Marland Kitchen aspirin 81 MG chewable tablet Chew by mouth.    Marland Kitchen atorvastatin (LIPITOR) 40 MG tablet TAKE 1 TABLET DAILY (DISCONTINUE PRAVASTATIN) 90 tablet 3  . betamethasone dipropionate (DIPROLENE) 0.05 % cream Apply topically 2 (two) times daily. To affected area(s) as needed 15 g 1  . Calcium-Magnesium-Vitamin D  (CALCIUM 1200+D3 PO) Take 1 capsule by mouth 2 (two) times daily.    . cimetidine (TAGAMET) 800 MG tablet Take 800 mg by mouth at bedtime.    . cyclobenzaprine (FLEXERIL) 10 MG tablet TAKE 1 TABLET THREE TIMES A DAY AS NEEDED FOR MUSCLE SPASMS 90 tablet 1  . guaiFENesin-codeine (ROBITUSSIN AC) 100-10 MG/5ML syrup Take 5-10 mLs by mouth 3 (three) times daily as needed for cough or congestion. 180 mL 0  . ipratropium (ATROVENT) 0.06 % nasal spray Place 2 sprays into both nostrils 4 (four) times daily. 15 mL 1  . loratadine (CLARITIN) 10 MG tablet TAKE 1 TABLET BY MOUTH EVERY DAY 90 tablet 1  . losartan (COZAAR) 50 MG tablet TAKE 1 TABLET DAILY 90 tablet 3  . metFORMIN (GLUCOPHAGE) 500 MG tablet TAKE 1 TABLET DAILY WITH BREAKFAST 90 tablet 0  . MITIGARE 0.6 MG CAPS TAKE 1 CAPSULE DAILY 90 capsule 3  . montelukast (SINGULAIR) 10 MG tablet TAKE 1 TABLET AT BEDTIME 90 tablet 3  . predniSONE (DELTASONE) 20 MG tablet Take 1 tablet (20 mg total) by mouth 2 (two) times daily with a meal. 10 tablet 0  . Salicylic Acid (OCCLUSAL-HP EX) Apply topically.    . SYMBICORT 160-4.5 MCG/ACT inhaler USE 2 INHALATIONS TWICE A DAY 30.6 g 3  . Tiotropium Bromide Monohydrate (SPIRIVA RESPIMAT) 2.5 MCG/ACT AERS Inhale 2 puffs into the lungs daily. 12 g 3  . traMADol (ULTRAM) 50 MG tablet TAKE 1-2 TABLETS (50-100 MG TOTAL) BY MOUTH EVERY 8 (EIGHT) HOURS AS NEEDED FOR MODERATE PAIN. MAXIMUM 6 TABS PER DAY. 90 tablet 3  . traZODone (DESYREL) 50 MG tablet TAKE 1 TABLET DAILY AT BEDTIME 90 tablet 3   No current facility-administered medications for this visit.   Allergies  Allergen Reactions  . Codeine Itching    Feels like something crawling on her skin   . Tetanus Toxoids Other (See Comments)    Patient stated,"I was given a Pneumonia shot at the same time I got the Tetanus shot, in the same arm."     Review of Systems: All systems reviewed and negative except where noted in HPI.     Physical Exam:    Wt  Readings from Last 3 Encounters:  02/03/20 121 lb 2 oz (54.9 kg)  01/05/20 122 lb (55.3 kg)  12/18/19 124 lb (56.2 kg)    BP 100/68   Pulse  95   Ht 4\' 11"  (1.499 m)   Wt 121 lb 2 oz (54.9 kg)   BMI 24.46 kg/m  Constitutional:  Pleasant, in no acute distress. Psychiatric: Normal mood and affect. Behavior is normal. EENT: Pupils normal.  Conjunctivae are normal. No scleral icterus. Neck supple. No cervical LAD. Cardiovascular: Normal rate, regular rhythm. No edema Pulmonary/chest: Effort normal and breath sounds normal. No wheezing, rales or rhonchi. Abdominal: Soft, nondistended, nontender. Bowel sounds active throughout. There are no masses palpable. No hepatomegaly. Neurological: Alert and oriented to person place and time. Skin: Skin is warm and dry. No rashes noted.   ASSESSMENT AND PLAN;   1) Colon cancer screening -Plan for repeat colonoscopy due to previous inadequate prep and prior recommendation for short interval repeat -Plan for CTAB prep along with Zofran as needed during bowel prep  2) Dysphagia -Recent onset dysphagia with pills.  No concomitant reflux symptoms -EGD with dilation along with esophageal biopsies as appropriate  3) Chronic constipation -Well-controlled with adequate water intake and intermittent use of laxatives on demand -Continue increasing daily fluids -Continue high-fiber diet -Can certainly evaluate for mucosal/luminal pathology at time of colonoscopy as above  4) COPD -Procedures to be [facility] at [facility] due to home O2 requirement and elevated periprocedural risks  The indications, risks, and benefits of EGD and colonoscopy were explained to the patient in detail. Risks include but are not limited to bleeding, perforation, adverse reaction to medications, and cardiopulmonary compromise. Sequelae include but are not limited to the possibility of surgery, hositalization, and mortality. The patient verbalized understanding and wished to proceed.  All questions answered, referred to scheduler and bowel prep ordered. Further recommendations pending results of the exam.     [facility], DO, FACG  02/03/2020, 11:08 AM   [place], 04/02/2020, NP

## 2020-02-03 NOTE — Patient Instructions (Addendum)
If you are age 60 or older, your body mass index should be between 23-30. Your Body mass index is 24.46 kg/m. If this is out of the aforementioned range listed, please consider follow up with your Primary Care Provider.  If you are age 76 or younger, your body mass index should be between 19-25. Your Body mass index is 24.46 kg/m. If this is out of the aformentioned range listed, please consider follow up with your Primary Care Provider.   We have sent the following medications to your pharmacy for you to pick up at your convenience:  Sutab Ondansetron  Due to recent changes in healthcare laws, you may see the results of your imaging and laboratory studies on MyChart before your provider has had a chance to review them.  We understand that in some cases there may be results that are confusing or concerning to you. Not all laboratory results come back in the same time frame and the provider may be waiting for multiple results in order to interpret others.  Please give [name] 48 hours in order for your provider to thoroughly review all the results before contacting the office for clarification of your results.   Thank you for choosing me and [facility].  Vito Cirigliano, D.O. amb re

## 2020-02-03 NOTE — Telephone Encounter (Signed)
The patient notified Sherri Gibson while she was giving directions to her regarding the ECl that she is on 2lpm oxygen at bedtime every night. Notified the patient she is unable to have the procedure in our [facility]- hospital orders were placed and we will call the patient with a date. She wants after 02/29/2020.

## 2020-02-09 DIAGNOSIS — M2012 Hallux valgus (acquired), left foot: Secondary | ICD-10-CM | POA: Diagnosis not present

## 2020-02-09 DIAGNOSIS — B070 Plantar wart: Secondary | ICD-10-CM | POA: Diagnosis not present

## 2020-02-09 DIAGNOSIS — M2011 Hallux valgus (acquired), right foot: Secondary | ICD-10-CM | POA: Diagnosis not present

## 2020-02-09 DIAGNOSIS — E119 Type 2 diabetes mellitus without complications: Secondary | ICD-10-CM | POA: Diagnosis not present

## 2020-02-10 ENCOUNTER — Ambulatory Visit: Payer: BC Managed Care – PPO | Admitting: Orthopedic Surgery

## 2020-02-10 ENCOUNTER — Other Ambulatory Visit: Payer: Self-pay

## 2020-02-10 DIAGNOSIS — M19012 Primary osteoarthritis, left shoulder: Secondary | ICD-10-CM

## 2020-02-10 DIAGNOSIS — M19011 Primary osteoarthritis, right shoulder: Secondary | ICD-10-CM

## 2020-02-12 ENCOUNTER — Other Ambulatory Visit: Payer: Self-pay | Admitting: Osteopathic Medicine

## 2020-02-12 ENCOUNTER — Other Ambulatory Visit: Payer: Self-pay | Admitting: Pulmonary Disease

## 2020-02-13 ENCOUNTER — Encounter: Payer: Self-pay | Admitting: Orthopedic Surgery

## 2020-02-13 NOTE — Progress Notes (Signed)
Office Visit Note   Patient: Sherri Gibson           Date of Birth: 1960/12/18           MRN: [id number] Visit Date: 02/10/2020 Requested by: Emeterio Reeve, [address] [place],  [place] [address] PCP: Emeterio Reeve, DO  Subjective: Chief Complaint  Patient presents with  . Other     Scan review left shoulder    HPI: Sherri Gibson is a 60 year old patient with left shoulder pain.  Since we seen her last has had a CT scan for preop planning.  She has significant avascular necrosis and glenoid changes as well.  Rotator cuff appears normal.  As can best be approximated by the CT scan.  Severe end-stage glenohumeral arthritis and avascular necrosis is present.  Patient reports difficulties with all activities of daily living as well as significant pain.  Right shoulder is moderately functional but also painful.              ROS: All systems reviewed are negative as they relate to the chief complaint within the history of present illness.  Patient denies  fevers or chills.   Assessment & Plan: Visit Diagnoses:  1. Primary osteoarthritis of both shoulders     Plan: Impression is left shoulder avascular necrosis and significant pain with limitation of function.  CT scan performed for preop patient specific instrumentation and planning.  Rotator cuff looks viable on the scan as well.  She does have slight subscap weakness but I think that is more related to pain than true functional deficit.  Tentative plan at this time is for total shoulder replacement.  May consider using convertible glenoid if the patient is size appropriate for that option.  Stemless it versus micro stem also a consideration for this patient.  Risk and benefits of surgery are discussed including but not limited to infection nerve vessel damage incomplete healing incomplete restoration of function as well as potential need for revision in her lifetime is all discussed.  Based on her functional  limitations and pain she is willing to accept those risk for the chance at an improved quality of life.  Follow-Up Instructions: No follow-ups on file.   Orders:  No orders of the defined types were placed in this encounter.  No orders of the defined types were placed in this encounter.     Procedures: No procedures performed   Clinical Data: No additional findings.  Objective: Vital Signs: There were no vitals taken for this visit.  Physical Exam:   Constitutional: Patient appears well-developed HEENT:  Head: Normocephalic Eyes:EOM are normal Neck: Normal range of motion Cardiovascular: Normal rate Pulmonary/chest: Effort normal Neurologic: Patient is alert Skin: Skin is warm Psychiatric: Patient has normal mood and affect    Ortho Exam: Ortho exam demonstrates functional deltoid.  She does have pretty reasonable subscap strength at 5 out of 5 compared to 5+ out of 5 on the right.  Some of this is limited by pain.  External rotation strength also 5 out of 5.  Supraspinatus strength difficult to assess due to pain but seems intact.  Deltoid is functional.  Motor or sensory function to the hand is intact.  Radial pulse intact.  Passive range of motion is only about 45 degrees of forward flexion passively 50 degrees of abduction passively and external rotation only to about 20 degrees.  Specialty Comments:  No specialty comments available.  Imaging: No results found.   [place]  History: Patient Active Problem List   Diagnosis Date Noted  . Encounter for screening for lung cancer 04/13/2019  . Primary osteoarthritis of both shoulders 03/06/2019  . Chronic hypoxemic respiratory failure ([place]) 12/22/2018  . Osteopenia 12/20/2017  . Prediabetes 09/23/2017  . Colonoscopy refused 05/29/2017  . COPD, severe ([place]) 05/29/2017  . Multiple pulmonary nodules 05/29/2017  . Trigger thumb, right thumb 05/29/2017  . Primary osteoarthritis of first carpometacarpal joint of left hand  05/29/2017  . Kidney lesion 12/14/2016  . Erythrocytosis 11/13/2016  . Breast cancer ([place]) 09/20/2016  . Essential hypertension 09/20/2016  . Insomnia 09/20/2016  . Mixed hyperlipidemia 09/20/2016   Past Medical History:  Diagnosis Date  . Arthritis    Both Shoulders  . Breast cancer ([place]) 2016   Left   . COPD (chronic obstructive pulmonary disease) ([place])   . High blood pressure   . High cholesterol   . Prediabetes     Family History  Problem Relation Age of Onset  . Breast cancer Mother   . Diabetes Brother   . Colon cancer Neg Hx   . Esophageal cancer Neg Hx     Past Surgical History:  Procedure Laterality Date  . BREAST BIOPSY    . BREAST LUMPECTOMY Left   . BREAST LUMPECTOMY WITH AXILLARY LYMPH NODE BIOPSY  2016  . COLONOSCOPY  2017   [facility]. think there was colon polyps  . PARTIAL HYSTERECTOMY     Social History   Occupational History  . Not on file  Tobacco Use  . Smoking status: Former Smoker    Packs/day: 2.50    Years: 44.00    Pack years: 110.00    Types: Cigarettes    Quit date: 11/29/2016    Years since quitting: 3.2  . Smokeless tobacco: Never Used  Vaping Use  . Vaping Use: Never used  Substance and Sexual Activity  . Alcohol use: Not Currently  . Drug use: Never  . Sexual activity: Yes    Partners: Male    Birth control/protection: None

## 2020-02-17 ENCOUNTER — Other Ambulatory Visit: Payer: Self-pay | Admitting: Sports Medicine

## 2020-02-17 DIAGNOSIS — M19012 Primary osteoarthritis, left shoulder: Secondary | ICD-10-CM

## 2020-02-17 DIAGNOSIS — M19011 Primary osteoarthritis, right shoulder: Secondary | ICD-10-CM

## 2020-02-17 NOTE — Telephone Encounter (Signed)
Pt called looking to schedule her procedure at the hospital. She mentioned that she is having surgery in three weeks and would like to have procedure done before that. Pls call her.

## 2020-02-17 NOTE — Telephone Encounter (Signed)
Last written 11/19/2019 #90 with 3 refills Last appt 12/02/2019

## 2020-02-24 ENCOUNTER — Encounter: Payer: Self-pay | Admitting: Orthopedic Surgery

## 2020-02-26 ENCOUNTER — Other Ambulatory Visit: Payer: Self-pay | Admitting: Osteopathic Medicine

## 2020-02-26 ENCOUNTER — Telehealth: Payer: Self-pay

## 2020-02-26 DIAGNOSIS — J449 Chronic obstructive pulmonary disease, unspecified: Secondary | ICD-10-CM | POA: Diagnosis not present

## 2020-02-26 DIAGNOSIS — R7303 Prediabetes: Secondary | ICD-10-CM

## 2020-02-26 NOTE — Telephone Encounter (Signed)
Thx so very much

## 2020-02-26 NOTE — Telephone Encounter (Signed)
I have talked with supervisor Bryson Ha. She will contact patient and have her come in for additional imaging.

## 2020-02-26 NOTE — Telephone Encounter (Signed)
Patient in process of being scheduled for shoulder arthroplasty.  We are in need of images from CT scan being burned on a disc to provide to [employer].  I called to [facility] and talked with Apolonio Schneiders to see if we could get disc. However, images were obtained at 35mm rather than the 0.662mm that was written in the order. I have provided my phone number for management to call me back to see how we can get patient back to facility to have additional scanning done at no cost to patient since error made on Imaging Facility part so that we can get images to vendor to get prosthesis made for patient.

## 2020-03-01 NOTE — Telephone Encounter (Signed)
I have been out of the office from 02/11/2020 through 02/29/2020. The patient wanted to have her ECL completed prior to 04/02/2020. Unable to accommodate her in that time frame. Patient verbalized understanding and will call office back after her shoulder replacement surgery in March.

## 2020-03-03 ENCOUNTER — Ambulatory Visit: Payer: BC Managed Care – PPO | Admitting: Osteopathic Medicine

## 2020-03-03 ENCOUNTER — Encounter: Payer: Self-pay | Admitting: Osteopathic Medicine

## 2020-03-03 ENCOUNTER — Other Ambulatory Visit: Payer: Self-pay

## 2020-03-03 VITALS — BP 119/86 | HR 86 | Temp 98.0°F | Wt 118.0 lb

## 2020-03-03 DIAGNOSIS — R634 Abnormal weight loss: Secondary | ICD-10-CM

## 2020-03-03 DIAGNOSIS — R7303 Prediabetes: Secondary | ICD-10-CM

## 2020-03-03 DIAGNOSIS — R1319 Other dysphagia: Secondary | ICD-10-CM | POA: Diagnosis not present

## 2020-03-03 DIAGNOSIS — J449 Chronic obstructive pulmonary disease, unspecified: Secondary | ICD-10-CM

## 2020-03-03 DIAGNOSIS — M8580 Other specified disorders of bone density and structure, unspecified site: Secondary | ICD-10-CM

## 2020-03-03 DIAGNOSIS — Z87891 Personal history of nicotine dependence: Secondary | ICD-10-CM

## 2020-03-03 LAB — POCT GLYCOSYLATED HEMOGLOBIN (HGB A1C): Hemoglobin A1C: 5.9 % — AB (ref 4.0–5.6)

## 2020-03-03 NOTE — Progress Notes (Signed)
HPI: Sherri Gibson is a 60 y.o. female who  has a past medical history of Arthritis, Breast cancer ([place]) (2016), COPD (chronic obstructive pulmonary disease) ([place]), High blood pressure, High cholesterol, and Prediabetes.  she presents to [facility] today, 03/03/20,  for chief complaint of:  Follow up diabetes  Current meds:  500 mg metformin daily   A1C:  12/02/2019  6.7  03/03/2020 5.9 Pt reports increased thirst and urination that she attributes to dry mouth. Denies: numbness/tingling, visual changes  Unintentional weight loss Pt with a history of breast cancer (2016) and COPD has lost about 15 lbs over the last year. 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 Pt has been established with GI and plans to have colonoscopy and upper endoscopy after her her shoulder surgery in March. CT chest already ordered for lung cancer screening.  Associated: early satiety, dry mouth, decreased appetite, difficulty swallowing (feels that things get stuck) Denies: constipation, diarrhea, blood in stool, abdominal pain, shortness of breath, chest pain, fevers, night sweats, palpable breast masses/lumps, palpable lymph nodes, breast discharge, breast skin changes, breast pain    Past medical, surgical, social and family history reviewed:  Patient Active Problem List   Diagnosis Date Noted  . Encounter for screening for lung cancer 04/13/2019  . Primary osteoarthritis of both shoulders 03/06/2019  . Chronic hypoxemic respiratory failure ([place]) 12/22/2018  . Osteopenia 12/20/2017  . Prediabetes 09/23/2017  . Colonoscopy refused 05/29/2017  . COPD, severe ([place]) 05/29/2017  . Multiple pulmonary nodules 05/29/2017  . Trigger thumb, right thumb 05/29/2017  . Primary osteoarthritis of first carpometacarpal joint of left hand 05/29/2017  . Kidney lesion 12/14/2016  .  Erythrocytosis 11/13/2016  . Breast cancer ([place]) 09/20/2016  . Essential hypertension 09/20/2016  . Insomnia 09/20/2016  . Mixed hyperlipidemia 09/20/2016    Past Surgical History:  Procedure Laterality Date  . BREAST BIOPSY    . BREAST LUMPECTOMY Left   . BREAST LUMPECTOMY WITH AXILLARY LYMPH NODE BIOPSY  2016  . COLONOSCOPY  2017   [facility]. think there was colon polyps  . PARTIAL HYSTERECTOMY      Social History   Tobacco Use  . Smoking status: Former Smoker    Packs/day: 2.50    Years: 44.00    Pack years: 110.00    Types: Cigarettes    Quit date: 11/29/2016    Years since quitting: 3.2  . Smokeless tobacco: Never Used  Substance Use Topics  . Alcohol use: Not Currently    Family History  Problem Relation Age of Onset  . Breast cancer Mother   . Diabetes Brother   . Colon cancer Neg Hx   . Esophageal cancer Neg Hx      Current medication list and allergy/intolerance information reviewed:    Current Outpatient Medications  Medication Sig Dispense Refill  . albuterol (PROAIR HFA) 108 (90 Base) MCG/ACT inhaler inhale 2 puff by inhalation route  every 4 - 6 hours as needed 25.5 g 1  . albuterol (PROVENTIL) (2.5 MG/3ML) 0.083% nebulizer solution Take 3 mLs (2.5 mg total) by nebulization every 2 (two) hours as needed for wheezing or shortness of breath. 75 mL 99  . amLODipine (NORVASC) 10 MG tablet TAKE 1 TABLET DAILY 90 tablet 3  . aspirin 81 MG chewable tablet Chew by mouth.    Marland Kitchen atorvastatin (LIPITOR) 40 MG  tablet TAKE 1 TABLET DAILY (DISCONTINUE PRAVASTATIN) 90 tablet 3  . betamethasone dipropionate (DIPROLENE) 0.05 % cream Apply topically 2 (two) times daily. To affected area(s) as needed 15 g 1  . Calcium-Magnesium-Vitamin D (CALCIUM 1200+D3 PO) Take 1 capsule by mouth 2 (two) times daily.    . cimetidine (TAGAMET) 800 MG tablet Take 800 mg by mouth at bedtime.    . cyclobenzaprine (FLEXERIL) 10 MG tablet TAKE 1 TABLET  THREE TIMES A DAY AS NEEDED FOR MUSCLE SPASMS 90 tablet 3  . loratadine (CLARITIN) 10 MG tablet TAKE 1 TABLET BY MOUTH EVERY DAY 90 tablet 1  . losartan (COZAAR) 50 MG tablet TAKE 1 TABLET DAILY 90 tablet 3  . metFORMIN (GLUCOPHAGE) 500 MG tablet TAKE 1 TABLET DAILY WITH BREAKFAST (NEED A DIABETES MELLITUS CHECK) 90 tablet 3  . MITIGARE 0.6 MG CAPS TAKE 1 CAPSULE DAILY 90 capsule 3  . montelukast (SINGULAIR) 10 MG tablet TAKE 1 TABLET AT BEDTIME 90 tablet 3  . ondansetron (ZOFRAN) 4 MG tablet Take 1 tablet (4 mg total) by mouth every 6 (six) hours as needed for nausea or vomiting (use for nausea with colonoscopy prep). 20 tablet 0  . OXYGEN Inhale 2 L into the lungs at bedtime.    . predniSONE (DELTASONE) 20 MG tablet Take 1 tablet (20 mg total) by mouth 2 (two) times daily with a meal. 10 tablet 0  . Salicylic Acid (OCCLUSAL-HP EX) Apply topically.    Marland Kitchen SPIRIVA RESPIMAT 2.5 MCG/ACT AERS USE 2 INHALATIONS DAILY 12 g 3  . SYMBICORT 160-4.5 MCG/ACT inhaler USE 2 INHALATIONS TWICE A DAY 30.6 g 3  . traMADol (ULTRAM) 50 MG tablet TAKE 1-2 TABLETS BY MOUTH EVERY 8 HOURS AS NEEDED FOR MODERATE PAIN. MAX OF 6 TABLETS PER DAY 90 tablet 0  . traZODone (DESYREL) 50 MG tablet TAKE 1 TABLET DAILY AT BEDTIME 90 tablet 3   No current facility-administered medications for this visit.    Allergies  Allergen Reactions  . Codeine Itching    Feels like something crawling on her skin   . Tetanus Toxoids Other (See Comments)    Patient stated,"I was given a Pneumonia shot at the same time I got the Tetanus shot, in the same arm."      Review of Systems:  Constitutional:  No  fever, no chills,  + unintentional weight loss   HEENT: no vision change, + dry mouth  Cardiac: No  chest pain, No  pressure, No palpitations  Respiratory:  No  shortness of breath.   Gastrointestinal: No  abdominal pain, No  nausea, No  blood in stool, No  diarrhea, No  Constipation, + early satiety, + dysphagia    Genitourinary: No  incontinence, No  abnormal genital bleeding, No abnormal genital discharge  Breast: No palpable masses, no skin changes, no discharge, no breast pain   Hem/Onc: No  abnormal lymph node  Endocrine: No cold intolerance,  No heat intolerance. + polyuria, +polydipsia, No polyphagia   Neurologic: No  weakness, No  dizziness  Psychiatric: +  concerns with depression, +  concerns with anxiety  Exam:  BP 119/86 (BP Location: Left Arm, Patient Position: Sitting, Cuff Size: Normal)   Pulse 86   Temp 98 F (36.7 C) (Oral)   Wt 118 lb (53.5 kg)   BMI 23.83 kg/m   Constitutional: VS see above. General Appearance: alert, well-developed, well-nourished, NAD  Eyes: Normal lids and conjunctive, non-icteric sclera  Neck: No masses, trachea midline. No thyroid  enlargement. No tenderness/mass appreciated. No lymphadenopathy  Respiratory: Normal respiratory effort. no wheeze, no rhonchi, no rales  Cardiovascular: S1/S2 normal, no murmur, no rub/gallop auscultated. RRR. No lower extremity edema.   Gastrointestinal: Nontender, no masses. No hepatomegaly, no splenomegaly. No hernia appreciated. Bowel sounds normal. Rectal exam deferred.   Musculoskeletal: Gait normal. No clubbing/cyanosis of digits.   Neurological: Normal balance/coordination. No tremor.   Skin: warm, dry, intact.   Psychiatric: Normal judgment/insight. Normal mood and affect.    Results for orders placed or performed in visit on 03/03/20 (from the past 72 hour(s))  POCT HgB A1C     Status: Abnormal   Collection Time: 03/03/20  8:22 AM  Result Value Ref Range   Hemoglobin A1C 5.9 (A) 4.0 - 5.6 %   HbA1c POC (<> result, manual entry)     HbA1c, POC (prediabetic range)     HbA1c, POC (controlled diabetic range)      No results found.   ASSESSMENT/PLAN: The primary encounter diagnosis was Prediabetes. Diagnoses of Unintended weight loss, Esophageal dysphagia, Former smoker, Chronic obstructive  pulmonary disease, unspecified COPD type ([place]), and Osteopenia, unspecified location were also pertinent to this visit.   Prediabetes - at goal (6.7 --> 5.9)  Continue metformin 500 mg daily   Follow up in 3 months for A1C  Unintended weight loss, esophageal dysphagia  CBC, CMP  TSH  CRP, ESR  HCV and HIV testing  Recommend starting multivitamin   Keep scheduled mammogram for March   follow up on previously ordered CT chest for lung cancer screening  Follow up with GI for colonoscopy and EGD   Follow up in 3 months for weight check  Health maintenance   Vitamin D level - Hx of osteopenia   Lipid panel   Dry mouth   Recommend citrus wedges and or hard candy to increase salivation   Orders Placed This Encounter  Procedures  . CBC with Differential/Platelet  . COMPLETE METABOLIC PANEL WITH GFR  . Lipid panel  . TSH  . VITAMIN D 25 Hydroxy (Vit-D Deficiency, Fractures)  . HIV Antibody (routine testing w rflx)  . Hepatitis C antibody  . Sedimentation rate  . High sensitivity CRP  . POCT HgB A1C    No orders of the defined types were placed in this encounter.   Patient Instructions  Plan: Sugars look good GI: upper and lower scope - upper to address swallowing / early fullness issues Weight: labs today, would start multivitamin Dry mouth: sucking on citrus wedges, hard candies will increase salivation           Visit summary with medication list and pertinent instructions was printed for patient to review. All questions at time of visit were answered - patient instructed to contact office with any additional concerns or updates. ER/RTC precautions were reviewed with the patient.    Please note: voice recognition software was used to produce this document, and typos may escape review. Please contact Dr. Sheppard Coil for any needed clarifications.     Follow-up plan: Return in about 3 months (around 05/31/2020) for MONITOR A1C AND WEIGHT .

## 2020-03-03 NOTE — Patient Instructions (Addendum)
Plan: Sugars look good GI: upper and lower scope - upper to address swallowing / early fullness issues Weight: labs today, would start multivitamin Dry mouth: sucking on citrus wedges, hard candies will increase salivation

## 2020-03-04 LAB — COMPLETE METABOLIC PANEL WITH GFR
AG Ratio: 1.5 (calc) (ref 1.0–2.5)
ALT: 14 U/L (ref 6–29)
AST: 20 U/L (ref 10–35)
Albumin: 4.4 g/dL (ref 3.6–5.1)
Alkaline phosphatase (APISO): 94 U/L (ref 37–153)
BUN: 18 mg/dL (ref 7–25)
CO2: 28 mmol/L (ref 20–32)
Calcium: 9.7 mg/dL (ref 8.6–10.4)
Chloride: 101 mmol/L (ref 98–110)
Creat: 0.86 mg/dL (ref 0.50–1.05)
GFR, Est African American: 86 mL/min/{1.73_m2} (ref >=60)
GFR, Est Non African American: 74 mL/min/{1.73_m2} (ref >=60)
Globulin: 3 g/dL (calc) (ref 1.9–3.7)
Glucose, Bld: 89 mg/dL (ref 65–99)
Potassium: 4.2 mmol/L (ref 3.5–5.3)
Sodium: 140 mmol/L (ref 135–146)
Total Bilirubin: 0.5 mg/dL (ref 0.2–1.2)
Total Protein: 7.4 g/dL (ref 6.1–8.1)

## 2020-03-04 LAB — CBC WITH DIFFERENTIAL/PLATELET
Absolute Monocytes: 536 cells/uL (ref 200–950)
Basophils Absolute: 27 cells/uL (ref 0–200)
Basophils Relative: 0.4 %
Eosinophils Absolute: 208 cells/uL (ref 15–500)
Eosinophils Relative: 3.1 %
HCT: 41.7 % (ref 35.0–45.0)
Hemoglobin: 13.9 g/dL (ref 11.7–15.5)
Lymphs Abs: 1186 cells/uL (ref 850–3900)
MCH: 28.7 pg (ref 27.0–33.0)
MCHC: 33.3 g/dL (ref 32.0–36.0)
MCV: 86 fL (ref 80.0–100.0)
MPV: 10.2 fL (ref 7.5–12.5)
Monocytes Relative: 8 %
Neutro Abs: 4744 cells/uL (ref 1500–7800)
Neutrophils Relative %: 70.8 %
Platelets: 419 10*3/uL — ABNORMAL HIGH (ref 140–400)
RBC: 4.85 10*6/uL (ref 3.80–5.10)
RDW: 12.2 % (ref 11.0–15.0)
Total Lymphocyte: 17.7 %
WBC: 6.7 10*3/uL (ref 3.8–10.8)

## 2020-03-04 LAB — SEDIMENTATION RATE: Sed Rate: 14 mm/h (ref 0–30)

## 2020-03-04 LAB — TSH: TSH: 1.15 mIU/L (ref 0.40–4.50)

## 2020-03-04 LAB — HIV ANTIBODY (ROUTINE TESTING W REFLEX): HIV 1&2 Ab, 4th Generation: NONREACTIVE

## 2020-03-09 ENCOUNTER — Other Ambulatory Visit: Payer: Self-pay | Admitting: Osteopathic Medicine

## 2020-03-09 DIAGNOSIS — M19012 Primary osteoarthritis, left shoulder: Secondary | ICD-10-CM

## 2020-03-09 DIAGNOSIS — M19011 Primary osteoarthritis, right shoulder: Secondary | ICD-10-CM

## 2020-03-11 ENCOUNTER — Encounter: Payer: BC Managed Care – PPO | Admitting: Gastroenterology

## 2020-03-11 ENCOUNTER — Other Ambulatory Visit: Payer: Self-pay | Admitting: Family

## 2020-03-11 DIAGNOSIS — Z1231 Encounter for screening mammogram for malignant neoplasm of breast: Secondary | ICD-10-CM

## 2020-03-28 DIAGNOSIS — J449 Chronic obstructive pulmonary disease, unspecified: Secondary | ICD-10-CM | POA: Diagnosis not present

## 2020-04-01 ENCOUNTER — Other Ambulatory Visit: Payer: Self-pay | Admitting: Osteopathic Medicine

## 2020-04-01 DIAGNOSIS — M19011 Primary osteoarthritis, right shoulder: Secondary | ICD-10-CM

## 2020-04-05 ENCOUNTER — Encounter: Payer: Self-pay | Admitting: Pulmonary Disease

## 2020-04-05 ENCOUNTER — Other Ambulatory Visit: Payer: Self-pay

## 2020-04-05 ENCOUNTER — Ambulatory Visit: Payer: BC Managed Care – PPO | Admitting: Pulmonary Disease

## 2020-04-05 VITALS — BP 134/84 | HR 96 | Temp 98.6°F | Ht 59.0 in | Wt 122.0 lb

## 2020-04-05 DIAGNOSIS — J9611 Chronic respiratory failure with hypoxia: Secondary | ICD-10-CM | POA: Diagnosis not present

## 2020-04-05 DIAGNOSIS — J449 Chronic obstructive pulmonary disease, unspecified: Secondary | ICD-10-CM | POA: Diagnosis not present

## 2020-04-05 DIAGNOSIS — Z01811 Encounter for preprocedural respiratory examination: Secondary | ICD-10-CM | POA: Diagnosis not present

## 2020-04-05 NOTE — Progress Notes (Unsigned)
Subjective:   PATIENT ID: Sherri Gibson GENDER: female DOB: 05-Jun-1960, MRN: [id number]   HPI  Chief Complaint  Patient presents with  . Consult    Surgical clearance   Reason for Visit: Follow-up  Ms. 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 [place] Bells patient, last seen September 2019.  She was initially diagnosed with COPD in 2010 and has been on oxygen since 2017. She has been compliant with her Symbicort 160-4.5 mcg 2 puffs twice daily and Spiriva 2.5 mcg 2 puffs daily. She lives in a 3 story house and will need to wear her oxygen with it. She walks regularly on her treadmill.  She is planning for shoulder surgery. She reports her COPD is overall controlled. She is compliant with her Symbicort and Spiriva. She rarely use her albuterol. She is able to walk upstairs and around the house. Able to do yardwork. No limitations in activity except when needing to carry things due to her shoulder issues. Denies wheezing or coughing. She wears her oxygen overnight but unsure if she actually drops. She will periodically check her oxygen with exertion and the lowest she sees it will go as low as 90%. It is never below 88%. Last COPD exacerbation November 2021 which was treated with steroids as outpatient.  Social History: She smoked for many years, typically 2 packs per day from age [age].  She quit with Chantix in 2018. 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  I have personally reviewed patient's past medical/family/social history/allergies/current medications.  Past Medical History:  Diagnosis Date  . Arthritis    Both Shoulders  . Breast cancer ([place]) 2016   Left   . COPD (chronic obstructive pulmonary disease)  ([place])   . High blood pressure   . High cholesterol   . Prediabetes      Outpatient Medications Prior to Visit  Medication Sig Dispense Refill  . albuterol (PROAIR HFA) 108 (90 Base) MCG/ACT inhaler inhale 2 puff by inhalation route  every 4 - 6 hours as needed 25.5 g 1  . albuterol (PROVENTIL) (2.5 MG/3ML) 0.083% nebulizer solution Take 3 mLs (2.5 mg total) by nebulization every 2 (two) hours as needed for wheezing or shortness of breath. 75 mL 99  . amLODipine (NORVASC) 10 MG tablet TAKE 1 TABLET DAILY (Patient taking differently: Take 10 mg by mouth at bedtime.) 90 tablet 3  . aspirin 81 MG chewable tablet Chew 81 mg by mouth at bedtime.    Marland Kitchen atorvastatin (LIPITOR) 40 MG tablet TAKE 1 TABLET DAILY (DISCONTINUE PRAVASTATIN) (Patient taking differently: Take 40 mg by mouth daily.) 90 tablet 3  . Calcium-Magnesium-Vitamin D (CALCIUM 1200+D3 PO) Take 600 mg by mouth 2 (two) times daily.    . cyclobenzaprine (FLEXERIL) 10 MG tablet TAKE 1 TABLET THREE TIMES A DAY AS NEEDED FOR MUSCLE SPASMS (Patient taking differently: Take 10 mg by mouth daily.) 90 tablet 3  . loratadine (CLARITIN) 10 MG tablet TAKE 1 TABLET BY MOUTH EVERY DAY (Patient taking differently: Take 10 mg by mouth at bedtime.) 90 tablet 1  . losartan (COZAAR) 50 MG tablet TAKE 1 TABLET DAILY (Patient taking differently: Take 50 mg by mouth at bedtime.) 90 tablet 3  . metFORMIN (GLUCOPHAGE) 500 MG tablet  TAKE 1 TABLET DAILY WITH BREAKFAST (NEED A DIABETES MELLITUS CHECK) (Patient taking differently: Take 500 mg by mouth daily with breakfast.) 90 tablet 3  . montelukast (SINGULAIR) 10 MG tablet TAKE 1 TABLET AT BEDTIME (Patient taking differently: Take 10 mg by mouth at bedtime.) 90 tablet 3  . Multiple Vitamins-Minerals (MULTIVITAMIN WITH MINERALS) tablet Take 1 tablet by mouth daily.    . OXYGEN Inhale 2 L into the lungs at bedtime.    Marland Kitchen SPIRIVA RESPIMAT 2.5 MCG/ACT AERS USE 2 INHALATIONS DAILY (Patient taking differently: Inhale 2  puffs into the lungs daily.) 12 g 3  . SYMBICORT 160-4.5 MCG/ACT inhaler USE 2 INHALATIONS TWICE A DAY (Patient taking differently: Inhale 2 puffs into the lungs 2 (two) times daily.) 30.6 g 3  . [START ON 04/09/2020] traMADol (ULTRAM) 50 MG tablet TAKE 1-2 TABLETS BY MOUTH EVERY 8 HOURS AS NEEDED FOR MODERATE PAIN. MAX OF 6 TABLETS PER DAY 90 tablet 0  . traZODone (DESYREL) 50 MG tablet TAKE 1 TABLET DAILY AT BEDTIME (Patient taking differently: Take 50 mg by mouth at bedtime.) 90 tablet 3  . betamethasone dipropionate (DIPROLENE) 0.05 % cream Apply topically 2 (two) times daily. To affected area(s) as needed (Patient not taking: Reported on 04/05/2020) 15 g 1  . MITIGARE 0.6 MG CAPS TAKE 1 CAPSULE DAILY (Patient not taking: Reported on 04/05/2020) 90 capsule 3  . ondansetron (ZOFRAN) 4 MG tablet Take 1 tablet (4 mg total) by mouth every 6 (six) hours as needed for nausea or vomiting (use for nausea with colonoscopy prep). (Patient not taking: Reported on 04/05/2020) 20 tablet 0  . predniSONE (DELTASONE) 20 MG tablet Take 1 tablet (20 mg total) by mouth 2 (two) times daily with a meal. (Patient not taking: Reported on 04/05/2020) 10 tablet 0   No facility-administered medications prior to visit.    Review of Systems  Constitutional: Negative for chills, diaphoresis, fever, malaise/fatigue and weight loss.  HENT: Negative for congestion.   Respiratory: Negative for cough, hemoptysis, sputum production, shortness of breath and wheezing.   Cardiovascular: Negative for chest pain, palpitations and leg swelling.  Musculoskeletal: Positive for joint pain.   Objective:   Vitals:   04/05/20 1354  BP: 134/84  Pulse: 96  Temp: 98.6 F (37 C)  SpO2: 92%  Weight: 122 lb (55.3 kg)  Height: '4\' 11"'  (1.499 m)   SpO2: 92 % O2 Device: None (Room air)  Physical Exam: General: Well-appearing, no acute distress HENT: , AT Eyes: EOMI, no scleral icterus Respiratory: Clear to auscultation bilaterally.  No  crackles, wheezing or rales Cardiovascular: RRR, -M/R/G, no JVD Extremities:-Edema,-tenderness Neuro: AAO x4, CNII-XII grossly intact Psych: Normal mood, normal affect  Data Reviewed:  Imaging: 10/2015 CT chest [facility] multiple calcified granulomatous largest is 8 mm, 2 noncalcified 3 mm left lower lobe nodules 11/2016 CT chest 6-70m nodules Right upper an lower nodules ([place]   04/13/19 CT Lung Screen - Bilateral calcified granulomas bilaterally. Several noncalcified tiny nodules with largest 370m unchanged. Centrilobular emphysema  PFT: 2017 PFT from [facility] FEV1 1.03 L 45% predicted, no air trapping, diffusion capacity 35% predicted  Imaging, labs and tests noted above have been reviewed independently by me.    Assessment & Plan:   Discussion: [age]ear old female with severe COPD, history of chronic respiratory failure, multiple lung nodules and stage 1 IDC of the left breast without recurrence  s/p lumpectomy and radiation who presents for follow-up. Last exacerbation 11/2019 and 12/2018. She is planning for shoulder surgery and requesting pre-op pulmonary clearance.  Severe COPD - well-controlled CONTINUE Symbicort 160-4.5 mcg 2 puffs twice daily.  CONTINUE Spiriva 2.5 mcg 2 puffs daily.  Encourage regular aerobic activity  Chronic hypoxemic respiratory failure - stable Ambulatory O2 today with SpO2 86% with activity and improved to SpO2 95% on 2L CONTINUE 2L O2 via [place] with activity and sleep  Multiple lung nodules - stable Annual lung screen  Peri-operative Assessment of Pulmonary Risk for Non-Thoracic Surgery:  For Ms. [place], risk of perioperative pulmonary complications is increased by:  Severe COPD  Respiratory complications generally occur in 1% of ASA Class I patients, 5% of ASA Class II and 10% of ASA Class III-IV patients These complications rarely result  in mortality and include postoperative pneumonia, atelectasis, pulmonary embolism, ARDS and increased time requiring postoperative mechanical ventilation.  Overall, I recommend proceeding with the surgery if the risk for respiratory complications are outweighed by the potential benefits. This will need to be discussed between the patient and surgeon.  To reduce risks of respiratory complications, I recommend: --Pre- and post-operative incentive spirometry performed frequently while awake --Avoiding use of pancuronium during anesthesia --OOB, encourage mobility post-op  I have discussed the risk factors and recommendations above with the patient.   Health Maintenance Immunization History  Administered Date(s) Administered  . Influenza, Seasonal, Injecte, Preservative Fre 10/15/2016  . Influenza,inj,Quad PF,6+ Mos 09/23/2017, 09/16/2018, 10/14/2019  . PFIZER(Purple Top)SARS-COV-2 Vaccination 04/14/2019, 05/05/2019, 12/05/2019  . Pneumococcal Polysaccharide-23 05/29/2017  . Tdap 05/29/2017   CT Lung Screen - Due 03/2020  Orders Placed This Encounter  Procedures  . Ambulatory Referral for DME    Referral Priority:   Routine    Referral Type:   Durable Medical Equipment Purchase    Number of Visits Requested:   1   No orders of the defined types were placed in this encounter.  Return in about 6 months (around 10/06/2020).  Chi Rodman Pickle, MD [facility] 04/05/2020 2:56 PM  Office Number [phone]

## 2020-04-05 NOTE — Patient Instructions (Signed)
Peri-operative Assessment of Pulmonary Risk for Non-Thoracic Surgery:  For Sherri Gibson, risk of perioperative pulmonary complications is increased by:  Severe COPD  Respiratory complications generally occur in 1% of ASA Class I patients, 5% of ASA Class II and 10% of ASA Class III-IV patients These complications rarely result in mortality and include postoperative pneumonia, atelectasis, pulmonary embolism, ARDS and increased time requiring postoperative mechanical ventilation.  Overall, I recommend proceeding with the surgery if the risk for respiratory complications are outweighed by the potential benefits. This will need to be discussed between the patient and surgeon.  To reduce risks of respiratory complications, I recommend: --Pre- and post-operative incentive spirometry performed frequently while awake --Avoiding use of pancuronium during anesthesia --OOB, encourage mobility post-op  I have discussed the risk factors and recommendations above with the patient.

## 2020-04-06 ENCOUNTER — Encounter: Payer: Self-pay | Admitting: Pulmonary Disease

## 2020-04-06 ENCOUNTER — Ambulatory Visit (INDEPENDENT_AMBULATORY_CARE_PROVIDER_SITE_OTHER): Payer: BC Managed Care – PPO

## 2020-04-06 DIAGNOSIS — Z1231 Encounter for screening mammogram for malignant neoplasm of breast: Secondary | ICD-10-CM | POA: Diagnosis not present

## 2020-04-08 ENCOUNTER — Other Ambulatory Visit (HOSPITAL_COMMUNITY)
Admission: RE | Admit: 2020-04-08 | Discharge: 2020-04-08 | Disposition: A | Discharge status: Home / Self Care | Payer: BC Managed Care – PPO | Source: Ambulatory Visit | Attending: Orthopedic Surgery | Admitting: Orthopedic Surgery

## 2020-04-08 DIAGNOSIS — Z20822 Contact with and (suspected) exposure to covid-19: Secondary | ICD-10-CM | POA: Insufficient documentation

## 2020-04-08 DIAGNOSIS — Z01812 Encounter for preprocedural laboratory examination: Secondary | ICD-10-CM | POA: Insufficient documentation

## 2020-04-08 LAB — SARS CORONAVIRUS 2 (TAT 6-24 HRS): SARS Coronavirus 2: NEGATIVE

## 2020-04-11 ENCOUNTER — Encounter (HOSPITAL_COMMUNITY): Payer: Self-pay | Admitting: Orthopedic Surgery

## 2020-04-11 NOTE — Progress Notes (Signed)
Spoke with pt for pre-op call. Pt denies cardiac history. Pt is pre-diabetic and has COPD. Pulmonary clearance for surgery noted in Epic on 04/05/20. Pt states she does not check her blood sugar at home. Last A1C was 5.9 on 03/03/20.  Covid test done 04/08/20 and it's negative. Pt states she's been in quarantine since the test was done and understands that he stays in quarantine until she comes to the hospital tomorrow.

## 2020-04-12 ENCOUNTER — Encounter (HOSPITAL_COMMUNITY): Payer: Self-pay | Admitting: Orthopedic Surgery

## 2020-04-12 ENCOUNTER — Observation Stay (HOSPITAL_COMMUNITY)
Admission: RE | Admit: 2020-04-12 | Discharge: 2020-04-12 | Disposition: A | Discharge status: Home / Self Care | Payer: BC Managed Care – PPO | Attending: Orthopedic Surgery | Admitting: Orthopedic Surgery

## 2020-04-12 ENCOUNTER — Ambulatory Visit (HOSPITAL_COMMUNITY): Payer: BC Managed Care – PPO | Admitting: Anesthesiology

## 2020-04-12 ENCOUNTER — Encounter (HOSPITAL_COMMUNITY)
Admission: RE | Disposition: A | Discharge status: Home / Self Care | Payer: Self-pay | Source: Home / Self Care | Attending: Orthopedic Surgery

## 2020-04-12 ENCOUNTER — Observation Stay (HOSPITAL_COMMUNITY): Payer: BC Managed Care – PPO

## 2020-04-12 ENCOUNTER — Other Ambulatory Visit: Payer: Self-pay

## 2020-04-12 DIAGNOSIS — M19012 Primary osteoarthritis, left shoulder: Principal | ICD-10-CM | POA: Insufficient documentation

## 2020-04-12 DIAGNOSIS — Z7984 Long term (current) use of oral hypoglycemic drugs: Secondary | ICD-10-CM | POA: Diagnosis not present

## 2020-04-12 DIAGNOSIS — Z96612 Presence of left artificial shoulder joint: Secondary | ICD-10-CM | POA: Diagnosis not present

## 2020-04-12 DIAGNOSIS — Z9889 Other specified postprocedural states: Secondary | ICD-10-CM

## 2020-04-12 DIAGNOSIS — Z79899 Other long term (current) drug therapy: Secondary | ICD-10-CM | POA: Insufficient documentation

## 2020-04-12 DIAGNOSIS — Z471 Aftercare following joint replacement surgery: Secondary | ICD-10-CM | POA: Diagnosis not present

## 2020-04-12 DIAGNOSIS — J449 Chronic obstructive pulmonary disease, unspecified: Secondary | ICD-10-CM | POA: Diagnosis not present

## 2020-04-12 DIAGNOSIS — Z853 Personal history of malignant neoplasm of breast: Secondary | ICD-10-CM | POA: Diagnosis not present

## 2020-04-12 DIAGNOSIS — I10 Essential (primary) hypertension: Secondary | ICD-10-CM | POA: Insufficient documentation

## 2020-04-12 DIAGNOSIS — Z87891 Personal history of nicotine dependence: Secondary | ICD-10-CM | POA: Diagnosis not present

## 2020-04-12 DIAGNOSIS — Z96642 Presence of left artificial hip joint: Secondary | ICD-10-CM | POA: Diagnosis not present

## 2020-04-12 DIAGNOSIS — R7303 Prediabetes: Secondary | ICD-10-CM | POA: Insufficient documentation

## 2020-04-12 DIAGNOSIS — G8918 Other acute postprocedural pain: Secondary | ICD-10-CM | POA: Diagnosis not present

## 2020-04-12 DIAGNOSIS — Z7982 Long term (current) use of aspirin: Secondary | ICD-10-CM | POA: Insufficient documentation

## 2020-04-12 DIAGNOSIS — E7800 Pure hypercholesterolemia, unspecified: Secondary | ICD-10-CM | POA: Diagnosis not present

## 2020-04-12 DIAGNOSIS — M21922 Unspecified acquired deformity of left upper arm: Secondary | ICD-10-CM | POA: Diagnosis not present

## 2020-04-12 HISTORY — DX: Anemia, unspecified: D64.9

## 2020-04-12 HISTORY — DX: Pneumonia, unspecified organism: J18.9

## 2020-04-12 HISTORY — DX: Personal history of urinary calculi: Z87.442

## 2020-04-12 HISTORY — DX: Other complications of anesthesia, initial encounter: T88.59XA

## 2020-04-12 HISTORY — PX: TOTAL SHOULDER ARTHROPLASTY: SHX126

## 2020-04-12 LAB — SURGICAL PCR SCREEN
MRSA, PCR: NEGATIVE
Staphylococcus aureus: NEGATIVE

## 2020-04-12 LAB — URINALYSIS, ROUTINE W REFLEX MICROSCOPIC
Bacteria, UA: NONE SEEN
Bilirubin Urine: NEGATIVE
Glucose, UA: NEGATIVE mg/dL
Hgb urine dipstick: NEGATIVE
Ketones, ur: NEGATIVE mg/dL
Nitrite: NEGATIVE
Protein, ur: NEGATIVE mg/dL
Specific Gravity, Urine: 1.015 (ref 1.005–1.030)
pH: 5 (ref 5.0–8.0)

## 2020-04-12 LAB — CBC
HCT: 41.7 % (ref 36.0–46.0)
Hemoglobin: 13.2 g/dL (ref 12.0–15.0)
MCH: 28.5 pg (ref 26.0–34.0)
MCHC: 31.7 g/dL (ref 30.0–36.0)
MCV: 90.1 fL (ref 80.0–100.0)
Platelets: 364 10*3/uL (ref 150–400)
RBC: 4.63 MIL/uL (ref 3.87–5.11)
RDW: 13.2 % (ref 11.5–15.5)
WBC: 6.6 10*3/uL (ref 4.0–10.5)
nRBC: 0 % (ref 0.0–0.2)

## 2020-04-12 LAB — BASIC METABOLIC PANEL
Anion gap: 10 (ref 5–15)
BUN: 12 mg/dL (ref 6–20)
CO2: 26 mmol/L (ref 22–32)
Calcium: 9.4 mg/dL (ref 8.9–10.3)
Chloride: 104 mmol/L (ref 98–111)
Creatinine, Ser: 0.75 mg/dL (ref 0.44–1.00)
GFR, Estimated: >60 mL/min (ref >60)
Glucose, Bld: 88 mg/dL (ref 70–99)
Potassium: 3.8 mmol/L (ref 3.5–5.1)
Sodium: 140 mmol/L (ref 135–145)

## 2020-04-12 LAB — GLUCOSE, CAPILLARY: Glucose-Capillary: 95 mg/dL (ref 70–99)

## 2020-04-12 SURGERY — ARTHROPLASTY, SHOULDER, TOTAL
Anesthesia: Regional | Site: Shoulder | Laterality: Left

## 2020-04-12 MED ORDER — LIDOCAINE 2% (20 MG/ML) 5 ML SYRINGE
INTRAMUSCULAR | Status: DC | PRN
  Administered 2020-04-12: 40 mg via INTRAVENOUS

## 2020-04-12 MED ORDER — FLUTICASONE FUROATE-VILANTEROL 200-25 MCG/INH IN AEPB: 1.0000 | INHALATION_SPRAY | Freq: Every day | RESPIRATORY_TRACT | Status: DC

## 2020-04-12 MED ORDER — ONDANSETRON HCL 4 MG/2ML IJ SOLN
INTRAMUSCULAR | Status: DC | PRN
  Administered 2020-04-12: 4 mg via INTRAVENOUS

## 2020-04-12 MED ORDER — BUPIVACAINE LIPOSOME 1.3 % IJ SUSP
INTRAMUSCULAR | Status: DC | PRN
  Administered 2020-04-12: 10 mL

## 2020-04-12 MED ORDER — LOSARTAN POTASSIUM 50 MG PO TABS: 50.0000 mg | ORAL_TABLET | Freq: Every day | ORAL | Status: DC

## 2020-04-12 MED ORDER — MIDAZOLAM HCL 2 MG/2ML IJ SOLN: 2.0000 mg | Freq: Once | INTRAMUSCULAR | Status: AC

## 2020-04-12 MED ORDER — ALBUTEROL SULFATE HFA 108 (90 BASE) MCG/ACT IN AERS: 2.0000 | INHALATION_SPRAY | RESPIRATORY_TRACT | Status: DC | PRN

## 2020-04-12 MED ORDER — AMLODIPINE BESYLATE 10 MG PO TABS: 10.0000 mg | ORAL_TABLET | Freq: Every day | ORAL | Status: DC

## 2020-04-12 MED ORDER — TIOTROPIUM BROMIDE MONOHYDRATE 2.5 MCG/ACT IN AERS: 2.0000 | INHALATION_SPRAY | Freq: Every day | RESPIRATORY_TRACT | Status: DC

## 2020-04-12 MED ORDER — CEPHALEXIN 500 MG PO CAPS: 1000.0000 mg | ORAL_CAPSULE | Freq: Once | ORAL | 0 refills | Status: AC

## 2020-04-12 MED ORDER — ROCURONIUM BROMIDE 100 MG/10ML IV SOLN
INTRAVENOUS | Status: DC | PRN
  Administered 2020-04-12: 60 mg via INTRAVENOUS

## 2020-04-12 MED ORDER — SUGAMMADEX SODIUM 200 MG/2ML IV SOLN
INTRAVENOUS | Status: DC | PRN
  Administered 2020-04-12: 125 mg via INTRAVENOUS

## 2020-04-12 MED ORDER — CHLORHEXIDINE GLUCONATE 0.12 % MT SOLN
15.0000 mL | Freq: Once | OROMUCOSAL | Status: AC
  Administered 2020-04-12: 15 mL via OROMUCOSAL
  Filled 2020-04-12: qty 15

## 2020-04-12 MED ORDER — FENTANYL CITRATE (PF) 100 MCG/2ML IJ SOLN: 25.0000 ug | INTRAMUSCULAR | Status: DC | PRN

## 2020-04-12 MED ORDER — CEFAZOLIN SODIUM-DEXTROSE 2-4 GM/100ML-% IV SOLN
2.0000 g | INTRAVENOUS | Status: AC
  Administered 2020-04-12: 2 g via INTRAVENOUS
  Filled 2020-04-12: qty 100

## 2020-04-12 MED ORDER — POVIDONE-IODINE 7.5 % EX SOLN
Freq: Once | CUTANEOUS | Status: DC
  Filled 2020-04-12: qty 118

## 2020-04-12 MED ORDER — FENTANYL CITRATE (PF) 100 MCG/2ML IJ SOLN: 100.0000 ug | Freq: Once | INTRAMUSCULAR | Status: AC

## 2020-04-12 MED ORDER — FENTANYL CITRATE (PF) 100 MCG/2ML IJ SOLN
INTRAMUSCULAR | Status: AC
  Administered 2020-04-12: 100 ug via INTRAVENOUS
  Filled 2020-04-12: qty 2

## 2020-04-12 MED ORDER — 0.9 % SODIUM CHLORIDE (POUR BTL) OPTIME
TOPICAL | Status: DC | PRN
  Administered 2020-04-12: 1000 mL

## 2020-04-12 MED ORDER — FENTANYL CITRATE (PF) 250 MCG/5ML IJ SOLN
INTRAMUSCULAR | Status: DC | PRN
  Administered 2020-04-12: 50 ug via INTRAVENOUS
  Administered 2020-04-12: 25 ug via INTRAVENOUS

## 2020-04-12 MED ORDER — OXYCODONE HCL 5 MG/5ML PO SOLN: 5.0000 mg | Freq: Once | ORAL | Status: DC | PRN

## 2020-04-12 MED ORDER — PHENYLEPHRINE 40 MCG/ML (10ML) SYRINGE FOR IV PUSH (FOR BLOOD PRESSURE SUPPORT)
PREFILLED_SYRINGE | INTRAVENOUS | Status: DC | PRN
  Administered 2020-04-12 (×2): 80 ug via INTRAVENOUS
  Administered 2020-04-12: 40 ug via INTRAVENOUS
  Administered 2020-04-12 (×2): 80 ug via INTRAVENOUS

## 2020-04-12 MED ORDER — ASPIRIN 81 MG PO CHEW: 81.0000 mg | CHEWABLE_TABLET | Freq: Every day | ORAL | Status: DC

## 2020-04-12 MED ORDER — MIDAZOLAM HCL 2 MG/2ML IJ SOLN
INTRAMUSCULAR | Status: AC
  Filled 2020-04-12: qty 2

## 2020-04-12 MED ORDER — KETOROLAC TROMETHAMINE 10 MG PO TABS: 10.0000 mg | ORAL_TABLET | Freq: Three times a day (TID) | ORAL | 0 refills | Status: DC | PRN

## 2020-04-12 MED ORDER — VANCOMYCIN HCL 1000 MG IV SOLR
INTRAVENOUS | Status: AC
  Filled 2020-04-12: qty 1000

## 2020-04-12 MED ORDER — FENTANYL CITRATE (PF) 250 MCG/5ML IJ SOLN
INTRAMUSCULAR | Status: AC
  Filled 2020-04-12: qty 5

## 2020-04-12 MED ORDER — AMISULPRIDE (ANTIEMETIC) 5 MG/2ML IV SOLN: 10.0000 mg | Freq: Once | INTRAVENOUS | Status: DC | PRN

## 2020-04-12 MED ORDER — EPHEDRINE SULFATE-NACL 50-0.9 MG/10ML-% IV SOSY
PREFILLED_SYRINGE | INTRAVENOUS | Status: DC | PRN
  Administered 2020-04-12 (×5): 10 mg via INTRAVENOUS

## 2020-04-12 MED ORDER — LACTATED RINGERS IV SOLN: INTRAVENOUS | Status: DC

## 2020-04-12 MED ORDER — DEXAMETHASONE SODIUM PHOSPHATE 10 MG/ML IJ SOLN
INTRAMUSCULAR | Status: DC | PRN
  Administered 2020-04-12: 5 mg via INTRAVENOUS

## 2020-04-12 MED ORDER — VANCOMYCIN HCL 1000 MG IV SOLR
INTRAVENOUS | Status: DC | PRN
  Administered 2020-04-12: 1000 mg via TOPICAL

## 2020-04-12 MED ORDER — ALBUMIN HUMAN 5 % IV SOLN: INTRAVENOUS | Status: DC | PRN

## 2020-04-12 MED ORDER — TRANEXAMIC ACID-NACL 1000-0.7 MG/100ML-% IV SOLN
1000.0000 mg | INTRAVENOUS | Status: AC
  Administered 2020-04-12: 1000 mg via INTRAVENOUS
  Filled 2020-04-12: qty 100

## 2020-04-12 MED ORDER — ONDANSETRON HCL 4 MG/2ML IJ SOLN: 4.0000 mg | Freq: Once | INTRAMUSCULAR | Status: DC | PRN

## 2020-04-12 MED ORDER — MIDAZOLAM HCL 2 MG/2ML IJ SOLN
INTRAMUSCULAR | Status: AC
  Administered 2020-04-12: 2 mg via INTRAVENOUS
  Filled 2020-04-12: qty 2

## 2020-04-12 MED ORDER — PROPOFOL 10 MG/ML IV BOLUS
INTRAVENOUS | Status: AC
  Filled 2020-04-12: qty 40

## 2020-04-12 MED ORDER — PROPOFOL 10 MG/ML IV BOLUS
INTRAVENOUS | Status: DC | PRN
  Administered 2020-04-12 (×2): 30 mg via INTRAVENOUS
  Administered 2020-04-12: 130 mg via INTRAVENOUS
  Administered 2020-04-12: 70 mg via INTRAVENOUS

## 2020-04-12 MED ORDER — ORAL CARE MOUTH RINSE: 15.0000 mL | Freq: Once | OROMUCOSAL | Status: AC

## 2020-04-12 MED ORDER — BUPIVACAINE HCL (PF) 0.5 % IJ SOLN
INTRAMUSCULAR | Status: DC | PRN
  Administered 2020-04-12: 15 mL via PERINEURAL

## 2020-04-12 MED ORDER — POVIDONE-IODINE 10 % EX SWAB: 2.0000 "application " | Freq: Once | CUTANEOUS | Status: DC

## 2020-04-12 MED ORDER — GABAPENTIN 300 MG PO CAPS: 300.0000 mg | ORAL_CAPSULE | Freq: Three times a day (TID) | ORAL | 0 refills | Status: DC

## 2020-04-12 MED ORDER — METFORMIN HCL 500 MG PO TABS: 500.0000 mg | ORAL_TABLET | Freq: Every day | ORAL | Status: DC

## 2020-04-12 MED ORDER — MONTELUKAST SODIUM 10 MG PO TABS: 10.0000 mg | ORAL_TABLET | Freq: Every day | ORAL | Status: DC

## 2020-04-12 MED ORDER — OXYCODONE HCL 5 MG PO TABS: 5.0000 mg | ORAL_TABLET | Freq: Once | ORAL | Status: DC | PRN

## 2020-04-12 MED ORDER — OXYCODONE-ACETAMINOPHEN 5-325 MG PO TABS: 1.0000 | ORAL_TABLET | ORAL | 0 refills | Status: DC | PRN

## 2020-04-12 MED ORDER — ROCURONIUM BROMIDE 10 MG/ML (PF) SYRINGE
PREFILLED_SYRINGE | INTRAVENOUS | Status: AC
  Filled 2020-04-12: qty 10

## 2020-04-12 MED ORDER — DEXAMETHASONE SODIUM PHOSPHATE 10 MG/ML IJ SOLN
INTRAMUSCULAR | Status: AC
  Filled 2020-04-12: qty 1

## 2020-04-12 MED ORDER — PHENYLEPHRINE HCL-NACL 10-0.9 MG/250ML-% IV SOLN
INTRAVENOUS | Status: DC | PRN
  Administered 2020-04-12: 40 ug/min via INTRAVENOUS

## 2020-04-12 MED ORDER — ONDANSETRON HCL 4 MG/2ML IJ SOLN
INTRAMUSCULAR | Status: AC
  Filled 2020-04-12: qty 2

## 2020-04-12 MED ORDER — IRRISEPT - 450ML BOTTLE WITH 0.05% CHG IN STERILE WATER, USP 99.95% OPTIME
TOPICAL | Status: DC | PRN
  Administered 2020-04-12: 450 mL via TOPICAL

## 2020-04-12 SURGICAL SUPPLY — 88 items
AID PSTN UNV HD RSTRNT DISP (MISCELLANEOUS) ×1
ALCOHOL 70% 16 OZ (MISCELLANEOUS) ×2 IMPLANT
APL PRP STRL LF DISP 70% ISPRP (MISCELLANEOUS) ×2
BASEPLATE AUG MED W-TAPER (Plate) ×2 IMPLANT
BEARING HUMERAL SHLDER 36M STD (Shoulder) ×1 IMPLANT
BIT DRILL 2.7 W/STOP DISP (BIT) ×2 IMPLANT
BIT DRILL QUICK REL 1/8 2PK SL (DRILL) ×1 IMPLANT
BIT DRILL TWIST 2.7 (BIT) ×2 IMPLANT
BLADE SAW SGTL 13X75X1.27 (BLADE) ×2 IMPLANT
BRNG HUM STD 36 RVRS SHLDR (Shoulder) ×1 IMPLANT
BSPLAT GLND MED AUG TPR ADPR (Plate) ×1 IMPLANT
CHLORAPREP W/TINT 26 (MISCELLANEOUS) ×4 IMPLANT
COOLER ICEMAN CLASSIC (MISCELLANEOUS) ×2 IMPLANT
COVER SURGICAL LIGHT HANDLE (MISCELLANEOUS) ×2 IMPLANT
COVER WAND RF STERILE (DRAPES) IMPLANT
DRAPE INCISE IOBAN 66X45 STRL (DRAPES) ×2 IMPLANT
DRAPE U-SHAPE 47X51 STRL (DRAPES) ×4 IMPLANT
DRILL QUICK RELEASE 1/8 INCH (DRILL) ×1
DRSG AQUACEL AG ADV 3.5X10 (GAUZE/BANDAGES/DRESSINGS) ×2 IMPLANT
ELECT BLADE 4.0 EZ CLEAN MEGAD (MISCELLANEOUS)
ELECT REM PT RETURN 9FT ADLT (ELECTROSURGICAL) ×2
ELECTRODE BLDE 4.0 EZ CLN MEGD (MISCELLANEOUS) IMPLANT
ELECTRODE REM PT RTRN 9FT ADLT (ELECTROSURGICAL) ×1 IMPLANT
GAUZE SPONGE 4X4 12PLY STRL LF (GAUZE/BANDAGES/DRESSINGS) ×2 IMPLANT
GLENOID SPHERE STD STRL 36MM (Orthopedic Implant) ×2 IMPLANT
GLOVE ECLIPSE 7.0 STRL STRAW (GLOVE) ×2 IMPLANT
GLOVE ECLIPSE 8.0 STRL XLNG CF (GLOVE) ×2 IMPLANT
GLOVE SRG 8 PF TXTR STRL LF DI (GLOVE) ×1 IMPLANT
GLOVE SURG UNDER POLY LF SZ7 (GLOVE) ×2 IMPLANT
GLOVE SURG UNDER POLY LF SZ8 (GLOVE) ×2
GOWN STRL REUS W/ TWL LRG LVL3 (GOWN DISPOSABLE) ×2 IMPLANT
GOWN STRL REUS W/ TWL XL LVL3 (GOWN DISPOSABLE) ×1 IMPLANT
GOWN STRL REUS W/TWL LRG LVL3 (GOWN DISPOSABLE) ×4
GOWN STRL REUS W/TWL XL LVL3 (GOWN DISPOSABLE) ×2
GUIDE MODEL REV SHLD LT (ORTHOPEDIC DISPOSABLE SUPPLIES) ×2 IMPLANT
HYDROGEN PEROXIDE 16OZ (MISCELLANEOUS) ×2 IMPLANT
JET LAVAGE IRRISEPT WOUND (IRRIGATION / IRRIGATOR) ×2
KIT BASIN OR (CUSTOM PROCEDURE TRAY) ×2 IMPLANT
KIT TURNOVER KIT B (KITS) ×2 IMPLANT
LAVAGE JET IRRISEPT WOUND (IRRIGATION / IRRIGATOR) ×1 IMPLANT
LOOP VESSEL MAXI BLUE (MISCELLANEOUS) ×2 IMPLANT
MANIFOLD NEPTUNE II (INSTRUMENTS) ×2 IMPLANT
NDL SUT 6 .5 CRC .975X.05 MAYO (NEEDLE) ×1 IMPLANT
NEEDLE HYPO 25GX1X1/2 BEV (NEEDLE) IMPLANT
NEEDLE MAYO TAPER (NEEDLE) ×2
NOZZLE CEMENT SMALL (ORTHOPEDIC DISPOSABLE SUPPLIES) IMPLANT
NS IRRIG 1000ML POUR BTL (IV SOLUTION) ×14 IMPLANT
PACK SHOULDER (CUSTOM PROCEDURE TRAY) ×2 IMPLANT
PAD ARMBOARD 7.5X6 YLW CONV (MISCELLANEOUS) ×4 IMPLANT
PAD COLD SHLDR WRAP-ON (PAD) ×2 IMPLANT
PIN THREADED REVERSE (PIN) ×4 IMPLANT
REAMER GUIDE BUSHING SURG DISP (MISCELLANEOUS) ×2 IMPLANT
REAMER GUIDE W/SCREW AUG (MISCELLANEOUS) ×2 IMPLANT
RESTRAINT HEAD UNIVERSAL NS (MISCELLANEOUS) ×2 IMPLANT
RETRIEVER SUT HEWSON (MISCELLANEOUS) ×2 IMPLANT
SCREW CENTRAL 6.5X20MM (Screw) ×2 IMPLANT
SCREW LOCKING 4.75MMX15MM (Screw) ×2 IMPLANT
SCREW LOCKING STRL 4.75X25X3.5 (Screw) ×6 IMPLANT
SHOULDER HUMERAL BEAR 36M STD (Shoulder) ×2 IMPLANT
SLING ARM IMMOBILIZER LRG (SOFTGOODS) ×2 IMPLANT
SOL PREP POV-IOD 4OZ 10% (MISCELLANEOUS) ×2 IMPLANT
SPONGE LAP 18X18 RF (DISPOSABLE) ×4 IMPLANT
STEM HUMERAL STRL 10MMX55MM (Stem) ×2 IMPLANT
STRIP CLOSURE SKIN 1/2X4 (GAUZE/BANDAGES/DRESSINGS) ×2 IMPLANT
SUCTION FRAZIER HANDLE 10FR (MISCELLANEOUS) ×1
SUCTION TUBE FRAZIER 10FR DISP (MISCELLANEOUS) ×1 IMPLANT
SUT BROADBAND TAPE 2PK 1.5 (SUTURE) ×4 IMPLANT
SUT FIBERWIRE #2 38 T-5 BLUE (SUTURE) ×2
SUT MAXBRAID (SUTURE) IMPLANT
SUT MNCRL AB 3-0 PS2 18 (SUTURE) ×2 IMPLANT
SUT SILK 2 0 TIES 10X30 (SUTURE) ×2 IMPLANT
SUT VIC AB 0 CT1 27 (SUTURE) ×4
SUT VIC AB 0 CT1 27XBRD ANBCTR (SUTURE) ×2 IMPLANT
SUT VIC AB 1 CT1 27 (SUTURE) ×2
SUT VIC AB 1 CT1 27XBRD ANBCTR (SUTURE) ×1 IMPLANT
SUT VIC AB 2-0 CT1 27 (SUTURE) ×8
SUT VIC AB 2-0 CT1 TAPERPNT 27 (SUTURE) ×4 IMPLANT
SUT VICRYL 0 UR6 27IN ABS (SUTURE) ×10 IMPLANT
SUTURE FIBERWR #2 38 T-5 BLUE (SUTURE) ×1 IMPLANT
SUTURE TAPE 1.3 40 TPR END (SUTURE) IMPLANT
SUTURETAPE 1.3 40 TPR END (SUTURE)
SYR CONTROL 10ML LL (SYRINGE) IMPLANT
TOWEL GREEN STERILE (TOWEL DISPOSABLE) ×2 IMPLANT
TOWEL GREEN STERILE FF (TOWEL DISPOSABLE) ×2 IMPLANT
TRAY FOL W/BAG SLVR 16FR STRL (SET/KITS/TRAYS/PACK) IMPLANT
TRAY FOLEY W/BAG SLVR 16FR LF (SET/KITS/TRAYS/PACK)
TRAY HUM REV SHOULDER STD +6 (Shoulder) ×2 IMPLANT
WATER STERILE IRR 1000ML POUR (IV SOLUTION) ×2 IMPLANT

## 2020-04-12 NOTE — Progress Notes (Addendum)
Discharge criteria met. Patient is awake and alert, vital signs stable. Patient reports no pain at this time. Discharge instructions printed and discussed with patient and patietns niece at bedside, both acknowledged understanding of instructions. Copy of instructions provided to patient. Patient assisted with dressing, peripheral IV removed. Patient escorted to lobby by tech via wheelchair, patients family to drive patient home.

## 2020-04-12 NOTE — Brief Op Note (Signed)
   04/12/2020  3:28 PM  PATIENT:  Sherri Gibson  60 y.o. female  PRE-OPERATIVE DIAGNOSIS:  left shoulder osteoarthritis  POST-OPERATIVE DIAGNOSIS:  left shoulder osteoarthritis  PROCEDURE:  Procedure(s): LEFT REVERSE TOTAL SHOULDER ARTHROPLASTY  SURGEON:  Surgeon(s): Meredith Pel, MD  ASSISTANT: nagnant pa  ANESTHESIA:   general  EBL: 100 ml    Total I/O In: 1450 [I.V.:1000; IV Piggyback:450] Out: 100 [Blood:100]  BLOOD ADMINISTERED: none  DRAINS: none   LOCAL MEDICATIONS USED:  vanco  SPECIMEN:  No Specimen  COUNTS:  YES  TOURNIQUET:  * No tourniquets in log *  DICTATION: .Other Dictation: Dictation Number [id number]  PLAN OF CARE: Admit for overnight observation  PATIENT DISPOSITION:  PACU - hemodynamically stable

## 2020-04-12 NOTE — Discharge Instructions (Signed)
General Anesthesia, Adult, Care After This sheet gives you information about how to care for yourself after your procedure. Your health care provider may also give you more specific instructions. If you have problems or questions, contact your health care provider. What can I expect after the procedure? After the procedure, the following side effects are common:  Pain or discomfort at the IV site.  Nausea.  Vomiting.  Sore throat.  Trouble concentrating.  Feeling cold or chills.  Feeling weak or tired.  Sleepiness and fatigue.  Soreness and body aches. These side effects can affect parts of the body that were not involved in surgery. Follow these instructions at home: For the time period you were told by your health care provider:  Rest.  Do not participate in activities where you could fall or become injured.  Do not drive or use machinery.  Do not drink alcohol.  Do not take sleeping pills or medicines that cause drowsiness.  Do not make important decisions or sign legal documents.  Do not take care of children on your own.   Eating and drinking  Follow any instructions from your health care provider about eating or drinking restrictions.  When you feel hungry, start by eating small amounts of foods that are soft and easy to digest (bland), such as toast. Gradually return to your regular diet.  Drink enough fluid to keep your urine pale yellow.  If you vomit, rehydrate by drinking water, juice, or clear broth. General instructions  If you have sleep apnea, surgery and certain medicines can increase your risk for breathing problems. Follow instructions from your health care provider about wearing your sleep device: ? Anytime you are sleeping, including during daytime naps. ? While taking prescription pain medicines, sleeping medicines, or medicines that make you drowsy.  Have a responsible adult stay with you for the time you are told. It is important to have  someone help care for you until you are awake and alert.  Return to your normal activities as told by your health care provider. Ask your health care provider what activities are safe for you.  Take over-the-counter and prescription medicines only as told by your health care provider.  If you smoke, do not smoke without supervision.  Keep all follow-up visits as told by your health care provider. This is important. Contact a health care provider if:  You have nausea or vomiting that does not get better with medicine.  You cannot eat or drink without vomiting.  You have pain that does not get better with medicine.  You are unable to pass urine.  You develop a skin rash.  You have a fever.  You have redness around your IV site that gets worse. Get help right away if:  You have difficulty breathing.  You have chest pain.  You have blood in your urine or stool, or you vomit blood. Summary  After the procedure, it is common to have a sore throat or nausea. It is also common to feel tired.  Have a responsible adult stay with you for the time you are told. It is important to have someone help care for you until you are awake and alert.  When you feel hungry, start by eating small amounts of foods that are soft and easy to digest (bland), such as toast. Gradually return to your regular diet.  Drink enough fluid to keep your urine pale yellow.  Return to your normal activities as told by your health care provider.   Ask your health care provider what activities are safe for you. This information is not intended to replace advice given to you by your health care provider. Make sure you discuss any questions you have with your health care provider. Document Revised: 10/01/2019 Document Reviewed: 04/30/2019 Elsevier Patient Education  2021 [place].   Interscalene Nerve Block, Care After This sheet gives you information about how to care for yourself after your procedure. Your  health care provider may also give you more specific instructions. If you have problems or questions, contact your health care provider. What can I expect after the procedure? After the procedure, it is common to have:  Neck soreness or tenderness.  Numbness in your shoulder, upper arm, and some fingers.  Weakness in your shoulder and arm muscles. You may also have:  Voice changes.  A droopy eyelid.  Trouble swallowing.  A stuffy nose. Feeling and strength in your shoulder, arm, and fingers should return to normal within hours after your procedure or within a few days if you have a long, thin tube (nerve block catheter) in place to deliver medicine for the nerve block. Follow these instructions at home: Medicines  Take over-the-counter and prescription medicines only as told by your health care provider.  For the time period you were told by your health care provider, do not take sleeping pills or medicines that cause drowsiness.  Ask your health care provider if the medicine prescribed to you: ? Requires you to avoid driving or using machinery. ? Can cause constipation. You may need to take these actions to prevent or treat constipation:  Drink enough fluid to keep your urine pale yellow.  Take over-the-counter or prescription medicines.  Eat foods that are high in fiber, such as beans, whole grains, and fresh fruits and vegetables.  Limit foods that are high in fat and processed sugars, such as fried or sweet foods. Eating and drinking  Follow instructions from your health care provider about eating or drinking restrictions.  If you vomit, drink small sips of water, juice, or soup when you can drink without vomiting.  If you have trouble swallowing, take small bites when eating and small sips of water when drinking until you are able to swallow normally.  Make sure you have little or no nausea before eating solid foods. If you have a sling:  Wear it as told by your  health care provider. Remove it only as told by your health care provider.  Loosen it if your fingers tingle, become numb, or turn cold and blue.  Make sure that your entire arm, including your wrist, is supported. Do not let your wrist dangle over the end of the sling.  Keep it clean.  If the sling is not waterproof: ? Do not let it get wet. ? Cover it with a watertight covering when you take a bath or shower. Bathing  Do not take baths, swim, or use a hot tub until your health care provider approves. Ask your health care provider if you may take showers. You may only be allowed to take sponge baths.  If you have a nerve block catheter in place, keep the injection site and tubing dry. Injection site care  Follow instructions from your health care provider about how to take care of your nerve block injection site. Make sure you: ? Wash your hands with soap and water for at least 20 seconds before and after you change your bandage (dressing). If soap and water are not  available, use hand sanitizer. ? Change your dressing as told by your health care provider.  Keep your dressing dry.  Check your injection site every day for signs of infection. Check for: ? Redness, swelling, or pain. ? Fluid or blood. ? Warmth. ? Pus or a bad smell.   Activity  For the time period you were told by your health care provider: ? Do not participate in activities where you could fall or become injured. ? Do not drink alcohol. ? Do not make important decisions or sign legal documents. ? Do not take care of children on your own.  Do not lift anything that is heavier than 10 lb (4.5 kg), or the limit that you are told, until your health care provider says that it is safe.  Do not drive or use machinery until you have fully recovered. Ask your health care provider when it is safe to drive.  Rest as told by your health care provider. This will help you recover faster.  Be very careful until you have  regained strength and feeling.  Return to your normal activities as told by your health care provider. Ask your health care provider what activities are safe for you.   General instructions  Have a responsible adult stay with you until the feeling in your shoulder, arm, and fingers returns to normal.  Do not use any products that contain nicotine or tobacco. These products include cigarettes, chewing tobacco, and vaping devices, such as e-cigarettes. These can delay healing. If you need help quitting, ask your health care provider.  Do not expose your arm or shoulder to very cold or very hot temperatures until you have full feeling back.  If you have a nerve block catheter in place: ? Try to keep the catheter from getting kinked or pinched. ? Avoid pulling or tugging on the catheter.  Keep all follow-up visits. This is important. Contact a health care provider if:  You have a fever or chills.  You have any of these signs of infection: ? Redness, swelling, or pain around your injection site. ? Fluid or blood coming from your injection site. ? Warmth coming from your injection site. ? Pus or a bad smell coming from your injection site.  You have hoarseness, a drooping eye, or dry eye that lasts more than a few days.  You have pain that is poorly controlled with the block or with pain medicine.  You have numbness, tingling, or weakness in your shoulder or arm that lasts for more than 1 week. Get help right away if you:  Have severe pain.  Lose or do not regain strength and feeling in your arm even after the nerve block medicine has stopped.  Have trouble breathing. These symptoms may represent a serious problem that is an emergency. Do not wait to see if the symptoms will go away. Get medical help right away. Call your local emergency services (911 in the [place] Do not drive yourself to the hospital. Summary  After the procedure, it is common to have neck soreness, weakness in  the shoulder and arm muscles, and numbness in the upper arm, shoulder, and some fingers.  Follow instructions from your health care provider about how to take care of your nerve block injection site.  Keep all follow-up visits. This information is not intended to replace advice given to you by your health care provider. Make sure you discuss any questions you have with your health care provider. Document Revised: 07/21/2019 Document  Reviewed: 07/21/2019 Elsevier Patient Education  [place].

## 2020-04-12 NOTE — Anesthesia Postprocedure Evaluation (Signed)
Anesthesia Post Note  Patient: Sherri Gibson  Procedure(s) Performed: LEFT REVERSE TOTAL SHOULDER ARTHROPLASTY (Left Shoulder)     Patient location during evaluation: PACU Anesthesia Type: General Level of consciousness: awake and alert Pain management: pain level controlled Vital Signs Assessment: post-procedure vital signs reviewed and stable Respiratory status: spontaneous breathing, nonlabored ventilation and respiratory function stable Cardiovascular status: blood pressure returned to baseline and stable Postop Assessment: no apparent nausea or vomiting Anesthetic complications: no   No complications documented.  Last Vitals:  Vitals:   04/12/20 1610 04/12/20 1620  BP: 101/77 114/76  Pulse: 88 88  Resp: 17 16  Temp:  36.7 C  SpO2: 98% 98%    Last Pain:  Vitals:   04/12/20 1620  TempSrc:   PainSc: 0-No pain                 Lidia Collum

## 2020-04-12 NOTE — H&P (Signed)
Sherri Gibson is an 60 y.o. female.   Chief Complaint: Left shoulder pain HPI: Sherri Gibson is a 60 y.o. female who presents to the office complaining of bilateral shoulder pain. Patient has a history of bilateral shoulder pain, left greater than right. She had some soreness for several years but she states that she has only "had pain for about 6 months". No injury associated with onset of pain or worsening pain. Pain is waking her up at night. She takes tramadol twice per day and trazodone to help her sleep at night. She has occasional radiation of pain from the shoulder into her bicep on the left side. No radiation into her fingertips. No significant numbness/tingling. She does have diagnosed advanced glenohumeral osteoarthritis on radiographs and is referred by Dr. Erlinda Hong. She notes her mother had a shoulder replacement. She does have a history of breast cancer with lymph node excision on the knee left side and COPD for which she takes oxygen at night. She enjoys gardening. She had bilateral shoulder injection in October 2021 that only helped make the pain more bearable but did not provide significant relief..                  Past Medical History:  Diagnosis Date  . Anemia    as a child  . Arthritis    Both Shoulders  . Breast cancer ([place]) 2016   Left   . Complication of anesthesia    states she had a hard time staying awake after hand surgery  . COPD (chronic obstructive pulmonary disease) (H[facility])    uses O2 at 2l at night  . High blood pressure   . High cholesterol   . History of kidney stones   . Personal history of radiation therapy 2016   Left Breast Cancer  . Pneumonia    as a child  . Prediabetes     Past Surgical History:  Procedure Laterality Date  . BREAST BIOPSY    . BREAST LUMPECTOMY Left 2021  . BREAST LUMPECTOMY WITH AXILLARY LYMPH NODE BIOPSY  2016  . COLONOSCOPY  2017   [facility]. think there was colon polyps  .  HAND SURGERY Left   . PARTIAL HYSTERECTOMY      Family History  Problem Relation Age of Onset  . Breast cancer Mother   . Diabetes Brother   . Colon cancer Neg Hx   . Esophageal cancer Neg Hx    Social History:  reports that she quit smoking about 3 years ago. Her smoking use included cigarettes. She has a 110.00 pack-year smoking history. She has never used smokeless tobacco. She reports previous alcohol use. She reports that she does not use drugs.  Allergies:  Allergies  Allergen Reactions  . Codeine Itching    Feels like something crawling on her skin   . Tetanus Toxoids Swelling    Patient stated,"I was given a Pneumonia shot at the same time I got the Tetanus shot, in the same arm."    Medications Prior to Admission  Medication Sig Dispense Refill  . albuterol (PROAIR HFA) 108 (90 Base) MCG/ACT inhaler inhale 2 puff by inhalation route  every 4 - 6 hours as needed 25.5 g 1  . amLODipine (NORVASC) 10 MG tablet TAKE 1 TABLET DAILY (Patient taking differently: Take 10 mg by mouth at bedtime.) 90 tablet 3  . aspirin 81 MG chewable tablet Chew 81 mg by mouth at bedtime.    Marland Kitchen  atorvastatin (LIPITOR) 40 MG tablet TAKE 1 TABLET DAILY (DISCONTINUE PRAVASTATIN) (Patient taking differently: Take 40 mg by mouth daily.) 90 tablet 3  . Calcium-Magnesium-Vitamin D (CALCIUM 1200+D3 PO) Take 600 mg by mouth 2 (two) times daily.    . cyclobenzaprine (FLEXERIL) 10 MG tablet TAKE 1 TABLET THREE TIMES A DAY AS NEEDED FOR MUSCLE SPASMS (Patient taking differently: Take 10 mg by mouth daily.) 90 tablet 3  . loratadine (CLARITIN) 10 MG tablet TAKE 1 TABLET BY MOUTH EVERY DAY (Patient taking differently: Take 10 mg by mouth at bedtime.) 90 tablet 1  . losartan (COZAAR) 50 MG tablet TAKE 1 TABLET DAILY (Patient taking differently: Take 50 mg by mouth at bedtime.) 90 tablet 3  . metFORMIN (GLUCOPHAGE) 500 MG tablet TAKE 1 TABLET DAILY WITH BREAKFAST (NEED A DIABETES MELLITUS CHECK) (Patient taking  differently: Take 500 mg by mouth daily with breakfast.) 90 tablet 3  . montelukast (SINGULAIR) 10 MG tablet TAKE 1 TABLET AT BEDTIME (Patient taking differently: Take 10 mg by mouth at bedtime.) 90 tablet 3  . Multiple Vitamins-Minerals (MULTIVITAMIN WITH MINERALS) tablet Take 1 tablet by mouth daily.    . OXYGEN Inhale 2 L into the lungs at bedtime.    Marland Kitchen SPIRIVA RESPIMAT 2.5 MCG/ACT AERS USE 2 INHALATIONS DAILY (Patient taking differently: Inhale 2 puffs into the lungs daily.) 12 g 3  . SYMBICORT 160-4.5 MCG/ACT inhaler USE 2 INHALATIONS TWICE A DAY (Patient taking differently: Inhale 2 puffs into the lungs 2 (two) times daily.) 30.6 g 3  . traMADol (ULTRAM) 50 MG tablet TAKE 1-2 TABLETS BY MOUTH EVERY 8 HOURS AS NEEDED FOR MODERATE PAIN. MAX OF 6 TABLETS PER DAY 90 tablet 0  . traZODone (DESYREL) 50 MG tablet TAKE 1 TABLET DAILY AT BEDTIME (Patient taking differently: Take 50 mg by mouth at bedtime.) 90 tablet 3  . albuterol (PROVENTIL) (2.5 MG/3ML) 0.083% nebulizer solution Take 3 mLs (2.5 mg total) by nebulization every 2 (two) hours as needed for wheezing or shortness of breath. 75 mL 99  . betamethasone dipropionate (DIPROLENE) 0.05 % cream Apply topically 2 (two) times daily. To affected area(s) as needed (Patient not taking: Reported on 04/05/2020) 15 g 1  . MITIGARE 0.6 MG CAPS TAKE 1 CAPSULE DAILY (Patient not taking: Reported on 04/05/2020) 90 capsule 3  . ondansetron (ZOFRAN) 4 MG tablet Take 1 tablet (4 mg total) by mouth every 6 (six) hours as needed for nausea or vomiting (use for nausea with colonoscopy prep). (Patient not taking: Reported on 04/05/2020) 20 tablet 0  . predniSONE (DELTASONE) 20 MG tablet Take 1 tablet (20 mg total) by mouth 2 (two) times daily with a meal. (Patient not taking: Reported on 04/05/2020) 10 tablet 0    Results for orders placed or performed during the hospital encounter of 04/12/20 (from the past 48 hour(s))  Glucose, capillary     Status: None   Collection  Time: 04/12/20  8:49 AM  Result Value Ref Range   Glucose-Capillary 95 70 - 99 mg/dL    Comment: Glucose reference range applies only to samples taken after fasting for at least 8 hours.  Basic metabolic panel     Status: None   Collection Time: 04/12/20  9:11 AM  Result Value Ref Range   Sodium 140 135 - 145 mmol/L   Potassium 3.8 3.5 - 5.1 mmol/L   Chloride 104 98 - 111 mmol/L   CO2 26 22 - 32 mmol/L   Glucose, Bld 88 70 - 99  mg/dL    Comment: Glucose reference range applies only to samples taken after fasting for at least 8 hours.   BUN 12 6 - 20 mg/dL   Creatinine, Ser 0.75 0.44 - 1.00 mg/dL   Calcium 9.4 8.9 - 10.3 mg/dL   GFR, Estimated >60 >60 mL/min    Comment: (NOTE) Calculated using the CKD-EPI Creatinine Equation (2021)    Anion gap 10 5 - 15    Comment: Performed at [facility] [address], [place], [place] [address]  CBC     Status: None   Collection Time: 04/12/20  9:11 AM  Result Value Ref Range   WBC 6.6 4.0 - 10.5 K/uL   RBC 4.63 3.87 - 5.11 MIL/uL   Hemoglobin 13.2 12.0 - 15.0 g/dL   HCT 41.7 36.0 - 46.0 %   MCV 90.1 80.0 - 100.0 fL   MCH 28.5 26.0 - 34.0 pg   MCHC 31.7 30.0 - 36.0 g/dL   RDW 13.2 11.5 - 15.5 %   Platelets 364 150 - 400 K/uL   nRBC 0.0 0.0 - 0.2 %    Comment: Performed at [facility], [place] [address], [place], [place] [address]  Urinalysis, Routine w reflex microscopic     Status: Abnormal   Collection Time: 04/12/20  9:14 AM  Result Value Ref Range   Color, Urine YELLOW YELLOW   APPearance CLEAR CLEAR   Specific Gravity, Urine 1.015 1.005 - 1.030   pH 5.0 5.0 - 8.0   Glucose, UA NEGATIVE NEGATIVE mg/dL   Hgb urine dipstick NEGATIVE NEGATIVE   Bilirubin Urine NEGATIVE NEGATIVE   Ketones, ur NEGATIVE NEGATIVE mg/dL   Protein, ur NEGATIVE NEGATIVE mg/dL   Nitrite NEGATIVE NEGATIVE   Leukocytes,Ua SMALL (A) NEGATIVE   RBC / HPF 0-5 0 - 5 RBC/hpf   WBC, UA 0-5 0 - 5 WBC/hpf   Bacteria, UA NONE SEEN NONE SEEN    Squamous Epithelial / LPF 0-5 0 - 5   Mucus PRESENT     Comment: Performed at [facility], [place] [address] [place], [place] [address]   No results found.  Review of Systems  Musculoskeletal: Positive for arthralgias.  All other systems reviewed and are negative.   Blood pressure 115/72, pulse 85, temperature 98.4 F (36.9 C), temperature source Oral, resp. rate 15, height 4\' 11"  (1.499 m), weight 54.4 kg, SpO2 96 %. Physical Exam Vitals reviewed.  HENT:     Head: Normocephalic.     Nose: Nose normal.     Mouth/Throat:     Mouth: Mucous membranes are moist.  Eyes:     Pupils: Pupils are equal, round, and reactive to light.  Cardiovascular:     Rate and Rhythm: Normal rate.     Pulses: Normal pulses.  Pulmonary:     Effort: Pulmonary effort is normal.  Abdominal:     General: Abdomen is flat.  Musculoskeletal:     Cervical back: Normal range of motion.  Skin:    General: Skin is warm.     Capillary Refill: Capillary refill takes less than 2 seconds.  Neurological:     General: No focal deficit present.     Mental Status: She is alert.  Psychiatric:        Mood and Affect: Mood normal.     Ortho exam demonstrates functional deltoid.  She does have pretty reasonable subscap strength at 5 out of 5 compared to 5+ out of 5 on  the right.  Some of this is limited by pain.  External rotation strength also 5 out of 5.  Supraspinatus strength difficult to assess due to pain but seems intact.  Deltoid is functional.  Motor or sensory function to the hand is intact.  Radial pulse intact.  Passive range of motion is only about 45 degrees of forward flexion passively 50 degrees of abduction passively and external rotation only to about 20 degrees. Assessment/Plan   Impression is left shoulder avascular necrosis and significant pain with limitation of function.  CT scan performed for preop patient specific instrumentation and planning.  Rotator cuff looks viable on the scan as well.   She does have slight subscap weakness but I think that is more related to pain than true functional deficit.  Tentative plan at this time is for total shoulder replacement.  May consider using convertible glenoid if the patient is size appropriate for that option.  Stemless it versus micro stem also a consideration for this patient.  Risk and benefits of surgery are discussed including but not limited to infection nerve vessel damage incomplete healing incomplete restoration of function as well as potential need for revision in her lifetime is all discussed.  Based on her functional limitations and pain she is willing to accept those risk for the chance at an improved quality of life. Anderson Malta, MD 04/12/2020, 11:00 AM

## 2020-04-12 NOTE — Transfer of Care (Signed)
Immediate Anesthesia Transfer of Care Note  Patient: Sherri Gibson [place]  Procedure(s) Performed: LEFT REVERSE TOTAL SHOULDER ARTHROPLASTY (Left Shoulder)  Patient Location: PACU  Anesthesia Type:General  Level of Consciousness: awake, alert  and oriented  Airway & Oxygen Therapy: Patient Spontanous Breathing and Patient connected to nasal cannula oxygen  Post-op Assessment: Report given to RN and Post -op Vital signs reviewed and stable  Post vital signs: Reviewed and stable  Last Vitals:  Vitals Value Taken Time  BP 101/66 04/12/20 1520  Temp    Pulse 88 04/12/20 1522  Resp 17 04/12/20 1522  SpO2 93 % 04/12/20 1522  Vitals shown include unvalidated device data.  Last Pain:  Vitals:   04/12/20 1040  TempSrc:   PainSc: 0-No pain         Complications: No complications documented.

## 2020-04-12 NOTE — Progress Notes (Signed)
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  This is slightly more than can be corrected efficiently by the augmented baseplate.  Because of the significance of the glenoid deformity we will likely go with reverse shoulder replacement.  Rotator cuff strength intact supraspinatus infraspinatus slightly weak on subscap.  I think based on the glenoid deformity her best option for implant with longevity will be to be reverse replacement.  We were able to achieve 95% baseplate in contact with large augmented baseplate.  This was not possible with templating for the total shoulder replacement.

## 2020-04-12 NOTE — Anesthesia Preprocedure Evaluation (Addendum)
Anesthesia Evaluation  Patient identified by MRN, date of birth, ID band Patient awake    Reviewed: Allergy & Precautions, NPO status , Patient's Chart, lab work & pertinent test results  History of Anesthesia Complications Negative for: history of anesthetic complications  Airway Mallampati: II  TM Distance: >3 FB Neck ROM: Full    Dental  (+) Edentulous Upper, Edentulous Lower   Pulmonary COPD (nighttime O2),  oxygen dependent, former smoker,    Pulmonary exam normal        Cardiovascular hypertension, Normal cardiovascular exam     Neuro/Psych negative neurological ROS  negative psych ROS   GI/Hepatic negative GI ROS, Neg liver ROS,   Endo/Other  negative endocrine ROS  Renal/GU negative Renal ROS  negative genitourinary   Musculoskeletal  (+) Arthritis ,   Abdominal   Peds  Hematology negative hematology ROS (+)   Anesthesia Other Findings  H/o breast cancer  Reproductive/Obstetrics                            Anesthesia Physical Anesthesia Plan  ASA: III  Anesthesia Plan: General   Post-op Pain Management: GA combined w/ Regional for post-op pain   Induction: Intravenous  PONV Risk Score and Plan: 3 and Ondansetron, Dexamethasone, Treatment may vary due to age or medical condition and Midazolam  Airway Management Planned: Oral ETT  Additional Equipment: None  Intra-op Plan:   Post-operative Plan: Extubation in OR  Informed Consent: I have reviewed the patients History and Physical, chart, labs and discussed the procedure including the risks, benefits and alternatives for the proposed anesthesia with the patient or authorized representative who has indicated his/her understanding and acceptance.     Dental advisory given  Plan Discussed with:   Anesthesia Plan Comments:         Anesthesia Quick Evaluation

## 2020-04-12 NOTE — Anesthesia Procedure Notes (Signed)
Procedure Name: Intubation Date/Time: 04/12/2020 11:44 AM Performed by: Janene Harvey, CRNA Pre-anesthesia Checklist: Patient identified, Emergency Drugs available, Suction available and Patient being monitored Patient Re-evaluated:Patient Re-evaluated prior to induction Oxygen Delivery Method: Circle system utilized Preoxygenation: Pre-oxygenation with 100% oxygen Induction Type: IV induction Ventilation: Mask ventilation without difficulty Laryngoscope Size: Mac and 3 Grade View: Grade I Tube type: Oral Tube size: 6.5 mm Number of attempts: 1 Airway Equipment and Method: Stylet and Oral airway Placement Confirmation: ETT inserted through vocal cords under direct vision,  positive ETCO2 and breath sounds checked- equal and bilateral Secured at: 20 cm Tube secured with: Tape Dental Injury: Teeth and Oropharynx as per pre-operative assessment

## 2020-04-12 NOTE — Anesthesia Procedure Notes (Signed)
Anesthesia Regional Block: Interscalene brachial plexus block   Pre-Anesthetic Checklist: ,, timeout performed, Correct Patient, Correct Site, Correct Laterality, Correct Procedure, Correct Position, site marked, Risks and benefits discussed,  Surgical consent,  Pre-op evaluation,  At surgeon's request and post-op pain management  Laterality: Left  Prep: chloraprep       Needles:  Injection technique: Single-shot  Needle Type: Echogenic Stimulator Needle     Needle Length: 10cm  Needle Gauge: 20     Additional Needles:   Procedures:,,,, ultrasound used (permanent image in chart),,,,  Narrative:  Start time: 04/12/2020 10:25 AM End time: 04/12/2020 10:31 AM Injection made incrementally with aspirations every 5 mL.  Performed by: Personally  Anesthesiologist: Lidia Collum, MD  Additional Notes: Standard monitors applied. Skin prepped. Good needle visualization with ultrasound. Injection made in 5cc increments with no resistance to injection. Patient tolerated the procedure well.

## 2020-04-12 NOTE — Op Note (Signed)
NAME: Sherri Gibson, MARSAN MEDICAL RECORD NO: [id number] ACCOUNT NO: [id number] DATE OF BIRTH: May 18, 1960 FACILITY: [facility] LOCATION: [facility] PHYSICIAN: Yetta Barre. 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  PROCEDURE:  Left reverse shoulder replacement using Biomet comprehensive reverse shoulder system, medium augmented baseplate with 36 standard glenosphere 120 mm central compression screw and 4 peripheral locking screws with size 10 micro primary shoulder  stem with +6 taper offset, 40 mm diameter, mini humeral tray and standard 36 mm diameter bearing.  SURGEON ATTENDING:  Yetta Barre. 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  She has significant  glenoid deformity, necessitating reverse shoulder replacement as opposed to standard anatomic augment and glenoid shoulder replacement.  DESCRIPTION OF PROCEDURE:  The patient was brought to the operating room where general endotracheal anesthesia was induced.  Preoperative antibiotics were administered.  Timeout was called.  Left shoulder prescrubbed with hydrogen peroxide and alcohol,  then Betadine, which was allowed to air dry.  Prepped with DuraPrep solution and draped in a sterile manner.  Benzoyl peroxide was utilized 3 days prior to the procedure.  The patient was placed in the beach chair position with the head in neutral  position.  After sterile and then the shoulder was then prepped with ChloraPrep solution and draped in sterile manner.  Ioban used to cover the operative field.  A timeout was called.  Incision made from the clavicle over  the coracoid down 2 cm lateral  to the axillary crease and about 2 cm below the superior portion of the axillary crease.  Skin and subcutaneous tissue were sharply divided.  Cephalic vein mobilized medially.  Deltopectoral interval developed.  Pec tendon was released upper 1.5 cm.   Subdeltoid adhesions were released.  Subacromial adhesions were released.  Biceps tendon was tenodesed to the pec tendon and the rotator interval was then opened up to the base of the coracoid.  Next, the patient had generally intact rotator cuff,  although there was some tendinosis, both at the subscap attachment and the supraspinatus attachment.  The circumflex vessels were ligated.  The axillary nerve was identified and a vessel loop placed around it and protected at all times during the case.   Next, the subscap was peeled off of the lesser tuberosity.  The capsular dissection was performed down to the 7 o'clock position and as well as about 2 cm inferior to the humeral neck bluntly.  Kolbel retractor was placed for this portion of the case.   The shoulder was dislocated.  The Browne retractor was then placed.  The proximal reaming was performed up to size 12.  Head was resected in 30 degrees of retroversion at the rotator cuff attachment site.  Rotator cuff was preserved.  Broaching performed  up to size 10, which gave a very nice fit in the proximal compact bone.  This was allowed to sit in place and a cap was placed.  Attention was then directed towards the glenoid.  Circumferential labral excision was performed with care being taken to  avoid injury to the axillary nerve.  Next, the Bankart lesion created from the 12 o'clock to 6 o'clock position.  [place]  was mobilized.  Next, posterior retractor and anterior retractor was placed.  The original plan was for 0 degrees of inclination,  but we added about 5-6 degrees of inclination by dropping hand using the patient-specific instrumentation.  Next, reaming was performed on  the glenoid to about the depth of 6-7 mm.  This is in accordance with preoperative templating.  Next, posterior  reaming was performed in order to gain 95% contact.  This was achieved.  The baseplate was then placed into position with excellent contact achieved.  Central screw was placed with very good purchase obtained followed by 4 peripheral locking screws.   Next, the spacing between the glenoid baseplate and the humerus was evaluated.  Based on the competence of the rotator cuff, a 36 standard was chosen.  Glenosphere trial was placed and a +6 offset tray with standard polyethylene insert was then placed  and the patient had very good stability and range of motion.  The shoulder was again dislocated with some effort and the true glenosphere was placed onto the glenosphere.  The true stem was placed after drilling holes into the lesser tuberosity for  subscap repair.  Same stability parameters were maintained with the +6 offset standard humeral tray.  The patient had excellent range of motion, very good stability with adduction and forward flexion, internal and external rotation was good.  The patient  could achieve forward flexion, abduction both above 90 degrees.  Thorough irrigation was then performed.  IrriSept solution used after the incision as well as after had multiple points in the case.  The subscap was then repaired with the arm in about 30  degrees of external rotation.  This was done using 4 suture tapes and Nice knots.  At the conclusion of the case external rotation was about 45 degrees with a solid endpoint.  A thorough irrigation again performed, Vancomycin powder placed within the  joint as well as outside and on top of the subscap repair.  Rotator interval was also closed using #1 Vicryl suture.  This was closed with the arm in about 30 degrees of external rotation.  Next, the deltopectoral interval was closed using #1 Vicryl  suture, followed by interrupted inverted 0 Vicryl suture,  2-0 Vicryl suture, and 3-0 Monocryl.  Steri-Strips and Aquacel dressing applied.  Luke's assistance was required for opening, closing, mobilization of tissue.  His assistance was a medical  necessity.  The patient tolerated the procedure well without immediate complication and transferred to the recovery room in stable condition.   PUS D: 04/12/2020 3:37:49 pm T: 04/12/2020 4:34:00 pm  JOB: [id number]/ [id number]

## 2020-04-13 ENCOUNTER — Encounter (HOSPITAL_COMMUNITY): Payer: Self-pay | Admitting: Orthopedic Surgery

## 2020-04-13 ENCOUNTER — Telehealth: Payer: Self-pay

## 2020-04-13 LAB — URINE CULTURE: Culture: NO GROWTH

## 2020-04-13 NOTE — Telephone Encounter (Signed)
IC back. I spoke with Ruby [employer]. He will get arranged today or tomorrow. If patient has not heard from them by tomorrow afternoon, she will reach out to me.  They were waiting for patient to be discharged home.

## 2020-04-13 NOTE — Telephone Encounter (Signed)
Patient called she stated she had surgery yesterday and she hasn't received the machine for her shoulder or received any calls call back:[phone]

## 2020-04-13 NOTE — Telephone Encounter (Signed)
Pts husband called again regarding the message below

## 2020-04-14 ENCOUNTER — Other Ambulatory Visit: Payer: Self-pay

## 2020-04-14 ENCOUNTER — Emergency Department (HOSPITAL_BASED_OUTPATIENT_CLINIC_OR_DEPARTMENT_OTHER)
Admission: EM | Admit: 2020-04-14 | Discharge: 2020-04-14 | Disposition: A | Discharge status: Home / Self Care | Payer: BC Managed Care – PPO | Attending: Emergency Medicine | Admitting: Emergency Medicine

## 2020-04-14 ENCOUNTER — Encounter (HOSPITAL_BASED_OUTPATIENT_CLINIC_OR_DEPARTMENT_OTHER): Payer: Self-pay | Admitting: *Deleted

## 2020-04-14 DIAGNOSIS — Z9889 Other specified postprocedural states: Secondary | ICD-10-CM | POA: Insufficient documentation

## 2020-04-14 DIAGNOSIS — Z4801 Encounter for change or removal of surgical wound dressing: Secondary | ICD-10-CM | POA: Insufficient documentation

## 2020-04-14 DIAGNOSIS — E7800 Pure hypercholesterolemia, unspecified: Secondary | ICD-10-CM | POA: Diagnosis not present

## 2020-04-14 DIAGNOSIS — R7303 Prediabetes: Secondary | ICD-10-CM | POA: Diagnosis not present

## 2020-04-14 DIAGNOSIS — Z853 Personal history of malignant neoplasm of breast: Secondary | ICD-10-CM | POA: Insufficient documentation

## 2020-04-14 DIAGNOSIS — J449 Chronic obstructive pulmonary disease, unspecified: Secondary | ICD-10-CM | POA: Diagnosis not present

## 2020-04-14 DIAGNOSIS — I10 Essential (primary) hypertension: Secondary | ICD-10-CM | POA: Insufficient documentation

## 2020-04-14 DIAGNOSIS — Z8701 Personal history of pneumonia (recurrent): Secondary | ICD-10-CM | POA: Diagnosis not present

## 2020-04-14 DIAGNOSIS — Z9981 Dependence on supplemental oxygen: Secondary | ICD-10-CM | POA: Diagnosis not present

## 2020-04-14 DIAGNOSIS — Z87442 Personal history of urinary calculi: Secondary | ICD-10-CM | POA: Diagnosis not present

## 2020-04-14 DIAGNOSIS — Z4889 Encounter for other specified surgical aftercare: Secondary | ICD-10-CM

## 2020-04-14 DIAGNOSIS — Z87891 Personal history of nicotine dependence: Secondary | ICD-10-CM | POA: Diagnosis not present

## 2020-04-14 DIAGNOSIS — D649 Anemia, unspecified: Secondary | ICD-10-CM | POA: Diagnosis not present

## 2020-04-14 DIAGNOSIS — Z79899 Other long term (current) drug therapy: Secondary | ICD-10-CM | POA: Insufficient documentation

## 2020-04-14 DIAGNOSIS — Z7951 Long term (current) use of inhaled steroids: Secondary | ICD-10-CM | POA: Diagnosis not present

## 2020-04-14 DIAGNOSIS — L7622 Postprocedural hemorrhage and hematoma of skin and subcutaneous tissue following other procedure: Secondary | ICD-10-CM | POA: Diagnosis not present

## 2020-04-14 DIAGNOSIS — M19012 Primary osteoarthritis, left shoulder: Secondary | ICD-10-CM | POA: Diagnosis not present

## 2020-04-14 DIAGNOSIS — Z4731 Aftercare following explantation of shoulder joint prosthesis: Secondary | ICD-10-CM | POA: Diagnosis not present

## 2020-04-14 DIAGNOSIS — Z471 Aftercare following joint replacement surgery: Secondary | ICD-10-CM | POA: Diagnosis not present

## 2020-04-14 DIAGNOSIS — Z7984 Long term (current) use of oral hypoglycemic drugs: Secondary | ICD-10-CM | POA: Diagnosis not present

## 2020-04-14 DIAGNOSIS — M19011 Primary osteoarthritis, right shoulder: Secondary | ICD-10-CM | POA: Diagnosis not present

## 2020-04-14 NOTE — Discharge Instructions (Signed)
Like we discussed, if you develop worsening symptoms overnight please come back to the emergency department.  Otherwise, please follow-up with Dr. Marlou Sa with orthopedic surgery tomorrow.  It was a pleasure to meet you both.

## 2020-04-14 NOTE — ED Provider Notes (Signed)
[facility] EMERGENCY DEPARTMENT Provider Note   CSN: [id number] Arrival date & time: 04/14/20  2041     History Chief Complaint  Patient presents with  . Post-op Problem    Sherri Gibson is a 60 y.o. female.  HPI Who presents to the emergency department for a wound check.  Patient had a left reverse total arthroplasty of the shoulder, 2 days ago by Dr. Marlou Sa with orthopedic surgery.  She states that this afternoon she was doing range of motion work with the shoulder as prescribed and noticed that she was bleeding from the site.  Her husband states that she soaked the side of her t shirt.  They noticed an additional episode of bleeding later as well.  This is since resolved.  They were concerned so they came to the emergency department for evaluation.  No other complaints at this time.    Past Medical History:  Diagnosis Date  . Anemia    as a child  . Arthritis    Both Shoulders  . Breast cancer ([place]) 2016   Left   . Complication of anesthesia    states she had a hard time staying awake after hand surgery  . COPD (chronic obstructive pulmonary disease) (H[facility])    uses O2 at 2l at night  . High blood pressure   . High cholesterol   . History of kidney stones   . Personal history of radiation therapy 2016   Left Breast Cancer  . Pneumonia    as a child  . Prediabetes     Patient Active Problem List   Diagnosis Date Noted  . 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 Encounter for screening for lung cancer 04/13/2019  . Primary osteoarthritis of both shoulders 03/06/2019  . Chronic hypoxemic respiratory failure ([place]) 12/22/2018  . Osteopenia 12/20/2017  . Prediabetes 09/23/2017  . Colonoscopy refused 05/29/2017  . COPD, severe ([place]) 05/29/2017  . Multiple pulmonary nodules 05/29/2017  . Trigger thumb, right thumb 05/29/2017  . Primary osteoarthritis of first carpometacarpal joint of left hand 05/29/2017  . Kidney lesion 12/14/2016  .  Erythrocytosis 11/13/2016  . Breast cancer ([place]) 09/20/2016  . Essential hypertension 09/20/2016  . Insomnia 09/20/2016  . Mixed hyperlipidemia 09/20/2016    Past Surgical History:  Procedure Laterality Date  . BREAST BIOPSY    . BREAST LUMPECTOMY Left 2021  . BREAST LUMPECTOMY WITH AXILLARY LYMPH NODE BIOPSY  2016  . COLONOSCOPY  2017   [facility] [place] [facility]. think there was colon polyps  . HAND SURGERY Left   . PARTIAL HYSTERECTOMY    . TOTAL SHOULDER ARTHROPLASTY Left 04/12/2020   Procedure: LEFT REVERSE TOTAL SHOULDER ARTHROPLASTY;  Surgeon: Meredith Pel, MD;  Location: [facility];  Service: Orthopedics;  Laterality: Left;     OB History   No obstetric history on file.     Family History  Problem Relation Age of Onset  . Breast cancer Mother   . Diabetes Brother   . Colon cancer Neg Hx   . Esophageal cancer Neg Hx     Social History   Tobacco Use  . Smoking status: Former Smoker    Packs/day: 2.50    Years: 44.00    Pack years: 110.00    Types: Cigarettes    Quit date: 11/29/2016    Years since quitting: 3.3  . Smokeless tobacco: Never Used  Vaping Use  . Vaping Use: Never used  Substance Use  Topics  . Alcohol use: Not Currently  . Drug use: Never    Home Medications Prior to Admission medications   Medication Sig Start Date End Date Taking? Authorizing Provider  albuterol (PROAIR HFA) 108 (90 Base) MCG/ACT inhaler inhale 2 puff by inhalation route  every 4 - 6 hours as needed 08/31/19   Margaretha Seeds, MD  albuterol (PROVENTIL) (2.5 MG/3ML) 0.083% nebulizer solution Take 3 mLs (2.5 mg total) by nebulization every 2 (two) hours as needed for wheezing or shortness of breath. 07/31/18   Emeterio Reeve, DO  amLODipine (NORVASC) 10 MG tablet TAKE 1 TABLET DAILY Patient taking differently: Take 10 mg by mouth at bedtime. 08/17/19   Emeterio Reeve, DO  atorvastatin (LIPITOR) 40 MG tablet TAKE 1 TABLET DAILY (DISCONTINUE  PRAVASTATIN) Patient taking differently: Take 40 mg by mouth daily. 11/02/19   Emeterio Reeve, DO  Calcium-Magnesium-Vitamin D (CALCIUM 1200+D3 PO) Take 600 mg by mouth 2 (two) times daily.    [provider]  cyclobenzaprine (FLEXERIL) 10 MG tablet TAKE 1 TABLET THREE TIMES A DAY AS NEEDED FOR MUSCLE SPASMS Patient taking differently: Take 10 mg by mouth daily. 02/15/20   Emeterio Reeve, DO  gabapentin (NEURONTIN) 300 MG capsule Take 1 capsule (300 mg total) by mouth 3 (three) times daily. 04/12/20 04/12/21  Magnant, Charles L, PA-C  ketorolac (TORADOL) 10 MG tablet Take 1 tablet (10 mg total) by mouth every 8 (eight) hours as needed. 04/12/20   Magnant, Charles L, PA-C  loratadine (CLARITIN) 10 MG tablet TAKE 1 TABLET BY MOUTH EVERY DAY Patient taking differently: Take 10 mg by mouth at bedtime. 10/27/19   Emeterio Reeve, DO  losartan (COZAAR) 50 MG tablet TAKE 1 TABLET DAILY Patient taking differently: Take 50 mg by mouth at bedtime. 10/26/19   Emeterio Reeve, DO  metFORMIN (GLUCOPHAGE) 500 MG tablet TAKE 1 TABLET DAILY WITH BREAKFAST (NEED A DIABETES MELLITUS CHECK) Patient taking differently: Take 500 mg by mouth daily with breakfast. 02/26/20   Emeterio Reeve, DO  montelukast (SINGULAIR) 10 MG tablet TAKE 1 TABLET AT BEDTIME Patient taking differently: Take 10 mg by mouth at bedtime. 10/13/19   Emeterio Reeve, DO  Multiple Vitamins-Minerals (MULTIVITAMIN WITH MINERALS) tablet Take 1 tablet by mouth daily.    [provider]  ondansetron (ZOFRAN) 4 MG tablet Take 1 tablet (4 mg total) by mouth every 6 (six) hours as needed for nausea or vomiting (use for nausea with colonoscopy prep). Patient not taking: Reported on 04/05/2020 02/03/20   Cirigliano, Dominic Pea, DO  oxyCODONE-acetaminophen (PERCOCET) 5-325 MG tablet Take 1 tablet by mouth every 4 (four) hours as needed for severe pain. 04/12/20 04/12/21  Magnant, Charles L, PA-C  OXYGEN Inhale 2 L into the lungs at  bedtime.    [provider]  SPIRIVA RESPIMAT 2.5 MCG/ACT AERS USE 2 INHALATIONS DAILY Patient taking differently: Inhale 2 puffs into the lungs daily. 02/12/20   Margaretha Seeds, MD  SYMBICORT 160-4.5 MCG/ACT inhaler USE 2 INHALATIONS TWICE A DAY Patient taking differently: Inhale 2 puffs into the lungs 2 (two) times daily. 11/17/19   Margaretha Seeds, MD    Allergies    Codeine and Tetanus toxoids  Review of Systems   Review of Systems  Musculoskeletal: Negative for arthralgias and myalgias.  Skin: Positive for color change and wound.    Physical Exam Updated Vital Signs BP 120/74 (BP Location: Right Arm)   Pulse (!) 113   Temp 98.9 F (37.2 C) (Oral)  Resp 16   Ht 4\' 11"  (1.499 m)   Wt 54.4 kg   SpO2 94%   BMI 24.24 kg/m   Physical Exam Vitals and nursing note reviewed.  Constitutional:      General: She is not in acute distress.    Appearance: She is well-developed.  HENT:     Head: Normocephalic and atraumatic.     Right Ear: External ear normal.     Left Ear: External ear normal.  Eyes:     General: No scleral icterus.       Right eye: No discharge.        Left eye: No discharge.     Conjunctiva/sclera: Conjunctivae normal.  Neck:     Trachea: No tracheal deviation.  Cardiovascular:     Rate and Rhythm: Normal rate.  Pulmonary:     Effort: Pulmonary effort is normal. No respiratory distress.     Breath sounds: No stridor.  Abdominal:     General: There is no distension.  Musculoskeletal:        General: No swelling or deformity.     Cervical back: Neck supple.  Skin:    General: Skin is warm and dry.     Findings: No rash.     Comments: Occlusive bandage noted to the left upper extremity.  Bandage appears mostly soaked in blood.  No visible active hemorrhaging.  No blood draining from the bandage.  Neurological:     Mental Status: She is alert.     Cranial Nerves: Cranial nerve deficit: no gross deficits.    ED Results / Procedures /  Treatments   Labs (all labs ordered are listed, but only abnormal results are displayed) Labs Reviewed - No data to display  EKG None  Radiology No results found.  Procedures Procedures   Medications Ordered in ED Medications - No data to display  ED Course  I have reviewed the triage vital signs and the nursing notes.  Pertinent labs & imaging results that were available during my care of the patient were reviewed by me and considered in my medical decision making (see chart for details).    MDM Rules/Calculators/A&P                          Patient presents today for evaluation of a wound to her left arm.  She had surgery on the left shoulder 2 days ago by Dr. Marlou Sa.  She had an episode of bleeding while performing range of motion exercises with the shoulder.  I left patient's occlusive bandage in place but did not see any active hemorrhaging or blood draining from the bandage.  I believe that it is likely resolved.  I also think that this likely resulted from her range of motion exercises this afternoon, which likely disrupted clotting at the incision site.   I gave she and her husband strict return precautions and requested that they come back to the emergency department if the patient starts bleeding again.  Otherwise, they are going to follow-up with her orthopedic surgeon first thing in the morning to be evaluated in his office.  Their questions were answered and they were amicable at the time of discharge.  Final Clinical Impression(s) / ED Diagnoses Final diagnoses:  Encounter for post surgical wound check    Rx / DC Orders ED Discharge Orders    None       Rayna Sexton, PA-C 04/14/20 2155    Zackowski,  Nicki Reaper, MD 04/20/20 [phone]

## 2020-04-14 NOTE — ED Notes (Signed)
Pt. Waiting to see EDP or PAC for complaint at this time.  Pt. Reports using her machine for ROM today after shoulder surgery on the L shoulder.  Pt. In no pain at present time.  Pt. States she had a lot of blood after taking a nap and using the ROM machine today.

## 2020-04-14 NOTE — ED Triage Notes (Signed)
She had left shoulder surgery 2 days ago. She had bright red blood from the incision site earlier today. Dried blood noted at the site at triage. Sling in place.

## 2020-04-15 ENCOUNTER — Telehealth: Payer: Self-pay | Admitting: Orthopedic Surgery

## 2020-04-15 ENCOUNTER — Encounter: Payer: Self-pay | Admitting: Orthopedic Surgery

## 2020-04-15 NOTE — Telephone Encounter (Signed)
Dc cpm over the weekend thx

## 2020-04-15 NOTE — Telephone Encounter (Signed)
Tried calling. Greeting stating number not in service.  Did send patient mychart message earlier.

## 2020-04-15 NOTE — Telephone Encounter (Signed)
Pt called and states she woke up bleeding from where she had surgery! She went to the hospital and she was calling to let Dr.Dean know. She thinks it was from the PTM machine. Please give her a call she has a few concerns about if she should use the machine or not.

## 2020-04-18 ENCOUNTER — Encounter: Payer: Self-pay | Admitting: Orthopedic Surgery

## 2020-04-18 NOTE — Telephone Encounter (Signed)
Normal for swelling distal in the arm, wouldn't worry about it.  Okay to bend at the elbow and use a stressball the squeeze the hand which can help with swelling.  Okay for sleeping pill as long as she is not taking any opioid medication.  Can you see if she has had any further bleeding from the incision or see if she can take a picture of the incision?

## 2020-04-19 DIAGNOSIS — Z7951 Long term (current) use of inhaled steroids: Secondary | ICD-10-CM | POA: Diagnosis not present

## 2020-04-19 DIAGNOSIS — J449 Chronic obstructive pulmonary disease, unspecified: Secondary | ICD-10-CM | POA: Diagnosis not present

## 2020-04-19 DIAGNOSIS — M19011 Primary osteoarthritis, right shoulder: Secondary | ICD-10-CM | POA: Diagnosis not present

## 2020-04-19 DIAGNOSIS — Z9981 Dependence on supplemental oxygen: Secondary | ICD-10-CM | POA: Diagnosis not present

## 2020-04-19 DIAGNOSIS — Z8701 Personal history of pneumonia (recurrent): Secondary | ICD-10-CM | POA: Diagnosis not present

## 2020-04-19 DIAGNOSIS — I10 Essential (primary) hypertension: Secondary | ICD-10-CM | POA: Diagnosis not present

## 2020-04-19 DIAGNOSIS — Z87891 Personal history of nicotine dependence: Secondary | ICD-10-CM | POA: Diagnosis not present

## 2020-04-19 DIAGNOSIS — Z87442 Personal history of urinary calculi: Secondary | ICD-10-CM | POA: Diagnosis not present

## 2020-04-19 DIAGNOSIS — R7303 Prediabetes: Secondary | ICD-10-CM | POA: Diagnosis not present

## 2020-04-19 DIAGNOSIS — Z7984 Long term (current) use of oral hypoglycemic drugs: Secondary | ICD-10-CM | POA: Diagnosis not present

## 2020-04-19 DIAGNOSIS — E7800 Pure hypercholesterolemia, unspecified: Secondary | ICD-10-CM | POA: Diagnosis not present

## 2020-04-19 DIAGNOSIS — D649 Anemia, unspecified: Secondary | ICD-10-CM | POA: Diagnosis not present

## 2020-04-19 DIAGNOSIS — Z471 Aftercare following joint replacement surgery: Secondary | ICD-10-CM | POA: Diagnosis not present

## 2020-04-19 DIAGNOSIS — Z853 Personal history of malignant neoplasm of breast: Secondary | ICD-10-CM | POA: Diagnosis not present

## 2020-04-19 DIAGNOSIS — Z4731 Aftercare following explantation of shoulder joint prosthesis: Secondary | ICD-10-CM | POA: Diagnosis not present

## 2020-04-19 NOTE — Telephone Encounter (Signed)
Okay to go past 90 as long as she has no additional discomfort

## 2020-04-19 NOTE — Discharge Summary (Signed)
Physician Discharge Summary      Patient ID: [name] MRN: [id number] DOB/AGE: 60-Mar-1962 60 y.o.  Admit date: 04/12/2020 Discharge date: 04/12/2020  Admission Diagnoses:  Active Problems:   S/P reverse total shoulder arthroplasty, left   Discharge Diagnoses:  Same  Surgeries: Procedure(s): LEFT REVERSE TOTAL SHOULDER ARTHROPLASTY on 04/12/2020   Consultants:   Discharged Condition: Stable  Hospital Course: Sherri Gibson is an 60 y.o. female who was admitted 04/12/2020 with a chief complaint of left shoulder pain, and found to have a diagnosis of severe left shoulder osteoarthritis..  They were brought to the operating room on 04/12/2020 and underwent the above named procedures.  Pt awoke from anesthesia without complication and was transferred to the PACU.  She was planned to be transferred to the floor but patient requested discharge home.  She was discharged home with oral dose of antibiotics to take that evening.  Pt will f/u with Dr. Marlou Sa in clinic in ~2 weeks.   Antibiotics given:  Anti-infectives (From admission, onward)   Start     Dose/Rate Route Frequency Ordered Stop   04/12/20 1352  vancomycin (VANCOCIN) powder  Status:  Discontinued          As needed 04/12/20 1352 04/12/20 1516   04/12/20 0845  ceFAZolin (ANCEF) IVPB 2g/100 mL premix        2 g 200 mL/hr over 30 Minutes Intravenous On call to O.R. 04/12/20 0831 04/12/20 1201   04/12/20 0000  cephALEXin (KEFLEX) 500 MG capsule        1,000 mg Oral  Once 04/12/20 1623 04/12/20 2359    .  Recent vital signs:  Vitals:   04/12/20 1610 04/12/20 1620  BP: 101/77 114/76  Pulse: 88 88  Resp: 17 16  Temp:  98 F (36.7 C)  SpO2: 98% 98%    Recent laboratory studies:  Results for orders placed or performed during the hospital encounter of 04/12/20  Urine culture   Specimen: Urine, Clean Catch  Result Value Ref Range   Specimen Description URINE, CLEAN CATCH    Special Requests NONE    Culture       NO GROWTH Performed at [facility], [place] [address], [place], [place] [address]    Report Status 04/13/2020 FINAL   Surgical pcr screen   Specimen: Nasal Mucosa; Nasal Swab  Result Value Ref Range   MRSA, PCR NEGATIVE NEGATIVE   Staphylococcus aureus NEGATIVE NEGATIVE  Basic metabolic panel  Result Value Ref Range   Sodium 140 135 - 145 mmol/L   Potassium 3.8 3.5 - 5.1 mmol/L   Chloride 104 98 - 111 mmol/L   CO2 26 22 - 32 mmol/L   Glucose, Bld 88 70 - 99 mg/dL   BUN 12 6 - 20 mg/dL   Creatinine, Ser 0.75 0.44 - 1.00 mg/dL   Calcium 9.4 8.9 - 10.3 mg/dL   GFR, Estimated >60 >60 mL/min   Anion gap 10 5 - 15  CBC  Result Value Ref Range   WBC 6.6 4.0 - 10.5 K/uL   RBC 4.63 3.87 - 5.11 MIL/uL   Hemoglobin 13.2 12.0 - 15.0 g/dL   HCT 41.7 36.0 - 46.0 %   MCV 90.1 80.0 - 100.0 fL   MCH 28.5 26.0 - 34.0 pg   MCHC 31.7 30.0 - 36.0 g/dL   RDW 13.2 11.5 - 15.5 %   Platelets 364 150 - 400 K/uL   nRBC 0.0 0.0 - 0.2 %  Urinalysis, Routine w reflex microscopic  Result Value Ref Range   Color, Urine YELLOW YELLOW   APPearance CLEAR CLEAR   Specific Gravity, Urine 1.015 1.005 - 1.030   pH 5.0 5.0 - 8.0   Glucose, UA NEGATIVE NEGATIVE mg/dL   Hgb urine dipstick NEGATIVE NEGATIVE   Bilirubin Urine NEGATIVE NEGATIVE   Ketones, ur NEGATIVE NEGATIVE mg/dL   Protein, ur NEGATIVE NEGATIVE mg/dL   Nitrite NEGATIVE NEGATIVE   Leukocytes,Ua SMALL (A) NEGATIVE   RBC / HPF 0-5 0 - 5 RBC/hpf   WBC, UA 0-5 0 - 5 WBC/hpf   Bacteria, UA NONE SEEN NONE SEEN   Squamous Epithelial / LPF 0-5 0 - 5   Mucus PRESENT   Glucose, capillary  Result Value Ref Range   Glucose-Capillary 95 70 - 99 mg/dL    Discharge Medications:   Allergies as of 04/12/2020      Reactions   Codeine Itching   Feels like something crawling on her skin   Tetanus Toxoids Swelling   Patient stated,"I was given a Pneumonia shot at the same time I got the Tetanus shot, in the same arm."      Medication List     STOP taking these medications   aspirin 81 MG chewable tablet   betamethasone dipropionate 0.05 % cream   Mitigare 0.6 MG Caps Generic drug: Colchicine   predniSONE 20 MG tablet Commonly known as: DELTASONE   traMADol 50 MG tablet Commonly known as: ULTRAM   traZODone 50 MG tablet Commonly known as: DESYREL     TAKE these medications   albuterol (2.5 MG/3ML) 0.083% nebulizer solution Commonly known as: PROVENTIL Take 3 mLs (2.5 mg total) by nebulization every 2 (two) hours as needed for wheezing or shortness of breath.   albuterol 108 (90 Base) MCG/ACT inhaler Commonly known as: ProAir HFA inhale 2 puff by inhalation route  every 4 - 6 hours as needed   amLODipine 10 MG tablet Commonly known as: NORVASC TAKE 1 TABLET DAILY What changed: when to take this   atorvastatin 40 MG tablet Commonly known as: LIPITOR TAKE 1 TABLET DAILY (DISCONTINUE PRAVASTATIN) What changed: See the new instructions.   CALCIUM 1200+D3 PO Take 600 mg by mouth 2 (two) times daily.   cyclobenzaprine 10 MG tablet Commonly known as: FLEXERIL TAKE 1 TABLET THREE TIMES A DAY AS NEEDED FOR MUSCLE SPASMS What changed: See the new instructions.   gabapentin 300 MG capsule Commonly known as: Neurontin Take 1 capsule (300 mg total) by mouth 3 (three) times daily.   ketorolac 10 MG tablet Commonly known as: TORADOL Take 1 tablet (10 mg total) by mouth every 8 (eight) hours as needed.   loratadine 10 MG tablet Commonly known as: CLARITIN TAKE 1 TABLET BY MOUTH EVERY DAY What changed: when to take this   losartan 50 MG tablet Commonly known as: COZAAR TAKE 1 TABLET DAILY What changed: when to take this   metFORMIN 500 MG tablet Commonly known as: GLUCOPHAGE TAKE 1 TABLET DAILY WITH BREAKFAST (NEED A DIABETES MELLITUS CHECK) What changed: See the new instructions.   montelukast 10 MG tablet Commonly known as: SINGULAIR TAKE 1 TABLET AT BEDTIME   multivitamin with minerals  tablet Take 1 tablet by mouth daily.   ondansetron 4 MG tablet Commonly known as: Zofran Take 1 tablet (4 mg total) by mouth every 6 (six) hours as needed for nausea or vomiting (use for nausea with colonoscopy prep).   oxyCODONE-acetaminophen 5-325 MG tablet Commonly  known as: Percocet Take 1 tablet by mouth every 4 (four) hours as needed for severe pain.   OXYGEN Inhale 2 L into the lungs at bedtime.   Spiriva Respimat 2.5 MCG/ACT Aers Generic drug: Tiotropium Bromide Monohydrate USE 2 INHALATIONS DAILY What changed: See the new instructions.   Symbicort 160-4.5 MCG/ACT inhaler Generic drug: budesonide-formoterol USE 2 INHALATIONS TWICE A DAY What changed: See the new instructions.     ASK your doctor about these medications   cephALEXin 500 MG capsule Commonly known as: KEFLEX Take 2 capsules (1,000 mg total) by mouth once for 1 dose. Ask about: Should I take this medication?       Diagnostic Studies: DG Shoulder Left Port  Result Date: 04/12/2020 CLINICAL DATA:  Status post left shoulder replacement today. EXAM: LEFT SHOULDER COMPARISON:  Plain films left shoulder 01/05/2020. FINDINGS: New reverse arthroplasty is in place. The device is located. No fracture. Gas in the soft tissues from surgery noted. IMPRESSION: Status post left shoulder arthroplasty.  No acute finding. Electronically Signed   By: Inge Rise M.D.   On: 04/12/2020 16:02   MM 3D SCREEN BREAST BILATERAL  Result Date: 04/08/2020 CLINICAL DATA:  Screening. EXAM: DIGITAL SCREENING BILATERAL MAMMOGRAM WITH TOMOSYNTHESIS AND CAD TECHNIQUE: Bilateral screening digital craniocaudal and mediolateral oblique mammograms were obtained. Bilateral screening digital breast tomosynthesis was performed. The images were evaluated with computer-aided detection. COMPARISON:  Previous exam(s). ACR Breast Density Category c: The breast tissue is heterogeneously dense, which may obscure small masses. FINDINGS: There are no  findings suspicious for malignancy. The images were evaluated with computer-aided detection. IMPRESSION: No mammographic evidence of malignancy. A result letter of this screening mammogram will be mailed directly to the patient. RECOMMENDATION: Screening mammogram in one year. (Code:SM-B-01Y) BI-RADS CATEGORY  1: Negative. Electronically Signed   By: Lovey Newcomer M.D.   On: 04/08/2020 14:10    Disposition: Discharge disposition: 01-Home or Self Care       Discharge Instructions    Call MD / Call 911   Complete by: As directed    If you experience chest pain or shortness of breath, CALL 911 and be transported to the hospital emergency room.  If you develope a fever above 101 F, pus (white drainage) or increased drainage or redness at the wound, or calf pain, call your surgeon's office.   Constipation Prevention   Complete by: As directed    Drink plenty of fluids.  Prune juice may be helpful.  You may use a stool softener, such as Colace (over the counter) 100 mg twice a day.  Use MiraLax (over the counter) for constipation as needed.   Diet - low sodium heart healthy   Complete by: As directed    Discharge instructions   Complete by: As directed    Take the 2 pills of antibiotics this evening with dinner (Keflex).  Block normally wears off tomorrow afternoon.  Begin taking pain medication prior to block wearing off to avoid pain hitting at once.  You may shower, dressing is waterproof.  Do not bathe or soak the operative shoulder in a tub, pool.  Use the CPM machine 3 times a day for one hour each time.  Increase the degrees of range of motion every day. Use ice machine after CPM for best effect, do not use ice for longer than 30 mins at a time.  No lifting with the operative shoulder. Continue use of the sling.  Follow-up with Dr. Marlou Sa in ~2 weeks on  your given appointment date.  We will remove your adhesive bandage at that time.  Call the office with any questions or concerns.  Dental  Antibiotics:  In most cases prophylactic antibiotics for Dental procdeures after total joint surgery are not necessary.  Exceptions are as follows:  1. History of prior total joint infection  2. Severely immunocompromised (Organ Transplant, cancer chemotherapy, Rheumatoid biologic meds such as [place])  3. Poorly controlled diabetes (A1C &gt; 8.0, blood glucose over 200)  If you have one of these conditions, contact your surgeon for an antibiotic prescription, prior to your dental procedure.   Increase activity slowly as tolerated   Complete by: As directed          Signed: Donella Stade 04/19/2020, 10:43 AM

## 2020-04-20 DIAGNOSIS — M19012 Primary osteoarthritis, left shoulder: Secondary | ICD-10-CM

## 2020-04-21 DIAGNOSIS — R7303 Prediabetes: Secondary | ICD-10-CM | POA: Diagnosis not present

## 2020-04-21 DIAGNOSIS — Z9981 Dependence on supplemental oxygen: Secondary | ICD-10-CM | POA: Diagnosis not present

## 2020-04-21 DIAGNOSIS — Z87442 Personal history of urinary calculi: Secondary | ICD-10-CM | POA: Diagnosis not present

## 2020-04-21 DIAGNOSIS — Z4731 Aftercare following explantation of shoulder joint prosthesis: Secondary | ICD-10-CM | POA: Diagnosis not present

## 2020-04-21 DIAGNOSIS — Z853 Personal history of malignant neoplasm of breast: Secondary | ICD-10-CM | POA: Diagnosis not present

## 2020-04-21 DIAGNOSIS — Z7984 Long term (current) use of oral hypoglycemic drugs: Secondary | ICD-10-CM | POA: Diagnosis not present

## 2020-04-21 DIAGNOSIS — Z471 Aftercare following joint replacement surgery: Secondary | ICD-10-CM | POA: Diagnosis not present

## 2020-04-21 DIAGNOSIS — I10 Essential (primary) hypertension: Secondary | ICD-10-CM | POA: Diagnosis not present

## 2020-04-21 DIAGNOSIS — M19011 Primary osteoarthritis, right shoulder: Secondary | ICD-10-CM | POA: Diagnosis not present

## 2020-04-21 DIAGNOSIS — Z8701 Personal history of pneumonia (recurrent): Secondary | ICD-10-CM | POA: Diagnosis not present

## 2020-04-21 DIAGNOSIS — D649 Anemia, unspecified: Secondary | ICD-10-CM | POA: Diagnosis not present

## 2020-04-21 DIAGNOSIS — J449 Chronic obstructive pulmonary disease, unspecified: Secondary | ICD-10-CM | POA: Diagnosis not present

## 2020-04-21 DIAGNOSIS — E7800 Pure hypercholesterolemia, unspecified: Secondary | ICD-10-CM | POA: Diagnosis not present

## 2020-04-21 DIAGNOSIS — Z87891 Personal history of nicotine dependence: Secondary | ICD-10-CM | POA: Diagnosis not present

## 2020-04-21 DIAGNOSIS — Z7951 Long term (current) use of inhaled steroids: Secondary | ICD-10-CM | POA: Diagnosis not present

## 2020-04-25 DIAGNOSIS — J449 Chronic obstructive pulmonary disease, unspecified: Secondary | ICD-10-CM | POA: Diagnosis not present

## 2020-04-26 DIAGNOSIS — I10 Essential (primary) hypertension: Secondary | ICD-10-CM | POA: Diagnosis not present

## 2020-04-26 DIAGNOSIS — Z8701 Personal history of pneumonia (recurrent): Secondary | ICD-10-CM | POA: Diagnosis not present

## 2020-04-26 DIAGNOSIS — Z9981 Dependence on supplemental oxygen: Secondary | ICD-10-CM | POA: Diagnosis not present

## 2020-04-26 DIAGNOSIS — E7800 Pure hypercholesterolemia, unspecified: Secondary | ICD-10-CM | POA: Diagnosis not present

## 2020-04-26 DIAGNOSIS — Z87891 Personal history of nicotine dependence: Secondary | ICD-10-CM | POA: Diagnosis not present

## 2020-04-26 DIAGNOSIS — R7303 Prediabetes: Secondary | ICD-10-CM | POA: Diagnosis not present

## 2020-04-26 DIAGNOSIS — Z7984 Long term (current) use of oral hypoglycemic drugs: Secondary | ICD-10-CM | POA: Diagnosis not present

## 2020-04-26 DIAGNOSIS — Z4731 Aftercare following explantation of shoulder joint prosthesis: Secondary | ICD-10-CM | POA: Diagnosis not present

## 2020-04-26 DIAGNOSIS — M19011 Primary osteoarthritis, right shoulder: Secondary | ICD-10-CM | POA: Diagnosis not present

## 2020-04-26 DIAGNOSIS — D649 Anemia, unspecified: Secondary | ICD-10-CM | POA: Diagnosis not present

## 2020-04-26 DIAGNOSIS — Z471 Aftercare following joint replacement surgery: Secondary | ICD-10-CM | POA: Diagnosis not present

## 2020-04-26 DIAGNOSIS — Z87442 Personal history of urinary calculi: Secondary | ICD-10-CM | POA: Diagnosis not present

## 2020-04-26 DIAGNOSIS — Z853 Personal history of malignant neoplasm of breast: Secondary | ICD-10-CM | POA: Diagnosis not present

## 2020-04-26 DIAGNOSIS — J449 Chronic obstructive pulmonary disease, unspecified: Secondary | ICD-10-CM | POA: Diagnosis not present

## 2020-04-26 DIAGNOSIS — Z7951 Long term (current) use of inhaled steroids: Secondary | ICD-10-CM | POA: Diagnosis not present

## 2020-04-27 ENCOUNTER — Encounter: Payer: Self-pay | Admitting: Orthopedic Surgery

## 2020-04-27 ENCOUNTER — Other Ambulatory Visit: Payer: Self-pay

## 2020-04-27 ENCOUNTER — Ambulatory Visit (INDEPENDENT_AMBULATORY_CARE_PROVIDER_SITE_OTHER): Payer: BC Managed Care – PPO

## 2020-04-27 ENCOUNTER — Ambulatory Visit (INDEPENDENT_AMBULATORY_CARE_PROVIDER_SITE_OTHER): Payer: BC Managed Care – PPO | Admitting: Orthopedic Surgery

## 2020-04-27 DIAGNOSIS — Z96612 Presence of left artificial shoulder joint: Secondary | ICD-10-CM | POA: Diagnosis not present

## 2020-04-27 NOTE — Progress Notes (Signed)
Post-Op Visit Note   Patient: Sherri Gibson           Date of Birth: [birth year]           MRN: [id number] Visit Date: 04/27/2020 PCP: Emeterio Reeve, DO   Assessment & Plan:  Chief Complaint:  Chief Complaint  Patient presents with  . Left Shoulder - Routine Post Op   Visit Diagnoses:  1. History of arthroplasty of left shoulder     Plan: Patient is a 60 year old female who presents s/p left shoulder reverse shoulder arthroplasty on 04/12/2020.  She reports that she is doing very well and denies any significant pain.  She has not had to take the oxycodone at all since discharge from the hospital.  She did have 1 instance of bleeding from the incision after initially using her CPM machine and she went to the ED for this.  Bleeding stopped and she has had no further instances since that event.  On exam she has incision that is healing well with no evidence of infection or dehiscence.  She has 20 degrees external rotation, 65 degrees abduction, 105 degrees forward flexion passively.  Axillary nerve intact with deltoid firing.  Subscap with decent strength at this time.  Radiographs show left reverse shoulder prosthesis in excellent position with no complicating features.  Plan to start patient in physical therapy with restriction of no external rotation past 30 degrees and no significant strengthening until 6 weeks out from surgery to protect the subscapularis repair.  Follow-up in 4 weeks for clinical recheck with Dr. Marlou Sa.  Discontinue the sling at this time but advised patient to avoid any lifting more than a couple pounds.  Follow-Up Instructions: No follow-ups on file.   Orders:  Orders Placed This Encounter  Procedures  . XR Shoulder Left  . Ambulatory referral to Physical Therapy   No orders of the defined types were placed in this encounter.   Imaging: No results found.  PMFS History: Patient Active Problem List   Diagnosis Date Noted  . Arthritis of left  shoulder region   . 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 Encounter for screening for lung cancer 04/13/2019  . Primary osteoarthritis of both shoulders 03/06/2019  . Chronic hypoxemic respiratory failure ([place]) 12/22/2018  . Osteopenia 12/20/2017  . Prediabetes 09/23/2017  . Colonoscopy refused 05/29/2017  . COPD, severe ([place]) 05/29/2017  . Multiple pulmonary nodules 05/29/2017  . Trigger thumb, right thumb 05/29/2017  . Primary osteoarthritis of first carpometacarpal joint of left hand 05/29/2017  . Kidney lesion 12/14/2016  . Erythrocytosis 11/13/2016  . Breast cancer ([place]) 09/20/2016  . Essential hypertension 09/20/2016  . Insomnia 09/20/2016  . Mixed hyperlipidemia 09/20/2016   Past Medical History:  Diagnosis Date  . Anemia    as a child  . Arthritis    Both Shoulders  . Breast cancer ([place]) 2016   Left   . Complication of anesthesia    states she had a hard time staying awake after hand surgery  . COPD (chronic obstructive pulmonary disease) ([facility])    uses O2 at 2l at night  . High blood pressure   . High cholesterol   . History of kidney stones   . Personal history of radiation therapy 2016   Left Breast Cancer  . Pneumonia    as a child  . Prediabetes     Family History  Problem Relation Age of Onset  . Breast cancer Mother   . Diabetes  Brother   . Colon cancer Neg Hx   . Esophageal cancer Neg Hx     Past Surgical History:  Procedure Laterality Date  . BREAST BIOPSY    . BREAST LUMPECTOMY Left 2021  . BREAST LUMPECTOMY WITH AXILLARY LYMPH NODE BIOPSY  2016  . COLONOSCOPY  2017   [facility]. think there was colon polyps  . HAND SURGERY Left   . PARTIAL HYSTERECTOMY    . TOTAL SHOULDER ARTHROPLASTY Left 04/12/2020   Procedure: LEFT REVERSE TOTAL SHOULDER ARTHROPLASTY;  Surgeon: Meredith Pel, MD;  Location: [facility];  Service: Orthopedics;  Laterality: Left;   Social History    Occupational History  . Not on file  Tobacco Use  . Smoking status: Former Smoker    Packs/day: 2.50    Years: 44.00    Pack years: 110.00    Types: Cigarettes    Quit date: 11/29/2016    Years since quitting: 3.4  . Smokeless tobacco: Never Used  Vaping Use  . Vaping Use: Never used  Substance and Sexual Activity  . Alcohol use: Not Currently  . Drug use: Never  . Sexual activity: Yes    Partners: Male    Birth control/protection: None

## 2020-04-28 DIAGNOSIS — Z87442 Personal history of urinary calculi: Secondary | ICD-10-CM | POA: Diagnosis not present

## 2020-04-28 DIAGNOSIS — J449 Chronic obstructive pulmonary disease, unspecified: Secondary | ICD-10-CM | POA: Diagnosis not present

## 2020-04-28 DIAGNOSIS — I10 Essential (primary) hypertension: Secondary | ICD-10-CM | POA: Diagnosis not present

## 2020-04-28 DIAGNOSIS — Z9981 Dependence on supplemental oxygen: Secondary | ICD-10-CM | POA: Diagnosis not present

## 2020-04-28 DIAGNOSIS — Z8701 Personal history of pneumonia (recurrent): Secondary | ICD-10-CM | POA: Diagnosis not present

## 2020-04-28 DIAGNOSIS — M19011 Primary osteoarthritis, right shoulder: Secondary | ICD-10-CM | POA: Diagnosis not present

## 2020-04-28 DIAGNOSIS — Z471 Aftercare following joint replacement surgery: Secondary | ICD-10-CM | POA: Diagnosis not present

## 2020-04-28 DIAGNOSIS — D649 Anemia, unspecified: Secondary | ICD-10-CM | POA: Diagnosis not present

## 2020-04-28 DIAGNOSIS — Z87891 Personal history of nicotine dependence: Secondary | ICD-10-CM | POA: Diagnosis not present

## 2020-04-28 DIAGNOSIS — Z7984 Long term (current) use of oral hypoglycemic drugs: Secondary | ICD-10-CM | POA: Diagnosis not present

## 2020-04-28 DIAGNOSIS — Z853 Personal history of malignant neoplasm of breast: Secondary | ICD-10-CM | POA: Diagnosis not present

## 2020-04-28 DIAGNOSIS — Z4731 Aftercare following explantation of shoulder joint prosthesis: Secondary | ICD-10-CM | POA: Diagnosis not present

## 2020-04-28 DIAGNOSIS — Z7951 Long term (current) use of inhaled steroids: Secondary | ICD-10-CM | POA: Diagnosis not present

## 2020-04-28 DIAGNOSIS — R7303 Prediabetes: Secondary | ICD-10-CM | POA: Diagnosis not present

## 2020-04-28 DIAGNOSIS — E7800 Pure hypercholesterolemia, unspecified: Secondary | ICD-10-CM | POA: Diagnosis not present

## 2020-04-29 ENCOUNTER — Other Ambulatory Visit: Payer: Self-pay | Admitting: Pulmonary Disease

## 2020-05-02 ENCOUNTER — Other Ambulatory Visit: Payer: Self-pay

## 2020-05-02 ENCOUNTER — Ambulatory Visit (INDEPENDENT_AMBULATORY_CARE_PROVIDER_SITE_OTHER): Payer: BC Managed Care – PPO | Admitting: Rehabilitative and Restorative Service Providers"

## 2020-05-02 DIAGNOSIS — M6281 Muscle weakness (generalized): Secondary | ICD-10-CM | POA: Diagnosis not present

## 2020-05-02 DIAGNOSIS — R600 Localized edema: Secondary | ICD-10-CM

## 2020-05-02 DIAGNOSIS — M25512 Pain in left shoulder: Secondary | ICD-10-CM

## 2020-05-02 DIAGNOSIS — R293 Abnormal posture: Secondary | ICD-10-CM | POA: Diagnosis not present

## 2020-05-02 NOTE — Patient Instructions (Signed)
Access Code: [email or web address] URL: [email or web address] Date: 05/02/2020 Prepared by: Rudell Cobb  Exercises Circular Shoulder Pendulum with Table Support - 2 x daily - 7 x weekly - 1 sets - 10 reps Flexion-Extension Shoulder Pendulum with Table Support - 2 x daily - 7 x weekly - 1 sets - 10 reps Seated Scapular Retraction - 2 x daily - 7 x weekly - 1 sets - 10 reps Seated Elbow Flexion and Extension AROM - 2 x daily - 7 x weekly - 1 sets - 10 reps Seated Forearm Pronation and Supination AROM - 2 x daily - 7 x weekly - 1 sets - 10 reps Seated Shoulder Flexion Towel Slide at Table Top - 2 x daily - 7 x weekly - 1 sets - 10 reps Supine Cervical Rotation AROM on Pillow - 2 x daily - 7 x weekly - 1 sets - 10 reps Supine Chin Tuck - 2 x daily - 7 x weekly - 1 sets - 10 reps  Patient Education Scar Massage

## 2020-05-02 NOTE — Therapy (Signed)
[address] [place], [place], [address] Phone: [phone]   Fax:  [phone]  Physical Therapy Evaluation  Patient Details  Name: Sherri Gibson MRN: [id number] Date of Birth: 1960-08-27 Referring Provider (PT): Gloriann Loan, [place]   Encounter Date: 05/02/2020   PT End of Session - 05/02/20 1042    Visit Number 1    Number of Visits 16    Date for PT Re-Evaluation 07/01/20    PT Start Time 0923    PT Stop Time 1010    PT Time Calculation (min) 47 min    Activity Tolerance Patient tolerated treatment well    Behavior During Therapy [facility] for tasks assessed/performed           Past Medical History:  Diagnosis Date  . Anemia    as a child  . Arthritis    Both Shoulders  . Breast cancer ([place]) 2016   Left   . Complication of anesthesia    states she had a hard time staying awake after hand surgery  . COPD (chronic obstructive pulmonary disease) (HCC)    uses O2 at 2l at night  . High blood pressure   . High cholesterol   . History of kidney stones   . Personal history of radiation therapy 2016   Left Breast Cancer  . Pneumonia    as a child  . Prediabetes     Past Surgical History:  Procedure Laterality Date  . BREAST BIOPSY    . BREAST LUMPECTOMY Left 2021  . BREAST LUMPECTOMY WITH AXILLARY LYMPH NODE BIOPSY  2016  . COLONOSCOPY  2017   [facility]. think there was colon polyps  . HAND SURGERY Left   . PARTIAL HYSTERECTOMY    . TOTAL SHOULDER ARTHROPLASTY Left 04/12/2020   Procedure: LEFT REVERSE TOTAL SHOULDER ARTHROPLASTY;  Surgeon: Meredith Pel, MD;  Location: [place];  Service: Orthopedics;  Laterality: Left;    There were no vitals filed for this visit.    Subjective Assessment - 05/02/20 0927    Subjective The patient reports years of L shoulder pain due to arthritis.  She underwent L shoulder reverse total arthroplasty on 04/12/20.   She had a home CPM machine.    Pertinent History HTN, arthritis, h/o L breast CA with lumpectomy, diabetes.    Patient Stated Goals Before my surgery, I couldn't lift my arm at all.  I'd like to be able to reach something on the top shelf.    Currently in Pain? No/denies   "Virtually no pain the whole time."             [facility] PT Assessment - 05/02/20 0929      Assessment   Medical Diagnosis L reverse total shoulder arthroplasty    Referring Provider (PT) Gloriann Loan, PA    Onset Date/Surgical Date 04/12/20    Hand Dominance Right      Precautions   Precautions Shoulder    Type of Shoulder Precautions Reverse total shoulder    Precaution Comments No ER>30 degrees, no strengthening until 6 weeks post (04/23/20), do not lift>2 lbs.      Restrictions   Weight Bearing Restrictions Yes      Balance Screen   Has the patient fallen in the past 6 months No    Has the patient had a decrease in activity level because of a fear of falling?  No    Is the  patient reluctant to leave their home because of a fear of falling?  No      Home [employer] residence    Living Arrangements Spouse/significant other    Additional Comments Spouse undergoing chemotherapy for lung cancer      Prior Function   Level of Independence Independent with household mobility with device   was unable to reach overhead, could lift hand to beside her head (with pain)     Observation/Other Assessments   Focus on Therapeutic Outcomes (FOTO)  30% functional status score      Sensation   Light Touch Appears Intact      Posture/Postural Control   Posture/Postural Control Postural limitations    Postural Limitations Rounded Shoulders;Forward head      ROM / Strength   AROM / PROM / Strength PROM      PROM   Overall PROM  Deficits    PROM Assessment Site Shoulder    Right/Left Shoulder Left;Right    Left Shoulder Flexion 105 Degrees    Left Shoulder Internal Rotation --   to  neutral in scapular plan   Left Shoulder External Rotation 25 Degrees      Palpation   Palpation comment tenderness to palpation along rhomboids and upper trap; scar tissue is tight anterior shoulder                      Objective measurements completed on examination: See above findings.       [facility] Adult PT Treatment/Exercise - 05/02/20 0929      Self-Care   Self-Care Other Self-Care Comments    Other Self-Care Comments  scar tissue mobilization      Exercises   Exercises Shoulder      Shoulder Exercises: Supine   External Rotation AAROM;Left;5 reps    External Rotation Limitations *didn't give for home b/c patient tends to push to 30 degrees in supine and think she will go >30 degrees (which is limit per PA's last office note)    Flexion AAROM;Left;5 reps    Flexion Limitations cane chest press within tolerable motion *R shoulder painful, therefore moved to seated table slide      Shoulder Exercises: Seated   Retraction AROM;Both;5 reps    Flexion AAROM;Left;10 reps    Flexion Limitations table slide in pain free range of motion    Other Seated Exercises elbow flexion AROM and elbow pronation/supination AROM                  PT Education - 05/02/20 1042    Education Details HEP    Person(s) Educated Patient    Methods Explanation;Demonstration;Handout    Comprehension Verbalized understanding;Returned demonstration            PT Short Term Goals - 05/02/20 1043      PT SHORT TERM GOAL #1   Title The patient will be indep with initial HEP.    Time 4    Period Weeks    Target Date 06/01/20      PT SHORT TERM GOAL #2   Title The patient will improve L shoulder P/ROM to 130 degrees.    Baseline 105 degrees.    Time 4    Period Weeks    Target Date 06/01/20      PT SHORT TERM GOAL #3   Title The patient will demonstrate reaching to shoulder height shelf for improved muscle control for AROM.    Time 4  Period Weeks    Target Date  06/01/20      PT SHORT TERM GOAL #4   Title The patient will tolerate low intensity isometrics (as protocol allows) with pain < or equal to 2/10.    Time 4    Period Weeks    Target Date 06/01/20             PT Long Term Goals - 05/02/20 1044      PT LONG TERM GOAL #1   Title The patient will be indep with progression of HEP.    Time 8    Period Weeks    Target Date 07/01/20      PT LONG TERM GOAL #2   Title The patient will improve functional status score from 30% up to 58%.    Time 8    Period Weeks    Target Date 07/01/20      PT LONG TERM GOAL #3   Title The patient will be able to reach a shelf that is over shoulder height.    Time 8    Period Weeks    Target Date 07/01/20      PT LONG TERM GOAL #4   Title The patient will have L shoulder ER to > or equal to 50 degrees in supine arm abducted to 45-- as protocol allows.    Time 8    Period Weeks    Target Date 07/01/20      PT LONG TERM GOAL #5   Title The patient will be able to reach head for hygeine activities with L UE.    Time 8    Period Weeks    Target Date 07/01/20                  Plan - 05/02/20 1053    Clinical Impression Statement The patient is a 60 yo female presenting to OP physical therapy with h/o chronic L shoulder pain.  She is s/p reverse total shoulder arthroplasty on 04/12/20.  She presents today with impairments in AROM, PROM, muscle strength, posture, and use of L UE for daily activities.  PT to address deficits to improve use of L UE.    Personal Factors and Comorbidities Time since onset of injury/illness/exacerbation;Comorbidity 3+    Comorbidities L breast lumpectomy, diabetes, HTN, h/o arthritis.    Examination-Activity Limitations Bathing;Reach Overhead;Carry;Lift    Examination-Participation Restrictions Cleaning;Meal Prep;Driving;Community Activity;Laundry    Stability/Clinical Decision Making Stable/Uncomplicated    Clinical Decision Making Low    Rehab Potential Good     PT Frequency 2x / week    PT Duration 8 weeks    PT Treatment/Interventions ADLs/Self Care Home Management;Vasopneumatic Device;Electrical Stimulation;Cryotherapy;Moist Heat;Therapeutic activities;Therapeutic exercise;Manual techniques;Patient/family education    PT Next Visit Plan check HEP, progress AAROM within parameters provided by office note, progress AROM as tolerated.    PT Home Exercise Plan Access Code: [email or web address]    Consulted and Agree with Plan of Care Patient           Patient will benefit from skilled therapeutic intervention in order to improve the following deficits and impairments:  Pain,Impaired flexibility,Increased fascial restricitons,Decreased range of motion,Decreased strength,Postural dysfunction,Decreased activity tolerance,Increased edema,Decreased scar mobility  Visit Diagnosis: Left shoulder pain, unspecified chronicity  Muscle weakness (generalized)  Abnormal posture  Localized edema     Problem List Patient Active Problem List   Diagnosis Date Noted  . Arthritis of left shoulder region   . 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 Encounter for screening for lung cancer 04/13/2019  . Primary osteoarthritis of both shoulders 03/06/2019  . Chronic hypoxemic respiratory failure ([place]) 12/22/2018  . Osteopenia 12/20/2017  . Prediabetes 09/23/2017  . Colonoscopy refused 05/29/2017  . COPD, severe ([place]) 05/29/2017  . Multiple pulmonary nodules 05/29/2017  . Trigger thumb, right thumb 05/29/2017  . Primary osteoarthritis of first carpometacarpal joint of left hand 05/29/2017  . Kidney lesion 12/14/2016  . Erythrocytosis 11/13/2016  . Breast cancer ([place]) 09/20/2016  . Essential hypertension 09/20/2016  . Insomnia 09/20/2016  . Mixed hyperlipidemia 09/20/2016    Thelmer Legler , PT 05/02/2020, 10:57 AM  [facility] [place], [place], [address] Phone:  [phone]   Fax:  [phone]  Name: Sherri Gibson MRN: [id number] Date of Birth: 1960-07-16

## 2020-05-06 NOTE — Telephone Encounter (Signed)
error 

## 2020-05-09 ENCOUNTER — Ambulatory Visit (INDEPENDENT_AMBULATORY_CARE_PROVIDER_SITE_OTHER): Payer: BC Managed Care – PPO | Admitting: Rehabilitative and Restorative Service Providers"

## 2020-05-09 ENCOUNTER — Other Ambulatory Visit: Payer: Self-pay | Admitting: *Deleted

## 2020-05-09 ENCOUNTER — Other Ambulatory Visit: Payer: Self-pay

## 2020-05-09 DIAGNOSIS — M6281 Muscle weakness (generalized): Secondary | ICD-10-CM | POA: Diagnosis not present

## 2020-05-09 DIAGNOSIS — M25512 Pain in left shoulder: Secondary | ICD-10-CM

## 2020-05-09 DIAGNOSIS — R600 Localized edema: Secondary | ICD-10-CM

## 2020-05-09 DIAGNOSIS — R293 Abnormal posture: Secondary | ICD-10-CM

## 2020-05-09 DIAGNOSIS — Z122 Encounter for screening for malignant neoplasm of respiratory organs: Secondary | ICD-10-CM

## 2020-05-09 DIAGNOSIS — Z87891 Personal history of nicotine dependence: Secondary | ICD-10-CM

## 2020-05-09 NOTE — Therapy (Signed)
[place], [place], [address] Phone: [phone]   Fax:  [phone]  Physical Therapy Treatment  Patient Details  Name: Sherri Gibson MRN: [id number] Date of Birth: Sep 08, 1960 Referring Provider (PT): Gloriann Loan, [place]   Encounter Date: 05/09/2020   PT End of Session - 05/09/20 0843    Visit Number 2    Number of Visits 16    Date for PT Re-Evaluation 07/01/20    PT Start Time 0758    PT Stop Time 0850    PT Time Calculation (min) 52 min    Activity Tolerance Patient tolerated treatment well    Behavior During Therapy [facility] for tasks assessed/performed           Past Medical History:  Diagnosis Date  . Anemia    as a child  . Arthritis    Both Shoulders  . Breast cancer ([place]) 2016   Left   . Complication of anesthesia    states she had a hard time staying awake after hand surgery  . COPD (chronic obstructive pulmonary disease) (HCC)    uses O2 at 2l at night  . High blood pressure   . High cholesterol   . History of kidney stones   . Personal history of radiation therapy 2016   Left Breast Cancer  . Pneumonia    as a child  . Prediabetes     Past Surgical History:  Procedure Laterality Date  . BREAST BIOPSY    . BREAST LUMPECTOMY Left 2021  . BREAST LUMPECTOMY WITH AXILLARY LYMPH NODE BIOPSY  2016  . COLONOSCOPY  2017   [facility]. think there was colon polyps  . HAND SURGERY Left   . PARTIAL HYSTERECTOMY    . TOTAL SHOULDER ARTHROPLASTY Left 04/12/2020   Procedure: LEFT REVERSE TOTAL SHOULDER ARTHROPLASTY;  Surgeon: Meredith Pel, MD;  Location: [place];  Service: Orthopedics;  Laterality: Left;    There were no vitals filed for this visit.   Subjective Assessment - 05/09/20 0801    Subjective The patient reports she gets intermittent biceps pain throughout hte day.  She feels like a position with sleeping may be  provoking bicipital pain.  Pain is a 2-3/10 when it occurs.    Pertinent History HTN, arthritis, h/o L breast CA with lumpectomy, diabetes.    Patient Stated Goals Before my surgery, I couldn't lift my arm at all.  I'd like to be able to reach something on the top shelf.    Currently in Pain? No/denies              [facility] PT Assessment - 05/09/20 0819      Assessment   Medical Diagnosis L reverse total shoulder arthroplasty    Referring Provider (PT) Gloriann Loan, PA    Onset Date/Surgical Date 04/12/20    Hand Dominance Right      ROM / Strength   AROM / PROM / Strength AROM;PROM      PROM   Left Shoulder Flexion 142 Degrees    Left Shoulder ABduction 90 Degrees    Left Shoulder External Rotation 30 Degrees                         [facility] Adult PT Treatment/Exercise - 05/09/20 0823      Exercises   Exercises Shoulder      Shoulder Exercises: Supine   External Rotation  PROM;AAROM;Left;10 reps    External Rotation Limitations to 30 degrees    Flexion PROM;AAROM;AROM;Left;10 reps    ABduction AROM;AAROM;Left;10 reps    ABduction Limitations tightness noted in L pectoralis    Other Supine Exercises supine retraction bilaterally x 12 reps      Shoulder Exercises: Seated   Other Seated Exercises shoulder shrug      Shoulder Exercises: Stretch   Elbow Flexion AROM;Left;10 reps      Modalities   Modalities Vasopneumatic      Vasopneumatic   Number Minutes Vasopneumatic  10 minutes    Vasopnuematic Location  Shoulder    Vasopneumatic Pressure Low    Vasopneumatic Temperature  34      Manual Therapy   Manual Therapy Soft tissue mobilization;Myofascial release    Manual therapy comments to reduce muscle tightness/ scar tissue    Soft tissue mobilization STM anterior L shoulder and pec release    Myofascial Release pec release totolerance and scar tissue mobilization                  PT Education - 05/09/20 0843    Education Details HEP  progression    Person(s) Educated Patient    Methods Explanation;Demonstration;Handout    Comprehension Verbalized understanding;Returned demonstration            PT Short Term Goals - 05/09/20 0848      PT SHORT TERM GOAL #1   Title The patient will be indep with initial HEP.    Time 4    Period Weeks    Status On-going    Target Date 06/01/20      PT SHORT TERM GOAL #2   Title The patient will improve L shoulder P/ROM to 130 degrees.    Baseline 105 degrees up to 142 today for flexion.    Time 4    Period Weeks    Status Achieved    Target Date 06/01/20      PT SHORT TERM GOAL #3   Title The patient will demonstrate reaching to shoulder height shelf for improved muscle control for AROM.    Time 4    Period Weeks    Status On-going    Target Date 06/01/20      PT SHORT TERM GOAL #4   Title The patient will tolerate low intensity isometrics (as protocol allows) with pain < or equal to 2/10.    Time 4    Period Weeks    Status On-going    Target Date 06/01/20             PT Long Term Goals - 05/09/20 0848      PT LONG TERM GOAL #1   Title The patient will be indep with progression of HEP.    Time 8    Period Weeks    Status On-going      PT LONG TERM GOAL #2   Title The patient will improve functional status score from 30% up to 58%.    Time 8    Period Weeks    Status On-going      PT LONG TERM GOAL #3   Title The patient will be able to reach a shelf that is over shoulder height.    Time 8    Period Weeks    Status On-going      PT LONG TERM GOAL #4   Title The patient will have L shoulder ER to > or equal to 50 degrees in  supine arm abducted to 45-- as protocol allows.    Time 8    Period Weeks    Status On-going      PT LONG TERM GOAL #5   Title The patient will be able to reach head for hygeine activities with L UE.    Time 8    Period Weeks    Status On-going                 Plan - 05/09/20 1318    Clinical Impression  Statement The patient is improving with ROM today for flexion and ER.  PT measured abduction ROM to 90.  The patient has bilateral scapular musculature tightness and shoulder elevation/rounding for posture.  She cannot tolerate R sidelying due to chronic R shoulder pain for L scapular mobilization.  PT to continue to work on ROM and STM to tolerance.    PT Treatment/Interventions ADLs/Self Care Home Management;Vasopneumatic Device;Electrical Stimulation;Cryotherapy;Moist Heat;Therapeutic activities;Therapeutic exercise;Manual techniques;Patient/family education    PT Next Visit Plan check HEP, progress AAROM within parameters provided by office note, progress AROM as tolerated.    PT Home Exercise Plan Access Code: [email or web address]    Consulted and Agree with Plan of Care Patient           Patient will benefit from skilled therapeutic intervention in order to improve the following deficits and impairments:     Visit Diagnosis: Left shoulder pain, unspecified chronicity  Muscle weakness (generalized)  Abnormal posture  Localized edema     Problem List Patient Active Problem List   Diagnosis Date Noted  . Arthritis of left shoulder region   . 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 Encounter for screening for lung cancer 04/13/2019  . Primary osteoarthritis of both shoulders 03/06/2019  . Chronic hypoxemic respiratory failure ([place]) 12/22/2018  . Osteopenia 12/20/2017  . Prediabetes 09/23/2017  . Colonoscopy refused 05/29/2017  . COPD, severe ([place]) 05/29/2017  . Multiple pulmonary nodules 05/29/2017  . Trigger thumb, right thumb 05/29/2017  . Primary osteoarthritis of first carpometacarpal joint of left hand 05/29/2017  . Kidney lesion 12/14/2016  . Erythrocytosis 11/13/2016  . Breast cancer ([place]) 09/20/2016  . Essential hypertension 09/20/2016  . Insomnia 09/20/2016  . Mixed hyperlipidemia 09/20/2016    Jemery Stacey 05/09/2020, 1:21 PM  [facility] [place], [place], [address] Phone: [phone]   Fax:  [phone]  Name: Sherri Gibson MRN: [id number] Date of Birth: 12/13/1960

## 2020-05-09 NOTE — Patient Instructions (Signed)
Access Code: [email or web address] URL: [email or web address] Date: 05/09/2020 Prepared by: Rudell Cobb  Exercises Circular Shoulder Pendulum with Table Support - 2 x daily - 7 x weekly - 1 sets - 10 reps Seated Scapular Retraction - 2 x daily - 7 x weekly - 1 sets - 10 reps Seated Shoulder External Rotation AAROM with Dowel - 2 x daily - 7 x weekly - 1 sets - 10 reps Seated Shoulder Flexion Towel Slide at Table Top - 2 x daily - 7 x weekly - 1 sets - 10 reps Supine Shoulder Press - 2 x daily - 7 x weekly - 1 sets - 10 reps Supine Cervical Rotation AROM on Pillow - 2 x daily - 7 x weekly - 1 sets - 10 reps Supine Chin Tuck - 2 x daily - 7 x weekly - 1 sets - 10 reps  Patient Education Scar Massage

## 2020-05-10 ENCOUNTER — Telehealth: Payer: Self-pay | Admitting: General Surgery

## 2020-05-10 NOTE — Telephone Encounter (Signed)
-----   Message from Letta Pate, [place] sent at 03/15/2020  3:19 PM EST ----- Regarding: hosp case Schedule after shoulder surgery in March

## 2020-05-10 NOTE — Telephone Encounter (Signed)
Tried to contact the patient to schedule her procedure at [facility]. The phone rang no voicemail.

## 2020-05-10 NOTE — Telephone Encounter (Signed)
Left a voicemail for the patient to call back and schedule her procedure possibly on 06/16/2020.

## 2020-05-16 ENCOUNTER — Ambulatory Visit (INDEPENDENT_AMBULATORY_CARE_PROVIDER_SITE_OTHER): Payer: BC Managed Care – PPO

## 2020-05-16 ENCOUNTER — Ambulatory Visit: Payer: BC Managed Care – PPO | Admitting: Rehabilitative and Restorative Service Providers"

## 2020-05-16 ENCOUNTER — Other Ambulatory Visit: Payer: Self-pay

## 2020-05-16 DIAGNOSIS — M25512 Pain in left shoulder: Secondary | ICD-10-CM | POA: Diagnosis not present

## 2020-05-16 DIAGNOSIS — Z122 Encounter for screening for malignant neoplasm of respiratory organs: Secondary | ICD-10-CM

## 2020-05-16 DIAGNOSIS — Z87891 Personal history of nicotine dependence: Secondary | ICD-10-CM | POA: Diagnosis not present

## 2020-05-16 DIAGNOSIS — M6281 Muscle weakness (generalized): Secondary | ICD-10-CM | POA: Diagnosis not present

## 2020-05-16 DIAGNOSIS — R293 Abnormal posture: Secondary | ICD-10-CM

## 2020-05-16 NOTE — Therapy (Signed)
[place], [place], [address] Phone: [phone]   Fax:  [phone]  Physical Therapy Treatment  Patient Details  Name: Sherri Gibson MRN: [id number] Date of Birth: 08-20-1960 Referring Provider (PT): Gloriann Loan, [place]   Encounter Date: 05/16/2020   PT End of Session - 05/16/20 0854    Visit Number 3    Number of Visits 16    Date for PT Re-Evaluation 07/01/20    PT Start Time 0853    PT Stop Time 0940    PT Time Calculation (min) 47 min    Activity Tolerance Patient tolerated treatment well    Behavior During Therapy [facility] for tasks assessed/performed           Past Medical History:  Diagnosis Date  . Anemia    as a child  . Arthritis    Both Shoulders  . Breast cancer ([place]) 2016   Left   . Complication of anesthesia    states she had a hard time staying awake after hand surgery  . COPD (chronic obstructive pulmonary disease) (HCC)    uses O2 at 2l at night  . High blood pressure   . High cholesterol   . History of kidney stones   . Personal history of radiation therapy 2016   Left Breast Cancer  . Pneumonia    as a child  . Prediabetes     Past Surgical History:  Procedure Laterality Date  . BREAST BIOPSY    . BREAST LUMPECTOMY Left 2021  . BREAST LUMPECTOMY WITH AXILLARY LYMPH NODE BIOPSY  2016  . COLONOSCOPY  2017   [facility] [place] [facility]. think there was colon polyps  . HAND SURGERY Left   . PARTIAL HYSTERECTOMY    . TOTAL SHOULDER ARTHROPLASTY Left 04/12/2020   Procedure: LEFT REVERSE TOTAL SHOULDER ARTHROPLASTY;  Surgeon: Meredith Pel, MD;  Location: [place];  Service: Orthopedics;  Laterality: Left;    There were no vitals filed for this visit.   Subjective Assessment - 05/16/20 0853    Subjective The patient reports she is folding clothes (has been x 2 weeks). She notes it is hard to lift her arm and she has to move it out  to the side.  No pain at rest.  She has also returned to sweeping and putting dishes away.    Pertinent History HTN, arthritis, h/o L breast CA with lumpectomy, diabetes.    Patient Stated Goals Before my surgery, I couldn't lift my arm at all.  I'd like to be able to reach something on the top shelf.    Currently in Pain? No/denies              [facility] PT Assessment - 05/16/20 0854      Assessment   Medical Diagnosis L reverse total shoulder arthroplasty    Referring Provider (PT) Gloriann Loan, PA    Onset Date/Surgical Date 04/12/20    Hand Dominance Right    Next MD Visit 05/25/20      Precautions   Type of Shoulder Precautions Reverse total shoulder    Precaution Comments No ER>30 degrees, no strengthening until 6 weeks post (05/24/20), do not lift>2 lbs.      AROM   AROM Assessment Site Shoulder    Right/Left Shoulder Left    Left Shoulder Flexion 130 Degrees   in supine     PROM   Left Shoulder Flexion 144 Degrees  Left Shoulder ABduction 96 Degrees    Left Shoulder External Rotation 30 Degrees                         [facility] Adult PT Treatment/Exercise - 05/16/20 0855      Exercises   Exercises Shoulder      Shoulder Exercises: Supine   Flexion PROM;AAROM;Both;12 reps    ABduction AAROM;PROM    Other Supine Exercises chest press supine      Shoulder Exercises: Seated   Flexion AAROM;Left;20 reps    Flexion Limitations seated inclined table slide      Shoulder Exercises: Standing   Flexion AAROM;Left;10 reps    Flexion Limitations sliding on wall-- did 1 rep-- leads to shoulder hike-, therefore did seated inclie slide    ABduction AAROM;Both;10 reps    ABduction Limitations using a cane in standing with abduction to 80 degrees with cues to avoid shoulder/scapular elevation      Shoulder Exercises: Pulleys   Flexion 3 minutes    Flexion Limitations cues to stop movement when shoulder shrug begins      Modalities   Modalities Vasopneumatic       Vasopneumatic   Number Minutes Vasopneumatic  10 minutes    Vasopnuematic Location  Shoulder    Vasopneumatic Pressure Low    Vasopneumatic Temperature  34      Manual Therapy   Manual Therapy Passive ROM;Myofascial release    Myofascial Release pec release totolerance and scar tissue mobilization    Passive ROM passive ROM to tissue tolerance for flexion, ER (to 30 degrees) and abduction                    PT Short Term Goals - 05/09/20 0848      PT SHORT TERM GOAL #1   Title The patient will be indep with initial HEP.    Time 4    Period Weeks    Status On-going    Target Date 06/01/20      PT SHORT TERM GOAL #2   Title The patient will improve L shoulder P/ROM to 130 degrees.    Baseline 105 degrees up to 142 today for flexion.    Time 4    Period Weeks    Status Achieved    Target Date 06/01/20      PT SHORT TERM GOAL #3   Title The patient will demonstrate reaching to shoulder height shelf for improved muscle control for AROM.    Time 4    Period Weeks    Status On-going    Target Date 06/01/20      PT SHORT TERM GOAL #4   Title The patient will tolerate low intensity isometrics (as protocol allows) with pain < or equal to 2/10.    Time 4    Period Weeks    Status On-going    Target Date 06/01/20             PT Long Term Goals - 05/09/20 0848      PT LONG TERM GOAL #1   Title The patient will be indep with progression of HEP.    Time 8    Period Weeks    Status On-going      PT LONG TERM GOAL #2   Title The patient will improve functional status score from 30% up to 58%.    Time 8    Period Weeks    Status On-going  PT LONG TERM GOAL #3   Title The patient will be able to reach a shelf that is over shoulder height.    Time 8    Period Weeks    Status On-going      PT LONG TERM GOAL #4   Title The patient will have L shoulder ER to > or equal to 50 degrees in supine arm abducted to 45-- as protocol allows.    Time 8     Period Weeks    Status On-going      PT LONG TERM GOAL #5   Title The patient will be able to reach head for hygeine activities with L UE.    Time 8    Period Weeks    Status On-going                 Plan - 05/16/20 0854    Clinical Impression Statement The patient notes she is using the L UE for chores at home (laundry) and is compensating for weakness by lifting elbow to the side.  PT worked on [employer] in sitting and will continue to progress ROM within protocol parameters.    PT Treatment/Interventions ADLs/Self Care Home Management;Vasopneumatic Device;Electrical Stimulation;Cryotherapy;Moist Heat;Therapeutic activities;Therapeutic exercise;Manual techniques;Patient/family education    PT Next Visit Plan check HEP, progress AAROM within parameters provided by office note, progress AROM as tolerated.    PT Home Exercise Plan Access Code: [email or web address]    Consulted and Agree with Plan of Care Patient           Patient will benefit from skilled therapeutic intervention in order to improve the following deficits and impairments:     Visit Diagnosis: Left shoulder pain, unspecified chronicity  Muscle weakness (generalized)  Abnormal posture     Problem List Patient Active Problem List   Diagnosis Date Noted  . Arthritis of left shoulder region   . 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 Encounter for screening for lung cancer 04/13/2019  . Primary osteoarthritis of both shoulders 03/06/2019  . Chronic hypoxemic respiratory failure ([place]) 12/22/2018  . Osteopenia 12/20/2017  . Prediabetes 09/23/2017  . Colonoscopy refused 05/29/2017  . COPD, severe ([place]) 05/29/2017  . Multiple pulmonary nodules 05/29/2017  . Trigger thumb, right thumb 05/29/2017  . Primary osteoarthritis of first carpometacarpal joint of left hand 05/29/2017  . Kidney lesion 12/14/2016  . Erythrocytosis 11/13/2016  . Breast cancer ([place]) 09/20/2016  . Essential hypertension  09/20/2016  . Insomnia 09/20/2016  . Mixed hyperlipidemia 09/20/2016    Demani Weyrauch, PT 05/16/2020, 5:06 PM  [facility] [place], [place], [address] Phone: [phone]   Fax:  [phone]  Name: Sherri Gibson MRN: [id number] Date of Birth: March 20, 1960

## 2020-05-16 NOTE — Patient Instructions (Signed)
Access Code: [email or web address] URL: [email or web address] Date: 05/16/2020 Prepared by: Rudell Cobb  Exercises Seated Shoulder Flexion Towel Slide at Table Top - 2 x daily - 7 x weekly - 1 sets - 10 reps Seated Scapular Retraction - 2 x daily - 7 x weekly - 1 sets - 10 reps Seated Shoulder External Rotation AAROM with Dowel - 2 x daily - 7 x weekly - 1 sets - 10 reps Supine Shoulder Press AAROM in Abduction with Dowel - 2 x daily - 7 x weekly - 1 sets - 10 reps Supine Cervical Rotation AROM on Pillow - 2 x daily - 7 x weekly - 1 sets - 10 reps Supine Chin Tuck - 2 x daily - 7 x weekly - 1 sets - 10 reps Standing Shoulder Flexion AAROM with Dowel - 2 x daily - 7 x weekly - 1 sets - 5 reps Standing Shoulder Abduction AAROM with Dowel - 2 x daily - 7 x weekly - 1 sets - 5 reps Circular Shoulder Pendulum with Table Support - 2 x daily - 7 x weekly - 1 sets - 10 reps  Patient Education Scar Massage

## 2020-05-20 NOTE — Addendum Note (Signed)
Addended by: Curlene Labrum E on: 05/20/2020 04:20 PM   Modules accepted: Orders, SmartSet

## 2020-05-23 ENCOUNTER — Other Ambulatory Visit: Payer: Self-pay

## 2020-05-23 ENCOUNTER — Ambulatory Visit: Payer: BC Managed Care – PPO | Admitting: Rehabilitative and Restorative Service Providers"

## 2020-05-23 DIAGNOSIS — R293 Abnormal posture: Secondary | ICD-10-CM | POA: Diagnosis not present

## 2020-05-23 DIAGNOSIS — R600 Localized edema: Secondary | ICD-10-CM

## 2020-05-23 DIAGNOSIS — M25512 Pain in left shoulder: Secondary | ICD-10-CM | POA: Diagnosis not present

## 2020-05-23 DIAGNOSIS — M6281 Muscle weakness (generalized): Secondary | ICD-10-CM | POA: Diagnosis not present

## 2020-05-23 NOTE — Therapy (Signed)
[place], [place], [address] Phone: [phone]   Fax:  [phone]  Physical Therapy Treatment  Patient Details  Name: Sherri Gibson MRN: [id number] Date of Birth: [birth year] Referring Provider (PT): Gloriann Loan, [place]   Encounter Date: 05/23/2020   PT End of Session - 05/23/20 0720    Visit Number 4    Number of Visits 16    Date for PT Re-Evaluation 07/01/20    PT Start Time 0716    PT Stop Time 0800    PT Time Calculation (min) 44 min    Activity Tolerance Patient tolerated treatment well    Behavior During Therapy [facility] for tasks assessed/performed           Past Medical History:  Diagnosis Date  . Anemia    as a child  . Arthritis    Both Shoulders  . Breast cancer ([place]) 2016   Left   . Complication of anesthesia    states she had a hard time staying awake after hand surgery  . COPD (chronic obstructive pulmonary disease) (HCC)    uses O2 at 2l at night  . High blood pressure   . High cholesterol   . History of kidney stones   . Personal history of radiation therapy 2016   Left Breast Cancer  . Pneumonia    as a child  . Prediabetes     Past Surgical History:  Procedure Laterality Date  . BREAST BIOPSY    . BREAST LUMPECTOMY Left 2021  . BREAST LUMPECTOMY WITH AXILLARY LYMPH NODE BIOPSY  2016  . COLONOSCOPY  2017   [facility] [place] [facility]. think there was colon polyps  . HAND SURGERY Left   . PARTIAL HYSTERECTOMY    . TOTAL SHOULDER ARTHROPLASTY Left 04/12/2020   Procedure: LEFT REVERSE TOTAL SHOULDER ARTHROPLASTY;  Surgeon: Meredith Pel, MD;  Location: [place];  Service: Orthopedics;  Laterality: Left;    There were no vitals filed for this visit.   Subjective Assessment - 05/23/20 0718    Subjective The patient feels some popping in her shoulder with AAROM with cane in supine.  No pain.    Pertinent History HTN, arthritis,  h/o L breast CA with lumpectomy, diabetes.    Patient Stated Goals Before my surgery, I couldn't lift my arm at all.  I'd like to be able to reach something on the top shelf.    Currently in Pain? No/denies              [facility] PT Assessment - 05/23/20 0721      Assessment   Medical Diagnosis L reverse total shoulder arthroplasty    Referring Provider (PT) Gloriann Loan, PA    Onset Date/Surgical Date 04/12/20    Hand Dominance Right    Next MD Visit 05/25/20      Precautions   Precautions Shoulder    Type of Shoulder Precautions Reverse total shoulder    Precaution Comments No ER>30 degrees, no strengthening until 6 weeks post (05/24/20), do not lift>2 lbs.      Restrictions   Weight Bearing Restrictions Yes                         [facility] Adult PT Treatment/Exercise - 05/23/20 0722      Exercises   Exercises Shoulder;Elbow      Elbow Exercises   Elbow Flexion Limitations x 12 reps  AROM L elbow      Shoulder Exercises: Supine   Protraction AROM;Strengthening;Left;10 reps    External Rotation PROM;Left;10 reps    Flexion PROM;AROM;Strengthening;Left;10 reps    ABduction PROM;Left;10 reps    ABduction Limitations tightness noted in L pectoralis      Shoulder Exercises: Seated   Flexion AAROM;Left;20 reps    Flexion Limitations seated inclined table slide      Shoulder Exercises: Sidelying   ABduction PROM;Left;5 reps    Other Sidelying Exercises scapular protraction/retraction with tactile cues; scapular depression x 5 reps      Shoulder Exercises: Standing   Flexion AAROM;Left;15 reps    Flexion Limitations with arm supported on physioball; also attepted with arm at door frame, however patient shrugs L shoulder/ focused on scapular stabiization    ABduction AAROM;Left;15 reps    ABduction Limitations cues for shoulder depression    Retraction Strengthening;Both;12 reps      Shoulder Exercises: ROM/Strengthening   Ball on Wall x 30 seconds holding  ball on wall at shoulder height      Shoulder Exercises: Isometric Strengthening   Flexion 3X3";Supine    ABduction 3X3"    Other Isometric Exercises *patient is at 6 weeks 05/24/20-- initiated low intensity isometrics today      Shoulder Exercises: Stretch   Other Shoulder Stretches supine pec stretch      Modalities   Modalities Vasopneumatic      Vasopneumatic   Number Minutes Vasopneumatic  10 minutes    Vasopnuematic Location  Shoulder    Vasopneumatic Pressure Low    Vasopneumatic Temperature  34      Manual Therapy   Manual Therapy Passive ROM;Myofascial release    Soft tissue mobilization STM and IASTM R anterior shoulder (deltoid/biceps/pec)    Myofascial Release pec release totolerance and scar tissue mobilization    Passive ROM passive ROM to tissue tolerance for flexion, ER (to 30 degrees) and abduction                    PT Short Term Goals - 05/23/20 0720      PT SHORT TERM GOAL #1   Title The patient will be indep with initial HEP.    Time 4    Period Weeks    Status Achieved    Target Date 06/01/20      PT SHORT TERM GOAL #2   Title The patient will improve L shoulder P/ROM to 130 degrees.    Baseline 105 degrees up to 142 today for flexion.    Time 4    Period Weeks    Status Achieved    Target Date 06/01/20      PT SHORT TERM GOAL #3   Title The patient will demonstrate reaching to shoulder height shelf for improved muscle control for AROM.    Time 4    Period Weeks    Status Achieved    Target Date 06/01/20      PT SHORT TERM GOAL #4   Title The patient will tolerate low intensity isometrics (as protocol allows) with pain < or equal to 2/10.    Time 4    Period Weeks    Status Achieved    Target Date 06/01/20             PT Long Term Goals - 05/09/20 0848      PT LONG TERM GOAL #1   Title The patient will be indep with progression of HEP.  Time 8    Period Weeks    Status On-going      PT LONG TERM GOAL #2   Title  The patient will improve functional status score from 30% up to 58%.    Time 8    Period Weeks    Status On-going      PT LONG TERM GOAL #3   Title The patient will be able to reach a shelf that is over shoulder height.    Time 8    Period Weeks    Status On-going      PT LONG TERM GOAL #4   Title The patient will have L shoulder ER to > or equal to 50 degrees in supine arm abducted to 45-- as protocol allows.    Time 8    Period Weeks    Status On-going      PT LONG TERM GOAL #5   Title The patient will be able to reach head for hygeine activities with L UE.    Time 8    Period Weeks    Status On-going                 Plan - 05/23/20 0759    Clinical Impression Statement The patient reports she is using the L UE for chores at home and has less pain in her L UE as compared to the R UE.  The patient has ER to 30 degrees and flexion to 130 degrees today.  PT began to initiate low intensity isometrics as tomorrow is week 6 post op.  Pt sees MD Wednesday-- will progress to tolerance.    PT Treatment/Interventions ADLs/Self Care Home Management;Vasopneumatic Device;Electrical Stimulation;Cryotherapy;Moist Heat;Therapeutic activities;Therapeutic exercise;Manual techniques;Patient/family education    PT Next Visit Plan check HEP, progress AAROM within parameters provided by office note, progress AROM as tolerated.    PT Home Exercise Plan Access Code: [email or web address]    Consulted and Agree with Plan of Care Patient           Patient will benefit from skilled therapeutic intervention in order to improve the following deficits and impairments:     Visit Diagnosis: Left shoulder pain, unspecified chronicity  Muscle weakness (generalized)  Abnormal posture  Localized edema     Problem List Patient Active Problem List   Diagnosis Date Noted  . Arthritis of left shoulder region   . 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 Encounter for screening for lung  cancer 04/13/2019  . Primary osteoarthritis of both shoulders 03/06/2019  . Chronic hypoxemic respiratory failure ([place]) 12/22/2018  . Osteopenia 12/20/2017  . Prediabetes 09/23/2017  . Colonoscopy refused 05/29/2017  . COPD, severe ([place]) 05/29/2017  . Multiple pulmonary nodules 05/29/2017  . Trigger thumb, right thumb 05/29/2017  . Primary osteoarthritis of first carpometacarpal joint of left hand 05/29/2017  . Kidney lesion 12/14/2016  . Erythrocytosis 11/13/2016  . Breast cancer ([place]) 09/20/2016  . Essential hypertension 09/20/2016  . Insomnia 09/20/2016  . Mixed hyperlipidemia 09/20/2016    Atlas Kuc, PT 05/23/2020, 8:02 AM  [facility] [place], [place], [address] Phone: [phone]   Fax:  [phone]  Name: Sherri Gibson MRN: [id number] Date of Birth: Aug 24, 1960

## 2020-05-24 ENCOUNTER — Encounter: Payer: Self-pay | Admitting: Gastroenterology

## 2020-05-25 ENCOUNTER — Ambulatory Visit (INDEPENDENT_AMBULATORY_CARE_PROVIDER_SITE_OTHER): Payer: BC Managed Care – PPO | Admitting: Orthopedic Surgery

## 2020-05-25 ENCOUNTER — Encounter: Payer: Self-pay | Admitting: Orthopedic Surgery

## 2020-05-25 ENCOUNTER — Other Ambulatory Visit: Payer: Self-pay

## 2020-05-25 DIAGNOSIS — Z96612 Presence of left artificial shoulder joint: Secondary | ICD-10-CM

## 2020-05-25 NOTE — Progress Notes (Signed)
Post-Op Visit Note   Patient: Sherri Gibson           Date of Birth: 1960/10/13           MRN: [id number] Visit Date: 05/25/2020 PCP: Emeterio Reeve, DO   Assessment & Plan:  Chief Complaint:  Chief Complaint  Patient presents with  . Post-op Follow-up   Visit Diagnoses:  1. History of arthroplasty of left shoulder     Plan: Patient is a 60 year old female who presents s/p left reverse shoulder arthroplasty on 04/12/2020.  She is doing very well and she has no significant pain that she has to take medication for.  She is in physical therapy and she feels this is helping progress her shoulder range of motion.  She is now able to lift her arm up and wash her hair.  She does note some popping of the left shoulder with 1 specific exercise where she is doing a "push-up" motion.  She does not have any popping with any other activities throughout her daily life.  She feels she is still making progress in PT.  Denies any changes in the appearance of the incision, fevers, chills.  No drainage from the incision or bleeding.  She sleeps on her stomach normally but states this has been difficult as there is little tenderness over the incision when she lays down.  On exam she has 35 degrees external rotation, 80 degrees abduction, 105 degrees forward flexion.  Decent subscapularis strength.  Incision is healing well.  No popping noted with passive motion of the shoulder.  Plan to continue going to physical therapy and start strengthening exercises.  Follow-up in 6 weeks for clinical recheck.  Also at that time, plan for further evaluation of her right shoulder.  Last set of radiographs from February 2021 revealed moderate glenohumeral osteoarthritis and she states that her right shoulder is causing her much more difficulty than her left shoulder is currently.  She would like to pursue eventual shoulder replacement for this right shoulder in July 2022.  Likely order CT scan at next  appointment.  Follow-Up Instructions: No follow-ups on file.   Orders:  Orders Placed This Encounter  Procedures  . Ambulatory referral to Physical Therapy   No orders of the defined types were placed in this encounter.   Imaging: No results found.  PMFS History: Patient Active Problem List   Diagnosis Date Noted  . Arthritis of left shoulder region   . 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 Encounter for screening for lung cancer 04/13/2019  . Primary osteoarthritis of both shoulders 03/06/2019  . Chronic hypoxemic respiratory failure ([employer]) 12/22/2018  . Osteopenia 12/20/2017  . Prediabetes 09/23/2017  . Colonoscopy refused 05/29/2017  . COPD, severe ([place]) 05/29/2017  . Multiple pulmonary nodules 05/29/2017  . Trigger thumb, right thumb 05/29/2017  . Primary osteoarthritis of first carpometacarpal joint of left hand 05/29/2017  . Kidney lesion 12/14/2016  . Erythrocytosis 11/13/2016  . Breast cancer ([place]) 09/20/2016  . Essential hypertension 09/20/2016  . Insomnia 09/20/2016  . Mixed hyperlipidemia 09/20/2016   Past Medical History:  Diagnosis Date  . Anemia    as a child  . Arthritis    Both Shoulders  . Breast cancer ([place]) 2016   Left   . Complication of anesthesia    states she had a hard time staying awake after hand surgery  . COPD (chronic obstructive pulmonary disease) (HCC)    uses O2 at 2l at night  .  High blood pressure   . High cholesterol   . History of kidney stones   . Personal history of radiation therapy 2016   Left Breast Cancer  . Pneumonia    as a child  . Prediabetes     Family History  Problem Relation Age of Onset  . Breast cancer Mother   . Diabetes Brother   . Colon cancer Neg Hx   . Esophageal cancer Neg Hx     Past Surgical History:  Procedure Laterality Date  . BREAST BIOPSY    . BREAST LUMPECTOMY Left 2021  . BREAST LUMPECTOMY WITH AXILLARY LYMPH NODE BIOPSY  2016  . COLONOSCOPY  2017   [facility] [place] [facility]. think there was colon polyps  . HAND SURGERY Left   . PARTIAL HYSTERECTOMY    . TOTAL SHOULDER ARTHROPLASTY Left 04/12/2020   Procedure: LEFT REVERSE TOTAL SHOULDER ARTHROPLASTY;  Surgeon: Meredith Pel, MD;  Location: [place];  Service: Orthopedics;  Laterality: Left;   Social History   Occupational History  . Not on file  Tobacco Use  . Smoking status: Former Smoker    Packs/day: 2.50    Years: 44.00    Pack years: 110.00    Types: Cigarettes    Quit date: 11/29/2016    Years since quitting: 3.4  . Smokeless tobacco: Never Used  Vaping Use  . Vaping Use: Never used  Substance and Sexual Activity  . Alcohol use: Not Currently  . Drug use: Never  . Sexual activity: Yes    Partners: Male    Birth control/protection: None

## 2020-05-26 ENCOUNTER — Encounter: Payer: Self-pay | Admitting: Rehabilitative and Restorative Service Providers"

## 2020-05-26 ENCOUNTER — Ambulatory Visit: Payer: BC Managed Care – PPO | Admitting: Rehabilitative and Restorative Service Providers"

## 2020-05-26 DIAGNOSIS — R293 Abnormal posture: Secondary | ICD-10-CM

## 2020-05-26 DIAGNOSIS — M25512 Pain in left shoulder: Secondary | ICD-10-CM | POA: Diagnosis not present

## 2020-05-26 DIAGNOSIS — M6281 Muscle weakness (generalized): Secondary | ICD-10-CM | POA: Diagnosis not present

## 2020-05-26 DIAGNOSIS — J449 Chronic obstructive pulmonary disease, unspecified: Secondary | ICD-10-CM | POA: Diagnosis not present

## 2020-05-26 NOTE — Patient Instructions (Signed)
Access Code: [email or web address] URL: [email or web address] Date: 05/26/2020 Prepared by: Rudell Cobb  Exercises Seated Shoulder Flexion Towel Slide at Table Top - 2 x daily - 7 x weekly - 1 sets - 10 reps Supine Single Arm Shoulder External Rotation AROM - 2 x daily - 7 x weekly - 1 sets - 10 reps Supine Cervical Rotation AROM on Pillow - 2 x daily - 7 x weekly - 1 sets - 10 reps Supine Chin Tuck - 2 x daily - 7 x weekly - 1 sets - 10 reps Standing Shoulder Flexion AAROM with Dowel - 2 x daily - 7 x weekly - 1 sets - 5 reps Standing Shoulder Abduction AAROM with Dowel - 2 x daily - 7 x weekly - 1 sets - 5 reps Standing Isometric Shoulder Flexion with Doorway - Arm Bent - 2 x daily - 7 x weekly - 1 sets - 10 reps Standing Isometric Shoulder Abduction with Doorway - Arm Bent - 2 x daily - 7 x weekly - 1 sets - 10 reps Standing Isometric Shoulder Internal Rotation with Towel Roll at Doorway - 2 x daily - 7 x weekly - 1 sets - 10 reps Standing Shoulder Extension with Resistance - 2 x daily - 7 x weekly - 1 sets - 10 reps

## 2020-05-26 NOTE — Therapy (Signed)
[place], [place], [address] Phone: [phone]   Fax:  [phone]  Physical Therapy Treatment  Patient Details  Name: Sherri Gibson MRN: [id number] Date of Birth: 1960-05-09 Referring Provider (PT): Gloriann Loan, [place]   Encounter Date: 05/26/2020   PT End of Session - 05/26/20 1256    Visit Number 5    Number of Visits 16    Date for PT Re-Evaluation 07/01/20    PT Start Time 1150    PT Stop Time 1228    PT Time Calculation (min) 38 min    Activity Tolerance Patient tolerated treatment well    Behavior During Therapy [facility] for tasks assessed/performed           Past Medical History:  Diagnosis Date  . Anemia    as a child  . Arthritis    Both Shoulders  . Breast cancer ([place]) 2016   Left   . Complication of anesthesia    states she had a hard time staying awake after hand surgery  . COPD (chronic obstructive pulmonary disease) (HCC)    uses O2 at 2l at night  . High blood pressure   . High cholesterol   . History of kidney stones   . Personal history of radiation therapy 2016   Left Breast Cancer  . Pneumonia    as a child  . Prediabetes     Past Surgical History:  Procedure Laterality Date  . BREAST BIOPSY    . BREAST LUMPECTOMY Left 2021  . BREAST LUMPECTOMY WITH AXILLARY LYMPH NODE BIOPSY  2016  . COLONOSCOPY  2017   [facility]. think there was colon polyps  . HAND SURGERY Left   . PARTIAL HYSTERECTOMY    . TOTAL SHOULDER ARTHROPLASTY Left 04/12/2020   Procedure: LEFT REVERSE TOTAL SHOULDER ARTHROPLASTY;  Surgeon: Meredith Pel, MD;  Location: [place];  Service: Orthopedics;  Laterality: Left;    There were no vitals filed for this visit.   Subjective Assessment - 05/26/20 1155    Subjective The patient saw MD and order put in to begin strengthening.    Pertinent History HTN, arthritis, h/o L breast CA with  lumpectomy, diabetes.    Patient Stated Goals Before my surgery, I couldn't lift my arm at all.  I'd like to be able to reach something on the top shelf.    Currently in Pain? Yes              [facility] PT Assessment - 05/26/20 1156      Assessment   Medical Diagnosis L reverse total shoulder arthroplasty    Referring Provider (PT) Gloriann Loan, PA    Onset Date/Surgical Date 04/12/20    Hand Dominance Right    Next MD Visit 6 weeks from 05/25/20      Precautions   Precautions Shoulder    Type of Shoulder Precautions Reverse total shoulder    Precaution Comments *note from MD to progress strengthening to patient tolerance      AROM   Left Shoulder Flexion 150 Degrees    Left Shoulder ABduction 90 Degrees    Left Shoulder External Rotation 35 Degrees                         [facility] Adult PT Treatment/Exercise - 05/26/20 1157      Exercises   Exercises Shoulder;Elbow  Shoulder Exercises: Supine   Protraction AROM;Strengthening;Left;12 reps    Horizontal ABduction PROM    Horizontal ABduction Limitations for pec stretch    External Rotation AROM;Left;10 reps    Flexion AROM;PROM;Left;10 reps    ABduction PROM;AAROM    ABduction Limitations in plane of scapula    Diagonals AROM;Left;10 reps;AAROM      Shoulder Exercises: Standing   External Rotation Strengthening;Left;5 reps    External Rotation Limitations lifts elbow, modified to supine    ABduction AROM    ABduction Limitations scaption without resistance    Retraction Strengthening;Both;12 reps      Shoulder Exercises: Pulleys   Flexion 3 minutes      Shoulder Exercises: Isometric Strengthening   Flexion 3X3"   standing   External Rotation 3X3"    Internal Rotation 3X3"    ABduction 3X3"   standing                 PT Education - 05/26/20 1255    Education Details HEP    Person(s) Educated Patient    Methods Explanation;Demonstration;Handout    Comprehension Verbalized  understanding;Returned demonstration            PT Short Term Goals - 05/23/20 0720      PT SHORT TERM GOAL #1   Title The patient will be indep with initial HEP.    Time 4    Period Weeks    Status Achieved    Target Date 06/01/20      PT SHORT TERM GOAL #2   Title The patient will improve L shoulder P/ROM to 130 degrees.    Baseline 105 degrees up to 142 today for flexion.    Time 4    Period Weeks    Status Achieved    Target Date 06/01/20      PT SHORT TERM GOAL #3   Title The patient will demonstrate reaching to shoulder height shelf for improved muscle control for AROM.    Time 4    Period Weeks    Status Achieved    Target Date 06/01/20      PT SHORT TERM GOAL #4   Title The patient will tolerate low intensity isometrics (as protocol allows) with pain < or equal to 2/10.    Time 4    Period Weeks    Status Achieved    Target Date 06/01/20             PT Long Term Goals - 05/26/20 1256      PT LONG TERM GOAL #1   Title The patient will be indep with progression of HEP.    Time 8    Period Weeks    Status On-going      PT LONG TERM GOAL #2   Title The patient will improve functional status score from 30% up to 58%.    Time 8    Period Weeks    Status On-going      PT LONG TERM GOAL #3   Title The patient will be able to reach a shelf that is over shoulder height.    Time 8    Period Weeks    Status On-going      PT LONG TERM GOAL #4   Title The patient will have L shoulder ER to > or equal to 50 degrees in supine arm abducted to 45-- as protocol allows.    Time 8    Period Weeks    Status On-going  PT LONG TERM GOAL #5   Title The patient will be able to reach head for hygeine activities with L UE.    Time 8    Period Weeks    Status Achieved                 Plan - 05/26/20 1256    Clinical Impression Statement The patient has met LTG for reaching her head for hygeine. She is progressing with ability to perform ADLs and  IADLs using L UE.  She has the most difficulty with ER in standing (lifts elbow).  PT was able to progress strengthening based on MD referral to add strengthening today.  Plan to continue to progress to patient tolerance.    PT Treatment/Interventions ADLs/Self Care Home Management;Vasopneumatic Device;Electrical Stimulation;Cryotherapy;Moist Heat;Therapeutic activities;Therapeutic exercise;Manual techniques;Patient/family education    PT Next Visit Plan check HEP,  progress AROM as tolerated, progress strengthening for funcitonal UE use    PT Home Exercise Plan Access Code: [email or web address]    Consulted and Agree with Plan of Care Patient           Patient will benefit from skilled therapeutic intervention in order to improve the following deficits and impairments:     Visit Diagnosis: Left shoulder pain, unspecified chronicity  Muscle weakness (generalized)  Abnormal posture     Problem List Patient Active Problem List   Diagnosis Date Noted  . Arthritis of left shoulder region   . 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 Encounter for screening for lung cancer 04/13/2019  . Primary osteoarthritis of both shoulders 03/06/2019  . Chronic hypoxemic respiratory failure ([place]) 12/22/2018  . Osteopenia 12/20/2017  . Prediabetes 09/23/2017  . Colonoscopy refused 05/29/2017  . COPD, severe ([place]) 05/29/2017  . Multiple pulmonary nodules 05/29/2017  . Trigger thumb, right thumb 05/29/2017  . Primary osteoarthritis of first carpometacarpal joint of left hand 05/29/2017  . Kidney lesion 12/14/2016  . Erythrocytosis 11/13/2016  . Breast cancer ([place]) 09/20/2016  . Essential hypertension 09/20/2016  . Insomnia 09/20/2016  . Mixed hyperlipidemia 09/20/2016    Enslee Bibbins, PT 05/26/2020, 12:58 PM  [facility] [place], [place], [address] Phone: [phone]   Fax:  [phone]  Name: Sherri Gibson MRN: [id number] Date of Birth: 11-09-1960

## 2020-05-30 ENCOUNTER — Other Ambulatory Visit: Payer: Self-pay | Admitting: *Deleted

## 2020-05-30 ENCOUNTER — Encounter: Payer: BC Managed Care – PPO | Admitting: Rehabilitative and Restorative Service Providers"

## 2020-05-30 DIAGNOSIS — Z87891 Personal history of nicotine dependence: Secondary | ICD-10-CM

## 2020-05-30 NOTE — Progress Notes (Signed)
Please call patient and let them  know their  low dose Ct was read as a Lung RADS 2: nodules that are benign in appearance and behavior with a very low likelihood of becoming a clinically active cancer due to size or lack of growth. Recommendation per radiology is for a repeat LDCT in 12 months. .Please let them  know we will order and schedule their  annual screening scan for 04/2021. Please let them  know there was notation of CAD on their  scan.  Please remind the patient  that this is a non-gated exam therefore degree or severity of disease  cannot be determined. Please have them  follow up with their PCP regarding potential risk factor modification, dietary therapy or pharmacologic therapy if clinically indicated. Pt.  is  currently on statin therapy. Please place order for annual  screening scan for  04/2021 and fax results to PCP. Thanks so much.  Stable left kidney cyst

## 2020-05-31 ENCOUNTER — Ambulatory Visit: Payer: BC Managed Care – PPO | Admitting: Osteopathic Medicine

## 2020-06-01 ENCOUNTER — Encounter: Payer: Self-pay | Admitting: Osteopathic Medicine

## 2020-06-01 ENCOUNTER — Ambulatory Visit: Payer: BC Managed Care – PPO | Admitting: Osteopathic Medicine

## 2020-06-01 ENCOUNTER — Other Ambulatory Visit: Payer: Self-pay

## 2020-06-01 VITALS — BP 108/76 | HR 91 | Temp 99.0°F | Wt 118.1 lb

## 2020-06-01 DIAGNOSIS — L308 Other specified dermatitis: Secondary | ICD-10-CM

## 2020-06-01 DIAGNOSIS — R634 Abnormal weight loss: Secondary | ICD-10-CM

## 2020-06-01 DIAGNOSIS — M19011 Primary osteoarthritis, right shoulder: Secondary | ICD-10-CM

## 2020-06-01 DIAGNOSIS — J449 Chronic obstructive pulmonary disease, unspecified: Secondary | ICD-10-CM

## 2020-06-01 DIAGNOSIS — M19012 Primary osteoarthritis, left shoulder: Secondary | ICD-10-CM

## 2020-06-01 DIAGNOSIS — R7303 Prediabetes: Secondary | ICD-10-CM | POA: Diagnosis not present

## 2020-06-01 LAB — POCT GLYCOSYLATED HEMOGLOBIN (HGB A1C): Hemoglobin A1C: 6.1 % — AB (ref 4.0–5.6)

## 2020-06-01 MED ORDER — BETAMETHASONE DIPROPIONATE 0.05 % EX CREA: TOPICAL_CREAM | Freq: Two times a day (BID) | CUTANEOUS | 1 refills | Status: DC

## 2020-06-01 MED ORDER — TRAMADOL HCL 50 MG PO TABS: ORAL_TABLET | ORAL | 0 refills | Status: DC

## 2020-06-01 NOTE — Patient Instructions (Addendum)
Refilled Tramadol  Follow as directed w/ orthopedics  Sugars today okay in prediabetic range   Weight relatively stable Keep appointment with GI for colonoscopy  Sent Rx for what looks like mild eczema on fingers - let me know if ont working

## 2020-06-01 NOTE — Progress Notes (Signed)
Sherri Gibson is a 60 y.o. female who presents to  [place] at [facility]  today, 06/01/20, seeking care for the following:  Follow up diabetes  Current meds: ? 500 mg metformin daily   A1C: ? 12/02/2019: 6.7 ? 03/03/2020:  5.9 ? 06/01/2020: 6.1 Last visit, pt reports increased thirst and urination that she attributes to dry mouth.   Follow up unintentional weight loss Pt with a history of breast cancer (2016) and COPD has lost about 15 lbs over the last year. 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 Pt has been established with GI, colonoscopy upcoming. CT chest done 05/16/20 for lung cancer screening.   Wt Readings from Last 3 Encounters:  06/01/20 118 lb 1.9 oz (53.6 kg)  04/14/20 120 lb (54.4 kg)  04/12/20 120 lb (54.4 kg)    New problem: thumb skin peeling a bit Ongoing a few months, mild, no new exposures to irritants she knows of      ASSESSMENT & PLAN with other pertinent findings:  The primary encounter diagnosis was Prediabetes. Diagnoses of Primary osteoarthritis of both shoulders, Unintended weight loss, COPD, severe ([place]), and Other eczema were also pertinent to this visit.    Patient Instructions  Refilled Tramadol  Follow as directed w/ orthopedics  Sugars today okay in prediabetic range   Weight relatively stable Keep appointment with GI for colonoscopy  Sent Rx for what looks like mild eczema on fingers - let me know if ont working    Orders Placed This Encounter  Procedures  . POCT HgB A1C    Meds ordered this encounter  Medications  . traMADol (ULTRAM) 50 MG tablet    Sig: TAKE 1-2 TABLETS BY MOUTH EVERY 8 HOURS AS NEEDED FOR MODERATE PAIN. MAX OF 6 TABLETS PER DAY. #90 for thirty days unless prescribed otherwise by orthopedics    Dispense:  90 tablet    Refill:  0    Not to exceed 5 additional  fills before 09/06/2020  . betamethasone dipropionate 0.05 % cream    Sig: Apply topically 2 (two) times daily. To affected area(s) as needed - eczema on hands    Dispense:  30 g    Refill:  1     See below for relevant physical exam findings  See below for recent lab and imaging results reviewed  Medications, allergies, PMH, PSH, SocH, [place] reviewed below    Follow-up instructions: Return in about 4 months (around 10/02/2020) for monitor A1C (prediabetes) and weight (unintentional weight loss) .                                        Exam:  BP 108/76 (BP Location: Left Arm, Patient Position: Sitting, Cuff Size: Small)   Pulse 91   Temp 99 F (37.2 C) (Oral)   Wt 118 lb 1.9 oz (53.6 kg)   BMI 23.86 kg/m   Constitutional: VS see above. General Appearance: alert, well-developed, well-nourished, NAD  Neck: No masses, trachea midline.   Respiratory: Normal respiratory effort. no wheeze, no rhonchi, no rales. diminshed breasth sounds bilaterally   Cardiovascular: S1/S2 normal, no murmur, no rub/gallop auscultated. RRR.   Musculoskeletal: Gait normal. Symmetric and independent movement of all extremities  Neurological: Normal balance/coordination. No tremor.  Skin: warm, dry, intact.  Mild peeling on palmar surface of thumbs bilterally, normal nails   Psychiatric: Normal judgment/insight. Normal mood and affect. Oriented x3.   Current Meds  Medication Sig  . albuterol (PROVENTIL) (2.5 MG/3ML) 0.083% nebulizer solution Take 3 mLs (2.5 mg total) by nebulization every 2 (two) hours as needed for wheezing or shortness of breath.  Marland Kitchen albuterol (VENTOLIN HFA) 108 (90 Base) MCG/ACT inhaler USE 2 INHALATIONS EVERY 4 TO 6 HOURS AS NEEDED  . amLODipine (NORVASC) 10 MG tablet TAKE 1 TABLET DAILY (Patient taking differently: Take 10 mg by mouth at bedtime.)  . atorvastatin (LIPITOR) 40 MG tablet TAKE 1 TABLET DAILY (DISCONTINUE PRAVASTATIN) (Patient  taking differently: Take 40 mg by mouth daily.)  . betamethasone dipropionate 0.05 % cream Apply topically 2 (two) times daily. To affected area(s) as needed - eczema on hands  . Calcium-Magnesium-Vitamin D (CALCIUM 1200+D3 PO) Take 600 mg by mouth 2 (two) times daily.  . cyclobenzaprine (FLEXERIL) 10 MG tablet TAKE 1 TABLET THREE TIMES A DAY AS NEEDED FOR MUSCLE SPASMS (Patient taking differently: Take 10 mg by mouth daily.)  . gabapentin (NEURONTIN) 300 MG capsule Take 1 capsule (300 mg total) by mouth 3 (three) times daily.  Marland Kitchen ketorolac (TORADOL) 10 MG tablet Take 1 tablet (10 mg total) by mouth every 8 (eight) hours as needed.  . loratadine (CLARITIN) 10 MG tablet TAKE 1 TABLET BY MOUTH EVERY DAY (Patient taking differently: Take 10 mg by mouth at bedtime.)  . losartan (COZAAR) 50 MG tablet TAKE 1 TABLET DAILY (Patient taking differently: Take 50 mg by mouth at bedtime.)  . metFORMIN (GLUCOPHAGE) 500 MG tablet TAKE 1 TABLET DAILY WITH BREAKFAST (NEED A DIABETES MELLITUS CHECK) (Patient taking differently: Take 500 mg by mouth daily with breakfast.)  . montelukast (SINGULAIR) 10 MG tablet TAKE 1 TABLET AT BEDTIME (Patient taking differently: Take 10 mg by mouth at bedtime.)  . Multiple Vitamins-Minerals (MULTIVITAMIN WITH MINERALS) tablet Take 1 tablet by mouth daily.  . ondansetron (ZOFRAN) 4 MG tablet Take 1 tablet (4 mg total) by mouth every 6 (six) hours as needed for nausea or vomiting (use for nausea with colonoscopy prep).  . OXYGEN Inhale 2 L into the lungs at bedtime.  Marland Kitchen SPIRIVA RESPIMAT 2.5 MCG/ACT AERS USE 2 INHALATIONS DAILY (Patient taking differently: Inhale 2 puffs into the lungs daily.)  . SYMBICORT 160-4.5 MCG/ACT inhaler USE 2 INHALATIONS TWICE A DAY (Patient taking differently: Inhale 2 puffs into the lungs 2 (two) times daily.)  . [DISCONTINUED] oxyCODONE-acetaminophen (PERCOCET) 5-325 MG tablet Take 1 tablet by mouth every 4 (four) hours as needed for severe pain.     Allergies  Allergen Reactions  . Codeine Itching    Feels like something crawling on her skin   . Tetanus Toxoids Swelling    Patient stated,"I was given a Pneumonia shot at the same time I got the Tetanus shot, in the same arm."    Patient Active Problem List   Diagnosis Date Noted  . Arthritis of left shoulder region   . 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 Encounter for screening for lung cancer 04/13/2019  . Primary osteoarthritis of both shoulders 03/06/2019  . Chronic hypoxemic respiratory failure ([place]) 12/22/2018  . Osteopenia 12/20/2017  . Prediabetes 09/23/2017  . Colonoscopy refused 05/29/2017  . COPD, severe ([place]) 05/29/2017  . Multiple pulmonary nodules 05/29/2017  . Trigger thumb, right thumb 05/29/2017  . Primary osteoarthritis of first carpometacarpal joint of left hand 05/29/2017  . Kidney lesion  12/14/2016  . Erythrocytosis 11/13/2016  . Breast cancer ([place]) 09/20/2016  . Essential hypertension 09/20/2016  . Insomnia 09/20/2016  . Mixed hyperlipidemia 09/20/2016    Family History  Problem Relation Age of Onset  . Breast cancer Mother   . Diabetes Brother   . Colon cancer Neg Hx   . Esophageal cancer Neg Hx     Social History   Tobacco Use  Smoking Status Former Smoker  . Packs/day: 2.50  . Years: 44.00  . Pack years: 110.00  . Types: Cigarettes  . Quit date: 11/29/2016  . Years since quitting: 3.5  Smokeless Tobacco Never Used    Past Surgical History:  Procedure Laterality Date  . BREAST BIOPSY    . BREAST LUMPECTOMY Left 2021  . BREAST LUMPECTOMY WITH AXILLARY LYMPH NODE BIOPSY  2016  . COLONOSCOPY  2017   [facility]. think there was colon polyps  . HAND SURGERY Left   . PARTIAL HYSTERECTOMY    . TOTAL SHOULDER ARTHROPLASTY Left 04/12/2020   Procedure: LEFT REVERSE TOTAL SHOULDER ARTHROPLASTY;  Surgeon: Meredith Pel, MD;  Location: [facility];  Service: Orthopedics;   Laterality: Left;    Immunization History  Administered Date(s) Administered  . Influenza, Seasonal, Injecte, Preservative Fre 10/15/2016  . Influenza,inj,Quad PF,6+ Mos 09/23/2017, 09/16/2018, 10/14/2019  . PFIZER(Purple Top)SARS-COV-2 Vaccination 04/14/2019, 05/05/2019, 12/05/2019  . Pneumococcal Polysaccharide-23 05/29/2017  . Tdap 05/29/2017  . Zoster Recombinat (Shingrix) 04/08/2020    Recent Results (from the past 2160 hour(s))  POCT HgB A1C     Status: Abnormal   Collection Time: 03/03/20  8:22 AM  Result Value Ref Range   Hemoglobin A1C 5.9 (A) 4.0 - 5.6 %   HbA1c POC (<> result, manual entry)     HbA1c, POC (prediabetic range)     HbA1c, POC (controlled diabetic range)    SARS CORONAVIRUS 2 (TAT 6-24 HRS) Nasopharyngeal Nasopharyngeal Swab     Status: None   Collection Time: 04/08/20  9:13 AM   Specimen: Nasopharyngeal Swab  Result Value Ref Range   SARS Coronavirus 2 NEGATIVE NEGATIVE    Comment: (NOTE) SARS-CoV-2 target nucleic acids are NOT DETECTED.  The SARS-CoV-2 RNA is generally detectable in upper and lower respiratory specimens during the acute phase of infection. Negative results do not preclude SARS-CoV-2 infection, do not rule out co-infections with other pathogens, and should not be used as the sole basis for treatment or other patient management decisions. Negative results must be combined with clinical observations, patient history, and epidemiological information. The expected result is Negative.  Fact Sheet for Patients: SugarRoll.be  Fact Sheet for Healthcare Providers: https://www.woods-mathews.com/  This test is not yet approved or cleared by the [place] FDA and  has been authorized for detection and/or diagnosis of SARS-CoV-2 by FDA under an Emergency Use Authorization (EUA). This EUA will remain  in effect (meaning this test can be used) for the duration of the COVID-19 declaration under Se  ction 564(b)(1) of the Act, 21 U.S.C. section 360bbb-3(b)(1), unless the authorization is terminated or revoked sooner.  Performed at [facility], [place] [address], [place], [place] [address]   Glucose, capillary     Status: None   Collection Time: 04/12/20  8:49 AM  Result Value Ref Range   Glucose-Capillary 95 70 - 99 mg/dL    Comment: Glucose reference range applies only to samples taken after fasting for at least 8 hours.  Basic metabolic panel  Status: None   Collection Time: 04/12/20  9:11 AM  Result Value Ref Range   Sodium 140 135 - 145 mmol/L   Potassium 3.8 3.5 - 5.1 mmol/L   Chloride 104 98 - 111 mmol/L   CO2 26 22 - 32 mmol/L   Glucose, Bld 88 70 - 99 mg/dL    Comment: Glucose reference range applies only to samples taken after fasting for at least 8 hours.   BUN 12 6 - 20 mg/dL   Creatinine, Ser 0.75 0.44 - 1.00 mg/dL   Calcium 9.4 8.9 - 10.3 mg/dL   GFR, Estimated >60 >60 mL/min    Comment: (NOTE) Calculated using the CKD-EPI Creatinine Equation (2021)    Anion gap 10 5 - 15    Comment: Performed at [facility] [address], [place], [place] [address]  CBC     Status: None   Collection Time: 04/12/20  9:11 AM  Result Value Ref Range   WBC 6.6 4.0 - 10.5 K/uL   RBC 4.63 3.87 - 5.11 MIL/uL   Hemoglobin 13.2 12.0 - 15.0 g/dL   HCT 41.7 36.0 - 46.0 %   MCV 90.1 80.0 - 100.0 fL   MCH 28.5 26.0 - 34.0 pg   MCHC 31.7 30.0 - 36.0 g/dL   RDW 13.2 11.5 - 15.5 %   Platelets 364 150 - 400 K/uL   nRBC 0.0 0.0 - 0.2 %    Comment: Performed at [facility], [place] [address] [place], [place] [address]  Urinalysis, Routine w reflex microscopic     Status: Abnormal   Collection Time: 04/12/20  9:14 AM  Result Value Ref Range   Color, Urine YELLOW YELLOW   APPearance CLEAR CLEAR   Specific Gravity, Urine 1.015 1.005 - 1.030   pH 5.0 5.0 - 8.0   Glucose, UA NEGATIVE NEGATIVE mg/dL   Hgb urine dipstick NEGATIVE NEGATIVE   Bilirubin Urine NEGATIVE  NEGATIVE   Ketones, ur NEGATIVE NEGATIVE mg/dL   Protein, ur NEGATIVE NEGATIVE mg/dL   Nitrite NEGATIVE NEGATIVE   Leukocytes,Ua SMALL (A) NEGATIVE   RBC / HPF 0-5 0 - 5 RBC/hpf   WBC, UA 0-5 0 - 5 WBC/hpf   Bacteria, UA NONE SEEN NONE SEEN   Squamous Epithelial / LPF 0-5 0 - 5   Mucus PRESENT     Comment: Performed at [facility], [place] [address], [place], [place] [address]  Urine culture     Status: None   Collection Time: 04/12/20  9:14 AM   Specimen: Urine, Clean Catch  Result Value Ref Range   Specimen Description URINE, CLEAN CATCH    Special Requests NONE    Culture      NO GROWTH Performed at [facility], [place] [address] [place], [place] [address]    Report Status 04/13/2020 FINAL   Surgical pcr screen     Status: None   Collection Time: 04/12/20  9:16 AM   Specimen: Nasal Mucosa; Nasal Swab  Result Value Ref Range   MRSA, PCR NEGATIVE NEGATIVE   Staphylococcus aureus NEGATIVE NEGATIVE    Comment: (NOTE) The Xpert SA Assay (FDA approved for NASAL specimens in patients 71 years of age and older), is one component of a comprehensive surveillance program. It is not intended to diagnose infection nor to guide or monitor treatment. Performed at [facility], [place] [address], [place], [place] [address]   POCT HgB A1C  Status: Abnormal   Collection Time: 06/01/20  7:28 AM  Result Value Ref Range   Hemoglobin A1C 6.1 (A) 4.0 - 5.6 %   HbA1c POC (<> result, manual entry)     HbA1c, POC (prediabetic range)     HbA1c, POC (controlled diabetic range)      No results found.     All questions at time of visit were answered - patient instructed to contact office with any additional concerns or updates. ER/RTC precautions were reviewed with the patient as applicable.   Please note: manual typing as well as voice recognition software may have been used to produce this document - typos may escape review. Please contact Dr. Sheppard Coil for any needed  clarifications.

## 2020-06-02 ENCOUNTER — Encounter: Payer: Self-pay | Admitting: Physical Therapy

## 2020-06-02 ENCOUNTER — Ambulatory Visit: Payer: BC Managed Care – PPO | Admitting: Physical Therapy

## 2020-06-02 DIAGNOSIS — M6281 Muscle weakness (generalized): Secondary | ICD-10-CM

## 2020-06-02 DIAGNOSIS — M25512 Pain in left shoulder: Secondary | ICD-10-CM

## 2020-06-02 DIAGNOSIS — R293 Abnormal posture: Secondary | ICD-10-CM | POA: Diagnosis not present

## 2020-06-02 NOTE — Therapy (Signed)
[place], [place], [address] Phone: [phone]   Fax:  [phone]  Physical Therapy Treatment  Patient Details  Name: Sherri Gibson MRN: [id number] Date of Birth: [birth year] Referring Provider (PT): Gloriann Loan, [place]   Encounter Date: 06/02/2020   PT End of Session - 06/02/20 0935    Visit Number 6    Number of Visits 16    Date for PT Re-Evaluation 07/01/20    PT Start Time 0932    PT Stop Time 1013    PT Time Calculation (min) 41 min    Activity Tolerance Patient tolerated treatment well    Behavior During Therapy [facility] for tasks assessed/performed           Past Medical History:  Diagnosis Date  . Anemia    as a child  . Arthritis    Both Shoulders  . Breast cancer ([place]) 2016   Left   . Complication of anesthesia    states she had a hard time staying awake after hand surgery  . COPD (chronic obstructive pulmonary disease) (HCC)    uses O2 at 2l at night  . High blood pressure   . High cholesterol   . History of kidney stones   . Personal history of radiation therapy 2016   Left Breast Cancer  . Pneumonia    as a child  . Prediabetes     Past Surgical History:  Procedure Laterality Date  . BREAST BIOPSY    . BREAST LUMPECTOMY Left 2021  . BREAST LUMPECTOMY WITH AXILLARY LYMPH NODE BIOPSY  2016  . COLONOSCOPY  2017   [facility]. think there was colon polyps  . HAND SURGERY Left   . PARTIAL HYSTERECTOMY    . TOTAL SHOULDER ARTHROPLASTY Left 04/12/2020   Procedure: LEFT REVERSE TOTAL SHOULDER ARTHROPLASTY;  Surgeon: Meredith Pel, MD;  Location: [facility];  Service: Orthopedics;  Laterality: Left;    There were no vitals filed for this visit.   Subjective Assessment - 06/02/20 0939    Subjective Pt reports that folding laundry is a "real pain, because her Lt arm won't do what she wants it to".  (arm abdcts instead of  flexion)    Pertinent History HTN, arthritis, h/o L breast CA with lumpectomy, diabetes.    Patient Stated Goals Before my surgery, I couldn't lift my arm at all.  I'd like to be able to reach something on the top shelf.    Currently in Pain? No/denies              [facility] PT Assessment - 06/02/20 0001      Assessment   Medical Diagnosis L reverse total shoulder arthroplasty    Referring Provider (PT) Gloriann Loan, PA    Onset Date/Surgical Date 04/12/20    Hand Dominance Right    Next MD Visit 6 weeks from 05/25/20      AROM   Overall AROM Comments standing, without assistance    Left Shoulder Extension 30 Degrees    Left Shoulder Flexion 122 Degrees   130 with wall slide   Left Shoulder ABduction 110 Degrees   scaption   Left Shoulder External Rotation 42 Degrees   AAROM supine          [facility] Adult PT Treatment/Exercise - 06/02/20 0001      Shoulder Exercises: Supine   External Rotation AAROM;Left;10 reps   cane, 5-10 sec hold  Shoulder Exercises: Standing   ABduction AAROM;Left;10 reps   with dowel; cues for thumb Up   Extension Strengthening;Both;10 reps   3 sec pause with scap retraction. arms to neutral at side.   Theraband Level (Shoulder Extension) Level 1 (Yellow)    Row Strengthening;Both;10 reps   to elbows at neutral.   Theraband Level (Shoulder Row) Level 1 (Yellow)    Other Standing Exercises wall ladder flexion with LUE x 5 reps, cues on form      Shoulder Exercises: Pulleys   Flexion 2 minutes    Scaption --   unable to tolerate due to Rt shoulder pain.     Shoulder Exercises: Isometric Strengthening   Flexion 5X5"    External Rotation 5X5"    Internal Rotation 5X5"    ABduction 5X5"      Shoulder Exercises: Stretch   Star Gazer Stretch 1 rep;20 seconds   hands on top of head, to tolerance     Manual Therapy   Manual Therapy Soft tissue mobilization;Taping;Myofascial release    Myofascial Release Lt pec release.    Passive ROM Lt shoulder  into scaption, gentle    Kinesiotex IT sales professional I strip of sensitive skin Rock tape place over Lt shoulder incision with zig zag pattern to assist in scar management.                  PT Education - 06/02/20 1130    Education Details info regarding safe removal and rationale of ktape application.    Person(s) Educated Patient    Methods Explanation    Comprehension Verbalized understanding            PT Short Term Goals - 05/23/20 0720      PT SHORT TERM GOAL #1   Title The patient will be indep with initial HEP.    Time 4    Period Weeks    Status Achieved    Target Date 06/01/20      PT SHORT TERM GOAL #2   Title The patient will improve L shoulder P/ROM to 130 degrees.    Baseline 105 degrees up to 142 today for flexion.    Time 4    Period Weeks    Status Achieved    Target Date 06/01/20      PT SHORT TERM GOAL #3   Title The patient will demonstrate reaching to shoulder height shelf for improved muscle control for AROM.    Time 4    Period Weeks    Status Achieved    Target Date 06/01/20      PT SHORT TERM GOAL #4   Title The patient will tolerate low intensity isometrics (as protocol allows) with pain < or equal to 2/10.    Time 4    Period Weeks    Status Achieved    Target Date 06/01/20             PT Long Term Goals - 05/26/20 1256      PT LONG TERM GOAL #1   Title The patient will be indep with progression of HEP.    Time 8    Period Weeks    Status On-going      PT LONG TERM GOAL #2   Title The patient will improve functional status score from 30% up to 58%.    Time 8    Period Weeks    Status On-going  PT LONG TERM GOAL #3   Title The patient will be able to reach a shelf that is over shoulder height.    Time 8    Period Weeks    Status On-going      PT LONG TERM GOAL #4   Title The patient will have L shoulder ER to > or equal to 50 degrees in supine arm abducted to 45-- as protocol  allows.    Time 8    Period Weeks    Status On-going      PT LONG TERM GOAL #5   Title The patient will be able to reach head for hygeine activities with L UE.    Time 8    Period Weeks    Status Achieved                 Plan - 06/02/20 0944    Clinical Impression Statement Pt is now 7 wks s/p reverse Lt TSA.  She had difficulty tolerating pulleys due to pain in the RUE with pulling down.  Improved Lt shoulder ER ROM with AAROM in supine with cane.  Pt given cues on form and posture throughout, as well as frequent cues to slow the speed of the exercise to allow for tissues to strengthening or stretch.  Progressing well towards remaining goals.    Comorbidities L breast lumpectomy, diabetes, HTN, h/o arthritis.    Rehab Potential Good    PT Frequency 2x / week    PT Duration 8 weeks    PT Treatment/Interventions ADLs/Self Care Home Management;Vasopneumatic Device;Electrical Stimulation;Cryotherapy;Moist Heat;Therapeutic activities;Therapeutic exercise;Manual techniques;Patient/family education    PT Next Visit Plan progress AROM as tolerated, progress strengthening for functional UE use    PT Home Exercise Plan Access Code: [email or web address]    Consulted and Agree with Plan of Care Patient           Patient will benefit from skilled therapeutic intervention in order to improve the following deficits and impairments:  Pain,Impaired flexibility,Increased fascial restricitons,Decreased range of motion,Decreased strength,Postural dysfunction,Decreased activity tolerance,Increased edema,Decreased scar mobility  Visit Diagnosis: Left shoulder pain, unspecified chronicity  Muscle weakness (generalized)  Abnormal posture     Problem List Patient Active Problem List   Diagnosis Date Noted  . Arthritis of left shoulder region   . 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 Encounter for screening for lung cancer 04/13/2019  . Primary osteoarthritis of both shoulders  03/06/2019  . Chronic hypoxemic respiratory failure ([place]) 12/22/2018  . Osteopenia 12/20/2017  . Prediabetes 09/23/2017  . Colonoscopy refused 05/29/2017  . COPD, severe ([place]) 05/29/2017  . Multiple pulmonary nodules 05/29/2017  . Trigger thumb, right thumb 05/29/2017  . Primary osteoarthritis of first carpometacarpal joint of left hand 05/29/2017  . Kidney lesion 12/14/2016  . Erythrocytosis 11/13/2016  . Breast cancer ([place]) 09/20/2016  . Essential hypertension 09/20/2016  . Insomnia 09/20/2016  . Mixed hyperlipidemia 09/20/2016    Kerin Perna, PTA 06/02/20 11:32 AM  [place], [place], [address] Phone: [phone]   Fax:  [phone]  Name: Sherri Gibson MRN: [id number] Date of Birth: [birth year]

## 2020-06-08 ENCOUNTER — Encounter: Payer: BC Managed Care – PPO | Admitting: Physical Therapy

## 2020-06-14 ENCOUNTER — Other Ambulatory Visit: Payer: Self-pay

## 2020-06-14 ENCOUNTER — Encounter: Payer: Self-pay | Admitting: Rehabilitative and Restorative Service Providers"

## 2020-06-14 ENCOUNTER — Ambulatory Visit: Payer: BC Managed Care – PPO | Admitting: Rehabilitative and Restorative Service Providers"

## 2020-06-14 DIAGNOSIS — M25512 Pain in left shoulder: Secondary | ICD-10-CM | POA: Diagnosis not present

## 2020-06-14 DIAGNOSIS — R600 Localized edema: Secondary | ICD-10-CM | POA: Diagnosis not present

## 2020-06-14 DIAGNOSIS — M6281 Muscle weakness (generalized): Secondary | ICD-10-CM

## 2020-06-14 DIAGNOSIS — R293 Abnormal posture: Secondary | ICD-10-CM

## 2020-06-14 NOTE — Therapy (Signed)
[place], [place], [address] Phone: [phone]   Fax:  [phone]  Physical Therapy Treatment  Patient Details  Name: Sherri Gibson MRN: [id number] Date of Birth: [birth year] Referring Provider (PT): Gloriann Loan, [place]   Encounter Date: 06/14/2020   PT End of Session - 06/14/20 0935    Visit Number 7    Number of Visits 16    Date for PT Re-Evaluation 07/01/20    PT Start Time 0933    PT Stop Time 1015    PT Time Calculation (min) 42 min    Activity Tolerance Patient tolerated treatment well    Behavior During Therapy [facility] for tasks assessed/performed           Past Medical History:  Diagnosis Date  . Anemia    as a child  . Arthritis    Both Shoulders  . Breast cancer ([place]) 2016   Left   . Complication of anesthesia    states she had a hard time staying awake after hand surgery  . COPD (chronic obstructive pulmonary disease) (HCC)    uses O2 at 2l at night  . High blood pressure   . High cholesterol   . History of kidney stones   . Personal history of radiation therapy 2016   Left Breast Cancer  . Pneumonia    as a child  . Prediabetes     Past Surgical History:  Procedure Laterality Date  . BREAST BIOPSY    . BREAST LUMPECTOMY Left 2021  . BREAST LUMPECTOMY WITH AXILLARY LYMPH NODE BIOPSY  2016  . COLONOSCOPY  2017   [facility]. think there was colon polyps  . HAND SURGERY Left   . PARTIAL HYSTERECTOMY    . TOTAL SHOULDER ARTHROPLASTY Left 04/12/2020   Procedure: LEFT REVERSE TOTAL SHOULDER ARTHROPLASTY;  Surgeon: Meredith Pel, MD;  Location: [facility];  Service: Orthopedics;  Laterality: Left;    There were no vitals filed for this visit.   Subjective Assessment - 06/14/20 0935    Subjective The patient gets fatigued with home exercises.  "I struggle toward the end of the exercises."    Pertinent History HTN,  arthritis, h/o L breast CA with lumpectomy, diabetes.    Patient Stated Goals Before my surgery, I couldn't lift my arm at all.  I'd like to be able to reach something on the top shelf.    Currently in Pain? Yes    Pain Score 1     Pain Location Shoulder    Pain Orientation Left    Pain Descriptors / Indicators Sore    Pain Type Surgical pain    Pain Onset More than a month ago    Pain Frequency Intermittent    Aggravating Factors  tired and sore with ther ex    Pain Relieving Factors rest              [facility] PT Assessment - 06/14/20 0936      Assessment   Medical Diagnosis L reverse total shoulder arthroplasty    Referring Provider (PT) Gloriann Loan, PA    Onset Date/Surgical Date 04/12/20    Hand Dominance Right      AROM   Left Shoulder Flexion 120 Degrees   in sitting without support   Left Shoulder External Rotation 42 Degrees  [facility] Adult PT Treatment/Exercise - 06/14/20 0941      Exercises   Exercises Shoulder;Elbow      Shoulder Exercises: Supine   Protraction AROM;Strengthening;Left;12 reps    External Rotation AROM;PROM    Diagonals Strengthening;Left;10 reps    Diagonals Limitations D2    Other Supine Exercises shoulder circles CW and CCW x 30 seconds    Other Supine Exercises chest press with 2 lbs x 6 reps-- has anterior shoulder popping      Shoulder Exercises: Prone   Retraction Strengthening;Left;10 reps    Extension Strengthening;Left;10 reps      Shoulder Exercises: Sidelying   External Rotation Strengthening;Left;5 reps    External Rotation Limitations needs cues to rotate at shoulder versus straighten elbow-- challenging motion for patinet      Shoulder Exercises: Standing   Flexion AROM;Left;10 reps    Flexion Limitations to shoulder height/ then did scaption x 10 reps    Extension Strengthening;Both;10 reps    Extension Limitations wiht a dowel    Other Standing Exercises Physioball rolling into  flexion and scaption for warm up (pulleys hurt intact side, therefore warmed up with ball)      Shoulder Exercises: ROM/Strengthening   Rhythmic Stabilization, Supine with mild pain      Shoulder Exercises: Stretch   Other Shoulder Stretches thoracic extension over towel roll with bilat UEs abducted to 45 degrees *tightness in L pec musculature      Manual Therapy   Manual Therapy Soft tissue mobilization    Soft tissue mobilization STM and IASTM R biceps, anterior shoulder musculature, deltoid    Myofascial Release Lt pec release.    Kinesiotex IT sales professional I strip of sensitive skin Rock tape place over Lt shoulder incision with zig zag pattern to assist in scar management.                  PT Education - 06/14/20 1547    Education Details HEP    Person(s) Educated Patient    Methods Explanation;Demonstration;Handout    Comprehension Verbalized understanding            PT Short Term Goals - 05/23/20 0720      PT SHORT TERM GOAL #1   Title The patient will be indep with initial HEP.    Time 4    Period Weeks    Status Achieved    Target Date 06/01/20      PT SHORT TERM GOAL #2   Title The patient will improve L shoulder P/ROM to 130 degrees.    Baseline 105 degrees up to 142 today for flexion.    Time 4    Period Weeks    Status Achieved    Target Date 06/01/20      PT SHORT TERM GOAL #3   Title The patient will demonstrate reaching to shoulder height shelf for improved muscle control for AROM.    Time 4    Period Weeks    Status Achieved    Target Date 06/01/20      PT SHORT TERM GOAL #4   Title The patient will tolerate low intensity isometrics (as protocol allows) with pain < or equal to 2/10.    Time 4    Period Weeks    Status Achieved    Target Date 06/01/20             PT Long Term Goals - 05/26/20 1256  PT LONG TERM GOAL #1   Title The patient will be indep with progression of HEP.    Time 8     Period Weeks    Status On-going      PT LONG TERM GOAL #2   Title The patient will improve functional status score from 30% up to 58%.    Time 8    Period Weeks    Status On-going      PT LONG TERM GOAL #3   Title The patient will be able to reach a shelf that is over shoulder height.    Time 8    Period Weeks    Status On-going      PT LONG TERM GOAL #4   Title The patient will have L shoulder ER to > or equal to 50 degrees in supine arm abducted to 45-- as protocol allows.    Time 8    Period Weeks    Status On-going      PT LONG TERM GOAL #5   Title The patient will be able to reach head for hygeine activities with L UE.    Time 8    Period Weeks    Status Achieved                 Plan - 06/14/20 1547    Clinical Impression Statement The patient is demonstrating less upper trap compensation this week during scaption and flexion to shoulder height.  PT continuing to progress ther ex to patient tolerance.    PT Treatment/Interventions ADLs/Self Care Home Management;Vasopneumatic Device;Electrical Stimulation;Cryotherapy;Moist Heat;Therapeutic activities;Therapeutic exercise;Manual techniques;Patient/family education    PT Next Visit Plan progress AROM as tolerated, progress strengthening for functional UE use    PT Home Exercise Plan Access Code: [email or web address]    Consulted and Agree with Plan of Care Patient           Patient will benefit from skilled therapeutic intervention in order to improve the following deficits and impairments:     Visit Diagnosis: Left shoulder pain, unspecified chronicity  Muscle weakness (generalized)  Abnormal posture  Localized edema     Problem List Patient Active Problem List   Diagnosis Date Noted  . Arthritis of left shoulder region   . 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 Encounter for screening for lung cancer 04/13/2019  . Primary osteoarthritis of both shoulders 03/06/2019  . Chronic hypoxemic  respiratory failure ([place]) 12/22/2018  . Osteopenia 12/20/2017  . Prediabetes 09/23/2017  . Colonoscopy refused 05/29/2017  . COPD, severe ([place]) 05/29/2017  . Multiple pulmonary nodules 05/29/2017  . Trigger thumb, right thumb 05/29/2017  . Primary osteoarthritis of first carpometacarpal joint of left hand 05/29/2017  . Kidney lesion 12/14/2016  . Erythrocytosis 11/13/2016  . Breast cancer ([place]) 09/20/2016  . Essential hypertension 09/20/2016  . Insomnia 09/20/2016  . Mixed hyperlipidemia 09/20/2016    Ashlinn Hemrick , PT 06/14/2020, 3:50 PM  [facility] [place], [place], [address] Phone: [phone]   Fax:  [phone]  Name: Sherri Gibson MRN: [id number] Date of Birth: [birth year]

## 2020-06-14 NOTE — Patient Instructions (Signed)
Access Code: [email or web address] URL: [email or web address] Date: 06/14/2020 Prepared by: Rudell Cobb  Exercises Supine Cervical Rotation AROM on Pillow - 2 x daily - 7 x weekly - 1 sets - 10 reps Supine Chin Tuck - 2 x daily - 7 x weekly - 1 sets - 10 reps Supine Single Arm Shoulder External Rotation AROM - 2 x daily - 7 x weekly - 1 sets - 10 reps Standing Shoulder Flexion AAROM with Dowel - 2 x daily - 7 x weekly - 1 sets - 5 reps Standing Shoulder Abduction AAROM with Dowel - 2 x daily - 7 x weekly - 1 sets - 5 reps Standing Shoulder Scaption - 2 x daily - 7 x weekly - 1 sets - 10 reps Standing Shoulder Flexion to 90 Degrees - 2 x daily - 7 x weekly - 1 sets - 10 reps Standing Isometric Shoulder Flexion with Doorway - Arm Bent - 2 x daily - 7 x weekly - 1 sets - 10 reps Standing Isometric Shoulder Abduction with Doorway - Arm Bent - 2 x daily - 7 x weekly - 1 sets - 10 reps Standing Isometric Shoulder Internal Rotation with Towel Roll at Doorway - 2 x daily - 7 x weekly - 1 sets - 10 reps Standing Shoulder Extension with Resistance - 2 x daily - 7 x weekly - 1 sets - 10 reps

## 2020-06-20 ENCOUNTER — Encounter: Payer: BC Managed Care – PPO | Admitting: Physical Therapy

## 2020-06-20 ENCOUNTER — Other Ambulatory Visit: Payer: Self-pay

## 2020-06-20 ENCOUNTER — Telehealth: Payer: Self-pay | Admitting: Physical Therapy

## 2020-06-20 NOTE — Telephone Encounter (Signed)
Patient did not show for PT appointment. Called patient and spoke with patient; stated that she forgot the appt.  Confirmed next upcoming appt with her.   Kerin Perna, PTA 06/20/20 9:48 AM

## 2020-06-23 ENCOUNTER — Other Ambulatory Visit: Payer: Self-pay | Admitting: Osteopathic Medicine

## 2020-06-25 ENCOUNTER — Encounter: Payer: Self-pay | Admitting: Physical Therapy

## 2020-06-25 DIAGNOSIS — J449 Chronic obstructive pulmonary disease, unspecified: Secondary | ICD-10-CM | POA: Diagnosis not present

## 2020-06-28 ENCOUNTER — Ambulatory Visit (INDEPENDENT_AMBULATORY_CARE_PROVIDER_SITE_OTHER): Payer: BC Managed Care – PPO | Admitting: Physical Therapy

## 2020-06-28 ENCOUNTER — Other Ambulatory Visit: Payer: Self-pay

## 2020-06-28 DIAGNOSIS — M25512 Pain in left shoulder: Secondary | ICD-10-CM | POA: Diagnosis not present

## 2020-06-28 DIAGNOSIS — M6281 Muscle weakness (generalized): Secondary | ICD-10-CM | POA: Diagnosis not present

## 2020-06-28 DIAGNOSIS — R293 Abnormal posture: Secondary | ICD-10-CM

## 2020-06-28 NOTE — Therapy (Signed)
[place], [place], [address] Phone: [phone]   Fax:  [phone]  Physical Therapy Treatment  Patient Details  Name: Sherri Gibson MRN: [id number] Date of Birth: 12-04-1960 Referring Provider (PT): Gloriann Loan, [place]   Encounter Date: 06/28/2020   PT End of Session - 06/28/20 0804    Visit Number 8    Number of Visits 16    Date for PT Re-Evaluation 07/01/20    PT Start Time 0801    PT Stop Time 0839    PT Time Calculation (min) 38 min    Activity Tolerance Patient tolerated treatment well    Behavior During Therapy [facility] for tasks assessed/performed           Past Medical History:  Diagnosis Date  . Anemia    as a child  . Arthritis    Both Shoulders  . Breast cancer ([place]) 2016   Left   . Complication of anesthesia    states she had a hard time staying awake after hand surgery  . COPD (chronic obstructive pulmonary disease) (HCC)    uses O2 at 2l at night  . High blood pressure   . High cholesterol   . History of kidney stones   . Personal history of radiation therapy 2016   Left Breast Cancer  . Pneumonia    as a child  . Prediabetes     Past Surgical History:  Procedure Laterality Date  . BREAST BIOPSY    . BREAST LUMPECTOMY Left 2021  . BREAST LUMPECTOMY WITH AXILLARY LYMPH NODE BIOPSY  2016  . COLONOSCOPY  2017   [facility]. think there was colon polyps  . HAND SURGERY Left   . PARTIAL HYSTERECTOMY    . TOTAL SHOULDER ARTHROPLASTY Left 04/12/2020   Procedure: LEFT REVERSE TOTAL SHOULDER ARTHROPLASTY;  Surgeon: Meredith Pel, MD;  Location: [place];  Service: Orthopedics;  Laterality: Left;    There were no vitals filed for this visit.   Subjective Assessment - 06/28/20 0910    Subjective Pt reports she had some irritation from the tape last session.  She is doing well with the exercises.  She has some concerns  about just using LUE after Rt shoulder surgery in July.    Pertinent History HTN, arthritis, h/o L breast CA with lumpectomy, diabetes.    Patient Stated Goals Before my surgery, I couldn't lift my arm at all.  I'd like to be able to reach something on the top shelf.    Currently in Pain? No/denies    Pain Score 0-No pain    Pain Onset More than a month ago              [facility] PT Assessment - 06/28/20 0001      Assessment   Medical Diagnosis L reverse total shoulder arthroplasty    Referring Provider (PT) Gloriann Loan, PA    Onset Date/Surgical Date 04/12/20    Hand Dominance Right    Next MD Visit 07/06/20      PROM   Left Shoulder External Rotation 52 Degrees   45 deg supported scaption            [facility] Adult PT Treatment/Exercise - 06/28/20 0001      Shoulder Exercises: Supine   Protraction Strengthening;Left;10 reps   1#, with forward flexion.  arm on pillow up return.   Horizontal ABduction Left;10 reps   forward flexion  90 deg to scaption 90   Horizontal ABduction Weight (lbs) 1    Flexion Strengthening;Left;10 reps    Shoulder Flexion Weight (lbs) 1    ABduction Left;10 reps    Shoulder ABduction Weight (lbs) 1    Diagonals Left;10 reps;Weights    Diagonals Weight (lbs) 1#      Shoulder Exercises: Seated   Other Seated Exercises seated - rolling physioball forward / to side with Lt arm on ball for ROM.      Shoulder Exercises: Sidelying   External Rotation Strengthening;Left;10 reps   therapist stablizing arm to prevent compensation   External Rotation Limitations limited range      Shoulder Exercises: Standing   Flexion Strengthening;Left;10 reps   to shoulder height   Shoulder Flexion Weight (lbs) 1    ABduction Strengthening;Right;15 reps   scaption   Shoulder ABduction Weight (lbs) 1    Extension Strengthening;Left;10 reps   to neutral.   Theraband Level (Shoulder Extension) Level 1 (Yellow)    Diagonals Limitations Lt shoulder D2 ext with yellow  band x 10    Other Standing Exercises bent over row with LUE to neutral with 3# x 10.    Other Standing Exercises rings on rainbow x 11 Rt/Lt with Lt hand (top of arch at eye level), repeated 5 rings.  Lt arm finger walking into flexion stretch x 3 reps       Manual Therapy   Myofascial Release Lt pec release.                    PT Short Term Goals - 05/23/20 0720      PT SHORT TERM GOAL #1   Title The patient will be indep with initial HEP.    Time 4    Period Weeks    Status Achieved    Target Date 06/01/20      PT SHORT TERM GOAL #2   Title The patient will improve L shoulder P/ROM to 130 degrees.    Baseline 105 degrees up to 142 today for flexion.    Time 4    Period Weeks    Status Achieved    Target Date 06/01/20      PT SHORT TERM GOAL #3   Title The patient will demonstrate reaching to shoulder height shelf for improved muscle control for AROM.    Time 4    Period Weeks    Status Achieved    Target Date 06/01/20      PT SHORT TERM GOAL #4   Title The patient will tolerate low intensity isometrics (as protocol allows) with pain < or equal to 2/10.    Time 4    Period Weeks    Status Achieved    Target Date 06/01/20             PT Long Term Goals - 06/28/20 0936      PT LONG TERM GOAL #1   Title The patient will be indep with progression of HEP.    Time 8    Period Weeks    Status On-going      PT LONG TERM GOAL #2   Title The patient will improve functional status score from 30% up to 58%.    Time 8    Period Weeks    Status On-going      PT LONG TERM GOAL #3   Title The patient will be able to reach a shelf that is over shoulder height.  Time 8    Period Weeks    Status On-going      PT LONG TERM GOAL #4   Title The patient will have L shoulder ER to > or equal to 50 degrees in supine arm abducted to 45-- as protocol allows.    Time 8    Period Weeks    Status Achieved      PT LONG TERM GOAL #5   Title The patient will be  able to reach head for hygeine activities with L UE.    Time 8    Period Weeks    Status Achieved                 Plan - 06/28/20 0929    Clinical Impression Statement Pt tolerated exercises well, without any pain in Lt shoulder.  Gently progressing ROM/ strengthening with protocol limits. Has met LTG#4.  Pt is 11 wks s/p reverse TSA today.  Returns to MD next wk.    Rehab Potential Good    PT Frequency 2x / week    PT Duration 8 weeks    PT Treatment/Interventions ADLs/Self Care Home Management;Vasopneumatic Device;Electrical Stimulation;Cryotherapy;Moist Heat;Therapeutic activities;Therapeutic exercise;Manual techniques;Patient/family education    PT Next Visit Plan progress AROM as tolerated, progress strengthening for functional UE use. MD note.    PT Home Exercise Plan Access Code: [email or web address]    Consulted and Agree with Plan of Care Patient           Patient will benefit from skilled therapeutic intervention in order to improve the following deficits and impairments:     Visit Diagnosis: Left shoulder pain, unspecified chronicity  Muscle weakness (generalized)  Abnormal posture     Problem List Patient Active Problem List   Diagnosis Date Noted  . Arthritis of left shoulder region   . 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 Encounter for screening for lung cancer 04/13/2019  . Primary osteoarthritis of both shoulders 03/06/2019  . Chronic hypoxemic respiratory failure ([place]) 12/22/2018  . Osteopenia 12/20/2017  . Prediabetes 09/23/2017  . Colonoscopy refused 05/29/2017  . COPD, severe ([place]) 05/29/2017  . Multiple pulmonary nodules 05/29/2017  . Trigger thumb, right thumb 05/29/2017  . Primary osteoarthritis of first carpometacarpal joint of left hand 05/29/2017  . Kidney lesion 12/14/2016  . Erythrocytosis 11/13/2016  . Breast cancer ([place]) 09/20/2016  . Essential hypertension 09/20/2016  . Insomnia 09/20/2016  . Mixed hyperlipidemia  09/20/2016   Kerin Perna, PTA 06/28/20 9:40 AM  [facility] [place], [place], [address] Phone: [phone]   Fax:  [phone]  Name: Sherri Gibson MRN: [id number] Date of Birth: 1960/08/25

## 2020-06-29 ENCOUNTER — Encounter: Payer: BC Managed Care – PPO | Admitting: Physical Therapy

## 2020-07-05 ENCOUNTER — Ambulatory Visit: Payer: BC Managed Care – PPO | Admitting: Physical Therapy

## 2020-07-05 ENCOUNTER — Other Ambulatory Visit: Payer: Self-pay

## 2020-07-05 DIAGNOSIS — R293 Abnormal posture: Secondary | ICD-10-CM | POA: Diagnosis not present

## 2020-07-05 DIAGNOSIS — M25512 Pain in left shoulder: Secondary | ICD-10-CM | POA: Diagnosis not present

## 2020-07-05 DIAGNOSIS — M6281 Muscle weakness (generalized): Secondary | ICD-10-CM

## 2020-07-05 NOTE — Patient Instructions (Signed)
Access Code: [email or web address] URL: [email or web address] Date: 07/05/2020 Prepared by: [place]  Exercises Supine Shoulder External Rotation in 45 Degrees Abduction AAROM with [place] - 1 x daily - 7 x weekly - 1 sets - 10 reps - 5 hold Standing Single Arm Elbow Flexion with Resistance - 1 x daily - 7 x weekly - 3 sets - 10 reps Standing Shoulder Extension with Resistance - 2 x daily - 7 x weekly - 1 sets - 10 reps Supine PNF D2 Flexion with Resistance - 1 x daily - 7 x weekly - 1 sets - 10 reps Single Arm Doorway Pec Stretch at 90 Degrees Abduction - 2-3 x daily - 7 x weekly - 1 sets - 2-3 reps - 15 seconds hold Standing Single Shoulder Flexion Wall Slide with Palm Up - 2 x daily - 7 x weekly - 1 sets - 5 reps - 10 hold

## 2020-07-05 NOTE — Therapy (Addendum)
[place], [place], [address] Phone: [phone]   Fax:  [phone]  Physical Therapy Treatment and Renewal Summary + Discharge Summary  Patient Details  Name: Sherri Gibson MRN: [id number] Date of Birth: 16-Dec-1960 Referring Provider (PT): Gloriann Loan, [place]   Encounter Date: 07/05/2020 PHYSICAL THERAPY DISCHARGE SUMMARY  Visits from Start of Care: 9  Current functional level related to goals / functional outcomes: See goals below   Remaining deficits: See below-- patient has met PT goals Education / Equipment: HEP   Patient agrees to discharge. Patient goals were met. Patient is being discharged due to meeting the stated rehab goals.   PT End of Session - 07/05/20 0854     Visit Number 9    Number of Visits 16    Date for PT Re-Evaluation 07/01/20    PT Start Time 0848    PT Stop Time 0930    PT Time Calculation (min) 42 min    Activity Tolerance Patient tolerated treatment well    Behavior During Therapy [facility] for tasks assessed/performed             Past Medical History:  Diagnosis Date   Anemia    as a child   Arthritis    Both Shoulders   Breast cancer ([place]) 0630   Left    Complication of anesthesia    states she had a hard time staying awake after hand surgery   COPD (chronic obstructive pulmonary disease) ([place])    uses O2 at 2l at night   High blood pressure    High cholesterol    History of kidney stones    Personal history of radiation therapy 2016   Left Breast Cancer   Pneumonia    as a child   Prediabetes     Past Surgical History:  Procedure Laterality Date   BREAST BIOPSY     BREAST LUMPECTOMY Left 2021   BREAST LUMPECTOMY WITH AXILLARY LYMPH NODE BIOPSY  2016   COLONOSCOPY  2017   [facility]. think there was colon polyps   HAND SURGERY Left    PARTIAL HYSTERECTOMY     TOTAL SHOULDER ARTHROPLASTY Left  04/12/2020   Procedure: LEFT REVERSE TOTAL SHOULDER ARTHROPLASTY;  Surgeon: Meredith Pel, MD;  Location: [facility] [place];  Service: Orthopedics;  Laterality: Left;    There were no vitals filed for this visit.   Subjective Assessment - 07/05/20 0851     Subjective Pt reports she fell yesterday and caught herself with her Rt arm.  She has concerns about not being able to buckle herself in with her LUE due to her buckle location in seat.  Also worried about only using LUE to drive after Rt shoulder surgery.    Patient Stated Goals Before my surgery, I couldn't lift my arm at all.  I'd like to be able to reach something on the top shelf.    Currently in Pain? Yes    Pain Score 9     Pain Location Shoulder    Pain Orientation Right    Pain Descriptors / Indicators Sore                [facility] PT Assessment - 07/05/20 0001       Assessment   Medical Diagnosis L reverse total shoulder arthroplasty    Referring Provider (PT) Gloriann Loan, PA    Onset Date/Surgical Date 04/12/20    Hand  Dominance Right    Next MD Visit 07/06/20      Observation/Other Assessments   Focus on Therapeutic Outcomes (FOTO)  68% functional status      AROM   Left Shoulder Extension 35 Degrees    Left Shoulder Flexion 125 Degrees    Left Shoulder ABduction 115 Degrees      PROM   Left Shoulder External Rotation 52 Degrees   45 deg supported scaption              [facility] Adult PT Treatment/Exercise - 07/05/20 0001       Shoulder Exercises: Supine   External Rotation AAROM;Left;10 reps   cane, scaption   Flexion AAROM;Both;5 reps   cane   Diagonals Strengthening;Left;10 reps   yellow x 6, red x 6.     Shoulder Exercises: Seated   Other Seated Exercises elbow flexion with yellow band x 5, red band x 10    Other Seated Exercises reviewed (in car) how to better buckle seat belt with only using Lt hand (to prepare for Rt shoulder surgery).      Shoulder Exercises: Standing   External Rotation  Strengthening;Left;5 reps   limited motion   Extension Left;10 reps;Strengthening   to neutral   Theraband Level (Shoulder Extension) Level 1 (Yellow)    Other Standing Exercises rings on arch Lt to/from Rt (11) with 1.5# wt on Lt wrist for half of reps, 2 sets      Shoulder Exercises: Stretch   Wall Stretch - Flexion 1 rep    Other Shoulder Stretches single arm L pec stretch on door frame x 3 reps of 15 sec    Other Shoulder Stretches wall ladder, LUE, flexion x 5 reps                    PT Education - 07/05/20 0944     Education Details HEP updated.  issued red band    Person(s) Educated Patient    Methods Explanation;Demonstration;Verbal cues;Handout    Comprehension Verbalized understanding;Returned demonstration              PT Short Term Goals - 05/23/20 0720       PT SHORT TERM GOAL #1   Title The patient will be indep with initial HEP.    Time 4    Period Weeks    Status Achieved    Target Date 06/01/20      PT SHORT TERM GOAL #2   Title The patient will improve L shoulder P/ROM to 130 degrees.    Baseline 105 degrees up to 142 today for flexion.    Time 4    Period Weeks    Status Achieved    Target Date 06/01/20      PT SHORT TERM GOAL #3   Title The patient will demonstrate reaching to shoulder height shelf for improved muscle control for AROM.    Time 4    Period Weeks    Status Achieved    Target Date 06/01/20      PT SHORT TERM GOAL #4   Title The patient will tolerate low intensity isometrics (as protocol allows) with pain < or equal to 2/10.    Time 4    Period Weeks    Status Achieved    Target Date 06/01/20               PT Long Term Goals - 07/05/20 0855       PT LONG TERM  GOAL #1   Title The patient will be indep with progression of HEP.    Time 8    Period Weeks    Status Partially Met      PT LONG TERM GOAL #2   Title The patient will improve functional status score from 30% up to 58%.    Baseline 68%    Time  8    Period Weeks    Status Achieved      PT LONG TERM GOAL #3   Title The patient will be able to reach a shelf that is over shoulder height.    Time 8    Period Weeks    Status Achieved      PT LONG TERM GOAL #4   Title The patient will have L shoulder ER to > or equal to 50 degrees in supine arm abducted to 45-- as protocol allows.    Time 8    Period Weeks    Status Achieved      PT LONG TERM GOAL #5   Title The patient will be able to reach head for hygeine activities with L UE.    Time 8    Period Weeks    Status Achieved             continue to unmet LTG:  PT Long Term Goals - 07/05/20 1525       PT LONG TERM GOAL #1   Title The patient will be indep with progression of HEP.    Time 6    Period Weeks    Target Date 08/16/20                   Plan - 07/05/20 0940     Clinical Impression Statement Pt is now 12 wks s/p reverse TSA.  Her Lt shoulder ROM has improved over last few weeks.  Continued gentle strengthening and issued additional band for exercise advancement.  FOTO functional score improved to 68%.  Addressed issue of buckling seat belt with LUE only; able to complete with scooting / rotating trunk to accommodate movement of Lt shoulder.  Pt has met most of her goals; requests to hold therapy until after follow up with surgeon.    Rehab Potential Good    PT Frequency 2x / week    PT Duration 8 weeks    PT Treatment/Interventions ADLs/Self Care Home Management;Vasopneumatic Device;Electrical Stimulation;Cryotherapy;Moist Heat;Therapeutic activities;Therapeutic exercise;Manual techniques;Patient/family education    PT Next Visit Plan hold per pt request.    PT Home Exercise Plan Access Code: [email or web address]    Consulted and Agree with Plan of Care Patient             Patient will benefit from skilled therapeutic intervention in order to improve the following deficits and impairments:  Pain,Impaired flexibility,Increased fascial  restricitons,Decreased range of motion,Decreased strength,Postural dysfunction,Decreased activity tolerance,Increased edema,Decreased scar mobility  Visit Diagnosis: Left shoulder pain, unspecified chronicity  Muscle weakness (generalized)  Abnormal posture     Problem List Patient Active Problem List   Diagnosis Date Noted   Arthritis of left shoulder region    S/P reverse total shoulder arthroplasty, 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   Encounter for screening for lung cancer 04/13/2019   Primary osteoarthritis of both shoulders 03/06/2019   Chronic hypoxemic respiratory failure ([place]) 12/22/2018   Osteopenia 12/20/2017   Prediabetes 09/23/2017   Colonoscopy refused 05/29/2017   COPD, severe ([place]) 05/29/2017   Multiple pulmonary nodules 05/29/2017   Trigger thumb, right thumb 05/29/2017  Primary osteoarthritis of first carpometacarpal joint of left hand 05/29/2017   Kidney lesion 12/14/2016   Erythrocytosis 11/13/2016   Breast cancer ([place]) 09/20/2016   Essential hypertension 09/20/2016   Insomnia 09/20/2016   Mixed hyperlipidemia 09/20/2016     WEAVER,CHRISTINA, PT  Rudell Cobb, MPT  Kerin Perna, PTA 07/05/20 9:52 AM  [facility] [place], [place], [address] Phone: [phone]   Fax:  [phone]  Name: Sherri Gibson MRN: [id number] Date of Birth: 07-08-1960

## 2020-07-06 ENCOUNTER — Encounter (HOSPITAL_COMMUNITY): Payer: Self-pay | Admitting: Gastroenterology

## 2020-07-06 ENCOUNTER — Ambulatory Visit (INDEPENDENT_AMBULATORY_CARE_PROVIDER_SITE_OTHER): Payer: BC Managed Care – PPO | Admitting: Orthopedic Surgery

## 2020-07-06 ENCOUNTER — Encounter: Payer: Self-pay | Admitting: Orthopedic Surgery

## 2020-07-06 DIAGNOSIS — M19011 Primary osteoarthritis, right shoulder: Secondary | ICD-10-CM

## 2020-07-06 DIAGNOSIS — M25511 Pain in right shoulder: Secondary | ICD-10-CM

## 2020-07-06 DIAGNOSIS — Z96612 Presence of left artificial shoulder joint: Secondary | ICD-10-CM

## 2020-07-06 NOTE — Progress Notes (Signed)
Post-Op Visit Note   Patient: Sherri Gibson           Date of Birth: Apr 18, 1960           MRN: [id number] Visit Date: 07/06/2020 PCP: Emeterio Reeve, DO   Assessment & Plan:  Chief Complaint:  Chief Complaint  Patient presents with   Left Shoulder - Follow-up   Right Shoulder - Pain   Visit Diagnoses:  1. Right shoulder pain, unspecified chronicity   2. Arthritis of right glenohumeral joint   3. History of arthroplasty of left shoulder     Plan: Patient is a 60 year old female who presents s/p left shoulder reverse shoulder arthroplasty on 04/12/2020.  She reports she is doing great regarding her left shoulder.  Her right shoulder is actually bothering her more than her left.  She does not have to take any medications for her left shoulder.  She has finished physical therapy and plans to continue with a home exercise program for her left shoulder.  On exam she has well-healed incision with no evidence of infection.  She has 25 degrees external rotation, 85 degrees abduction, 120 degrees forward flexion with good subscapularis strength.  Axillary nerve intact with deltoid firing.  She is able to lift her arm overhead and feels that she is continuing to make improvements.    Regarding her right shoulder she has history of years of pain in the right shoulder and her right shoulder was actually the first shoulder that started to bother her.  This pain in her right shoulder wakes her up at night whereas the left shoulder does not.  She has no history of prior surgery to the right shoulder.  Localizes most of her pain to the anterior lateral aspect of the shoulder and denies any radicular pain down the arm or any involvement of her neck.  She is able to actively forward flex the shoulder to 75 degrees but not able to lift up anymore due to pain.  She has intact rotator cuff strength on exam with axillary nerve intact and deltoid firing on the right-hand side.  Passive motion of the  right shoulder shows 20 degrees external rotation, 60 degrees abduction, 100 degrees forward flexion.  She is doing so well from the left shoulder surgery that she would like to pursue shoulder arthroplasty on the right-hand side.  She had to have a reverse replacement on the left due to glenoid deformity so potentially plan for anatomic total shoulder arthroplasty on the right-hand side as long as there is no significant glenoid deformity.  Radiographs reviewed which last radiographs were from a little over a year ago.  Definitely has moderate glenohumeral arthritis with inferior humeral head osteophyte and glenohumeral joint space narrowing.  Plan to order thin cut CT scan of the right shoulder for preoperative planning purposes in [place].  Follow-up after CT scan to review results and discuss posting for surgery.  Follow-Up Instructions: No follow-ups on file.   Orders:  Orders Placed This Encounter  Procedures   CT SHOULDER RIGHT WO CONTRAST   No orders of the defined types were placed in this encounter.   Imaging: No results found.  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   Encounter for screening for lung cancer 04/13/2019   Primary osteoarthritis of both shoulders 03/06/2019   Chronic hypoxemic respiratory failure ([place]) 12/22/2018  Osteopenia 12/20/2017   Prediabetes 09/23/2017   Colonoscopy refused 05/29/2017   COPD, severe ([place]) 05/29/2017   Multiple pulmonary nodules 05/29/2017   Trigger thumb, right thumb 05/29/2017   Primary osteoarthritis of first carpometacarpal joint of left hand 05/29/2017   Kidney lesion 12/14/2016   Erythrocytosis 11/13/2016   Breast cancer ([place]) 09/20/2016   Essential hypertension 09/20/2016   Insomnia 09/20/2016   Mixed hyperlipidemia 09/20/2016   Past Medical History:  Diagnosis Date   Anemia    as a child    Arthritis    Both Shoulders   Breast cancer ([place]) 0254   Left    Complication of anesthesia    states she had a hard time staying awake after hand surgery   COPD (chronic obstructive pulmonary disease) ([place])    uses O2 at 2l at night   High blood pressure    High cholesterol    History of kidney stones    Personal history of radiation therapy 2016   Left Breast Cancer   Pneumonia    as a child   Prediabetes     Family History  Problem Relation Age of Onset   Breast cancer Mother    Diabetes Brother    Colon cancer Neg Hx    Esophageal cancer Neg Hx     Past Surgical History:  Procedure Laterality Date   BREAST BIOPSY     BREAST LUMPECTOMY Left 2021   BREAST LUMPECTOMY WITH AXILLARY LYMPH NODE BIOPSY  2016   COLONOSCOPY  2017   [facility]. think there was colon polyps   HAND SURGERY Left    PARTIAL HYSTERECTOMY     TOTAL SHOULDER ARTHROPLASTY Left 04/12/2020   Procedure: LEFT REVERSE TOTAL SHOULDER ARTHROPLASTY;  Surgeon: Meredith Pel, MD;  Location: [facility];  Service: Orthopedics;  Laterality: Left;   Social History   Occupational History   Not on file  Tobacco Use   Smoking status: Former Smoker    Packs/day: 2.50    Years: 44.00    Pack years: 110.00    Types: Cigarettes    Quit date: 11/29/2016    Years since quitting: 3.6   Smokeless tobacco: Never Used  Vaping Use   Vaping Use: Never used  Substance and Sexual Activity   Alcohol use: Not Currently   Drug use: Never   Sexual activity: Yes    Partners: Male    Birth control/protection: None

## 2020-07-07 ENCOUNTER — Other Ambulatory Visit (HOSPITAL_COMMUNITY)
Admission: RE | Admit: 2020-07-07 | Discharge: 2020-07-07 | Disposition: A | Discharge status: Home / Self Care | Payer: BC Managed Care – PPO | Source: Ambulatory Visit | Attending: Gastroenterology | Admitting: Gastroenterology

## 2020-07-07 ENCOUNTER — Encounter (HOSPITAL_COMMUNITY): Payer: Self-pay | Admitting: Gastroenterology

## 2020-07-07 ENCOUNTER — Other Ambulatory Visit: Payer: Self-pay

## 2020-07-07 DIAGNOSIS — Z20822 Contact with and (suspected) exposure to covid-19: Secondary | ICD-10-CM | POA: Diagnosis not present

## 2020-07-07 DIAGNOSIS — Z01812 Encounter for preprocedural laboratory examination: Secondary | ICD-10-CM | POA: Insufficient documentation

## 2020-07-07 LAB — SARS CORONAVIRUS 2 (TAT 6-24 HRS): SARS Coronavirus 2: NEGATIVE

## 2020-07-08 ENCOUNTER — Encounter: Payer: Self-pay | Admitting: Orthopedic Surgery

## 2020-07-11 ENCOUNTER — Encounter (HOSPITAL_COMMUNITY): Payer: Self-pay | Admitting: Gastroenterology

## 2020-07-11 ENCOUNTER — Ambulatory Visit (HOSPITAL_COMMUNITY): Payer: BC Managed Care – PPO | Admitting: Anesthesiology

## 2020-07-11 ENCOUNTER — Encounter (HOSPITAL_COMMUNITY)
Admission: RE | Disposition: A | Discharge status: Home / Self Care | Payer: Self-pay | Source: Home / Self Care | Attending: Gastroenterology

## 2020-07-11 ENCOUNTER — Other Ambulatory Visit: Payer: Self-pay

## 2020-07-11 ENCOUNTER — Ambulatory Visit (HOSPITAL_COMMUNITY)
Admission: RE | Admit: 2020-07-11 | Discharge: 2020-07-11 | Disposition: A | Discharge status: Home / Self Care | Payer: BC Managed Care – PPO | Attending: Gastroenterology | Admitting: Gastroenterology

## 2020-07-11 DIAGNOSIS — Z9981 Dependence on supplemental oxygen: Secondary | ICD-10-CM | POA: Diagnosis not present

## 2020-07-11 DIAGNOSIS — K259 Gastric ulcer, unspecified as acute or chronic, without hemorrhage or perforation: Secondary | ICD-10-CM | POA: Diagnosis not present

## 2020-07-11 DIAGNOSIS — I10 Essential (primary) hypertension: Secondary | ICD-10-CM | POA: Insufficient documentation

## 2020-07-11 DIAGNOSIS — R1310 Dysphagia, unspecified: Secondary | ICD-10-CM | POA: Diagnosis not present

## 2020-07-11 DIAGNOSIS — Z1211 Encounter for screening for malignant neoplasm of colon: Secondary | ICD-10-CM | POA: Diagnosis not present

## 2020-07-11 DIAGNOSIS — J449 Chronic obstructive pulmonary disease, unspecified: Secondary | ICD-10-CM | POA: Diagnosis not present

## 2020-07-11 DIAGNOSIS — K2990 Gastroduodenitis, unspecified, without bleeding: Secondary | ICD-10-CM | POA: Diagnosis not present

## 2020-07-11 DIAGNOSIS — K3189 Other diseases of stomach and duodenum: Secondary | ICD-10-CM | POA: Diagnosis not present

## 2020-07-11 DIAGNOSIS — Z86010 Personal history of colonic polyps: Secondary | ICD-10-CM | POA: Insufficient documentation

## 2020-07-11 DIAGNOSIS — Z853 Personal history of malignant neoplasm of breast: Secondary | ICD-10-CM | POA: Diagnosis not present

## 2020-07-11 DIAGNOSIS — K2100 Gastro-esophageal reflux disease with esophagitis, without bleeding: Secondary | ICD-10-CM

## 2020-07-11 DIAGNOSIS — Z87891 Personal history of nicotine dependence: Secondary | ICD-10-CM | POA: Diagnosis not present

## 2020-07-11 DIAGNOSIS — K2970 Gastritis, unspecified, without bleeding: Secondary | ICD-10-CM

## 2020-07-11 DIAGNOSIS — K5909 Other constipation: Secondary | ICD-10-CM

## 2020-07-11 HISTORY — PX: COLONOSCOPY WITH PROPOFOL: SHX5780

## 2020-07-11 HISTORY — PX: SAVORY DILATION: SHX5439

## 2020-07-11 HISTORY — DX: Presence of dental prosthetic device (complete) (partial): Z97.2

## 2020-07-11 HISTORY — DX: Prediabetes: R73.03

## 2020-07-11 HISTORY — PX: ESOPHAGOGASTRODUODENOSCOPY (EGD) WITH PROPOFOL: SHX5813

## 2020-07-11 HISTORY — PX: BIOPSY: SHX5522

## 2020-07-11 LAB — GLUCOSE, CAPILLARY: Glucose-Capillary: 85 mg/dL (ref 70–99)

## 2020-07-11 SURGERY — COLONOSCOPY WITH PROPOFOL
Anesthesia: Monitor Anesthesia Care

## 2020-07-11 MED ORDER — PROPOFOL 10 MG/ML IV BOLUS
INTRAVENOUS | Status: DC | PRN
  Administered 2020-07-11 (×2): 10 mg via INTRAVENOUS
  Administered 2020-07-11: 20 mg via INTRAVENOUS
  Administered 2020-07-11 (×2): 10 mg via INTRAVENOUS
  Administered 2020-07-11 (×2): 20 mg via INTRAVENOUS

## 2020-07-11 MED ORDER — SODIUM CHLORIDE 0.9 % IV SOLN: INTRAVENOUS | Status: DC

## 2020-07-11 MED ORDER — PROPOFOL 500 MG/50ML IV EMUL
INTRAVENOUS | Status: DC | PRN
  Administered 2020-07-11: 120 ug/kg/min via INTRAVENOUS

## 2020-07-11 MED ORDER — LACTATED RINGERS IV SOLN: INTRAVENOUS | Status: DC | PRN

## 2020-07-11 MED ORDER — LIDOCAINE HCL (CARDIAC) PF 100 MG/5ML IV SOSY
PREFILLED_SYRINGE | INTRAVENOUS | Status: DC | PRN
  Administered 2020-07-11: 60 mg via INTRAVENOUS

## 2020-07-11 MED ORDER — PANTOPRAZOLE SODIUM 40 MG PO TBEC: 40.0000 mg | DELAYED_RELEASE_TABLET | Freq: Two times a day (BID) | ORAL | 3 refills | Status: DC

## 2020-07-11 SURGICAL SUPPLY — 24 items

## 2020-07-11 NOTE — Discharge Instructions (Signed)
YOU HAD AN ENDOSCOPIC PROCEDURE TODAY: Refer to the procedure report and other information in the discharge instructions given to you for any specific questions about what was found during the examination. If this information does not answer your questions, please call [place] office at [phone] to clarify.  ° °YOU SHOULD EXPECT: Some feelings of bloating in the abdomen. Passage of more gas than usual. Walking can help get rid of the air that was put into your GI tract during the procedure and reduce the bloating. If you had a lower endoscopy (such as a colonoscopy or flexible sigmoidoscopy) you may notice spotting of blood in your stool or on the toilet paper. Some abdominal soreness may be present for a day or two, also. ° °DIET: Your first meal following the procedure should be a light meal and then it is ok to progress to your normal diet. A half-sandwich or bowl of soup is an example of a good first meal. Heavy or fried foods are harder to digest and may make you feel nauseous or bloated. Drink plenty of fluids but you should avoid alcoholic beverages for 24 hours. If you had a esophageal dilation, please see attached instructions for diet.   ° °ACTIVITY: Your care partner should take you home directly after the procedure. You should plan to take it easy, moving slowly for the rest of the day. You can resume normal activity the day after the procedure however YOU SHOULD NOT DRIVE, use power tools, machinery or perform tasks that involve climbing or major physical exertion for 24 hours (because of the sedation medicines used during the test).  ° °SYMPTOMS TO REPORT IMMEDIATELY: °A gastroenterologist can be reached at any hour. Please call [phone]  for any of the following symptoms:  °Following lower endoscopy (colonoscopy, flexible sigmoidoscopy) °Excessive amounts of blood in the stool  °Significant tenderness, worsening of abdominal pains  °Swelling of the abdomen that is new, acute  °Fever of 100° or  higher  °Following upper endoscopy (EGD, EUS, ERCP, esophageal dilation) °Vomiting of blood or coffee ground material  °New, significant abdominal pain  °New, significant chest pain or pain under the shoulder blades  °Painful or persistently difficult swallowing  °New shortness of breath  °Black, tarry-looking or red, bloody stools ° °FOLLOW UP:  °If any biopsies were taken you will be contacted by phone or by letter within the next 1-3 weeks. Call [phone]  if you have not heard about the biopsies in 3 weeks.  °Please also call with any specific questions about appointments or follow up tests. ° °

## 2020-07-11 NOTE — Anesthesia Preprocedure Evaluation (Signed)
Anesthesia Evaluation  Patient identified by MRN, date of birth, ID band Patient awake    Reviewed: Allergy & Precautions, NPO status , Patient's Chart, lab work & pertinent test results  History of Anesthesia Complications Negative for: history of anesthetic complications  Airway Mallampati: II  TM Distance: >3 FB Neck ROM: Full    Dental  (+) Edentulous Upper, Edentulous Lower   Pulmonary COPD (nighttime O2),  oxygen dependent, former smoker,    Pulmonary exam normal        Cardiovascular hypertension, Pt. on medications Normal cardiovascular exam     Neuro/Psych negative neurological ROS  negative psych ROS   GI/Hepatic negative GI ROS, Neg liver ROS,   Endo/Other  negative endocrine ROS  Renal/GU negative Renal ROS  negative genitourinary   Musculoskeletal  (+) Arthritis ,   Abdominal Normal abdominal exam  (+)   Peds  Hematology negative hematology ROS (+)   Anesthesia Other Findings  H/o breast cancer  Reproductive/Obstetrics                             Anesthesia Physical  Anesthesia Plan  ASA: 2  Anesthesia Plan: MAC   Post-op Pain Management:    Induction:   PONV Risk Score and Plan: 2 and Treatment may vary due to age or medical condition, Propofol infusion and TIVA  Airway Management Planned: Oral ETT, Natural Airway, Nasal Cannula and Simple Face Mask  Additional Equipment: None  Intra-op Plan:   Post-operative Plan: Extubation in OR  Informed Consent: I have reviewed the patients History and Physical, chart, labs and discussed the procedure including the risks, benefits and alternatives for the proposed anesthesia with the patient or authorized representative who has indicated his/her understanding and acceptance.     Dental advisory given  Plan Discussed with: CRNA  Anesthesia Plan Comments:         Anesthesia Quick Evaluation

## 2020-07-11 NOTE — H&P (Signed)
Chief Complaint:    Colon Cancer Screening, dysphagia   HPI:     Patient is a 60 y.o. female presenting to [facility] unit for EGD and colonoscopy.  She was initially seen by me on 02/03/2020 as documented below:  "Hx of Stage I left Breast CA dx-ed 2016 s/p lumpectomy with adjuvant RT and Arimidex currently, COPD (requires nocturnal home O2), HTN, HLD, IFG, referred to the [facility]astro[facility]ology Clinic for evaluation of CRC screening.    Only prior colonoscopy was ~2017 in [place], [place] at [facility] and n/f polyps and inadequate prep per patient, with recommendation for 1 year repeat, but she subsequently moved to [facility].    Does have a history of constipation, well controlled with laxative prn and when she increases water intake. Has recenlty incrased her water intake with improvement. Otherwise, no hematochezia, melena.   Separately, has had pill dysphagia over the last few months, pointing to anterior neck. No issue with liquids. No HB, regurgitation. No prior EGD.    No known family history of CRC, GI malignancy, liver disease, pancreatic disease, or IBD."  She has since had shoulder surgery.  Otherwise, no significant changes in medical/surgical or medication history since that appointment.    Review of systems:     No chest pain, no SOB, no fevers, no urinary sx   Past Medical History:  Diagnosis Date   Anemia    as a child   Arthritis    Both Shoulders   Breast cancer ([place]) 1740   Left    Complication of anesthesia    states she had a hard time staying awake after hand surgery   COPD (chronic obstructive pulmonary disease) ([place])    uses O2 at 2l at night   High blood pressure    High cholesterol    History of kidney stones    Personal history of radiation therapy 2016   Left Breast Cancer   Pneumonia    as a child   Pre-diabetes    Prediabetes    Wears dentures    upper dentures    Patient's surgical history, family medical history, social  history, medications and allergies were all reviewed in Epic    No current facility-administered medications for this encounter.    Physical Exam:     BP 120/81   Pulse 85   Resp 18   Ht 4\' 11"  (1.499 m)   Wt 54 kg   SpO2 96%   BMI 24.04 kg/m   GENERAL:  Pleasant female in NAD PSYCH: : Cooperative, normal affect EENT:  conjunctiva pink, mucous membranes moist, neck supple without masses CARDIAC:  RRR, no murmur heard, no peripheral edema PULM: Normal respiratory effort, lungs CTA bilaterally, no wheezing ABDOMEN:  Nondistended, soft, nontender. No obvious masses, no hepatomegaly,  normal bowel sounds SKIN:  turgor, no lesions seen Musculoskeletal:  Normal muscle tone, normal strength NEURO: Alert and oriented x 3, no focal neurologic deficits   IMPRESSION and PLAN:    1) Colon cancer screening 2) Reported history of colon polyps - Colonoscopy today  3) Dysphagia - EGD with esophageal dilation and biopsies as appropriate  4) Chronic constipation - Well-controlled with adequate water intake and intermittent use of laxatives on demand - Evaluate for mucosal/luminal pathology at time of colonoscopy as above  5) COPD with home O2 - Procedures scheduled at [facility] due to elevated periprocedural risks          [place] ,DO,  FACG 07/11/2020, 7:23 AM

## 2020-07-11 NOTE — Anesthesia Postprocedure Evaluation (Signed)
Anesthesia Post Note  Patient: Sherri Gibson  Procedure(s) Performed: COLONOSCOPY WITH PROPOFOL ESOPHAGOGASTRODUODENOSCOPY (EGD) WITH PROPOFOL SAVORY DILATION BIOPSY     Patient location during evaluation: Endoscopy Anesthesia Type: MAC Level of consciousness: awake Pain management: pain level controlled Vital Signs Assessment: post-procedure vital signs reviewed and stable Respiratory status: spontaneous breathing Cardiovascular status: stable Postop Assessment: no apparent nausea or vomiting Anesthetic complications: no   No notable events documented.  Last Vitals:  Vitals:   07/11/20 0940 07/11/20 0952  BP: (!) 110/49 106/78  Pulse: 86 82  Resp: 14 17  Temp:    SpO2: 100% 95%    Last Pain:  Vitals:   07/11/20 0952  TempSrc:   PainSc: 0-No pain   Pain Goal:                   Huston Foley

## 2020-07-11 NOTE — Interval H&P Note (Signed)
History and Physical Interval Note:  07/11/2020 7:27 AM  Sherri Gibson  has presented today for surgery, with the diagnosis of Colon cancer screening, Dysphagia, Chronic constipation.  The various methods of treatment have been discussed with the patient and family. After consideration of risks, benefits and other options for treatment, the patient has consented to  Procedure(s): COLONOSCOPY WITH PROPOFOL (N/A) ESOPHAGOGASTRODUODENOSCOPY (EGD) WITH PROPOFOL (N/A) as a surgical intervention.  The patient's history has been reviewed, patient examined, no change in status, stable for surgery.  I have reviewed the patient's chart and labs.  Questions were answered to the patient's satisfaction.     Dominic Pea [name]

## 2020-07-11 NOTE — Op Note (Signed)
[facility] Patient Name: Sherri Gibson Procedure Date: 07/11/2020 MRN: [id number] Attending MD: Gerrit Heck , MD Date of Birth: 1961-01-12 CSN: [id number] Age: 60 Admit Type: Outpatient Procedure:                Colonoscopy Indications:              Screening for colorectal malignant neoplasm                           Colonoscopy in 2017 in [place] reportedly notable for                            polyps along with poor bowel preparation, with                            recommendation to repeat in 1 year. Providers:                Gerrit Heck, MD, Nelia Shi, RN, Tyna Jaksch Technician Referring MD:              Medicines:                Monitored Anesthesia Care Complications:            No immediate complications. Estimated Blood Loss:     Estimated blood loss: none. Procedure:                Pre-Anesthesia Assessment:                           - Prior to the procedure, a History and Physical                            was performed, and patient medications and                            allergies were reviewed. The patient's tolerance of                            previous anesthesia was also reviewed. The risks                            and benefits of the procedure and the sedation                            options and risks were discussed with the patient.                            All questions were answered, and informed consent                            was obtained. Prior Anticoagulants: The patient has                            taken no  previous anticoagulant or antiplatelet                            agents except for aspirin. ASA Grade Assessment:                            III - A patient with severe systemic disease. After                            reviewing the risks and benefits, the patient was                            deemed in satisfactory condition to undergo the                             procedure.                           After obtaining informed consent, the colonoscope                            was passed under direct vision. Throughout the                            procedure, the patient's blood pressure, pulse, and                            oxygen saturations were monitored continuously. The                            CF-HQ190L ([id number]) Olympus colonoscope was                            introduced through the anus and advanced to the the                            terminal ileum. The colonoscopy was performed                            without difficulty. The patient tolerated the                            procedure well. The quality of the bowel                            preparation was fair. The terminal ileum, ileocecal                            valve, appendiceal orifice, and rectum were                            photographed. Scope In: 9:09:12 AM Scope Out: 9:22:33 AM Scope Withdrawal Time: 0 hours 8 minutes 4 seconds  Total Procedure Duration: 0 hours 13 minutes 21 seconds  Findings:  The perianal and digital rectal examinations were normal.      A moderate amount of stool and solid food debris was found scattered       throughout the colon, interfering with visualization. Lavage of the area       was performed using copious amounts of tap water, resulting in clearance       with fair visualization. The small particulate matter clogged the       colonoscope several times, which impeded clearance in these areas.       Cannot rule out the presence of smaller and flat polyps in these areas.      The visualized mucosa was otherwise normal appearing throughout the       colon.      The retroflexed view of the distal rectum and anal verge was normal and       showed no anal or rectal abnormalities.      The terminal ileum appeared normal. Impression:               - Preparation of the colon was fair.                           - Stool and solid food  debris in the entire                            examined colon.                           - The visualized mucosa was otherwise normal                            appearing.                           - The distal rectum and anal verge are normal on                            retroflexion view.                           - The examined portion of the ileum was normal.                           - No specimens collected. Moderate Sedation:      Not Applicable - Patient had care per Anesthesia. Recommendation:           - Patient has a contact number available for                            emergencies. The signs and symptoms of potential                            delayed complications were discussed with the                            patient. Return to normal activities tomorrow.  Written discharge instructions were provided to the                            patient.                           - Repeat colonoscopy in 1 year because the bowel                            preparation was suboptimal.                           - Plan for extended 2-day prep with next procedure. Procedure Code(s):        --- Professional ---                           H4765, Colorectal cancer screening; colonoscopy on                            individual not meeting criteria for high risk Diagnosis Code(s):        --- Professional ---                           Z12.11, Encounter for screening for malignant                            neoplasm of colon CPT copyright 2019 American Medical Association. All rights reserved. The codes documented in this report are preliminary and upon coder review may  be revised to meet current compliance requirements. Gerrit Heck, MD 07/11/2020 9:45:13 AM Number of Addenda: 0

## 2020-07-11 NOTE — Op Note (Signed)
[facility] Patient Name: Sherri Gibson Procedure Date: 07/11/2020 MRN: [id number] Attending MD: Gerrit Heck , MD Date of Birth: 03/11/1960 CSN: [id number] Age: 60 Admit Type: Outpatient Procedure:                Upper GI endoscopy Indications:              Dysphagia Providers:                Gerrit Heck, MD, Nelia Shi, RN, Tyna Jaksch Technician Referring MD:              Medicines:                Monitored Anesthesia Care Complications:            No immediate complications. Estimated Blood Loss:     Estimated blood loss was minimal. Procedure:                Pre-Anesthesia Assessment:                           - Prior to the procedure, a History and Physical                            was performed, and patient medications and                            allergies were reviewed. The patient's tolerance of                            previous anesthesia was also reviewed. The risks                            and benefits of the procedure and the sedation                            options and risks were discussed with the patient.                            All questions were answered, and informed consent                            was obtained. Prior Anticoagulants: The patient has                            taken no previous anticoagulant or antiplatelet                            agents except for aspirin. ASA Grade Assessment:                            III - A patient with severe systemic disease. After                            reviewing  the risks and benefits, the patient was                            deemed in satisfactory condition to undergo the                            procedure.                           After obtaining informed consent, the endoscope was                            passed under direct vision. Throughout the                            procedure, the patient's blood pressure, pulse, and                             oxygen saturations were monitored continuously. The                            [id number]-[id number] ([id number]) Olympus gastroscope was                            introduced through the mouth, and advanced to the                            second part of duodenum. The upper GI endoscopy was                            accomplished without difficulty. The patient                            tolerated the procedure well. Scope In: Scope Out: Findings:      LA Grade A (one or more mucosal breaks less than 5 mm, not extending       between tops of 2 mucosal folds) esophagitis with no bleeding was found       at the gastroesophageal junction.      The upper third of the esophagus, middle third of the esophagus and       lower third of the esophagus were normal. A guidewire was placed and the       scope was withdrawn. Dilation was performed with a Savary dilator with       mild resistance at 18 mm. The dilation site was examined following       endoscope reinsertion and showed mild mucosal disruption in the upper       esophagus, consistent with successful dilation. Estimated blood loss was       minimal.      Scattered mild inflammation characterized by congestion (edema) and       erythema was found in the gastric body and in the gastric antrum.       Biopsies were taken with a cold forceps for Helicobacter pylori testing.       Estimated blood loss was minimal.      A single 6 mm mucosal nodule with  no bleeding was found in the gastric       fundus. The overlying mucosa was normal appearing. Bite-on-bite       tunnelled biopsies were taken with a cold forceps for histology.       Estimated blood loss was minimal.      The examined duodenum was normal. Impression:               - LA Grade A reflux esophagitis with no bleeding.                           - Normal upper third of esophagus, middle third of                            esophagus and lower third of esophagus.  Dilated.                           - Gastritis. Biopsied.                           - A single mucosal nodule found in the stomach.                            Biopsied.                           - Normal examined duodenum. Moderate Sedation:      Not Applicable - Patient had care per Anesthesia. Recommendation:           - Patient has a contact number available for                            emergencies. The signs and symptoms of potential                            delayed complications were discussed with the                            patient. Return to normal activities tomorrow.                            Written discharge instructions were provided to the                            patient.                           - Soft diet today, then slowly advance to previous                            diet tomorrow.                           - Continue present medications.                           - Await pathology results.                           -  Use Protonix (pantoprazole) 40 mg PO BID for 6                            weeks to promote mucosal healing, then reduce to 40                            mg daily and will continue to titrate to lowest                            effective dose, or off completely if resolution of                            symptoms.                           - Return to GI clinic in 3 months.                           - Depending on biopsy results from the gastric                            nodule, will discuss whether or not Endoscopic                            Ultrasound is indicated for further evaluation.                           - Colonoscopy today. Procedure Code(s):        --- Professional ---                           [phone], Esophagogastroduodenoscopy, flexible,                            transoral; with insertion of guide wire followed by                            passage of dilator(s) through esophagus over guide                            wire                            51884, 48, Esophagogastroduodenoscopy, flexible,                            transoral; with biopsy, single or multiple Diagnosis Code(s):        --- Professional ---                           K21.00, Gastro-esophageal reflux disease with                            esophagitis, without bleeding  K29.70, Gastritis, unspecified, without bleeding                           K31.89, Other diseases of stomach and duodenum                           R13.10, Dysphagia, unspecified CPT copyright 2019 American Medical Association. All rights reserved. The codes documented in this report are preliminary and upon coder review may  be revised to meet current compliance requirements. Gerrit Heck, MD 07/11/2020 9:39:38 AM Number of Addenda: 0

## 2020-07-11 NOTE — Transfer of Care (Signed)
Immediate Anesthesia Transfer of Care Note  Patient: Sherri Gibson  Procedure(s) Performed: COLONOSCOPY WITH PROPOFOL ESOPHAGOGASTRODUODENOSCOPY (EGD) WITH PROPOFOL SAVORY DILATION BIOPSY  Patient Location: Endoscopy Unit  Anesthesia Type:MAC  Level of Consciousness: awake, alert , oriented, drowsy and patient cooperative  Airway & Oxygen Therapy: Patient Spontanous Breathing and Patient connected to face mask oxygen  Post-op Assessment: Report given to RN and Post -op Vital signs reviewed and stable  Post vital signs: Reviewed and stable  Last Vitals:  Vitals Value Taken Time  BP 92/50 07/11/20 0926  Temp    Pulse 81 07/11/20 0927  Resp 23 07/11/20 0927  SpO2 100 % 07/11/20 0927  Vitals shown include unvalidated device data.  Last Pain:  Vitals:   07/11/20 0721  TempSrc: Oral  PainSc: 0-No pain         Complications: No notable events documented.

## 2020-07-12 LAB — SURGICAL PATHOLOGY

## 2020-07-13 ENCOUNTER — Other Ambulatory Visit: Payer: Self-pay | Admitting: Osteopathic Medicine

## 2020-07-13 ENCOUNTER — Other Ambulatory Visit: Payer: Self-pay | Admitting: Orthopedic Surgery

## 2020-07-13 ENCOUNTER — Encounter (HOSPITAL_COMMUNITY): Payer: Self-pay | Admitting: Gastroenterology

## 2020-07-13 DIAGNOSIS — M19011 Primary osteoarthritis, right shoulder: Secondary | ICD-10-CM

## 2020-07-13 DIAGNOSIS — M19012 Primary osteoarthritis, left shoulder: Secondary | ICD-10-CM

## 2020-07-15 ENCOUNTER — Other Ambulatory Visit: Payer: Self-pay

## 2020-07-15 ENCOUNTER — Ambulatory Visit (INDEPENDENT_AMBULATORY_CARE_PROVIDER_SITE_OTHER): Payer: BC Managed Care – PPO

## 2020-07-15 DIAGNOSIS — M25511 Pain in right shoulder: Secondary | ICD-10-CM | POA: Diagnosis not present

## 2020-07-15 DIAGNOSIS — M19011 Primary osteoarthritis, right shoulder: Secondary | ICD-10-CM | POA: Diagnosis not present

## 2020-07-15 DIAGNOSIS — M24011 Loose body in right shoulder: Secondary | ICD-10-CM | POA: Diagnosis not present

## 2020-07-15 DIAGNOSIS — Z01818 Encounter for other preprocedural examination: Secondary | ICD-10-CM | POA: Diagnosis not present

## 2020-07-15 DIAGNOSIS — S43021A Posterior subluxation of right humerus, initial encounter: Secondary | ICD-10-CM | POA: Diagnosis not present

## 2020-07-18 ENCOUNTER — Telehealth: Payer: Self-pay | Admitting: General Surgery

## 2020-07-18 NOTE — Telephone Encounter (Signed)
LVM for the patient to contact the office regarding results and further treatment

## 2020-07-18 NOTE — Telephone Encounter (Signed)
Patient calling back.   °

## 2020-07-18 NOTE — Telephone Encounter (Signed)
-----   Message from [place], DO sent at 07/13/2020  8:38 AM EDT ----- The biopsies from the recent upper endoscopy are notable for the following: - The biopsies taken from your stomach were notable for gastritis (inflammation), but there was no evidence of Helicobacter pylori infection.  -The nodule in the stomach that was biopsied demonstrates benign gastric mucosa with slight inflammation.  I suspect this is not an adequate sample of what appears to be a subepithelial lesion.  I recommend further evaluation with Endoscopic Ultrasound with Dr. Rush Landmark or Dr. Ardis Hughs.

## 2020-07-18 NOTE — Telephone Encounter (Signed)
Spoke with patient and gave her all the information, she states she will be gone the first week of July, and will have sx at the end of the July 2022 on her shoulder.

## 2020-07-18 NOTE — Telephone Encounter (Signed)
Left a detailed message for the patient regarding her bx and that we would like to refer her to Dr Ardis Hughs or [place] for an EUS.

## 2020-07-18 NOTE — Telephone Encounter (Signed)
See note below from Dr. Tracie Harrier regarding EUS and advise.

## 2020-07-18 NOTE — Telephone Encounter (Signed)
She needs first available upper EUS appt with myself or Dr. Rush Landmark for gastric nodule.  Thanks

## 2020-07-25 ENCOUNTER — Other Ambulatory Visit: Payer: Self-pay

## 2020-07-25 DIAGNOSIS — K3189 Other diseases of stomach and duodenum: Secondary | ICD-10-CM

## 2020-07-25 NOTE — Telephone Encounter (Signed)
EUS scheduled, pt instructed and medications reviewed.  Patient instructions mailed to home.  Patient to call with any questions or concerns.  

## 2020-07-25 NOTE — Progress Notes (Signed)
Does she want to get scheduled for surgery?

## 2020-07-25 NOTE — Telephone Encounter (Signed)
EUS scheduled for 08/11/20 at  [facility] with Dr Ardis Hughs at 11 am

## 2020-07-26 ENCOUNTER — Encounter: Payer: Self-pay | Admitting: Orthopedic Surgery

## 2020-07-26 DIAGNOSIS — J449 Chronic obstructive pulmonary disease, unspecified: Secondary | ICD-10-CM | POA: Diagnosis not present

## 2020-07-27 NOTE — Progress Notes (Signed)
Done thx

## 2020-08-02 ENCOUNTER — Other Ambulatory Visit: Payer: Self-pay

## 2020-08-02 MED ORDER — PANTOPRAZOLE SODIUM 40 MG PO TBEC: 40.0000 mg | DELAYED_RELEASE_TABLET | Freq: Two times a day (BID) | ORAL | 3 refills | Status: DC

## 2020-08-04 ENCOUNTER — Other Ambulatory Visit: Payer: Self-pay

## 2020-08-04 ENCOUNTER — Encounter (HOSPITAL_COMMUNITY): Payer: Self-pay | Admitting: Gastroenterology

## 2020-08-09 ENCOUNTER — Encounter: Payer: Self-pay | Admitting: Orthopedic Surgery

## 2020-08-09 ENCOUNTER — Other Ambulatory Visit: Payer: Self-pay | Admitting: Osteopathic Medicine

## 2020-08-09 DIAGNOSIS — I10 Essential (primary) hypertension: Secondary | ICD-10-CM

## 2020-08-11 ENCOUNTER — Ambulatory Visit (HOSPITAL_COMMUNITY)
Admission: RE | Admit: 2020-08-11 | Discharge: 2020-08-11 | Disposition: A | Discharge status: Home / Self Care | Payer: BC Managed Care – PPO | Attending: Gastroenterology | Admitting: Gastroenterology

## 2020-08-11 ENCOUNTER — Encounter (HOSPITAL_COMMUNITY): Payer: Self-pay | Admitting: Gastroenterology

## 2020-08-11 ENCOUNTER — Ambulatory Visit (HOSPITAL_COMMUNITY): Payer: BC Managed Care – PPO | Admitting: Anesthesiology

## 2020-08-11 ENCOUNTER — Other Ambulatory Visit: Payer: Self-pay

## 2020-08-11 ENCOUNTER — Encounter (HOSPITAL_COMMUNITY)
Admission: RE | Disposition: A | Discharge status: Home / Self Care | Payer: Self-pay | Source: Home / Self Care | Attending: Gastroenterology

## 2020-08-11 DIAGNOSIS — J9610 Chronic respiratory failure, unspecified whether with hypoxia or hypercapnia: Secondary | ICD-10-CM | POA: Diagnosis not present

## 2020-08-11 DIAGNOSIS — K3189 Other diseases of stomach and duodenum: Secondary | ICD-10-CM | POA: Diagnosis not present

## 2020-08-11 DIAGNOSIS — Z87891 Personal history of nicotine dependence: Secondary | ICD-10-CM | POA: Insufficient documentation

## 2020-08-11 DIAGNOSIS — E782 Mixed hyperlipidemia: Secondary | ICD-10-CM | POA: Diagnosis not present

## 2020-08-11 DIAGNOSIS — Z885 Allergy status to narcotic agent status: Secondary | ICD-10-CM | POA: Insufficient documentation

## 2020-08-11 DIAGNOSIS — K2100 Gastro-esophageal reflux disease with esophagitis, without bleeding: Secondary | ICD-10-CM | POA: Diagnosis not present

## 2020-08-11 DIAGNOSIS — K2950 Unspecified chronic gastritis without bleeding: Secondary | ICD-10-CM | POA: Diagnosis not present

## 2020-08-11 HISTORY — PX: ESOPHAGOGASTRODUODENOSCOPY (EGD) WITH PROPOFOL: SHX5813

## 2020-08-11 HISTORY — PX: EUS: SHX5427

## 2020-08-11 HISTORY — PX: BIOPSY: SHX5522

## 2020-08-11 LAB — GLUCOSE, CAPILLARY: Glucose-Capillary: 92 mg/dL (ref 70–99)

## 2020-08-11 SURGERY — UPPER ENDOSCOPIC ULTRASOUND (EUS) RADIAL
Anesthesia: Monitor Anesthesia Care

## 2020-08-11 MED ORDER — PROPOFOL 500 MG/50ML IV EMUL
INTRAVENOUS | Status: AC
  Filled 2020-08-11: qty 50

## 2020-08-11 MED ORDER — PROPOFOL 10 MG/ML IV BOLUS
INTRAVENOUS | Status: DC | PRN
  Administered 2020-08-11 (×2): 20 mg via INTRAVENOUS
  Administered 2020-08-11 (×2): 10 mg via INTRAVENOUS
  Administered 2020-08-11: 20 mg via INTRAVENOUS

## 2020-08-11 MED ORDER — LACTATED RINGERS IV SOLN: INTRAVENOUS | Status: DC

## 2020-08-11 MED ORDER — PROPOFOL 500 MG/50ML IV EMUL
INTRAVENOUS | Status: DC | PRN
  Administered 2020-08-11: 135 ug/kg/min via INTRAVENOUS

## 2020-08-11 MED ORDER — PHENYLEPHRINE 40 MCG/ML (10ML) SYRINGE FOR IV PUSH (FOR BLOOD PRESSURE SUPPORT)
PREFILLED_SYRINGE | INTRAVENOUS | Status: DC | PRN
  Administered 2020-08-11: 120 ug via INTRAVENOUS

## 2020-08-11 MED ORDER — LIDOCAINE 2% (20 MG/ML) 5 ML SYRINGE
INTRAMUSCULAR | Status: DC | PRN
  Administered 2020-08-11: 40 mg via INTRAVENOUS

## 2020-08-11 NOTE — Anesthesia Postprocedure Evaluation (Signed)
Anesthesia Post Note  Patient: Sherri Gibson  Procedure(s) Performed: UPPER ENDOSCOPIC ULTRASOUND (EUS) RADIAL BIOPSY     Patient location during evaluation: PACU Anesthesia Type: MAC Level of consciousness: awake and alert Pain management: pain level controlled Vital Signs Assessment: post-procedure vital signs reviewed and stable Respiratory status: spontaneous breathing, nonlabored ventilation and respiratory function stable Cardiovascular status: blood pressure returned to baseline and stable Postop Assessment: no apparent nausea or vomiting Anesthetic complications: no   No notable events documented.  Last Vitals:  Vitals:   08/11/20 0936 08/11/20 1032  BP: 125/70 (!) 71/38  Pulse: 82   Resp: 14   Temp: 37.1 C (!) 35.7 C  SpO2: 96%     Last Pain:  Vitals:   08/11/20 1100  TempSrc:   PainSc: 0-No pain                 Pervis Hocking

## 2020-08-11 NOTE — Anesthesia Preprocedure Evaluation (Addendum)
Anesthesia Evaluation  Patient identified by MRN, date of birth, ID band Patient awake    Reviewed: Allergy & Precautions, NPO status , Patient's Chart, lab work & pertinent test results  History of Anesthesia Complications Negative for: history of anesthetic complications  Airway Mallampati: II   Neck ROM: Full  Mouth opening: Limited Mouth Opening  Dental  (+) Edentulous Upper, Edentulous Lower   Pulmonary COPD (2LPM [place] at night ),  COPD inhaler and oxygen dependent, former smoker,  Quit smoking 2018, 110 pack year history    breath sounds clear to auscultation       Cardiovascular hypertension, Pt. on medications  Rhythm:Regular Rate:Normal     Neuro/Psych negative neurological ROS  negative psych ROS   GI/Hepatic negative GI ROS, Neg liver ROS,   Endo/Other  diabetes, Well Controlled, Type 2, Oral Hypoglycemic Agents  Renal/GU negative Renal ROS  negative genitourinary   Musculoskeletal  (+) Arthritis , Osteoarthritis,    Abdominal   Peds  Hematology negative hematology ROS (+)   Anesthesia Other Findings Hx L breast ca  Reproductive/Obstetrics negative OB ROS                            Anesthesia Physical Anesthesia Plan  ASA: 3  Anesthesia Plan: MAC   Post-op Pain Management:    Induction:   PONV Risk Score and Plan: 2 and Propofol infusion and TIVA  Airway Management Planned: Natural Airway and Simple Face Mask  Additional Equipment: None  Intra-op Plan:   Post-operative Plan:   Informed Consent: I have reviewed the patients History and Physical, chart, labs and discussed the procedure including the risks, benefits and alternatives for the proposed anesthesia with the patient or authorized representative who has indicated his/her understanding and acceptance.     Dental advisory given  Plan Discussed with: CRNA  Anesthesia Plan Comments:        Anesthesia  Quick Evaluation

## 2020-08-11 NOTE — Anesthesia Procedure Notes (Signed)
Procedure Name: MAC Date/Time: 08/11/2020 9:59 AM Performed by: Niel Hummer, CRNA Pre-anesthesia Checklist: Patient identified, Emergency Drugs available, Suction available and Patient being monitored Oxygen Delivery Method: Simple face mask

## 2020-08-11 NOTE — Op Note (Signed)
[facility] Patient Name: Sherri Gibson Procedure Date: 08/11/2020 MRN: [id number] Attending MD: Milus Banister , MD Date of Birth: 1960/11/11 CSN: [id number] Age: 60 Admit Type: Outpatient Procedure:                Upper EUS Indications:              Incidental gastric fundus subepithelial lesion                            noted on recent EGD, tunnel biospies inconclusive                            but no sign of neoplasm Providers:                Milus Banister, MD, Glori Bickers, RN, Hinton Dyer,                            Ladona Ridgel, Technician, Maudry Diego, CRNA Referring MD:             Gerrit Heck, DO Medicines:                Monitored Anesthesia Care Complications:            No immediate complications. Estimated blood loss:                            None. Estimated Blood Loss:     Estimated blood loss: none. Procedure:                Pre-Anesthesia Assessment:                           - Prior to the procedure, a History and Physical                            was performed, and patient medications and                            allergies were reviewed. The patient's tolerance of                            previous anesthesia was also reviewed. The risks                            and benefits of the procedure and the sedation                            options and risks were discussed with the patient.                            All questions were answered, and informed consent                            was obtained. Prior Anticoagulants: The patient has  taken no previous anticoagulant or antiplatelet                            agents. ASA Grade Assessment: II - A patient with                            mild systemic disease. After reviewing the risks                            and benefits, the patient was deemed in                            satisfactory condition to undergo the procedure.                            After obtaining informed consent, the endoscope was                            passed under direct vision. Throughout the                            procedure, the patient's blood pressure, pulse, and                            oxygen saturations were monitored continuously. The                            [id number] ([id number]) Olympus was introduced through                            the mouth, and advanced to the second part of                            duodenum. The upper EUS was accomplished without                            difficulty. The patient tolerated the procedure                            well. Scope In: Scope Out: Findings:      Endoscopic findings (with radial EUS scope and standard adult       gastroscope):      1. Small gastric subepithelial lesion in the proximal stomach with       evidence of recent mucosal biopsies. Overlying mucosa was normal.      2. Upper GI tract was otherwise normal on limited views.      ENDOSONOGRAPHIC FINDING: :      1. The subepithelial lesion described above correlated with an 8.59mm       heterogeneous lesion that appears to communicate with the muscularis       propria layer of the gastric fundus wall. There was shadowing       calcification within the lesion. I did not think that FNA would yield       much tissue given the morphologic features and so I proceeded  with       tunnel biopsies using adult gastroscope, standard forceps. Gastric wall       was otherwise normal and there was no perigastric adenopathy.      2. Limited views of the liver, pancreas, extrahepatic biliary tree were       essentially normal. Impression:               - Incidental 8.5 mm calcified heterogeneous                            subepithelial lesion that appears to communicate                            with the muscularis propria layer of the gastric                            fundus. sampled this lesion with tunnel biopsies                             using forceps. Await final pathology results. If                            the results are nondiagnostic I will likely                            recommend repeat evaluation in 1 or 2 years. Moderate Sedation:      Not Applicable - Patient had care per Anesthesia. Recommendation:           - Discharge patient to home.                           - Await path results. Procedure Code(s):        --- Professional ---                           [phone], Esophagogastroduodenoscopy, flexible,                            transoral; with endoscopic ultrasound examination                            limited to the esophagus, stomach or duodenum, and                            adjacent structures                           43239, Esophagogastroduodenoscopy, flexible,                            transoral; with biopsy, single or multiple Diagnosis Code(s):        --- Professional ---                           K31.89, Other diseases of stomach and duodenum CPT copyright 2019 American Medical Association. All rights reserved. The codes documented in  this report are preliminary and upon coder review may  be revised to meet current compliance requirements. Milus Banister, MD 08/11/2020 10:37:50 AM This report has been signed electronically. Number of Addenda: 0

## 2020-08-11 NOTE — H&P (Signed)
HPI: This is a woman with incidental subepith nodule of the stomach seen on recent EGD   ROS: complete GI ROS as described in HPI, all other review negative.  Constitutional:  No unintentional weight loss   Past Medical History:  Diagnosis Date   Anemia    as a child   Arthritis    Both Shoulders   Breast cancer ([place]) 6144   Left    Complication of anesthesia    states she had a hard time staying awake after hand surgery   COPD (chronic obstructive pulmonary disease) ([place])    uses O2 at 2l at night   High blood pressure    High cholesterol    History of kidney stones    Personal history of radiation therapy 2016   Left Breast Cancer   Pneumonia    as a child   Pre-diabetes    Prediabetes    Wears dentures    upper dentures    Past Surgical History:  Procedure Laterality Date   BIOPSY  07/11/2020   Procedure: BIOPSY;  Surgeon: Lavena Bullion, DO;  Location: [facility];  Service: Gastroenterology;;   BREAST BIOPSY     BREAST LUMPECTOMY Left 2021   BREAST LUMPECTOMY WITH AXILLARY LYMPH NODE BIOPSY  2016   COLONOSCOPY  2017   [facility]. think there was colon polyps   COLONOSCOPY WITH PROPOFOL N/A 07/11/2020   Procedure: COLONOSCOPY WITH PROPOFOL;  Surgeon: Lavena Bullion, DO;  Location: [facility];  Service: Gastroenterology;  Laterality: N/A;   ESOPHAGOGASTRODUODENOSCOPY (EGD) WITH PROPOFOL N/A 07/11/2020   Procedure: ESOPHAGOGASTRODUODENOSCOPY (EGD) WITH PROPOFOL;  Surgeon: Lavena Bullion, DO;  [facility];  Service: Gastroenterology;  Laterality: N/A;   HAND SURGERY Left    PARTIAL HYSTERECTOMY     SAVORY DILATION N/A 07/11/2020   Procedure: SAVORY DILATION;  Surgeon: Lavena Bullion, DO;  [facility];  Service: Gastroenterology;  Laterality: N/A;   TOTAL SHOULDER ARTHROPLASTY Left 04/12/2020   Procedure: LEFT REVERSE TOTAL SHOULDER ARTHROPLASTY;  Surgeon: Meredith Pel, MD;   Location: [place];  Service: Orthopedics;  Laterality: Left;    Current Facility-Administered Medications  Medication Dose Route Frequency Provider Last Rate Last Admin   lactated ringers infusion   Intravenous Continuous Milus Banister, MD        Allergies as of 07/25/2020 - Review Complete 07/11/2020  Allergen Reaction Noted   Codeine Itching 02/06/2016   Tetanus toxoids Swelling 08/21/2017    Family History  Problem Relation Age of Onset   Breast cancer Mother    Diabetes Brother    Colon cancer Neg Hx    Esophageal cancer Neg Hx     Social History   Socioeconomic History   Marital status: Married    Spouse name: Not on file   Number of children: Not on file   Years of education: Not on file   Highest education level: Not on file  Occupational History   Not on file  Tobacco Use   Smoking status: Former    Packs/day: 2.50    Years: 44.00    Pack years: 110.00    Types: Cigarettes    Quit date: 11/29/2016    Years since quitting: 3.7   Smokeless tobacco: Never  Vaping Use   Vaping Use: Never used  Substance and Sexual Activity   Alcohol use: Not Currently   Drug use: Never   Sexual activity: Yes    Partners:  Male    Birth control/protection: None  Other Topics Concern   Not on file  Social History Narrative   Not on file   Social Determinants of Health   Financial Resource Strain: Not on file  Food Insecurity: Not on file  Transportation Needs: Not on file  Physical Activity: Not on file  Stress: Not on file  Social Connections: Not on file  Intimate Partner Violence: Not on file     Physical Exam: BP 125/70   Pulse 82   Temp 98.7 F (37.1 C) (Oral)   Resp 14   Ht 4\' 11"  (1.499 m)   Wt 53.5 kg   SpO2 96%   BMI 23.83 kg/m  Constitutional: generally well-appearing Psychiatric: alert and oriented x3 Abdomen: soft, nontender, nondistended, no obvious ascites, no peritoneal signs, normal bowel sounds No peripheral edema noted in lower  extremities  Assessment and plan: 60 y.o. female with incidental subepith lesion of the stomach noted on recent EGD  For EUS evaluation today  Please see the "Patient Instructions" section for addition details about the plan.  Owens Loffler, MD [facility] 08/11/2020, 9:44 AM

## 2020-08-11 NOTE — Transfer of Care (Signed)
Immediate Anesthesia Transfer of Care Note  Patient: Sherri Gibson  Procedure(s) Performed: UPPER ENDOSCOPIC ULTRASOUND (EUS) RADIAL BIOPSY  Patient Location: PACU  Anesthesia Type:MAC  Level of Consciousness: awake  Airway & Oxygen Therapy: Patient Spontanous Breathing and Patient connected to face mask oxygen  Post-op Assessment: Report given to RN, Post -op Vital signs reviewed and stable and Patient moving all extremities X 4  Post vital signs: Reviewed and stable  Last Vitals:  Vitals Value Taken Time  BP 71/38- treated with neo   Temp    Pulse 78 08/11/20 1031  Resp    SpO2 100 % 08/11/20 1031  Vitals shown include unvalidated device data.  Last Pain:  Vitals:   08/11/20 0936  TempSrc: Oral  PainSc: 0-No pain         Complications: No notable events documented.

## 2020-08-12 ENCOUNTER — Encounter: Payer: Self-pay | Admitting: Family Medicine

## 2020-08-12 ENCOUNTER — Ambulatory Visit: Payer: BC Managed Care – PPO | Admitting: Family Medicine

## 2020-08-12 DIAGNOSIS — M778 Other enthesopathies, not elsewhere classified: Secondary | ICD-10-CM | POA: Insufficient documentation

## 2020-08-12 LAB — SURGICAL PATHOLOGY

## 2020-08-12 MED ORDER — PREDNISONE 10 MG (21) PO TBPK: ORAL_TABLET | ORAL | 0 refills | Status: DC

## 2020-08-12 NOTE — Progress Notes (Signed)
Sherri Gibson - 60 y.o. female MRN [id number]  Date of birth: Mar 28, 1960  Subjective No chief complaint on file.   HPI Sherri Gibson is 60 y.o. female here today with complaint of L wrist/thumb pain.  Symptoms started a few days ago.  She denies any known injury or overuse.  She is right handed.  She has had previous surgery to the R thumb for arthritis.  She has not tried anything for management of symptoms.  ROS:  A comprehensive ROS was completed and negative except as noted per HPI  Allergies  Allergen Reactions   Codeine Itching    Feels like something crawling on her skin    Tetanus Toxoids Swelling    Patient stated,"I was given a Pneumonia shot at the same time I got the Tetanus shot, in the same arm."    Past Medical History:  Diagnosis Date   Anemia    as a child   Arthritis    Both Shoulders   Breast cancer ([place]) 4098   Left    Complication of anesthesia    states she had a hard time staying awake after hand surgery   COPD (chronic obstructive pulmonary disease) ([place])    uses O2 at 2l at night   High blood pressure    High cholesterol    History of kidney stones    Personal history of radiation therapy 2016   Left Breast Cancer   Pneumonia    as a child   Pre-diabetes    Prediabetes    Wears dentures    upper dentures    Past Surgical History:  Procedure Laterality Date   BIOPSY  07/11/2020   Procedure: BIOPSY;  Surgeon: Lavena Bullion, DO;  [facility];  Service: Gastroenterology;;   BIOPSY  08/11/2020   Procedure: BIOPSY;  Surgeon: Milus Banister, MD;  [facility];  Service: Endoscopy;;   BREAST BIOPSY     BREAST LUMPECTOMY Left 2021   BREAST LUMPECTOMY WITH AXILLARY LYMPH NODE BIOPSY  2016   COLONOSCOPY  2017   [facility]. think there was colon polyps   COLONOSCOPY WITH PROPOFOL N/A 07/11/2020   Procedure: COLONOSCOPY WITH PROPOFOL;  Surgeon: Lavena Bullion, DO;   [facility];  Service: Gastroenterology;  Laterality: N/A;   ESOPHAGOGASTRODUODENOSCOPY (EGD) WITH PROPOFOL N/A 07/11/2020   Procedure: ESOPHAGOGASTRODUODENOSCOPY (EGD) WITH PROPOFOL;  Surgeon: Lavena Bullion, DO;  [facility];  Service: Gastroenterology;  Laterality: N/A;   ESOPHAGOGASTRODUODENOSCOPY (EGD) WITH PROPOFOL N/A 08/11/2020   Procedure: ESOPHAGOGASTRODUODENOSCOPY (EGD) WITH PROPOFOL;  Surgeon: Milus Banister, MD;  [facility];  Service: Endoscopy;  Laterality: N/A;   EUS N/A 08/11/2020   Procedure: UPPER ENDOSCOPIC ULTRASOUND (EUS) RADIAL;  Surgeon: Milus Banister, MD;  [facility];  Service: Endoscopy;  Laterality: N/A;   HAND SURGERY Left    PARTIAL HYSTERECTOMY     SAVORY DILATION N/A 07/11/2020   Procedure: SAVORY DILATION;  Surgeon: Lavena Bullion, DO;  [facility];  Service: Gastroenterology;  Laterality: N/A;   TOTAL SHOULDER ARTHROPLASTY Left 04/12/2020   Procedure: LEFT REVERSE TOTAL SHOULDER ARTHROPLASTY;  Surgeon: Meredith Pel, MD;  Location: [place];  Service: Orthopedics;  Laterality: Left;    Social History   Socioeconomic History   Marital status: Married    Spouse name: Not on file   Number of children: Not on file   Years of education: Not on file  Highest education level: Not on file  Occupational History   Not on file  Tobacco Use   Smoking status: Former    Packs/day: 2.50    Years: 44.00    Pack years: 110.00    Types: Cigarettes    Quit date: 11/29/2016    Years since quitting: 3.7   Smokeless tobacco: Never  Vaping Use   Vaping Use: Never used  Substance and Sexual Activity   Alcohol use: Not Currently   Drug use: Never   Sexual activity: Yes    Partners: Male    Birth control/protection: None  Other Topics Concern   Not on file  Social History Narrative   Not on file   Social Determinants of Health   Financial Resource Strain: Not on file  Food Insecurity: Not on  file  Transportation Needs: Not on file  Physical Activity: Not on file  Stress: Not on file  Social Connections: Not on file    Family History  Problem Relation Age of Onset   Breast cancer Mother    Diabetes Brother    Colon cancer Neg Hx    Esophageal cancer Neg Hx     Health Maintenance  Topic Date Due   FOOT EXAM  Never done   OPHTHALMOLOGY EXAM  Never done   Hepatitis C Screening  Never done   PAP SMEAR-Modifier  Never done   Pneumococcal Vaccine 24-60 Years old (2 - PCV) 05/30/2018   COVID-19 Vaccine (4 - Booster for Pfizer series) 03/06/2020   Zoster Vaccines- Shingrix (2 of 2) 06/03/2020   INFLUENZA VACCINE  08/29/2020   HEMOGLOBIN A1C  12/02/2020   MAMMOGRAM  04/07/2022   TETANUS/TDAP  05/30/2027   COLONOSCOPY (Pts 45-3yrs Insurance coverage will need to be confirmed)  07/12/2030   PNEUMOCOCCAL POLYSACCHARIDE VACCINE AGE 34-64 HIGH RISK  Completed   HIV Screening  Completed   HPV VACCINES  Aged Out     ----------------------------------------------------------------------------------------------------------------------------------------------------------------------------------------------------------------- Physical Exam BP 113/70   Pulse 94   Wt 121 lb (54.9 kg)   SpO2 96%   BMI 24.44 kg/m   Physical Exam Constitutional:      Appearance: Normal appearance.  Eyes:     General: No scleral icterus. Musculoskeletal:     Comments: Mild swelling along distal radial side of wrist adjacent to mcp joint.  There is tenderness to palpation with warmth of the joint.  Mild pain with finkelstein test.   Skin:    General: Skin is warm and dry.  Neurological:     General: No focal deficit present.     Mental Status: She is alert.  Psychiatric:        Mood and Affect: Mood normal.        Behavior: Behavior normal.     ------------------------------------------------------------------------------------------------------------------------------------------------------------------------------------------------------------------- Assessment and Plan  Left wrist tendonitis Start prednisone taper.  She may use voltaren gel tid as needed.  Recommend icing to area.  May need to immobilize if not improving.     Meds ordered this encounter  Medications   predniSONE (STERAPRED UNI-PAK 21 TAB) 10 MG (21) TBPK tablet    Sig: Taper as directed on packaging    Dispense:  21 tablet    Refill:  0    No follow-ups on file.    This visit occurred during the SARS-CoV-2 public health emergency.  Safety protocols were in place, including screening questions prior to the visit, additional usage of staff PPE, and extensive cleaning of exam room while observing appropriate contact  time as indicated for disinfecting solutions.

## 2020-08-12 NOTE — Patient Instructions (Signed)
Start prednisone taper as directed on packaging.  Try voltaren gel three times daily to area.  Let us know if not improving.

## 2020-08-12 NOTE — Assessment & Plan Note (Signed)
Start prednisone taper.  She may use voltaren gel tid as needed.  Recommend icing to area.  May need to immobilize if not improving.

## 2020-08-18 ENCOUNTER — Ambulatory Visit (INDEPENDENT_AMBULATORY_CARE_PROVIDER_SITE_OTHER): Payer: BC Managed Care – PPO | Admitting: Orthopedic Surgery

## 2020-08-18 ENCOUNTER — Ambulatory Visit: Payer: Self-pay

## 2020-08-18 DIAGNOSIS — M25532 Pain in left wrist: Secondary | ICD-10-CM

## 2020-08-19 ENCOUNTER — Other Ambulatory Visit: Payer: Self-pay

## 2020-08-25 DIAGNOSIS — J449 Chronic obstructive pulmonary disease, unspecified: Secondary | ICD-10-CM | POA: Diagnosis not present

## 2020-08-26 ENCOUNTER — Ambulatory Visit: Payer: BC Managed Care – PPO | Admitting: Sports Medicine

## 2020-08-29 ENCOUNTER — Other Ambulatory Visit: Payer: Self-pay | Admitting: Osteopathic Medicine

## 2020-08-29 DIAGNOSIS — M19011 Primary osteoarthritis, right shoulder: Secondary | ICD-10-CM

## 2020-08-30 ENCOUNTER — Telehealth: Payer: Self-pay

## 2020-09-01 ENCOUNTER — Encounter: Payer: Self-pay | Admitting: Orthopedic Surgery

## 2020-09-01 NOTE — Progress Notes (Signed)
Office Visit Note   Patient: Sherri Gibson           Date of Birth: [birth year]           MRN: [id number] Visit Date: 08/18/2020 Requested by: Emeterio Reeve, [place] [address] [place],   [id number] PCP: Emeterio Reeve, DO  Subjective: Chief Complaint  Patient presents with   Left Wrist - Pain    HPI: Sherri Gibson is a 60 y.o. female who presents to the office complaining of left wrist pain.  Patient notes worsening left wrist pain over the last couple weeks without injury.  She localizes pain to the ventral aspect of the wrist centrally.  She has not had any falls or recent injuries really.  It is causing pain to the point that is waking her up at night.  She is currently scheduled for right shoulder replacement in August and she wants to make sure that her left wrist is ready for after surgery.  Left shoulder doing well following her shoulder replacement.  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  She has occasional clicking in the wrist..                ROS: All systems reviewed are negative as they relate to the chief complaint within the history of present illness.  Patient denies fevers or chills.  Assessment & Plan: Visit Diagnoses:  1. Pain in left wrist     Plan: Patient is a 60 year old female who presents complaining of weeks of worsening left wrist pain.  She has radiographs taken today that demonstrate widening of the scapholunate interval with small fragmentation off of the radial aspect of the lunate as well as some increased sclerosis of the lunate.  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  Plan for further evaluation with MRI of the left wrist as well as evaluation of the widening of the scapholunate interval.  Follow-up after MRI to review results.  Follow-Up Instructions: No follow-ups on file.   Orders:  Orders Placed This Encounter   Procedures   XR Wrist 2 Views Left   MR Wrist Left w/ contrast   Arthrogram   No orders of the defined types were placed in this encounter.     Procedures: No procedures performed   Clinical Data: No additional findings.  Objective: Vital Signs: There were no vitals taken for this visit.  Physical Exam:  Constitutional: Patient appears well-developed HEENT:  Head: Normocephalic Eyes:EOM are normal Neck: Normal range of motion Cardiovascular: Normal rate Pulmonary/chest: Effort normal Neurologic: Patient is alert Skin: Skin is warm Psychiatric: Patient has normal mood and affect  Ortho Exam: Ortho exam demonstrates left wrist with mild swelling compared to the contralateral side.  No pain with passive extension, flexion, radial and ulnar deviation.  Does have tenderness over the scaphoid tubercle ventrally as well as the 3 4 and 4-5 portal.  No anatomic snuffbox tenderness.  No pain with thumb circumduction.  No tenderness over the ulnar fovea.  Sensation intact through all 5 fingers.  Negative Tinel and negative Phalen sign.  Specialty Comments:  No specialty comments available.  Imaging: No results found.   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   Encounter for screening for lung cancer 04/13/2019   Primary osteoarthritis of both shoulders 03/06/2019   Chronic hypoxemic respiratory failure ([facility]) 12/22/2018   Osteopenia 12/20/2017   Prediabetes 09/23/2017   Colonoscopy refused 05/29/2017   COPD, 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[facility]) 05/29/2017   Multiple pulmonary nodules 05/29/2017   Trigger thumb, right thumb 05/29/2017   Primary osteoarthritis of first  carpometacarpal joint of left hand 05/29/2017   Kidney lesion 12/14/2016   Erythrocytosis 11/13/2016   Breast cancer ([facility]) 09/20/2016   Essential hypertension 09/20/2016   Insomnia 09/20/2016   Mixed hyperlipidemia 09/20/2016   Past Medical History:  Diagnosis Date   Anemia    as a child   Arthritis    Both Shoulders   Breast cancer ([facility]) [id number]   Left    Complication of anesthesia    states she had a hard time staying awake after hand surgery   COPD (chronic obstructive pulmonary disease) ([facility])    uses O2 at 2l at night   High blood pressure    High cholesterol    History of kidney stones    Personal history of radiation therapy 2016   Left Breast Cancer   Pneumonia    as a child   Pre-diabetes    Prediabetes    Wears dentures    upper dentures    Family History  Problem Relation Age of Onset   Breast cancer Mother    Diabetes Brother    Colon cancer Neg Hx    Esophageal cancer Neg Hx     Past Surgical History:  Procedure Laterality Date   BIOPSY  07/11/2020   Procedure: BIOPSY;  Surgeon: Lavena Bullion, DO;  Location: [facility];  Service: Gastroenterology;;   BIOPSY  08/11/2020   Procedure: BIOPSY;  Surgeon: Milus Banister, MD;  Location: [facility];  Service: Endoscopy;;   BREAST BIOPSY     BREAST LUMPECTOMY Left 2021   BREAST LUMPECTOMY WITH AXILLARY LYMPH NODE BIOPSY  2016   COLONOSCOPY  2017   [facility] [place] [facility]. think there was colon polyps   COLONOSCOPY WITH PROPOFOL N/A 07/11/2020   Procedure: COLONOSCOPY WITH PROPOFOL;  Surgeon: Lavena Bullion, DO;  Location: [facility];  Service: Gastroenterology;  Laterality: N/A;   ESOPHAGOGASTRODUODENOSCOPY (EGD) WITH PROPOFOL N/A 07/11/2020   Procedure: ESOPHAGOGASTRODUODENOSCOPY (EGD) WITH PROPOFOL;  Surgeon: Lavena Bullion, DO;  [facility];  Service: Gastroenterology;  Laterality: N/A;   ESOPHAGOGASTRODUODENOSCOPY (EGD) WITH PROPOFOL N/A 08/11/2020    Procedure: ESOPHAGOGASTRODUODENOSCOPY (EGD) WITH PROPOFOL;  Surgeon: Milus Banister, MD;  [facility];  Service: Endoscopy;  Laterality: N/A;   EUS N/A 08/11/2020   Procedure: UPPER ENDOSCOPIC ULTRASOUND (EUS) RADIAL;  Surgeon: Milus Banister, MD;  Location: [facility];  Service: Endoscopy;  Laterality: N/A;   HAND SURGERY Left    PARTIAL HYSTERECTOMY     SAVORY DILATION N/A 07/11/2020   Procedure: SAVORY DILATION;  Surgeon: Lavena Bullion, DO;  [facility];  Service: Gastroenterology;  Laterality: N/A;   TOTAL SHOULDER ARTHROPLASTY Left 04/12/2020   Procedure: LEFT REVERSE TOTAL SHOULDER ARTHROPLASTY;  Surgeon: Meredith Pel, MD;  Location: [facility];  Service: Orthopedics;  Laterality: Left;   Social History   Occupational History   Not on file  Tobacco Use   Smoking status: Former  Packs/day: 2.50    Years: 44.00    Pack years: 110.00    Types: Cigarettes    Quit date: 11/29/2016    Years since quitting: 3.7   Smokeless tobacco: Never  Vaping Use   Vaping Use: Never used  Substance and Sexual Activity   Alcohol use: Not Currently   Drug use: Never   Sexual activity: Yes    Partners: Male    Birth control/protection: None

## 2020-09-02 ENCOUNTER — Ambulatory Visit
Admission: RE | Admit: 2020-09-02 | Discharge: 2020-09-02 | Disposition: A | Discharge status: Home / Self Care | Payer: BC Managed Care – PPO | Source: Ambulatory Visit | Attending: Orthopedic Surgery | Admitting: Orthopedic Surgery

## 2020-09-02 ENCOUNTER — Other Ambulatory Visit: Payer: Self-pay

## 2020-09-02 DIAGNOSIS — M25532 Pain in left wrist: Secondary | ICD-10-CM

## 2020-09-02 DIAGNOSIS — M19032 Primary osteoarthritis, left wrist: Secondary | ICD-10-CM | POA: Diagnosis not present

## 2020-09-02 DIAGNOSIS — S56212A Strain of other flexor muscle, fascia and tendon at forearm level, left arm, initial encounter: Secondary | ICD-10-CM | POA: Diagnosis not present

## 2020-09-02 MED ORDER — GADOBENATE DIMEGLUMINE 529 MG/ML IV SOLN
5.0000 mL | Freq: Once | INTRAVENOUS | Status: AC | PRN
  Administered 2020-09-02: 5 mL via INTRAVENOUS

## 2020-09-02 MED ORDER — IOPAMIDOL (ISOVUE-M 200) INJECTION 41%
1.0000 mL | Freq: Once | INTRAMUSCULAR | Status: AC
  Administered 2020-09-02: 1 mL via INTRA_ARTICULAR

## 2020-09-02 NOTE — Progress Notes (Signed)
Left voicemail for surgical scheduler in Dr. Randel Pigg office requesting pre op orders for this patient.

## 2020-09-02 NOTE — Progress Notes (Signed)
Surgical Instructions    Your procedure is scheduled on Tuesday August 16..  Report to [facility]tr[facility] "A" at 8:56 A.M., then check in with the Admitting office.  Call this number if you have problems the morning of surgery:  [phone]   If you have any questions prior to your surgery date call [phone]: Open Monday-Friday 8am-4pm    Remember:  Do not eat after midnight the night before your surgery.            You may drink clear liquids until 8am the morning of your surgery.   Clear liquids allowed are: Water, Non-Citrus Juices (without pulp), Carbonated Beverages, Clear Tea, Black Coffee Only, and Gatorade     Take these medicines the morning of surgery with A SIP OF WATER:             cyclobenzaprine (FLEXERIL) if needed             pantoprazole (PROTONIX)              sodium chloride (OCEAN) 0.65 % SOLN nasal spray if needed             SPIRIVA RESPIMAT              SYMBICORT              traMADol (ULTRAM) if needed    WHAT DO I DO ABOUT MY DIABETES MEDICATION?  Do not take oral diabetes medicines (pills) the morning of surgery.       DO NOT take metFORMIN (GLUCOPHAGE) the morning of surgery.    HOW TO MANAGE YOUR DIABETES BEFORE AND AFTER SURGERY  Why is it important to control my blood sugar before and after surgery? Improving blood sugar levels before and after surgery helps healing and can limit problems. A way of improving blood sugar control is eating a healthy diet by:  Eating less sugar and carbohydrates  Increasing activity/exercise  Talking with your doctor about reaching your blood sugar goals High blood sugars (greater than 180 mg/dL) can raise your risk of infections and slow your recovery, so you will need to focus on controlling your diabetes during the weeks before surgery. Make sure that the doctor who takes care of your diabetes knows about your planned surgery including the date and location.  How do I manage my blood sugar before  surgery? Check your blood sugar at least 4 times a day, starting 2 days before surgery, to make sure that the level is not too high or low.  Check your blood sugar the morning of your surgery when you wake up and every 2 hours until you get to the Short Stay unit.  If your blood sugar is less than 70 mg/dL, you will need to treat for low blood sugar: Do not take insulin. Treat a low blood sugar (less than 70 mg/dL) with  cup of clear juice (cranberry or apple), 4 glucose tablets, OR glucose gel. Recheck blood sugar in 15 minutes after treatment (to make sure it is greater than 70 mg/dL). If your blood sugar is not greater than 70 mg/dL on recheck, call [phone] for further instructions. Report your blood sugar to the short stay nurse when you get to Short Stay.  If you are admitted to the hospital after surgery: Your blood sugar will be checked by the staff and you will probably be given insulin after surgery (instead of oral diabetes medicines) to make sure you have good blood sugar levels.  The goal for blood sugar control after surgery is 80-180 mg/dL.    As of today, STOP taking any Aspirin (unless otherwise instructed by your surgeon) Aleve, Naproxen, Ibuprofen, Motrin, Advil, Goody's, BC's, all herbal medications, fish oil, and all vitamins.          Do not wear jewelry or makeup Do not wear lotions, powders, perfumes/colognes, or deodorant. Do not shave 48 hours prior to surgery.  Men may shave face and neck. Do not bring valuables to the hospital. DO Not wear nail polish, gel polish, artificial nails, or any other type of covering on  natural nails including finger and toenails. If patients have artificial nails, gel coating, etc. that need to be removed by a nail salon please have this removed prior to surgery or surgery may need to be canceled/delayed if the surgeon/ anesthesia feels like the patient is unable to be adequately monitored.             [facility] is not  responsible for any belongings or valuables.  Do NOT Smoke (Tobacco/Vaping) or drink Alcohol 24 hours prior to your procedure If you use a CPAP at night, you may bring all equipment for your overnight stay.   Contacts, glasses, dentures or bridgework may not be worn into surgery, please bring cases for these belongings   For patients admitted to the hospital, discharge time will be determined by your treatment team.   Patients discharged the day of surgery will not be allowed to drive home, and someone needs to stay with them for 24 hours.  ONLY 1 SUPPORT PERSON MAY BE PRESENT WHILE YOU ARE IN SURGERY. IF YOU ARE TO BE ADMITTED ONCE YOU ARE IN YOUR ROOM YOU WILL BE ALLOWED TWO (2) VISITORS.  Minor children may have two parents present. Special consideration for safety and communication needs will be reviewed on a case by case basis.  Special instructions:    Oral Hygiene is also important to reduce your risk of infection.  Remember - BRUSH YOUR TEETH THE MORNING OF SURGERY WITH YOUR REGULAR TOOTHPASTE   [facility]- Preparing For Surgery  Before surgery, you can play an important role. Because skin is not sterile, your skin needs to be as free of germs as possible. You can reduce the number of germs on your skin by washing with CHG (chlorahexidine gluconate) Soap before surgery.  CHG is an antiseptic cleaner which kills germs and bonds with the skin to continue killing germs even after washing.     Please do not use if you have an allergy to CHG or antibacterial soaps. If your skin becomes reddened/irritated stop using the CHG.  Do not shave (including legs and underarms) for at least 48 hours prior to first CHG shower. It is OK to shave your face.  Please follow these instructions carefully.     Shower the NIGHT BEFORE SURGERY and the MORNING OF SURGERY with CHG Soap.   If you chose to wash your hair, wash your hair first as usual with your normal shampoo. After you shampoo, rinse your  hair and body thoroughly to remove the shampoo.  Then [employer] and genitals (private parts) with your normal soap and rinse thoroughly to remove soap.  After that Use CHG Soap as you would any other liquid soap. You can apply CHG directly to the skin and wash gently with a scrungie or a clean washcloth.   Apply the CHG Soap to your body ONLY FROM THE NECK DOWN.  Do not use on open wounds or open sores. Avoid contact with your eyes, ears, mouth and genitals (private parts). Wash Face and genitals (private parts)  with your normal soap.   Wash thoroughly, paying special attention to the area where your surgery will be performed.  Thoroughly rinse your body with warm water from the neck down.  DO NOT shower/wash with your normal soap after using and rinsing off the CHG Soap.  Pat yourself dry with a CLEAN TOWEL.  Wear CLEAN PAJAMAS to bed the night before surgery  Place CLEAN SHEETS on your bed the night before your surgery  DO NOT SLEEP WITH PETS.   Day of Surgery:  Take a shower with CHG soap. Wear Clean/Comfortable clothing the morning of surgery Do not apply any deodorants/lotions.   Remember to brush your teeth WITH YOUR REGULAR TOOTHPASTE.   Please read over the following fact sheets that you were given.

## 2020-09-05 ENCOUNTER — Encounter (HOSPITAL_COMMUNITY)
Admission: RE | Admit: 2020-09-05 | Discharge: 2020-09-05 | Disposition: A | Discharge status: Home / Self Care | Payer: BC Managed Care – PPO | Source: Ambulatory Visit | Attending: Orthopedic Surgery | Admitting: Orthopedic Surgery

## 2020-09-05 ENCOUNTER — Other Ambulatory Visit: Payer: Self-pay

## 2020-09-05 DIAGNOSIS — Z01812 Encounter for preprocedural laboratory examination: Secondary | ICD-10-CM | POA: Diagnosis not present

## 2020-09-05 DIAGNOSIS — Z7984 Long term (current) use of oral hypoglycemic drugs: Secondary | ICD-10-CM | POA: Insufficient documentation

## 2020-09-05 DIAGNOSIS — Z87891 Personal history of nicotine dependence: Secondary | ICD-10-CM | POA: Diagnosis not present

## 2020-09-05 DIAGNOSIS — J449 Chronic obstructive pulmonary disease, unspecified: Secondary | ICD-10-CM | POA: Insufficient documentation

## 2020-09-05 DIAGNOSIS — Z853 Personal history of malignant neoplasm of breast: Secondary | ICD-10-CM | POA: Insufficient documentation

## 2020-09-05 DIAGNOSIS — Z79899 Other long term (current) drug therapy: Secondary | ICD-10-CM | POA: Insufficient documentation

## 2020-09-05 DIAGNOSIS — R7303 Prediabetes: Secondary | ICD-10-CM | POA: Diagnosis not present

## 2020-09-05 DIAGNOSIS — I10 Essential (primary) hypertension: Secondary | ICD-10-CM | POA: Diagnosis not present

## 2020-09-05 DIAGNOSIS — M19011 Primary osteoarthritis, right shoulder: Secondary | ICD-10-CM | POA: Diagnosis not present

## 2020-09-05 DIAGNOSIS — Z7982 Long term (current) use of aspirin: Secondary | ICD-10-CM | POA: Insufficient documentation

## 2020-09-05 LAB — SURGICAL PCR SCREEN
MRSA, PCR: NEGATIVE
Staphylococcus aureus: NEGATIVE

## 2020-09-05 LAB — BASIC METABOLIC PANEL
Anion gap: 8 (ref 5–15)
BUN: 14 mg/dL (ref 6–20)
CO2: 28 mmol/L (ref 22–32)
Calcium: 9.4 mg/dL (ref 8.9–10.3)
Chloride: 101 mmol/L (ref 98–111)
Creatinine, Ser: 0.81 mg/dL (ref 0.44–1.00)
GFR, Estimated: >60 mL/min (ref >60)
Glucose, Bld: 108 mg/dL — ABNORMAL HIGH (ref 70–99)
Potassium: 4.4 mmol/L (ref 3.5–5.1)
Sodium: 137 mmol/L (ref 135–145)

## 2020-09-05 LAB — CBC
HCT: 39.7 % (ref 36.0–46.0)
Hemoglobin: 12.7 g/dL (ref 12.0–15.0)
MCH: 28.1 pg (ref 26.0–34.0)
MCHC: 32 g/dL (ref 30.0–36.0)
MCV: 87.8 fL (ref 80.0–100.0)
Platelets: 412 10*3/uL — ABNORMAL HIGH (ref 150–400)
RBC: 4.52 MIL/uL (ref 3.87–5.11)
RDW: 13.8 % (ref 11.5–15.5)
WBC: 12.6 10*3/uL — ABNORMAL HIGH (ref 4.0–10.5)
nRBC: 0 % (ref 0.0–0.2)

## 2020-09-05 LAB — HEMOGLOBIN A1C
Hgb A1c MFr Bld: 7 % — ABNORMAL HIGH (ref 4.8–5.6)
Mean Plasma Glucose: 154.2 mg/dL

## 2020-09-05 LAB — GLUCOSE, CAPILLARY: Glucose-Capillary: 102 mg/dL — ABNORMAL HIGH (ref 70–99)

## 2020-09-05 MED ORDER — ORAL CARE MOUTH RINSE: 15.0000 mL | Freq: Once | OROMUCOSAL | Status: DC

## 2020-09-05 MED ORDER — CHLORHEXIDINE GLUCONATE 0.12 % MT SOLN: 15.0000 mL | Freq: Once | OROMUCOSAL | Status: DC

## 2020-09-05 MED ORDER — LACTATED RINGERS IV SOLN: INTRAVENOUS | Status: DC

## 2020-09-05 NOTE — Progress Notes (Addendum)
Surgical Instructions    Your procedure is scheduled on Tuesday August 16..  Report to [facility] "A" at 8:56 A.M., then check in with the Admitting office.  Call this number if you have problems the morning of surgery:  [phone]   If you have any questions prior to your surgery date call [phone]: Open Monday-Friday 8am-4pm    Remember:  Do not eat after midnight the night before your surgery.            You may drink clear liquids until 8am the morning of your surgery.   Clear liquids allowed are: Water, Non-Citrus Juices (without pulp), Carbonated Beverages, Clear Tea, Black Coffee Only, and Gatorade     Take these medicines the morning of surgery with A SIP OF WATER:             cyclobenzaprine (FLEXERIL) if needed             pantoprazole (PROTONIX)              sodium chloride (OCEAN) 0.65 % SOLN nasal spray if needed             SPIRIVA RESPIMAT              SYMBICORT              traMADol (ULTRAM) if needed    WHAT DO I DO ABOUT MY DIABETES MEDICATION?  Do not take oral diabetes medicines (pills) the morning of surgery.       DO NOT take metFORMIN (GLUCOPHAGE) the morning of surgery.    HOW TO MANAGE YOUR DIABETES BEFORE AND AFTER SURGERY  Why is it important to control my blood sugar before and after surgery? Improving blood sugar levels before and after surgery helps healing and can limit problems. A way of improving blood sugar control is eating a healthy diet by:  Eating less sugar and carbohydrates  Increasing activity/exercise  Talking with your doctor about reaching your blood sugar goals High blood sugars (greater than 180 mg/dL) can raise your risk of infections and slow your recovery, so you will need to focus on controlling your diabetes during the weeks before surgery. Make sure that the doctor who takes care of your diabetes knows about your planned surgery including the date and location.  How do I manage my blood sugar before  surgery? Check your blood sugar at least 4 times a day, starting 2 days before surgery, to make sure that the level is not too high or low.  Check your blood sugar the morning of your surgery when you wake up and every 2 hours until you get to the Short Stay unit.  If your blood sugar is less than 70 mg/dL, you will need to treat for low blood sugar: Do not take insulin. Treat a low blood sugar (less than 70 mg/dL) with  cup of clear juice (cranberry or apple), 4 glucose tablets, OR glucose gel. Recheck blood sugar in 15 minutes after treatment (to make sure it is greater than 70 mg/dL). If your blood sugar is not greater than 70 mg/dL on recheck, call [phone] for further instructions. Report your blood sugar to the short stay nurse when you get to Short Stay.  If you are admitted to the hospital after surgery: Your blood sugar will be checked by the staff and you will probably be given insulin after surgery (instead of oral diabetes medicines) to make sure you have good blood sugar levels.  The goal for blood sugar control after surgery is 80-180 mg/dL.    As of today, STOP taking any Aspirin (unless otherwise instructed by your surgeon) Aleve, Naproxen, Ibuprofen, Motrin, Advil, Goody's, BC's, all herbal medications, fish oil, and all vitamins.          Do not wear jewelry or makeup Do not wear lotions, powders, perfumes/colognes, or deodorant. Do not shave 48 hours prior to surgery.  Men may shave face and neck. Do not bring valuables to the hospital. DO Not wear nail polish, gel polish, artificial nails, or any other type of covering on  natural nails including finger and toenails. If patients have artificial nails, gel coating, etc. that need to be removed by a nail salon please have this removed prior to surgery or surgery may need to be canceled/delayed if the surgeon/ anesthesia feels like the patient is unable to be adequately monitored.             [facility] is not  responsible for any belongings or valuables.  Do NOT Smoke (Tobacco/Vaping) or drink Alcohol 24 hours prior to your procedure If you use a CPAP at night, you may bring all equipment for your overnight stay.   Contacts, glasses, dentures or bridgework may not be worn into surgery, please bring cases for these belongings   For patients admitted to the hospital, discharge time will be determined by your treatment team.   Patients discharged the day of surgery will not be allowed to drive home, and someone needs to stay with them for 24 hours.  ONLY 1 SUPPORT PERSON MAY BE PRESENT WHILE YOU ARE IN SURGERY. IF YOU ARE TO BE ADMITTED ONCE YOU ARE IN YOUR ROOM YOU WILL BE ALLOWED TWO (2) VISITORS.  Minor children may have two parents present. Special consideration for safety and communication needs will be reviewed on a case by case basis.  Special instructions:   Oral Hygiene is also important to reduce your risk of infection.  Remember - BRUSH YOUR TEETH THE MORNING OF SURGERY WITH YOUR REGULAR TOOTHPASTE  [facility]- Preparing for Total Shoulder Arthroplasty  Before surgery, you can play an important role. Because skin is not sterile, your skin needs to be as free of germs as possible. You can reduce the number of germs on your skin by using the following products.   Benzoyl Peroxide Gel  o Reduces the number of germs present on the skin  o Applied twice a day to shoulder area starting two days before surgery   Chlorhexidine Gluconate (CHG) Soap (instructions listed above on how to wash with CHG Soap)  o An antiseptic cleaner that kills germs and bonds with the skin to continue killing germs even after washing  o Used for showering the night before surgery and morning of surgery   ==================================================================  Please follow these instructions carefully:  BENZOYL PEROXIDE 5% GEL  Please do not use if you have an allergy to benzoyl peroxide.  If your skin becomes reddened/irritated stop using the benzoyl peroxide.  Starting two days before surgery, apply as follows:  1. Apply benzoyl peroxide in the morning and at night. Apply after taking a shower. If you are not taking a shower clean entire shoulder front, back, and side along with the armpit with a clean wet washcloth.  2. Place a quarter-sized dollop on your SHOULDER and rub in thoroughly, making sure to cover the front, back, and side of your shoulder, along with the armpit.   2  Days prior to Surgery First Dose on _____________ Morning Second Dose on ______________ Night  Day Before Surgery First Dose on ______________ Morning Night before surgery wash (entire body except face and private areas) with CHG Soap THEN Second Dose on ____________ Night   Morning of Surgery  wash BODY AGAIN with CHG Soap   4. Do NOT apply benzoyl peroxide gel on the day of surgery     Please do not use if you have an allergy to CHG or antibacterial soaps. If your skin becomes reddened/irritated stop using the CHG.  Do not shave (including legs and underarms) for at least 48 hours prior to first CHG shower. It is OK to shave your face.  Please follow these instructions carefully.     Shower the NIGHT BEFORE SURGERY and the MORNING OF SURGERY with CHG Soap.   If you chose to wash your hair, wash your hair first as usual with your normal shampoo. After you shampoo, rinse your hair and body thoroughly to remove the shampoo.  Then [employer] and genitals (private parts) with your normal soap and rinse thoroughly to remove soap.  After that Use CHG Soap as you would any other liquid soap. You can apply CHG directly to the skin and wash gently with a scrungie or a clean washcloth.   Apply the CHG Soap to your body ONLY FROM THE NECK DOWN.  Do not use on open wounds or open sores. Avoid contact with your eyes, ears, mouth and genitals (private parts). Wash Face and genitals (private parts)   with your normal soap.   Wash thoroughly, paying special attention to the area where your surgery will be performed.  Thoroughly rinse your body with warm water from the neck down.  DO NOT shower/wash with your normal soap after using and rinsing off the CHG Soap.  Pat yourself dry with a CLEAN TOWEL.  8. Apply the Benzoyl Peroxide only the night before surgery.  Do Not use it the morning of surgery.  Wear CLEAN PAJAMAS to bed the night before surgery  Place CLEAN SHEETS on your bed the night before your surgery  DO NOT SLEEP WITH PETS.   Day of Surgery: Take a shower with CHG soap. Wear Clean/Comfortable clothing the morning of surgery Do not apply any deodorants/lotions.   Remember to brush your teeth WITH YOUR REGULAR TOOTHPASTE.   Please read over the following fact sheets that you were given.

## 2020-09-05 NOTE — Progress Notes (Signed)
PCP - Emeterio Reeve DO Cardiologist - denies  PPM/ICD - denies Device Orders -  Rep Notified -   Chest x-ray - none EKG - 04/12/20 Stress Test - denies ECHO - denies Cardiac Cath - denies  Sleep Study - denies CPAP -   Fasting Blood Sugar - Pt does not check her blood sugars   Blood Thinner Instructions:n/a Aspirin Instructions:  ERAS Protcol - clear liquids until 0800 PRE-SURGERY Ensure or G2- no  COVID TEST- n/a -ambulatory surgery   Anesthesia review: yes,home O2  Patient denies shortness of breath, fever, cough and chest pain at PAT appointment   All instructions explained to the patient, with a verbal understanding of the material. Patient agrees to go over the instructions while at home for a better understanding. Patient also instructed to self quarantine after being tested for COVID-19. The opportunity to ask questions was provided.

## 2020-09-06 NOTE — Anesthesia Preprocedure Evaluation (Addendum)
Anesthesia Evaluation  Patient identified by MRN, date of birth, ID band  Reviewed: Allergy & Precautions, NPO status , Patient's Chart, lab work & pertinent test results  Airway Mallampati: II  TM Distance: >3 FB Neck ROM: Full    Dental  (+) Edentulous Upper, Edentulous Lower   Pulmonary COPD (2L O2),  COPD inhaler and oxygen dependent, former smoker,    Pulmonary exam normal breath sounds clear to auscultation       Cardiovascular hypertension, Pt. on medications Normal cardiovascular exam Rhythm:Regular Rate:Normal     Neuro/Psych negative neurological ROS  negative psych ROS   GI/Hepatic negative GI ROS, Neg liver ROS,   Endo/Other  diabetes, Oral Hypoglycemic Agents  Renal/GU negative Renal ROS  negative genitourinary   Musculoskeletal  (+) Arthritis , Osteoarthritis,    Abdominal   Peds  Hematology   Anesthesia Other Findings   Reproductive/Obstetrics                            Anesthesia Physical Anesthesia Plan  ASA: 4  Anesthesia Plan: General and Regional   Post-op Pain Management: GA combined w/ Regional for post-op pain   Induction: Intravenous  PONV Risk Score and Plan: 3 and Treatment may vary due to age or medical condition, Ondansetron and Dexamethasone  Airway Management Planned: Oral ETT  Additional Equipment: None  Intra-op Plan:   Post-operative Plan: Extubation in OR  Informed Consent: I have reviewed the patients History and Physical, chart, labs and discussed the procedure including the risks, benefits and alternatives for the proposed anesthesia with the patient or authorized representative who has indicated his/her understanding and acceptance.     Dental advisory given  Plan Discussed with: CRNA, Anesthesiologist and Surgeon  Anesthesia Plan Comments: (PAT note written 09/06/2020 by Myra Gianotti, PA-C. )       Anesthesia Quick Evaluation

## 2020-09-06 NOTE — Progress Notes (Signed)
Anesthesia Chart Review:  Case: [id number] Date/Time: 09/13/20 1041   Procedure: RIGHT TOTAL SHOULDER ARTHROPLASTY (Right: Shoulder)   Anesthesia type: General   Pre-op diagnosis: right shoulder osteoarthritis   Location: [facility] OR ROOM 06 / [place] [facility]   Surgeons: Meredith Pel, MD       DISCUSSION: Patient is a 60 year old female scheduled for the above procedure.  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 Loanne Drilling in March 2022 prior to undergoing previous left shoulder arthroplasty. Her severe COPD was felt well controlled at that time on Symbicort and Spiriva. Per walk test  on 04/05/20, "SpO2 86% with activity and improved to SpO2 95% on 2L CONTINUE 2L O2 via [place] with activity and sleep". She has annual chest CT for pulmonary nodules (benign in appearance 04/2020) in a former smoker.  She tolerated left reverse total shoulder on 04/12/20 and opted to be discharged home that same day. She had an ED visit on 04/14/20 for episode of bleeding at the incision site while doing ROM exercises, but no further episodes or concern for infection. She had routine colonoscopy and a EGD for pill dysphagia on 07/12/15, followed by EUS for gastric nodule with no malignancy identified, consider 1-2 year follow-up EGD.   Surgeon orders not available at PAT visit.  Case is posted as ambulatory surgery.  Anesthesia team to evaluate on the day the surgery.   VS: BP 113/82   Pulse 99   Temp 37 C   Resp 18   Ht '4\' 11"'$  (1.499 m)   Wt 53.7 kg   SpO2 100%   BMI 23.89 kg/m    PROVIDERS: Emeterio Reeve, DO  is PCP  Margaretha Seeds, MD is pulmonologist Tish Men, MD is HEM-ONC. Last visit 12/18/19 with Laverna Peace, NP.    LABS: Labs reviewed: Acceptable for surgery. (all labs ordered are listed, but only abnormal results are displayed)  Labs Reviewed  GLUCOSE, CAPILLARY - Abnormal; Notable for the following components:      Result Value   Glucose-Capillary 102 (*)    All other components within normal limits  SURGICAL PCR SCREEN    OTHER: Upper EUS 08/11/20 Ardis Hughs, Quillian Quince, MD): Impression:  - Incidental 8.5 mm calcified heterogeneous subepithelial lesion that appears to communicate with the muscularis propria layer of the gastric fundus. sampled this lesion with tunnel biopsies using forceps. Await final pathology results. If the results arenondiagnostic I will likely recommend repeat evaluation in 1 or 2 years. (Pathology: Benign gastric mucosa with focal chronic inflammation, no dysplasia or malignancy; A submucosal lesion not identified, although fragments somewhat superficial.)  EGD 07/11/20: Impression: - LA Grade A reflux esophagitis with no bleeding. - Normal upper third of esophagus, middle third of esophagus and lower third of esophagus. Dilated. - Gastritis. Biopsied. - A single mucosal nodule found in the stomach. Biopsied. - Normal examined duodenum. (Pathology: Stomach biopsy: Gastric antral and oxyntic mucosa with focal erosion and slight inflammation, no metaplasia, dysplasia or malignancy; negative H. Pylori; Stomach nodule: Benign gastric mucosa with slight hyperemia, no intestinal metaplasia, dysplasia, or carcinoma.)  Colonoscopy 07/11/20: Impression: - Preparation of the colon was fair. - Stool and solid food debris  in the entire examined colon. - The visualized mucosa was otherwise normal appearing. - The distal rectum and anal verge are normal on retroflexion view. - The examined portion of the ileum was normal. - No specimens collected.  PFTs  10/13/15: Interpretation: Spirometry is adequate for interpretation shows moderately severe airflow obstruction.  Postbronchodilator spirometry demonstrates no bronchodilator responsiveness.  Formal lung volume analysis is adequate for interpretation shows moderate restriction.  The diffusion study is adequate for interpretation shows severely reduced diffusing capacity.   IMAGES: CT Right shoulder 07/15/20: IMPRESSION: 1. Moderately advanced right glenohumeral degenerative changes with a joint effusion and several intra-articular loose bodies. The humeral head is mildly subluxed posteriorly. 2. No acute osseous findings. 3. Mild acromioclavicular degenerative changes.   CT Chest Lung Cancer screen 05/16/20: IMPRESSION: 1. Lung-RADS 2, benign appearance or behavior. Continue annual screening with low-dose chest CT without contrast in 12 months. 2. Stable mildly complex cyst arising off the upper pole of left kidney. This is incompletely characterized without IV contrast material. 3. Aortic Atherosclerosis (ICD10-I70.0) and Emphysema (ICD10-J43.9).   EKG: 04/12/20: Normal sinus rhythm Low voltage QRS Borderline ECG No previous tracing Confirmed by Kirk Ruths [phone]) on 04/12/2020 10:13:36 AM   CV: US Carotid 12/03/17: IMPRESSION: Less than 50% stenosis in the right and left internal carotid arteries. Elevated velocities are a result of marked internal carotid artery tortuosity.  Past Medical History:  Diagnosis Date   Anemia    as a child   Arthritis    Both Shoulders   Breast cancer ([place]) Q000111Q   Left    Complication of anesthesia    states she had a hard time staying awake after hand surgery   COPD (chronic obstructive pulmonary disease) ([place])    uses O2 at 2l at night   High blood pressure    High cholesterol    History of kidney stones    Personal history of radiation therapy 2016   Left Breast Cancer   Pneumonia    as a child   Pre-diabetes    Prediabetes     Wears dentures    upper dentures    Past Surgical History:  Procedure Laterality Date   BIOPSY  07/11/2020   Procedure: BIOPSY;  Surgeon: Lavena Bullion, DO;  Location: [facility];  Service: Gastroenterology;;   BIOPSY  08/11/2020   Procedure: BIOPSY;  Surgeon: Milus Banister, MD;  Location: [facility];  Service: Endoscopy;;   BREAST BIOPSY     BREAST LUMPECTOMY Left 2021   BREAST LUMPECTOMY WITH AXILLARY LYMPH NODE BIOPSY  2016   COLONOSCOPY  2017   [facility] [place] [facility]. think there was colon polyps   COLONOSCOPY WITH PROPOFOL N/A 07/11/2020   Procedure: COLONOSCOPY WITH PROPOFOL;  Surgeon: Lavena Bullion, DO;  [facility]: [facility];  Service: Gastroenterology;  Laterality: N/A;   ESOPHAGOGASTRODUODENOSCOPY (EGD) WITH PROPOFOL N/A 07/11/2020   Procedure: ESOPHAGOGASTRODUODENOSCOPY (EGD) WITH PROPOFOL;  Surgeon: Lavena Bullion, DO;  [facility];  Service: Gastroenterology;  Laterality: N/A;   ESOPHAGOGASTRODUODENOSCOPY (EGD) WITH PROPOFOL N/A 08/11/2020   Procedure: ESOPHAGOGASTRODUODENOSCOPY (EGD) WITH PROPOFOL;  Surgeon: Milus Banister, MD;  [facility];  Service: Endoscopy;  Laterality: N/A;   EUS N/A 08/11/2020   Procedure: UPPER ENDOSCOPIC ULTRASOUND (EUS) RADIAL;  Surgeon: Milus Banister, MD;  [facility]: [facility];  Service: Endoscopy;  Laterality: N/A;   HAND SURGERY Left    PARTIAL HYSTERECTOMY     SAVORY DILATION N/A 07/11/2020  Procedure: SAVORY DILATION;  Surgeon: Lavena Bullion, DO;  [facility];  Service: Gastroenterology;  Laterality: N/A;   TOTAL SHOULDER ARTHROPLASTY Left 04/12/2020   Procedure: LEFT REVERSE TOTAL SHOULDER ARTHROPLASTY;  Surgeon: Meredith Pel, MD;  Location: [place];  Service: Orthopedics;  Laterality: Left;    MEDICATIONS:  albuterol (VENTOLIN HFA) 108 (90 Base) MCG/ACT inhaler   amLODipine (NORVASC) 10 MG tablet   aspirin EC 81 MG tablet   atorvastatin  (LIPITOR) 40 MG tablet   Calcium Carb-Cholecalciferol (CALCIUM 600-D PO)   cyclobenzaprine (FLEXERIL) 10 MG tablet   loratadine (CLARITIN) 10 MG tablet   losartan (COZAAR) 50 MG tablet   metFORMIN (GLUCOPHAGE) 500 MG tablet   montelukast (SINGULAIR) 10 MG tablet   Multiple Vitamins-Minerals (MULTIVITAMIN WITH MINERALS) tablet   OXYGEN   pantoprazole (PROTONIX) 40 MG tablet   predniSONE (STERAPRED UNI-PAK 21 TAB) 10 MG (21) TBPK tablet   sodium chloride (OCEAN) 0.65 % SOLN nasal spray   SPIRIVA RESPIMAT 2.5 MCG/ACT AERS   SYMBICORT 160-4.5 MCG/ACT inhaler   traMADol (ULTRAM) 50 MG tablet   traZODone (DESYREL) 50 MG tablet   No current facility-administered medications for this encounter.  She is not currently on the steroid taper.  Myra Gianotti, PA-C Surgical Short Stay/Anesthesiology [facility] Phone [phone] [facility] Phone [phone] 09/06/2020 4:22 PM

## 2020-09-13 ENCOUNTER — Encounter (HOSPITAL_COMMUNITY)
Admission: RE | Disposition: A | Discharge status: Home / Self Care | Payer: Self-pay | Source: Ambulatory Visit | Attending: Orthopedic Surgery

## 2020-09-13 ENCOUNTER — Ambulatory Visit (HOSPITAL_COMMUNITY): Payer: BC Managed Care – PPO | Admitting: Anesthesiology

## 2020-09-13 ENCOUNTER — Encounter (HOSPITAL_COMMUNITY): Payer: Self-pay | Admitting: Orthopedic Surgery

## 2020-09-13 ENCOUNTER — Other Ambulatory Visit: Payer: Self-pay

## 2020-09-13 ENCOUNTER — Ambulatory Visit (HOSPITAL_COMMUNITY)
Admission: RE | Admit: 2020-09-13 | Discharge: 2020-09-13 | Disposition: A | Discharge status: Home / Self Care | Payer: BC Managed Care – PPO | Source: Ambulatory Visit | Attending: Orthopedic Surgery | Admitting: Orthopedic Surgery

## 2020-09-13 ENCOUNTER — Ambulatory Visit (HOSPITAL_COMMUNITY): Payer: BC Managed Care – PPO | Admitting: Vascular Surgery

## 2020-09-13 DIAGNOSIS — Z7984 Long term (current) use of oral hypoglycemic drugs: Secondary | ICD-10-CM | POA: Insufficient documentation

## 2020-09-13 DIAGNOSIS — Z7952 Long term (current) use of systemic steroids: Secondary | ICD-10-CM | POA: Insufficient documentation

## 2020-09-13 DIAGNOSIS — Z9981 Dependence on supplemental oxygen: Secondary | ICD-10-CM | POA: Diagnosis not present

## 2020-09-13 DIAGNOSIS — E119 Type 2 diabetes mellitus without complications: Secondary | ICD-10-CM | POA: Diagnosis not present

## 2020-09-13 DIAGNOSIS — Z7982 Long term (current) use of aspirin: Secondary | ICD-10-CM | POA: Diagnosis not present

## 2020-09-13 DIAGNOSIS — Z885 Allergy status to narcotic agent status: Secondary | ICD-10-CM | POA: Insufficient documentation

## 2020-09-13 DIAGNOSIS — Z887 Allergy status to serum and vaccine status: Secondary | ICD-10-CM | POA: Diagnosis not present

## 2020-09-13 DIAGNOSIS — Z803 Family history of malignant neoplasm of breast: Secondary | ICD-10-CM | POA: Insufficient documentation

## 2020-09-13 DIAGNOSIS — Z87891 Personal history of nicotine dependence: Secondary | ICD-10-CM | POA: Insufficient documentation

## 2020-09-13 DIAGNOSIS — Z833 Family history of diabetes mellitus: Secondary | ICD-10-CM | POA: Diagnosis not present

## 2020-09-13 DIAGNOSIS — Z7951 Long term (current) use of inhaled steroids: Secondary | ICD-10-CM | POA: Diagnosis not present

## 2020-09-13 DIAGNOSIS — G8918 Other acute postprocedural pain: Secondary | ICD-10-CM | POA: Diagnosis not present

## 2020-09-13 DIAGNOSIS — Z79899 Other long term (current) drug therapy: Secondary | ICD-10-CM | POA: Diagnosis not present

## 2020-09-13 DIAGNOSIS — I10 Essential (primary) hypertension: Secondary | ICD-10-CM | POA: Diagnosis not present

## 2020-09-13 DIAGNOSIS — J449 Chronic obstructive pulmonary disease, unspecified: Secondary | ICD-10-CM | POA: Insufficient documentation

## 2020-09-13 DIAGNOSIS — M19011 Primary osteoarthritis, right shoulder: Secondary | ICD-10-CM | POA: Diagnosis not present

## 2020-09-13 HISTORY — PX: TOTAL SHOULDER ARTHROPLASTY: SHX126

## 2020-09-13 LAB — GLUCOSE, CAPILLARY: Glucose-Capillary: 115 mg/dL — ABNORMAL HIGH (ref 70–99)

## 2020-09-13 SURGERY — ARTHROPLASTY, SHOULDER, TOTAL
Anesthesia: Regional | Site: Shoulder | Laterality: Right

## 2020-09-13 MED ORDER — ACETAMINOPHEN 325 MG PO TABS: 325.0000 mg | ORAL_TABLET | ORAL | Status: DC | PRN

## 2020-09-13 MED ORDER — ORAL CARE MOUTH RINSE: 15.0000 mL | Freq: Once | OROMUCOSAL | Status: AC

## 2020-09-13 MED ORDER — VANCOMYCIN HCL 1000 MG IV SOLR
INTRAVENOUS | Status: AC
  Filled 2020-09-13: qty 20

## 2020-09-13 MED ORDER — ONDANSETRON HCL 4 MG/2ML IJ SOLN
INTRAMUSCULAR | Status: AC
  Filled 2020-09-13: qty 2

## 2020-09-13 MED ORDER — ONDANSETRON HCL 4 MG/2ML IJ SOLN
INTRAMUSCULAR | Status: DC | PRN
  Administered 2020-09-13: 4 mg via INTRAVENOUS

## 2020-09-13 MED ORDER — ONDANSETRON HCL 4 MG/2ML IJ SOLN
4.0000 mg | Freq: Once | INTRAMUSCULAR | Status: DC | PRN
Start: 2020-09-13 — End: 2020-09-13

## 2020-09-13 MED ORDER — ROCURONIUM BROMIDE 10 MG/ML (PF) SYRINGE
PREFILLED_SYRINGE | INTRAVENOUS | Status: DC | PRN
  Administered 2020-09-13: 60 mg via INTRAVENOUS

## 2020-09-13 MED ORDER — PHENYLEPHRINE 40 MCG/ML (10ML) SYRINGE FOR IV PUSH (FOR BLOOD PRESSURE SUPPORT)
PREFILLED_SYRINGE | INTRAVENOUS | Status: AC
  Filled 2020-09-13: qty 10

## 2020-09-13 MED ORDER — BUPIVACAINE LIPOSOME 1.3 % IJ SUSP
INTRAMUSCULAR | Status: DC | PRN
  Administered 2020-09-13: 10 mL

## 2020-09-13 MED ORDER — ACETAMINOPHEN 160 MG/5ML PO SOLN: 325.0000 mg | ORAL | Status: DC | PRN

## 2020-09-13 MED ORDER — TRANEXAMIC ACID-NACL 1000-0.7 MG/100ML-% IV SOLN
1000.0000 mg | INTRAVENOUS | Status: AC
  Administered 2020-09-13: 1000 mg via INTRAVENOUS
  Filled 2020-09-13: qty 100

## 2020-09-13 MED ORDER — 0.9 % SODIUM CHLORIDE (POUR BTL) OPTIME
TOPICAL | Status: DC | PRN
  Administered 2020-09-13: 6000 mL

## 2020-09-13 MED ORDER — FENTANYL CITRATE (PF) 250 MCG/5ML IJ SOLN
INTRAMUSCULAR | Status: AC
  Filled 2020-09-13: qty 5

## 2020-09-13 MED ORDER — PHENYLEPHRINE HCL-NACL 20-0.9 MG/250ML-% IV SOLN
INTRAVENOUS | Status: DC | PRN
Start: 2020-09-13 — End: 2020-09-13
  Administered 2020-09-13: 40 ug/min via INTRAVENOUS

## 2020-09-13 MED ORDER — ROCURONIUM BROMIDE 10 MG/ML (PF) SYRINGE
PREFILLED_SYRINGE | INTRAVENOUS | Status: AC
  Filled 2020-09-13: qty 10

## 2020-09-13 MED ORDER — LACTATED RINGERS IV SOLN: INTRAVENOUS | Status: DC

## 2020-09-13 MED ORDER — FENTANYL CITRATE (PF) 100 MCG/2ML IJ SOLN: 25.0000 ug | INTRAMUSCULAR | Status: DC | PRN

## 2020-09-13 MED ORDER — MIDAZOLAM HCL 2 MG/2ML IJ SOLN
INTRAMUSCULAR | Status: AC
  Filled 2020-09-13: qty 2

## 2020-09-13 MED ORDER — BUPIVACAINE-EPINEPHRINE 0.5% -1:200000 IJ SOLN: INTRAMUSCULAR | Status: DC | PRN

## 2020-09-13 MED ORDER — OXYCODONE HCL 5 MG PO TABS
5.0000 mg | ORAL_TABLET | Freq: Once | ORAL | Status: DC | PRN
Start: 2020-09-13 — End: 2020-09-13

## 2020-09-13 MED ORDER — MEPERIDINE HCL 25 MG/ML IJ SOLN: 6.2500 mg | INTRAMUSCULAR | Status: DC | PRN

## 2020-09-13 MED ORDER — VANCOMYCIN HCL 1000 MG IV SOLR
INTRAVENOUS | Status: DC | PRN
  Administered 2020-09-13: 1000 mg via TOPICAL

## 2020-09-13 MED ORDER — ALBUMIN HUMAN 5 % IV SOLN: INTRAVENOUS | Status: DC | PRN

## 2020-09-13 MED ORDER — CHLORHEXIDINE GLUCONATE 0.12 % MT SOLN
15.0000 mL | Freq: Once | OROMUCOSAL | Status: AC
  Administered 2020-09-13: 15 mL via OROMUCOSAL
  Filled 2020-09-13: qty 15

## 2020-09-13 MED ORDER — IRRISEPT - 450ML BOTTLE WITH 0.05% CHG IN STERILE WATER, USP 99.95% OPTIME
TOPICAL | Status: DC | PRN
  Administered 2020-09-13: 450 mL

## 2020-09-13 MED ORDER — GABAPENTIN 300 MG PO CAPS: 300.0000 mg | ORAL_CAPSULE | Freq: Three times a day (TID) | ORAL | 0 refills | Status: DC

## 2020-09-13 MED ORDER — OXYCODONE HCL 5 MG/5ML PO SOLN: 5.0000 mg | Freq: Once | ORAL | Status: DC | PRN

## 2020-09-13 MED ORDER — SUGAMMADEX SODIUM 200 MG/2ML IV SOLN
INTRAVENOUS | Status: DC | PRN
  Administered 2020-09-13: 200 mg via INTRAVENOUS

## 2020-09-13 MED ORDER — PHENYLEPHRINE 40 MCG/ML (10ML) SYRINGE FOR IV PUSH (FOR BLOOD PRESSURE SUPPORT)
PREFILLED_SYRINGE | INTRAVENOUS | Status: DC | PRN
  Administered 2020-09-13: 40 ug via INTRAVENOUS
  Administered 2020-09-13 (×2): 80 ug via INTRAVENOUS
  Administered 2020-09-13: 40 ug via INTRAVENOUS

## 2020-09-13 MED ORDER — BUPIVACAINE HCL (PF) 0.5 % IJ SOLN
INTRAMUSCULAR | Status: DC | PRN
  Administered 2020-09-13: 15 mL

## 2020-09-13 MED ORDER — DEXAMETHASONE SODIUM PHOSPHATE 10 MG/ML IJ SOLN
INTRAMUSCULAR | Status: AC
  Filled 2020-09-13: qty 1

## 2020-09-13 MED ORDER — FENTANYL CITRATE (PF) 100 MCG/2ML IJ SOLN: 50.0000 ug | Freq: Once | INTRAMUSCULAR | Status: AC

## 2020-09-13 MED ORDER — PROPOFOL 10 MG/ML IV BOLUS
INTRAVENOUS | Status: AC
  Filled 2020-09-13: qty 20

## 2020-09-13 MED ORDER — LIDOCAINE 2% (20 MG/ML) 5 ML SYRINGE
INTRAMUSCULAR | Status: AC
  Filled 2020-09-13: qty 5

## 2020-09-13 MED ORDER — FENTANYL CITRATE (PF) 100 MCG/2ML IJ SOLN
INTRAMUSCULAR | Status: AC
  Administered 2020-09-13: 50 ug via INTRAVENOUS
  Filled 2020-09-13: qty 2

## 2020-09-13 MED ORDER — BUPIVACAINE HCL (PF) 0.5 % IJ SOLN
INTRAMUSCULAR | Status: AC
  Filled 2020-09-13: qty 30

## 2020-09-13 MED ORDER — MIDAZOLAM HCL 2 MG/2ML IJ SOLN
INTRAMUSCULAR | Status: DC | PRN
  Administered 2020-09-13: 1 mg via INTRAVENOUS

## 2020-09-13 MED ORDER — POVIDONE-IODINE 10 % EX SWAB
2.0000 "application " | Freq: Once | CUTANEOUS | Status: AC
  Administered 2020-09-13: 2 via TOPICAL

## 2020-09-13 MED ORDER — CEPHALEXIN 500 MG PO CAPS: 1000.0000 mg | ORAL_CAPSULE | Freq: Once | ORAL | 0 refills | Status: AC

## 2020-09-13 MED ORDER — DEXAMETHASONE SODIUM PHOSPHATE 10 MG/ML IJ SOLN
INTRAMUSCULAR | Status: DC | PRN
Start: 2020-09-13 — End: 2020-09-13
  Administered 2020-09-13: 5 mg via INTRAVENOUS

## 2020-09-13 MED ORDER — LIDOCAINE 2% (20 MG/ML) 5 ML SYRINGE
INTRAMUSCULAR | Status: DC | PRN
  Administered 2020-09-13: 60 mg via INTRAVENOUS

## 2020-09-13 MED ORDER — BUPIVACAINE HCL 0.5 % IJ SOLN: INTRAMUSCULAR | Status: DC | PRN

## 2020-09-13 MED ORDER — PROPOFOL 10 MG/ML IV BOLUS
INTRAVENOUS | Status: DC | PRN
  Administered 2020-09-13: 110 mg via INTRAVENOUS
  Administered 2020-09-13: 60 mg via INTRAVENOUS
  Administered 2020-09-13: 30 mg via INTRAVENOUS

## 2020-09-13 MED ORDER — FENTANYL CITRATE (PF) 250 MCG/5ML IJ SOLN
INTRAMUSCULAR | Status: DC | PRN
  Administered 2020-09-13 (×2): 50 ug via INTRAVENOUS

## 2020-09-13 MED ORDER — POVIDONE-IODINE 7.5 % EX SOLN
Freq: Once | CUTANEOUS | Status: DC
  Filled 2020-09-13: qty 118

## 2020-09-13 MED ORDER — OXYCODONE-ACETAMINOPHEN 5-325 MG PO TABS: 1.0000 | ORAL_TABLET | ORAL | 0 refills | Status: DC | PRN

## 2020-09-13 MED ORDER — CEFAZOLIN SODIUM-DEXTROSE 2-4 GM/100ML-% IV SOLN
2.0000 g | INTRAVENOUS | Status: AC
  Administered 2020-09-13: 2 g via INTRAVENOUS
  Filled 2020-09-13: qty 100

## 2020-09-13 SURGICAL SUPPLY — 80 items
ALCOHOL 70% 16 OZ (MISCELLANEOUS) ×2 IMPLANT
BAG COUNTER SPONGE SURGICOUNT (BAG) ×2 IMPLANT
BEARING HUMERAL SHLDER 36M STD (Shoulder) ×1 IMPLANT
BIT DRILL 2.7 W/STOP DISP (BIT) ×2 IMPLANT
BIT DRILL QUICK REL 1/8 2PK SL (DRILL) ×1 IMPLANT
BIT DRILL TWIST 2.7 (BIT) ×2 IMPLANT
BLADE SAW SGTL 13X75X1.27 (BLADE) ×2 IMPLANT
CHLORAPREP W/TINT 26 (MISCELLANEOUS) ×4 IMPLANT
COMP REV AUG LG W/TAPER/GLENOI (Joint) ×2 IMPLANT
COMPONENT RV AUG LG W/TAPR/GLN (Joint) ×1 IMPLANT
COOLER ICEMAN CLASSIC (MISCELLANEOUS) ×2 IMPLANT
COVER SURGICAL LIGHT HANDLE (MISCELLANEOUS) ×2 IMPLANT
DRAPE INCISE IOBAN 66X45 STRL (DRAPES) ×2 IMPLANT
DRAPE U-SHAPE 47X51 STRL (DRAPES) ×4 IMPLANT
DRILL QUICK RELEASE 1/8 INCH (DRILL) ×1
DRSG AQUACEL AG ADV 3.5X10 (GAUZE/BANDAGES/DRESSINGS) ×2 IMPLANT
ELECT BLADE 4.0 EZ CLEAN MEGAD (MISCELLANEOUS)
ELECT REM PT RETURN 9FT ADLT (ELECTROSURGICAL) ×2
ELECTRODE BLDE 4.0 EZ CLN MEGD (MISCELLANEOUS) IMPLANT
ELECTRODE REM PT RTRN 9FT ADLT (ELECTROSURGICAL) ×1 IMPLANT
GAUZE SPONGE 4X4 12PLY STRL LF (GAUZE/BANDAGES/DRESSINGS) ×2 IMPLANT
GLENOID SPHERE 36MM CVD +3 (Orthopedic Implant) ×2 IMPLANT
GLOVE SRG 8 PF TXTR STRL LF DI (GLOVE) ×1 IMPLANT
GLOVE SURG LTX SZ7 (GLOVE) ×2 IMPLANT
GLOVE SURG LTX SZ8 (GLOVE) ×2 IMPLANT
GLOVE SURG UNDER POLY LF SZ7 (GLOVE) ×2 IMPLANT
GLOVE SURG UNDER POLY LF SZ8 (GLOVE) ×1
GOWN STRL REUS W/ TWL LRG LVL3 (GOWN DISPOSABLE) ×2 IMPLANT
GOWN STRL REUS W/ TWL XL LVL3 (GOWN DISPOSABLE) ×1 IMPLANT
GOWN STRL REUS W/TWL LRG LVL3 (GOWN DISPOSABLE) ×2
GOWN STRL REUS W/TWL XL LVL3 (GOWN DISPOSABLE) ×1
GUIDE MODEL REV SHLD RT (ORTHOPEDIC DISPOSABLE SUPPLIES) ×2 IMPLANT
HYDROGEN PEROXIDE 16OZ (MISCELLANEOUS) ×2 IMPLANT
JET LAVAGE IRRISEPT WOUND (IRRIGATION / IRRIGATOR) ×2
KIT BASIN OR (CUSTOM PROCEDURE TRAY) ×2 IMPLANT
KIT TURNOVER KIT B (KITS) ×2 IMPLANT
LAVAGE JET IRRISEPT WOUND (IRRIGATION / IRRIGATOR) ×1 IMPLANT
LOOP VESSEL MAXI BLUE (MISCELLANEOUS) ×2 IMPLANT
MANIFOLD NEPTUNE II (INSTRUMENTS) ×2 IMPLANT
NDL SUT 6 .5 CRC .975X.05 MAYO (NEEDLE) ×1 IMPLANT
NEEDLE MAYO TAPER (NEEDLE) ×1
NS IRRIG 1000ML POUR BTL (IV SOLUTION) ×2 IMPLANT
PACK SHOULDER (CUSTOM PROCEDURE TRAY) ×2 IMPLANT
PAD ARMBOARD 7.5X6 YLW CONV (MISCELLANEOUS) ×4 IMPLANT
PAD COLD SHLDR WRAP-ON (PAD) ×2 IMPLANT
PIN STEINMANN THREADED TIP (PIN) ×2 IMPLANT
PIN THREADED REVERSE (PIN) ×4 IMPLANT
REAMER GUIDE BUSHING SURG DISP (MISCELLANEOUS) ×2 IMPLANT
REAMER GUIDE W/SCREW AUG (MISCELLANEOUS) ×2 IMPLANT
RESTRAINT HEAD UNIVERSAL NS (MISCELLANEOUS) ×2 IMPLANT
RETRIEVER SUT HEWSON (MISCELLANEOUS) ×2 IMPLANT
SCREW BONE STRL 6.5MMX25MM (Screw) ×2 IMPLANT
SCREW LOCKING 4.75MMX15MM (Screw) ×4 IMPLANT
SCREW LOCKING STRL 4.75X25X3.5 (Screw) ×2 IMPLANT
SHOULDER HUMERAL BEAR 36M STD (Shoulder) ×2 IMPLANT
SLING ARM IMMOBILIZER LRG (SOFTGOODS) ×2 IMPLANT
SOL PREP POV-IOD 4OZ 10% (MISCELLANEOUS) ×2 IMPLANT
SPONGE T-LAP 18X18 ~~LOC~~+RFID (SPONGE) ×6 IMPLANT
STEM HUMERAL STRL 9MMX83MM (Stem) ×2 IMPLANT
STRIP CLOSURE SKIN 1/2X4 (GAUZE/BANDAGES/DRESSINGS) ×2 IMPLANT
SUCTION FRAZIER TIP 8 FR DISP (SUCTIONS) ×1
SUCTION TUBE FRAZIER 8FR DISP (SUCTIONS) ×1 IMPLANT
SUT BROADBAND TAPE 2PK 1.5 (SUTURE) ×4 IMPLANT
SUT FIBERWIRE #2 38 T-5 BLUE (SUTURE) ×4
SUT MNCRL AB 3-0 PS2 18 (SUTURE) ×2 IMPLANT
SUT SILK 2 0 TIES 10X30 (SUTURE) ×2 IMPLANT
SUT VIC AB 0 CT1 27 (SUTURE) ×2
SUT VIC AB 0 CT1 27XBRD ANBCTR (SUTURE) ×2 IMPLANT
SUT VIC AB 1 CT1 27 (SUTURE) ×3
SUT VIC AB 1 CT1 27XBRD ANBCTR (SUTURE) ×3 IMPLANT
SUT VIC AB 1 CT1 36 (SUTURE) ×2 IMPLANT
SUT VIC AB 2-0 CT1 27 (SUTURE) ×3
SUT VIC AB 2-0 CT1 TAPERPNT 27 (SUTURE) ×3 IMPLANT
SUT VICRYL 0 UR6 27IN ABS (SUTURE) ×8 IMPLANT
SUTURE FIBERWR #2 38 T-5 BLUE (SUTURE) ×2 IMPLANT
SYR CONTROL 10ML LL (SYRINGE) ×2 IMPLANT
TOWEL GREEN STERILE (TOWEL DISPOSABLE) ×2 IMPLANT
TOWEL GREEN STERILE FF (TOWEL DISPOSABLE) ×2 IMPLANT
TRAY HUM REV SHOULDER STD +6 (Shoulder) ×2 IMPLANT
WATER STERILE IRR 1000ML POUR (IV SOLUTION) ×2 IMPLANT

## 2020-09-13 NOTE — Anesthesia Postprocedure Evaluation (Signed)
Anesthesia Post Note  Patient: Sherri Gibson  Procedure(s) Performed: RIGHT TOTAL SHOULDER ARTHROPLASTY (Right: Shoulder)     Patient location during evaluation: PACU Anesthesia Type: Regional and General Level of consciousness: awake Pain management: pain level controlled Vital Signs Assessment: post-procedure vital signs reviewed and stable Respiratory status: spontaneous breathing and respiratory function stable Cardiovascular status: stable Postop Assessment: no apparent nausea or vomiting Anesthetic complications: no   No notable events documented.  Last Vitals:  Vitals:   09/13/20 1515 09/13/20 1530  BP: 110/73 109/77  Pulse: 91 98  Resp: 16 19  Temp:    SpO2: 92% 93%    Last Pain:  Vitals:   09/13/20 1515  PainSc: 0-No pain                 Candra R Cleston [place]

## 2020-09-13 NOTE — Progress Notes (Addendum)
Patient presents for right shoulder replacement. Patient states she had lymph nodes removed from her left breast in 2016. Dr. Ambrose Pancoast with anesthesia made aware and per Dr. Ambrose Pancoast it is ok to place peripheral IV in left hand/arm.

## 2020-09-13 NOTE — Brief Op Note (Signed)
   09/13/2020  2:10 PM  PATIENT:  [name]  60 y.o. female  PRE-OPERATIVE DIAGNOSIS:  right shoulder osteoarthritis  POST-OPERATIVE DIAGNOSIS:  right shoulder osteoarthritis  PROCEDURE:  Procedure(s): RIGHT TOTAL SHOULDER ARTHROPLASTY  SURGEON:  Surgeon(s): Marlou Sa, Tonna Corner, MD  ASSISTANT: magnant pa  ANESTHESIA:   general  EBL: 100 ml    Total I/O In: 450 [IV Piggyback:450] Out: 100 [Blood:100]  BLOOD ADMINISTERED: none  DRAINS: none   LOCAL MEDICATIONS USED:  vanco  SPECIMEN:  No Specimen  COUNTS:  YES  TOURNIQUET:  * No tourniquets in log *  DICTATION: .Other Dictation: Dictation Number [id number]  PLAN OF CARE: Discharge to home after PACU  PATIENT DISPOSITION:  PACU - hemodynamically stable

## 2020-09-13 NOTE — Transfer of Care (Signed)
Immediate Anesthesia Transfer of Care Note  Patient: Sherri Gibson  Procedure(s) Performed: RIGHT TOTAL SHOULDER ARTHROPLASTY (Right: Shoulder)  Patient Location: PACU  Anesthesia Type:General  Level of Consciousness: drowsy, patient cooperative and responds to stimulation  Airway & Oxygen Therapy: Patient Spontanous Breathing and Patient connected to nasal cannula oxygen  Post-op Assessment: Report given to RN and Post -op Vital signs reviewed and stable  Post vital signs: Reviewed and stable  Last Vitals:  Vitals Value Taken Time  BP 109/83 09/13/20 1442  Temp    Pulse 93 09/13/20 1447  Resp 19 09/13/20 1447  SpO2 93 % 09/13/20 1447  Vitals shown include unvalidated device data.  Last Pain:  Vitals:   09/13/20 0853  PainSc: 5       Patients Stated Pain Goal: 1 (Q000111Q [id number])  Complications: No notable events documented.

## 2020-09-13 NOTE — Anesthesia Procedure Notes (Signed)
Procedure Name: Intubation Date/Time: 09/13/2020 10:58 AM Performed by: Michele Rockers, CRNA Pre-anesthesia Checklist: Patient identified, Patient being monitored, Timeout performed, Emergency Drugs available and Suction available Patient Re-evaluated:Patient Re-evaluated prior to induction Oxygen Delivery Method: Circle System Utilized Preoxygenation: Pre-oxygenation with 100% oxygen Induction Type: IV induction Ventilation: Mask ventilation without difficulty Laryngoscope Size: Miller and 2 Grade View: Grade I Tube type: Oral Tube size: 7.0 mm Number of attempts: 1 Airway Equipment and Method: Stylet Placement Confirmation: ETT inserted through vocal cords under direct vision, positive ETCO2 and breath sounds checked- equal and bilateral Secured at: 21 cm Tube secured with: Tape Dental Injury: Teeth and Oropharynx as per pre-operative assessment

## 2020-09-13 NOTE — H&P (Signed)
Sherri Gibson is an 60 y.o. female.   Chief Complaint: Right shoulder pain HPI: Sherri Gibson is a 60 year old female with long history of right shoulder pain.  She has known glenohumeral joint arthritis with significant glenoid deformity on CT scan.  She has failed conservative measures.  No prior surgery to the right shoulder.  She is doing well with her left shoulder reverse replacement.  She has limitation of motion as well as limitation of function and pain which interferes with all activities of daily living including sleep.  Past Medical History:  Diagnosis Date   Anemia    as a child   Arthritis    Both Shoulders   Breast cancer ([place]) Q000111Q   Left    Complication of anesthesia    states she had a hard time staying awake after hand surgery   COPD (chronic obstructive pulmonary disease) ([place])    uses O2 at 2l at night   High blood pressure    High cholesterol    History of kidney stones    Personal history of radiation therapy 2016   Left Breast Cancer   Pneumonia    as a child   Pre-diabetes    Prediabetes    Wears dentures    upper dentures    Past Surgical History:  Procedure Laterality Date   BIOPSY  07/11/2020   Procedure: BIOPSY;  Surgeon: Lavena Bullion, DO;  [facility];  Service: Gastroenterology;;   BIOPSY  08/11/2020   Procedure: BIOPSY;  Surgeon: Milus Banister, MD;  [facility];  Service: Endoscopy;;   BREAST BIOPSY     BREAST LUMPECTOMY Left 2021   BREAST LUMPECTOMY WITH AXILLARY LYMPH NODE BIOPSY  2016   COLONOSCOPY  2017   [facility] [place] [facility]. think there was colon polyps   COLONOSCOPY WITH PROPOFOL N/A 07/11/2020   Procedure: COLONOSCOPY WITH PROPOFOL;  Surgeon: Lavena Bullion, DO;  [facility];  Service: Gastroenterology;  Laterality: N/A;   ESOPHAGOGASTRODUODENOSCOPY (EGD) WITH PROPOFOL N/A 07/11/2020   Procedure: ESOPHAGOGASTRODUODENOSCOPY (EGD) WITH PROPOFOL;  Surgeon: Lavena Bullion, DO;  [facility];  Service: Gastroenterology;  Laterality: N/A;   ESOPHAGOGASTRODUODENOSCOPY (EGD) WITH PROPOFOL N/A 08/11/2020   Procedure: ESOPHAGOGASTRODUODENOSCOPY (EGD) WITH PROPOFOL;  Surgeon: Milus Banister, MD;  [facility];  Service: Endoscopy;  Laterality: N/A;   EUS N/A 08/11/2020   Procedure: UPPER ENDOSCOPIC ULTRASOUND (EUS) RADIAL;  Surgeon: Milus Banister, MD;  [facility];  Service: Endoscopy;  Laterality: N/A;   HAND SURGERY Left    PARTIAL HYSTERECTOMY     SAVORY DILATION N/A 07/11/2020   Procedure: SAVORY DILATION;  Surgeon: Lavena Bullion, DO;  [facility];  Service: Gastroenterology;  Laterality: N/A;   TOTAL SHOULDER ARTHROPLASTY Left 04/12/2020   Procedure: LEFT REVERSE TOTAL SHOULDER ARTHROPLASTY;  Surgeon: Meredith Pel, MD;  Location: [place];  Service: Orthopedics;  Laterality: Left;    Family History  Problem Relation Age of Onset   Breast cancer Mother    Diabetes Brother    Colon cancer Neg Hx    Esophageal cancer Neg Hx    Social History:  reports that she quit smoking about 3 years ago. Her smoking use included cigarettes. She has a 110.00 pack-year smoking history. She has never used smokeless tobacco. She reports that she does not currently use alcohol. She reports that she does not use drugs.  Allergies:  Allergies  Allergen Reactions   Codeine  Itching    Feels like something crawling on her skin    Tetanus Toxoids Swelling    Patient stated,"I was given a Pneumonia shot at the same time I got the Tetanus shot, in the same arm."    Medications Prior to Admission  Medication Sig Dispense Refill   albuterol (VENTOLIN HFA) 108 (90 Base) MCG/ACT inhaler USE 2 INHALATIONS EVERY 4 TO 6 HOURS AS NEEDED (Patient taking differently: Inhale 2 puffs into the lungs every 4 (four) hours as needed for wheezing or shortness of breath.) 25.5 g 3   amLODipine (NORVASC) 10 MG tablet TAKE 1 TABLET DAILY  (Patient taking differently: Take 10 mg by mouth every evening.) 90 tablet 3   aspirin EC 81 MG tablet Take 81 mg by mouth every evening. Swallow whole.     atorvastatin (LIPITOR) 40 MG tablet TAKE 1 TABLET DAILY (DISCONTINUE PRAVASTATIN) (Patient taking differently: Take 40 mg by mouth every evening.) 90 tablet 3   Calcium Carb-Cholecalciferol (CALCIUM 600-D PO) Take 1 tablet by mouth in the morning and at bedtime.     cyclobenzaprine (FLEXERIL) 10 MG tablet TAKE 1 TABLET THREE TIMES A DAY AS NEEDED FOR MUSCLE SPASMS (Patient taking differently: Take 10 mg by mouth 3 (three) times daily as needed for muscle spasms.) 90 tablet 3   losartan (COZAAR) 50 MG tablet TAKE 1 TABLET DAILY (Patient taking differently: Take 50 mg by mouth at bedtime.) 90 tablet 3   metFORMIN (GLUCOPHAGE) 500 MG tablet TAKE 1 TABLET DAILY WITH BREAKFAST (NEED A DIABETES MELLITUS CHECK) (Patient taking differently: Take 500 mg by mouth daily with breakfast.) 90 tablet 3   montelukast (SINGULAIR) 10 MG tablet TAKE 1 TABLET AT BEDTIME (Patient taking differently: Take 10 mg by mouth at bedtime.) 90 tablet 3   OXYGEN Inhale 2 L into the lungs at bedtime.     pantoprazole (PROTONIX) 40 MG tablet Take 1 tablet (40 mg total) by mouth 2 (two) times daily. 180 tablet 3   sodium chloride (OCEAN) 0.65 % SOLN nasal spray Place 1 spray into both nostrils daily as needed (Dry nose).     SPIRIVA RESPIMAT 2.5 MCG/ACT AERS USE 2 INHALATIONS DAILY (Patient taking differently: Inhale 2 puffs into the lungs in the morning.) 12 g 3   SYMBICORT 160-4.5 MCG/ACT inhaler USE 2 INHALATIONS TWICE A DAY (Patient taking differently: Inhale 2 puffs into the lungs 2 (two) times daily.) 30.6 g 3   traMADol (ULTRAM) 50 MG tablet TAKE 1-2 TABLETS BY MOUTH EVERY 8 HOURS AS NEEDED FOR MODERATE PAIN. MAX 6 TAB/DAY FOR 30 DAYS (Patient taking differently: Take 100 mg by mouth every evening.) 90 tablet 0   traZODone (DESYREL) 50 MG tablet Take 50 mg by mouth at  bedtime.     loratadine (CLARITIN) 10 MG tablet Take 10 mg by mouth every evening.     Multiple Vitamins-Minerals (MULTIVITAMIN WITH MINERALS) tablet Take 1 tablet by mouth daily.     predniSONE (STERAPRED UNI-PAK 21 TAB) 10 MG (21) TBPK tablet Taper as directed on packaging (Patient not taking: Reported on 08/30/2020) 21 tablet 0    Results for orders placed or performed during the hospital encounter of 09/13/20 (from the past 48 hour(s))  Glucose, capillary     Status: Abnormal   Collection Time: 09/13/20  8:46 AM  Result Value Ref Range   Glucose-Capillary 115 (H) 70 - 99 mg/dL    Comment: Glucose reference range applies only to samples taken after fasting for at least 8  hours.   Comment 1 Notify RN    Comment 2 Repeat Test    Comment 3 Call MD NNP PA CNM    No results found.  Review of Systems  Musculoskeletal:  Positive for arthralgias.  All other systems reviewed and are negative.  Blood pressure 128/74, pulse 87, temperature 97.8 F (36.6 C), resp. rate 19, height '4\' 11"'$  (1.499 m), weight 54 kg, SpO2 96 %. Physical Exam Vitals reviewed.  HENT:     Head: Normocephalic.     Nose: Nose normal.     Mouth/Throat:     Mouth: Mucous membranes are moist.  Eyes:     Pupils: Pupils are equal, round, and reactive to light.  Cardiovascular:     Rate and Rhythm: Normal rate.     Pulses: Normal pulses.  Pulmonary:     Effort: Pulmonary effort is normal.  Abdominal:     General: Abdomen is flat.  Musculoskeletal:     Cervical back: Normal range of motion.  Skin:    General: Skin is warm.     Capillary Refill: Capillary refill takes less than 2 seconds.  Neurological:     General: No focal deficit present.     Mental Status: She is alert.  Psychiatric:        Mood and Affect: Mood normal.  Examination of the right shoulder demonstrates functional deltoid.  Passive range of motion is 20/60/100.  Rotator cuff strength feels reasonable.  Coarse grinding and crepitus is present  within the right shoulder region.  No AC joint tenderness.  Motor sensory function intact to the hand.  Assessment/Plan Impression is right shoulder arthritis with over 20 degrees of glenoid deformity.  Based on her excellent result with the left shoulder reverse shoulder replacement plan at this time based on deformity is right reverse shoulder replacement.  The risk and benefits are discussed with the patient include not limited to infection nerve vessel damage incomplete pain relief dislocation as well as potential need for revision in her lifetime.  Lifetime lifting restrictions also discussed which also apply to her left shoulder.  Patient understands risk and benefits.  All questions answered.  Anderson Malta, MD 09/13/2020, 10:31 AM

## 2020-09-13 NOTE — Anesthesia Procedure Notes (Addendum)
Anesthesia Regional Block: Interscalene brachial plexus block   Pre-Anesthetic Checklist: , timeout performed,  Correct Patient, Correct Site, Correct Laterality,  Correct Procedure, Correct Position, site marked,  Risks and benefits discussed,  Surgical consent,  Pre-op evaluation,  At surgeon's request and post-op pain management  Laterality: Right  Prep: chloraprep       Needles:  Injection technique: Single-shot  Needle Type: Echogenic Stimulator Needle     Needle Length: 10cm  Needle Gauge: 20     Additional Needles:   Procedures:,,,, ultrasound used (permanent image in chart),,    Narrative:  Start time: 09/13/2020 9:35 AM End time: 09/13/2020 9:40 AM Injection made incrementally with aspirations every 5 mL.  Performed by: Personally  Anesthesiologist: Merlinda Frederick, MD  Additional Notes: Standard monitors applied. Skin prepped. Good needle visualization with ultrasound. Injection made in 5cc increments with no resistance to injection. Patient tolerated the procedure well.

## 2020-09-14 ENCOUNTER — Encounter (HOSPITAL_COMMUNITY): Payer: Self-pay | Admitting: Orthopedic Surgery

## 2020-09-14 LAB — GLUCOSE, CAPILLARY: Glucose-Capillary: 163 mg/dL — ABNORMAL HIGH (ref 70–99)

## 2020-09-14 NOTE — Op Note (Signed)
NAME: ADRIHANNA, KARON MEDICAL RECORD NO: [id number] ACCOUNT NO: [id number] DATE OF BIRTH: 05-16-1960 FACILITY: [facility] LOCATION: [facility] PHYSICIAN: Yetta Barre. Marlou Sa, MD  Operative Report   DATE OF PROCEDURE: 09/13/2020  PREOPERATIVE DIAGNOSIS:  Right shoulder arthritis.  POSTOPERATIVE DIAGNOSIS:  Right shoulder arthritis.  PROCEDURE:  Right reverse shoulder replacement using [employer] components including mini humeral stem size 9, mini humeral tray +6 taper offset 40 mm diameter with 36 mm standard bearing, and comprehensive reverse shoulder large augmented baseplate  with one central compression screw 6.5 x 25 and 3 peripheral fix locking screws with 36 standard +3 offset glenosphere.  SURGEON ATTENDING:  Meredith Pel, MD  ASSISTANT:  Annie Main  INDICATIONS:  The patient is a 60 year old female with right shoulder pain, who presents for operative management after explanation of risks and benefits after failure of conservative management.  She has end-stage glenohumeral arthritis with significant  glenoid deformity of negative 25 degrees version.  DESCRIPTION OF PROCEDURE:  The patient was brought to the operating room where general anesthetic was induced.  Preoperative antibiotics were administered.  Timeout was called.  The patient was placed in the beach chair position with the head in neutral  position.  Right shoulder, arm, and hand were then prescrubbed with hydrogen peroxide followed by alcohol and Betadine, which was allowed to air dry, then prepped with ChloraPrep solution and draped in a sterile manner.  Charlie Pitter was used to cover the  operative field.  A timeout was called.  Deltopectoral approach was made.  Skin and subcutaneous tissue were sharply divided.  Cephalic vein mobilized medially.  The upper 1.5 cm of the pec was released.  Next, biceps tendon was tenodesed to the  remaining pec tendon with 4-0 Vicryl sutures.  Rotator interval was then opened on top of  the remaining biceps tendon.  At this time, circumflex vessels were ligated.  The axillary nerve was identified, and a vessel loop was placed around it, and it was  protected at all times during the case.  Next, the subdeltoid adhesions were released.  Subacromial adhesions were released.  At this time, the subscap was detached from the lesser tuberosity using a scalpel.  Two 0 Vicryl stay sutures were placed.  The  capsule was then released about 2 cm off the inferior humeral neck bluntly.  This was done around to the 5 o'clock position on the humeral head.  Rotator cuff looked attenuated superiorly, but was intact.  Next, the head was dislocated.  Proximal reaming  was performed up to a size 9.  The head was then cut 30 degrees of retroversion, which matched her native version.  Broaching was then performed up to size 9, which gave a very solid fit.  Cap was placed.  Attention was directed towards the glenoid.   Rotator interval released along with coracohumeral ligament down to the base of the coracoid.  Gweneth Dimitri was mobilized as well.  Circumferential excision of the labrum was performed with care being taken to avoid injury to the axillary nerve.  Using  patient-specific instrumentation, a drill bit was placed into the glenoid and that was reamed.  A large augment was required.  The reaming for the large augment was also performed, and we achieved nice bleeding bony surface throughout.  The trial fit  nicely.  True glenosphere then placed with central compression screw achieving very good fixation.  Three peripheral locking screws were placed.  Anteriorly not enough bone, which was expected from the plan.  Next, a trial was performed with both the  standard and the +3 offset glenoid along with a standard and +6 offset humeral tray.  The +3 offset gave optimal stability without over tensioning.  The +3 glenosphere was then placed with excellent Morse taper fit obtained.  Attention was then directed   towards the humerus.  The trial humerus was removed.  Four SutureTapes were placed for subscap repair.  True stem was placed.  Excellent stability parameters were maintained with the +6 offset tray with standard bearing.  This was the one that was placed  and the patient had excellent stability with extension, abduction, forward flexion, internal and external rotation.  Overall, a very stable construct, which was "two fingers tight."  It is hard to dislocate, hard to relocate.  Following this, thorough  irrigation was performed with the irrigating solution.  IrriSept utilized after the incision as well as all times during the case.  Subscap was then repaired with the arm externally rotated about 30 degrees.  Had about 60 degrees of external rotation  after subscap repair was performed. Rotator interval was closed after irrigating the joint with the IrriSept solution, placing vancomycin powder into the joint.  Rotator interval was then closed with #1 Vicryl suture again in 30 degrees of external  rotation.  Next, the vessel loop was removed from the axillary nerve, which was in good position and had positive tug test.  Next, thorough irrigation was performed again with pouring irrigation followed by IrriSept solution, vancomycin powder and  closure over the cephalic vein using #1 Vicryl suture followed by interrupted inverted 0 Vicryl suture, 2-0 Vicryl suture, and 3-0 Monocryl with Steri-Strips applied and Aquacel dressing.  Shoulder immobilizer was placed.  Luke's assistance was required at all times during the procedure for retraction, opening, closing, mobilization of tissue.  His assistance was a medical necessity.   ROH D: 09/13/2020 2:11:18 pm T: 09/13/2020 4:03:00 pm  JOB: [id number]

## 2020-09-14 NOTE — Addendum Note (Signed)
Addendum  created 09/14/20 1330 by Merlinda Frederick, MD   Clinical Note Signed, Intraprocedure Blocks edited, SmartForm saved

## 2020-09-15 DIAGNOSIS — M19011 Primary osteoarthritis, right shoulder: Secondary | ICD-10-CM | POA: Diagnosis not present

## 2020-09-16 ENCOUNTER — Encounter: Payer: Self-pay | Admitting: Orthopedic Surgery

## 2020-09-21 ENCOUNTER — Encounter: Payer: Self-pay | Admitting: Osteopathic Medicine

## 2020-09-21 ENCOUNTER — Telehealth (INDEPENDENT_AMBULATORY_CARE_PROVIDER_SITE_OTHER): Payer: BC Managed Care – PPO | Admitting: Osteopathic Medicine

## 2020-09-21 VITALS — BP 124/62 | Wt 118.0 lb

## 2020-09-21 DIAGNOSIS — J449 Chronic obstructive pulmonary disease, unspecified: Secondary | ICD-10-CM

## 2020-09-21 DIAGNOSIS — J441 Chronic obstructive pulmonary disease with (acute) exacerbation: Secondary | ICD-10-CM | POA: Diagnosis not present

## 2020-09-21 MED ORDER — GUAIFENESIN-DM 100-10 MG/5ML PO SYRP: 10.0000 mL | ORAL_SOLUTION | ORAL | 0 refills | Status: DC | PRN

## 2020-09-21 MED ORDER — AZITHROMYCIN 250 MG PO TABS: ORAL_TABLET | ORAL | 0 refills | Status: AC

## 2020-09-21 MED ORDER — PREDNISONE 20 MG PO TABS: 20.0000 mg | ORAL_TABLET | Freq: Two times a day (BID) | ORAL | 0 refills | Status: DC

## 2020-09-21 NOTE — Progress Notes (Signed)
Telemedicine Visit via  Audio only - telephone (patient preference /  technical difficulty with MyChart video application)  I connected with Sherri Gibson on 09/21/20 at 9:03 AM  by phone or  telemedicine application as noted above  I verified that I am speaking with or regarding  the correct patient using two identifiers.  Participants: Myself, Dr Emeterio Reeve DO Patient: Sherri Gibson Patient proxy if applicable: none Other, if applicable: none  Patient is at home I am in office at [facility]    I discussed the limitations of evaluation and management  by telemedicine and the availability of in person appointments.  The participant(s) above expressed understanding and  agreed to proceed with this appointment via telemedicine.       History of Present Illness: Sherri Gibson is a 60 y.o. female who would like to discuss cough. Recent orthopedic surgery and recovering from that, feeling really run down. Goes out in the morning to feed the animals and having a hard time breathing on exertion. Feeling SOB x3-4 days, no CP, feels congested in her chest and better w/ productive cough. Occasionally whitish mucus, occasionally brownish mucus. Not sleeping well d/t pain. No fever, no body aches.       Observations/Objective: BP 124/62   Wt 118 lb (53.5 kg)   BMI 23.83 kg/m  BP Readings from Last 3 Encounters:  09/21/20 124/62  09/13/20 109/77  09/05/20 113/82   Exam: Normal Speech.  NAD  Lab and Radiology Results No results found for this or any previous visit (from the past 72 hour(s)). No results found.     Assessment and Plan: 60 y.o. female with The primary encounter diagnosis was COPD, severe ([place]). A diagnosis of COPD exacerbation ([place]) was also pertinent to this visit.  Increase albuterol use to every 2-4 hours while awake Continue maintenance inhalers  Sent Rx for cough and steroids Sent rx for abx, advised pt do not take  this for 2-3 days and only take if steroids/etc are not helping, call us if otherwise worse of change Can see me in office if needed - ok to double book   PDMP not reviewed this encounter. No orders of the defined types were placed in this encounter.  Meds ordered this encounter  Medications   predniSONE (DELTASONE) 20 MG tablet    Sig: Take 1 tablet (20 mg total) by mouth 2 (two) times daily with a meal.    Dispense:  10 tablet    Refill:  0   guaiFENesin-dextromethorphan (ROBITUSSIN DM) 100-10 MG/5ML syrup    Sig: Take 10 mLs by mouth every 4 (four) hours as needed for cough.    Dispense:  236 mL    Refill:  0   azithromycin (ZITHROMAX) 250 MG tablet    Sig: Take 2 tablets on day 1, then 1 tablet daily on days 2 through 5    Dispense:  6 tablet    Refill:  0   There are no Patient Instructions on file for this visit.  Instructions sent via MyChart.   Follow Up Instructions: No follow-ups on file.    I discussed the assessment and treatment plan with the patient. The patient was provided an opportunity to ask questions and all were answered. The patient agreed with the plan and demonstrated an understanding of the instructions.   The patient was advised to call back or seek an in-person evaluation if any new concerns, if symptoms worsen or if the condition fails  to improve as anticipated.  21 minutes of non-face-to-face time was provided during this encounter.      . . . . . . . . . . . . . Marland Kitchen                   Historical information moved to improve visibility of documentation.  Past Medical History:  Diagnosis Date   Anemia    as a child   Arthritis    Both Shoulders   Breast cancer ([place]) Q000111Q   Left    Complication of anesthesia    states she had a hard time staying awake after hand surgery   COPD (chronic obstructive pulmonary disease) ([place])    uses O2 at 2l at night   High blood pressure    High cholesterol    History of kidney  stones    Personal history of radiation therapy 2016   Left Breast Cancer   Pneumonia    as a child   Pre-diabetes    Prediabetes    Wears dentures    upper dentures   Past Surgical History:  Procedure Laterality Date   BIOPSY  07/11/2020   Procedure: BIOPSY;  Surgeon: Lavena Bullion, DO;  Location: [facility];  Service: Gastroenterology;;   BIOPSY  08/11/2020   Procedure: BIOPSY;  Surgeon: Milus Banister, MD;  [facility];  Service: Endoscopy;;   BREAST BIOPSY     BREAST LUMPECTOMY Left 2021   BREAST LUMPECTOMY WITH AXILLARY LYMPH NODE BIOPSY  2016   COLONOSCOPY  2017   [facility]. think there was colon polyps   COLONOSCOPY WITH PROPOFOL N/A 07/11/2020   Procedure: COLONOSCOPY WITH PROPOFOL;  Surgeon: Lavena Bullion, DO;  [facility];  Service: Gastroenterology;  Laterality: N/A;   ESOPHAGOGASTRODUODENOSCOPY (EGD) WITH PROPOFOL N/A 07/11/2020   Procedure: ESOPHAGOGASTRODUODENOSCOPY (EGD) WITH PROPOFOL;  Surgeon: Lavena Bullion, DO;  [facility];  Service: Gastroenterology;  Laterality: N/A;   ESOPHAGOGASTRODUODENOSCOPY (EGD) WITH PROPOFOL N/A 08/11/2020   Procedure: ESOPHAGOGASTRODUODENOSCOPY (EGD) WITH PROPOFOL;  Surgeon: Milus Banister, MD;  [facility];  Service: Endoscopy;  Laterality: N/A;   EUS N/A 08/11/2020   Procedure: UPPER ENDOSCOPIC ULTRASOUND (EUS) RADIAL;  Surgeon: Milus Banister, MD;  [facility];  Service: Endoscopy;  Laterality: N/A;   HAND SURGERY Left    PARTIAL HYSTERECTOMY     SAVORY DILATION N/A 07/11/2020   Procedure: SAVORY DILATION;  Surgeon: Lavena Bullion, DO;  [facility];  Service: Gastroenterology;  Laterality: N/A;   TOTAL SHOULDER ARTHROPLASTY Left 04/12/2020   Procedure: LEFT REVERSE TOTAL SHOULDER ARTHROPLASTY;  Surgeon: Meredith Pel, MD;  Location: [place];  Service: Orthopedics;  Laterality: Left;   TOTAL SHOULDER  ARTHROPLASTY Right 09/13/2020   Procedure: RIGHT TOTAL SHOULDER ARTHROPLASTY;  Surgeon: Meredith Pel, MD;  Location: [place];  Service: Orthopedics;  Laterality: Right;   Social History   Tobacco Use   Smoking status: Former    Packs/day: 2.50    Years: 44.00    Pack years: 110.00    Types: Cigarettes    Quit date: 11/29/2016    Years since quitting: 3.8   Smokeless tobacco: Never  Substance Use Topics   Alcohol use: Not Currently   family history includes Breast cancer in her mother; Diabetes in her brother.  Medications: Current Outpatient Medications  Medication Sig Dispense Refill   albuterol (VENTOLIN HFA) 108 (90 Base) MCG/ACT inhaler  USE 2 INHALATIONS EVERY 4 TO 6 HOURS AS NEEDED (Patient taking differently: Inhale 2 puffs into the lungs every 4 (four) hours as needed for wheezing or shortness of breath.) 25.5 g 3   amLODipine (NORVASC) 10 MG tablet TAKE 1 TABLET DAILY (Patient taking differently: Take 10 mg by mouth every evening.) 90 tablet 3   aspirin EC 81 MG tablet Take 81 mg by mouth every evening. Swallow whole.     atorvastatin (LIPITOR) 40 MG tablet TAKE 1 TABLET DAILY (DISCONTINUE PRAVASTATIN) (Patient taking differently: Take 40 mg by mouth every evening.) 90 tablet 3   azithromycin (ZITHROMAX) 250 MG tablet Take 2 tablets on day 1, then 1 tablet daily on days 2 through 5 6 tablet 0   Calcium Carb-Cholecalciferol (CALCIUM 600-D PO) Take 1 tablet by mouth in the morning and at bedtime.     cyclobenzaprine (FLEXERIL) 10 MG tablet TAKE 1 TABLET THREE TIMES A DAY AS NEEDED FOR MUSCLE SPASMS (Patient taking differently: Take 10 mg by mouth 3 (three) times daily as needed for muscle spasms.) 90 tablet 3   gabapentin (NEURONTIN) 300 MG capsule Take 1 capsule (300 mg total) by mouth 3 (three) times daily. 30 capsule 0   guaiFENesin-dextromethorphan (ROBITUSSIN DM) 100-10 MG/5ML syrup Take 10 mLs by mouth every 4 (four) hours as needed for cough. 236 mL 0   loratadine  (CLARITIN) 10 MG tablet Take 10 mg by mouth every evening.     losartan (COZAAR) 50 MG tablet TAKE 1 TABLET DAILY (Patient taking differently: Take 50 mg by mouth at bedtime.) 90 tablet 3   metFORMIN (GLUCOPHAGE) 500 MG tablet TAKE 1 TABLET DAILY WITH BREAKFAST (NEED A DIABETES MELLITUS CHECK) (Patient taking differently: Take 500 mg by mouth daily with breakfast.) 90 tablet 3   montelukast (SINGULAIR) 10 MG tablet TAKE 1 TABLET AT BEDTIME (Patient taking differently: Take 10 mg by mouth at bedtime.) 90 tablet 3   Multiple Vitamins-Minerals (MULTIVITAMIN WITH MINERALS) tablet Take 1 tablet by mouth daily.     oxyCODONE-acetaminophen (PERCOCET) 5-325 MG tablet Take 1 tablet by mouth every 4 (four) hours as needed for severe pain. 30 tablet 0   OXYGEN Inhale 2 L into the lungs at bedtime.     pantoprazole (PROTONIX) 40 MG tablet Take 1 tablet (40 mg total) by mouth 2 (two) times daily. 180 tablet 3   predniSONE (DELTASONE) 20 MG tablet Take 1 tablet (20 mg total) by mouth 2 (two) times daily with a meal. 10 tablet 0   sodium chloride (OCEAN) 0.65 % SOLN nasal spray Place 1 spray into both nostrils daily as needed (Dry nose).     SPIRIVA RESPIMAT 2.5 MCG/ACT AERS USE 2 INHALATIONS DAILY (Patient taking differently: Inhale 2 puffs into the lungs in the morning.) 12 g 3   SYMBICORT 160-4.5 MCG/ACT inhaler USE 2 INHALATIONS TWICE A DAY (Patient taking differently: Inhale 2 puffs into the lungs 2 (two) times daily.) 30.6 g 3   traZODone (DESYREL) 50 MG tablet Take 50 mg by mouth at bedtime.     No current facility-administered medications for this visit.   Allergies  Allergen Reactions   Codeine Itching    Feels like something crawling on her skin    Tetanus Toxoids Swelling    Patient stated,"I was given a Pneumonia shot at the same time I got the Tetanus shot, in the same arm."     If phone visit, billing and coding can please add appropriate modifier if needed

## 2020-09-24 DIAGNOSIS — M19011 Primary osteoarthritis, right shoulder: Secondary | ICD-10-CM

## 2020-09-25 DIAGNOSIS — J449 Chronic obstructive pulmonary disease, unspecified: Secondary | ICD-10-CM | POA: Diagnosis not present

## 2020-09-28 ENCOUNTER — Ambulatory Visit (INDEPENDENT_AMBULATORY_CARE_PROVIDER_SITE_OTHER): Payer: BC Managed Care – PPO

## 2020-09-28 ENCOUNTER — Ambulatory Visit (INDEPENDENT_AMBULATORY_CARE_PROVIDER_SITE_OTHER): Payer: BC Managed Care – PPO | Admitting: Orthopedic Surgery

## 2020-09-28 ENCOUNTER — Other Ambulatory Visit: Payer: Self-pay

## 2020-09-28 DIAGNOSIS — Z96611 Presence of right artificial shoulder joint: Secondary | ICD-10-CM

## 2020-09-28 DIAGNOSIS — M25532 Pain in left wrist: Secondary | ICD-10-CM

## 2020-10-03 ENCOUNTER — Encounter: Payer: Self-pay | Admitting: Orthopedic Surgery

## 2020-10-03 NOTE — Progress Notes (Signed)
Post-Op Visit Note   Patient: Sherri Gibson           Date of Birth: 1960-09-01           MRN: [id number] Visit Date: 09/28/2020 PCP: Emeterio Reeve, DO   Assessment & Plan:  Chief Complaint:  Chief Complaint  Patient presents with   Right Shoulder - Routine Post Op    Right TSA 09/13/20   Visit Diagnoses:  1. History of arthroplasty of right shoulder   2. Pain in left wrist     Plan: Sherri Gibson is a 60 year old patient who underwent right reverse shoulder replacement 09/13/2020.  CPM is at 100.  On exam deltoid fires.  Passive range of motion is improving.  Plan at this time is physical therapy in [facility] 2 times a week for 4 weeks for right shoulder passive range of motion and active assisted range of motion status post reverse shoulder replacement.  Okay to discontinue the sling but no lifting with that right arm for 4 weeks.  Refer to [facility] to evaluate left wrist for scapholunate ligament injury and FCR tendon tear.  Follow-Up Instructions: Return in about 4 weeks (around 10/26/2020).   Orders:  Orders Placed This Encounter  Procedures   XR Shoulder Right   Ambulatory referral to Physical Therapy   Ambulatory referral to Orthopedic Surgery   No orders of the defined types were placed in this encounter.   Imaging: No results found.  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   Encounter for screening for lung cancer 04/13/2019   Primary osteoarthritis of both shoulders 03/06/2019   Chronic hypoxemic respiratory failure ([place]) 12/22/2018   Osteopenia 12/20/2017    Prediabetes 09/23/2017   Colonoscopy refused 05/29/2017   COPD, severe ([place]) 05/29/2017   Multiple pulmonary nodules 05/29/2017   Trigger thumb, right thumb 05/29/2017   Primary osteoarthritis of first carpometacarpal joint of left hand 05/29/2017   Kidney lesion 12/14/2016   Erythrocytosis 11/13/2016   Breast cancer ([place]) 09/20/2016   Essential hypertension 09/20/2016   Insomnia 09/20/2016   Mixed hyperlipidemia 09/20/2016   Past Medical History:  Diagnosis Date   Anemia    as a child   Arthritis    Both Shoulders   Breast cancer ([place]) Q000111Q   Left    Complication of anesthesia    states she had a hard time staying awake after hand surgery   COPD (chronic obstructive pulmonary disease) ([place])    uses O2 at 2l at night   High blood pressure    High cholesterol    History of kidney stones    Personal history of radiation therapy 2016   Left Breast Cancer   Pneumonia    as a child   Pre-diabetes    Prediabetes    Wears dentures    upper dentures    Family History  Problem Relation Age of Onset   Breast cancer Mother    Diabetes Brother    Colon cancer Neg Hx    Esophageal cancer Neg Hx     Past Surgical History:  Procedure  Laterality Date   BIOPSY  07/11/2020   Procedure: BIOPSY;  Surgeon: Lavena Bullion, DO;  [facility];  Service: Gastroenterology;;   BIOPSY  08/11/2020   Procedure: BIOPSY;  Surgeon: Milus Banister, MD;  [facility];  Service: Endoscopy;;   BREAST BIOPSY     BREAST LUMPECTOMY Left 2021   BREAST LUMPECTOMY WITH AXILLARY LYMPH NODE BIOPSY  2016   COLONOSCOPY  2017   [facility] [place] [facility]. think there was colon polyps   COLONOSCOPY WITH PROPOFOL N/A 07/11/2020   Procedure: COLONOSCOPY WITH PROPOFOL;  Surgeon: Lavena Bullion, DO;  [facility];  Service: Gastroenterology;  Laterality: N/A;   ESOPHAGOGASTRODUODENOSCOPY (EGD) WITH PROPOFOL N/A 07/11/2020   Procedure:  ESOPHAGOGASTRODUODENOSCOPY (EGD) WITH PROPOFOL;  Surgeon: Lavena Bullion, DO;  [facility];  Service: Gastroenterology;  Laterality: N/A;   ESOPHAGOGASTRODUODENOSCOPY (EGD) WITH PROPOFOL N/A 08/11/2020   Procedure: ESOPHAGOGASTRODUODENOSCOPY (EGD) WITH PROPOFOL;  Surgeon: Milus Banister, MD;  [facility];  Service: Endoscopy;  Laterality: N/A;   EUS N/A 08/11/2020   Procedure: UPPER ENDOSCOPIC ULTRASOUND (EUS) RADIAL;  Surgeon: Milus Banister, MD;  [facility];  Service: Endoscopy;  Laterality: N/A;   HAND SURGERY Left    PARTIAL HYSTERECTOMY     SAVORY DILATION N/A 07/11/2020   Procedure: SAVORY DILATION;  Surgeon: Lavena Bullion, DO;  [facility];  Service: Gastroenterology;  Laterality: N/A;   TOTAL SHOULDER ARTHROPLASTY Left 04/12/2020   Procedure: LEFT REVERSE TOTAL SHOULDER ARTHROPLASTY;  Surgeon: Meredith Pel, MD;  Location: [place];  Service: Orthopedics;  Laterality: Left;   TOTAL SHOULDER ARTHROPLASTY Right 09/13/2020   Procedure: RIGHT TOTAL SHOULDER ARTHROPLASTY;  Surgeon: Meredith Pel, MD;  Location: [place];  Service: Orthopedics;  Laterality: Right;   Social History   Occupational History   Not on file  Tobacco Use   Smoking status: Former    Packs/day: 2.50    Years: 44.00    Pack years: 110.00    Types: Cigarettes    Quit date: 11/29/2016    Years since quitting: 3.8   Smokeless tobacco: Never  Vaping Use   Vaping Use: Never used  Substance and Sexual Activity   Alcohol use: Not Currently   Drug use: Never   Sexual activity: Yes    Partners: Male    Birth control/protection: None

## 2020-10-04 ENCOUNTER — Ambulatory Visit (INDEPENDENT_AMBULATORY_CARE_PROVIDER_SITE_OTHER): Payer: BC Managed Care – PPO

## 2020-10-04 ENCOUNTER — Ambulatory Visit: Payer: BC Managed Care – PPO | Admitting: Osteopathic Medicine

## 2020-10-04 ENCOUNTER — Encounter: Payer: Self-pay | Admitting: Osteopathic Medicine

## 2020-10-04 ENCOUNTER — Other Ambulatory Visit: Payer: Self-pay

## 2020-10-04 VITALS — BP 108/72 | HR 95 | Temp 98.0°F | Wt 118.0 lb

## 2020-10-04 DIAGNOSIS — Z23 Encounter for immunization: Secondary | ICD-10-CM

## 2020-10-04 DIAGNOSIS — E1169 Type 2 diabetes mellitus with other specified complication: Secondary | ICD-10-CM | POA: Diagnosis not present

## 2020-10-04 DIAGNOSIS — J410 Simple chronic bronchitis: Secondary | ICD-10-CM | POA: Diagnosis not present

## 2020-10-04 DIAGNOSIS — R059 Cough, unspecified: Secondary | ICD-10-CM

## 2020-10-04 DIAGNOSIS — J449 Chronic obstructive pulmonary disease, unspecified: Secondary | ICD-10-CM | POA: Diagnosis not present

## 2020-10-04 MED ORDER — NEBULIZER AIR TUBE/PLUGS MISC: 99 refills | Status: DC

## 2020-10-04 MED ORDER — NEBULIZER DEVI: 99 refills | Status: AC

## 2020-10-04 MED ORDER — METFORMIN HCL 500 MG PO TABS: 1000.0000 mg | ORAL_TABLET | Freq: Every day | ORAL | 3 refills | Status: DC

## 2020-10-04 MED ORDER — TRAZODONE HCL 50 MG PO TABS: 50.0000 mg | ORAL_TABLET | Freq: Every day | ORAL | Status: DC

## 2020-10-04 MED ORDER — ALBUTEROL SULFATE (2.5 MG/3ML) 0.083% IN NEBU: 2.5000 mg | INHALATION_SOLUTION | Freq: Four times a day (QID) | RESPIRATORY_TRACT | 99 refills | Status: DC | PRN

## 2020-10-04 NOTE — Progress Notes (Signed)
Sherri Gibson is a 60 y.o. female who presents to  [facility]  today, 10/04/20, seeking care for the following:  Follow up diabetes Current meds: 500 mg metformin daily  A1C: 12/02/2019: 6.7 03/03/2020: 5.9 06/01/2020: 6.1 09/05/2020: 7.0  Today, pt reports doing overall ok but eating fewer healthy meals and snacking a lot more - she isn't cooking as much given her husband's health issues he has very low appetitie.    Follow up unintentional weight loss Pt with a history of breast cancer (2016) and COPD has lost about 15 lbs over the last 1.5 years. 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 Pt has been established with GI, colonoscopy upcoming. CT chest done 05/16/20 for lung cancer screening. WEIGHT HAS BEEN STABLE as below    Wt Readings from Last 10 Encounters:  10/04/20 118 lb 0.6 oz (53.5 kg)  09/21/20 118 lb (53.5 kg)  09/13/20 119 lb (54 kg)  09/05/20 118 lb 4.8 oz (53.7 kg)  08/12/20 121 lb (54.9 kg)  08/04/20 118 lb (53.5 kg)  07/07/20 119 lb (54 kg)  06/01/20 118 lb 1.9 oz (53.6 kg)  04/14/20 120 lb (54.4 kg)  04/12/20 120 lb (54.4 kg)     Coughing Reports nagging cough ongoing for a month or so, no fever or new SOB, no new DOE    Insomnia Trazodone not heling as much, she wakes up a lot w/ husbands poor sleeping since he's been sick.    ASSESSMENT & PLAN with other pertinent findings:  The primary encounter diagnosis was Controlled type 2 diabetes mellitus with other specified complication, without long-term current use of insulin ([place]). Diagnoses of Need for influenza vaccination and Simple chronic bronchitis ([place]) were also pertinent to this visit.    Patient Instructions  A1C up to 7.0 --> increase metformin from 500 mg daily to 1000 mg daily, recheck A1C in 3 mos  Lungs sound raspy at the bases,  let's get X-day today and new nebulizer machine!   Trazodone increase from 50 --> 100 --> 150 mg, will check in via MyChart in 2 weeks     Orders Placed This Encounter  Procedures   DG Chest 2 View   Flu Vaccine QUAD 6+ mos PF IM (Fluarix Quad PF)    Meds ordered this encounter  Medications   Respiratory Therapy Supplies (NEBULIZER) DEVI    Sig: As directed for Dx COPD    Dispense:  1 each    Refill:  99   Respiratory Therapy Supplies (NEBULIZER AIR TUBE/PLUGS) MISC    Sig: As directed w/ nebulizer for Dx COPD    Dispense:  1 each    Refill:  99   albuterol (PROVENTIL) (2.5 MG/3ML) 0.083% nebulizer solution    Sig: Take 3 mLs (2.5 mg total) by nebulization every 6 (six) hours as needed for wheezing or shortness of breath.    Dispense:  50 mL    Refill:  99   traZODone (DESYREL) 50 MG tablet    Sig: Take 1-3 tablets (50-150 mg total) by mouth at bedtime.   DISCONTD: metFORMIN (GLUCOPHAGE) 500 MG tablet    Sig: Take 2 tablets (1,000 mg total) by mouth daily.    Dispense:  180 tablet    Refill:  3    CANCEL ONCE DAILY 500 MG DOSING   metFORMIN (GLUCOPHAGE) 500 MG tablet  Sig: Take 2 tablets (1,000 mg total) by mouth daily.    Dispense:  180 tablet    Refill:  3    CANCEL ONCE DAILY 500 MG DOSING     See below for relevant physical exam findings  See below for recent lab and imaging results reviewed  Medications, allergies, PMH, PSH, SocH, FamH reviewed below    Follow-up instructions: Return in about 3 months (around 01/03/2021) for monitor A1C w/ Dr Zigmund Daniel, see [name] sooner if needed.                                        Exam:  BP 108/72 (BP Location: Left Arm, Patient Position: Sitting, Cuff Size: Normal)   Pulse 95   Temp 98 F (36.7 C) (Oral)   Wt 118 lb 0.6 oz (53.5 kg)   BMI 23.84 kg/m  Constitutional: VS see above. General Appearance: alert, well-developed, well-nourished, NAD Neck: No masses, trachea midline.   Respiratory: Normal respiratory effort. no wheeze, no rhonchi, no rales, (+)coarse breath sounds at bilateral lung bases sounds like atelectasis  Cardiovascular: S1/S2 normal, no murmur, no rub/gallop auscultated. RRR.  Musculoskeletal: Gait normal.  Neurological: Normal balance/coordination. No tremor. Skin: warm, dry, intact.  Psychiatric: Normal judgment/insight. Normal mood and affect. Oriented x3.   Current Meds  Medication Sig   albuterol (PROVENTIL) (2.5 MG/3ML) 0.083% nebulizer solution Take 3 mLs (2.5 mg total) by nebulization every 6 (six) hours as needed for wheezing or shortness of breath.   Respiratory Therapy Supplies (NEBULIZER AIR TUBE/PLUGS) MISC As directed w/ nebulizer for Dx COPD   Respiratory Therapy Supplies (NEBULIZER) DEVI As directed for Dx COPD    Allergies  Allergen Reactions   Codeine Itching    Feels like something crawling on her skin    Tetanus Toxoids Swelling    Patient stated,"I was given a Pneumonia shot at the same time I got the Tetanus shot, in the same arm."    Patient Active Problem List   Diagnosis Date Noted   Arthritis of right shoulder region    Left wrist tendonitis 08/12/2020   Dysphagia    Gastroesophageal reflux disease with esophagitis without hemorrhage    Gastritis and gastroduodenitis    Gastric nodule    Colon cancer screening    Arthritis of left shoulder region    S/P reverse total shoulder arthroplasty, 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   Encounter for screening for lung cancer 04/13/2019   Primary osteoarthritis of both shoulders 03/06/2019   Chronic hypoxemic respiratory failure ([place]) 12/22/2018   Osteopenia 12/20/2017   Prediabetes 09/23/2017   Colonoscopy refused 05/29/2017   COPD, severe ([place]) 05/29/2017   Multiple pulmonary nodules 05/29/2017   Trigger thumb, right thumb 05/29/2017   Primary osteoarthritis of first carpometacarpal joint of left hand 05/29/2017   Kidney lesion 12/14/2016   Erythrocytosis 11/13/2016   Breast  cancer ([place]) 09/20/2016   Essential hypertension 09/20/2016   Insomnia 09/20/2016   Mixed hyperlipidemia 09/20/2016    Family History  Problem Relation Age of Onset   Breast cancer Mother    Diabetes Brother    Colon cancer Neg Hx    Esophageal cancer Neg Hx     Social History   Tobacco Use  Smoking Status Former   Packs/day: 2.50   Years: 44.00   Pack years: 110.00   Types: Cigarettes   Quit date: 11/29/2016   Years since quitting: 3.8  Smokeless Tobacco Never    Past Surgical History:  Procedure Laterality Date   BIOPSY  07/11/2020   Procedure: BIOPSY;  Surgeon: Lavena Bullion, DO;  Location: [facility];  Service: Gastroenterology;;   BIOPSY  08/11/2020   Procedure: BIOPSY;  Surgeon: Milus Banister, MD;  Location: [facility];  Service: Endoscopy;;   BREAST BIOPSY     BREAST LUMPECTOMY Left 2021   BREAST LUMPECTOMY WITH AXILLARY LYMPH NODE BIOPSY  2016   COLONOSCOPY  2017   [facility]. think there was colon polyps   COLONOSCOPY WITH PROPOFOL N/A 07/11/2020   Procedure: COLONOSCOPY WITH PROPOFOL;  Surgeon: Lavena Bullion, DO;  [facility]: [facility];  Service: Gastroenterology;  Laterality: N/A;   ESOPHAGOGASTRODUODENOSCOPY (EGD) WITH PROPOFOL N/A 07/11/2020   Procedure: ESOPHAGOGASTRODUODENOSCOPY (EGD) WITH PROPOFOL;  Surgeon: Lavena Bullion, DO;  [facility];  Service: Gastroenterology;  Laterality: N/A;   ESOPHAGOGASTRODUODENOSCOPY (EGD) WITH PROPOFOL N/A 08/11/2020   Procedure: ESOPHAGOGASTRODUODENOSCOPY (EGD) WITH PROPOFOL;  Surgeon: Milus Banister, MD;  [facility];  Service: Endoscopy;  Laterality: N/A;   EUS N/A 08/11/2020   Procedure: UPPER ENDOSCOPIC ULTRASOUND (EUS) RADIAL;  Surgeon: Milus Banister, MD;  [facility];  Service: Endoscopy;  Laterality: N/A;   HAND SURGERY Left    PARTIAL HYSTERECTOMY     SAVORY DILATION N/A 07/11/2020   Procedure: SAVORY DILATION;   Surgeon: Lavena Bullion, DO;  [facility];  Service: Gastroenterology;  Laterality: N/A;   TOTAL SHOULDER ARTHROPLASTY Left 04/12/2020   Procedure: LEFT REVERSE TOTAL SHOULDER ARTHROPLASTY;  Surgeon: Meredith Pel, MD;  Location: [facility];  Service: Orthopedics;  Laterality: Left;   TOTAL SHOULDER ARTHROPLASTY Right 09/13/2020   Procedure: RIGHT TOTAL SHOULDER ARTHROPLASTY;  Surgeon: Meredith Pel, MD;  Location: [facility] [place] [facility];  Service: Orthopedics;  Laterality: Right;    Immunization History  Administered Date(s) Administered   Influenza, Seasonal, Injecte, Preservative Fre 10/15/2016   Influenza,inj,Quad PF,6+ Mos 09/23/2017, 09/16/2018, 10/14/2019, 10/04/2020   PFIZER(Purple Top)SARS-COV-2 Vaccination 04/14/2019, 05/05/2019, 12/05/2019   Pneumococcal Polysaccharide-23 05/29/2017   Tdap 05/29/2017   Zoster Recombinat (Shingrix) 04/08/2020, 08/13/2020    Recent Results (from the past 2160 hour(s))  SARS CORONAVIRUS 2 (TAT 6-24 HRS) Nasopharyngeal Nasopharyngeal Swab     Status: None   Collection Time: 07/07/20  2:06 PM   Specimen: Nasopharyngeal Swab  Result Value Ref Range   SARS Coronavirus 2 NEGATIVE NEGATIVE    Comment: (NOTE) SARS-CoV-2 target nucleic acids are NOT DETECTED.  The SARS-CoV-2 RNA is generally detectable in upper and lower respiratory specimens during the acute phase of infection. Negative results do not preclude SARS-CoV-2 infection, do not rule out co-infections with other pathogens, and should not be used as the sole basis for treatment or other patient management decisions. Negative results must be combined with clinical observations, patient history, and epidemiological information. The expected result is Negative.  Fact Sheet for Patients: SugarRoll.be  Fact Sheet for Healthcare Providers: https://www.woods-mathews.com/  This test is not yet approved or cleared by the [place] FDA and  has  been authorized for detection and/or diagnosis of SARS-CoV-2 by FDA under an Emergency Use Authorization (EUA). This EUA will remain  in effect (meaning this test can be used) for the duration of the COVID-19 declaration under Se ction 564(b)(1) of the Act, 21 U.S.C. section 360bbb-3(b)(1), unless the authorization is terminated or revoked sooner.  Performed at [facility], [place] [address], [place], [place]  [id number]   Glucose, capillary     Status: None   Collection Time: 07/11/20  7:33 AM  Result Value Ref Range   Glucose-Capillary 85 70 - 99 mg/dL    Comment: Glucose reference range applies only to samples taken after fasting for at least 8 hours.  Surgical pathology     Status: None   Collection Time: 07/11/20  9:00 AM  Result Value Ref Range   SURGICAL PATHOLOGY      SURGICAL PATHOLOGY CASE: [id number] PATIENT: [place] Surgical Pathology Report     Clinical History: Colon cancer screening, Dysphagia, Chronic constipation, 55m savory dilation  (kc)     FINAL MICROSCOPIC DIAGNOSIS:  A.   STOMACH BIOPSY: - Gastric antral and oxyntic mucosa with focal erosion and slight inflammation. - Warthin-Starry negative for Helicobacter pylori. - No intestinal metaplasia, dysplasia or carcinoma  B.   STOMACH NODULE, BIOPSY: - Benign gastric mucosa with slight hyperemia. - No intestinal metaplasia, dysplasia or carcinoma.    GROSS DESCRIPTION:  A: Received in formalin are tan, soft tissue fragments that are submitted in toto. Number: 4 size: 0.3-0.5 cm blocks: 1  B: Received in formalin are tan, soft tissue fragments that are submitted in toto. Number: 2 size: 0.2 and 0.5 cm blocks: 1   Final Diagnosis performed by JClaudette Laws MD.   Electronically signed 07/12/2020 Technical component performed at [facility] [address] [place] [address]  Professional component performed at [facility] [address] [place] [id number]  Immunohistochemistry Technical component (if applicable) [facility] [address] [place] [address]   IMMUNOHISTOCHEMISTRY DISCLAIMER (if applicable): Some of these immunohistochemical stains may have been developed and the performance characteristics determine by [facility] Some may not have been cleared or approved by the U.S. Food and Drug Administration. The FDA has determined that such clearance or approval is not necessary. This test is used for clinical purposes. It should not be regarded as investigational or for research. This laboratory is certified under the [place](CLIA-88) as qualified to perform high complexity clinical laboratory testing.  The controls stained approp riately.   Glucose, capillary     Status: None   Collection Time: 08/11/20  9:51 AM  Result Value Ref Range   Glucose-Capillary 92 70 - 99 mg/dL    Comment: Glucose reference range applies only to samples taken after fasting for at least 8 hours.  Surgical pathology     Status: None   Collection Time: 08/11/20 10:23 AM  Result Value Ref Range   SURGICAL PATHOLOGY      SURGICAL PATHOLOGY CASE: [id number] PATIENT: VTanja PortSurgical Pathology Report     Clinical History: Gastric nodule (crm)     FINAL MICROSCOPIC DIAGNOSIS:  A. STOMACH, SUBEPITHELIAL LESION, BIOPSY: - Benign gastric mucosa with focal chronic inflammation, see comment. - No dysplasia or malignancy.  COMMENT:  A submucosal lesion is not identified, although the fragments are somewhat superficial.   GROSS DESCRIPTION:  Received in formalin are tan, soft tissue fragments that are submitted in toto. Number: Multiple size: Range from 0.1 to 0.6 cm blocks: 1 (Craig Staggers7/14/2022)    Final Diagnosis performed by JVicente Males MD.   Electronically  signed 08/12/2020 Technical and / or Professional components performed at [facility] [address] [place] [address]  Immunohistochemistry Technical component (if applicable) was performed  at [facility]. [address], [place], [place], [place] [address].    IMMUNOHISTOCHEMISTRY DISCLAIMER (if applicable): Some of these immunohistochemical stains may have been developed and the performance characteristics determine by [facility]. Some may not have been cleared or approved by the U.S. Food and Drug Administration. The FDA has determined that such clearance or approval is not necessary. This test is used for clinical purposes. It should not be regarded as investigational or for research. This laboratory is certified under the [place] (CLIA-88) as qualified to perform high complexity clinical laboratory testing.  The controls stained appropriately.   Glucose, capillary     Status: Abnormal   Collection Time: 09/05/20  8:39 AM  Result Value Ref Range   Glucose-Capillary 102 (H) 70 - 99 mg/dL    Comment: Glucose reference range applies only to samples taken after fasting for at least 8 hours.  Hemoglobin A1c per protocol     Status: Abnormal   Collection Time: 09/05/20  8:53 AM  Result Value Ref Range   Hgb A1c MFr Bld 7.0 (H) 4.8 - 5.6 %    Comment: (NOTE) Pre diabetes:          5.7%-6.4%  Diabetes:              >6.4%  Glycemic control for   <7.0% adults with diabetes    Mean Plasma Glucose 154.2 mg/dL    Comment: Performed at [facility] [address] [place], [place] [id number]  Basic metabolic panel per protocol     Status: Abnormal   Collection Time: 09/05/20  8:53 AM  Result Value Ref Range   Sodium 137 135 - 145 mmol/L   Potassium 4.4 3.5 - 5.1 mmol/L   Chloride 101 98 - 111 mmol/L   CO2 28 22 - 32 mmol/L   Glucose, Bld 108 (H) 70 - 99 mg/dL    Comment:  Glucose reference range applies only to samples taken after fasting for at least 8 hours.   BUN 14 6 - 20 mg/dL   Creatinine, Ser 0.81 0.44 - 1.00 mg/dL   Calcium 9.4 8.9 - 10.3 mg/dL   GFR, Estimated >60 >60 mL/min    Comment: (NOTE) Calculated using the CKD-EPI Creatinine Equation (2021)    Anion gap 8 5 - 15    Comment: Performed at [facility] [address] [place], [place] [address]  CBC per protocol     Status: Abnormal   Collection Time: 09/05/20  8:53 AM  Result Value Ref Range   WBC 12.6 (H) 4.0 - 10.5 K/uL   RBC 4.52 3.87 - 5.11 MIL/uL   Hemoglobin 12.7 12.0 - 15.0 g/dL   HCT 39.7 36.0 - 46.0 %   MCV 87.8 80.0 - 100.0 fL   MCH 28.1 26.0 - 34.0 pg   MCHC 32.0 30.0 - 36.0 g/dL   RDW 13.8 11.5 - 15.5 %   Platelets 412 (H) 150 - 400 K/uL   nRBC 0.0 0.0 - 0.2 %    Comment: Performed at [facility], [place] [address], [place], [place] [address]  Surgical pcr screen     Status: None   Collection Time: 09/05/20  9:53 AM   Specimen: Nasal Mucosa; Nasal Swab  Result Value Ref Range   MRSA, PCR NEGATIVE NEGATIVE   Staphylococcus aureus NEGATIVE NEGATIVE    Comment: (NOTE) The Xpert SA Assay (FDA approved for NASAL specimens in  patients 71 years of age and older), is one component of a comprehensive surveillance program. It is not intended to diagnose infection nor to guide or monitor treatment. Performed at [facility], [place] [address], [place], [place] [address]   Glucose, capillary     Status: Abnormal   Collection Time: 09/13/20  8:46 AM  Result Value Ref Range   Glucose-Capillary 115 (H) 70 - 99 mg/dL    Comment: Glucose reference range applies only to samples taken after fasting for at least 8 hours.   Comment 1 Notify RN    Comment 2 Repeat Test    Comment 3 Call MD NNP PA CNM   Glucose, capillary     Status: Abnormal   Collection Time: 09/13/20  2:47 PM  Result Value Ref Range   Glucose-Capillary 163 (H) 70 - 99 mg/dL    Comment: Glucose  reference range applies only to samples taken after fasting for at least 8 hours.    DG Chest 2 View  Result Date: 10/04/2020 CLINICAL DATA:  COPD, cough EXAM: CHEST - 2 VIEW COMPARISON:  None. FINDINGS: The heart size and mediastinal contours are within normal limits. Both lungs are clear. Disc degenerative disease of the thoracic spine. Status post bilateral reverse shoulder arthroplasty. IMPRESSION: No acute abnormality of the lungs. Electronically Signed   By: Eddie Candle M.D.   On: 10/04/2020 08:31       All questions at time of visit were answered - patient instructed to contact office with any additional concerns or updates. ER/RTC precautions were reviewed with the patient as applicable.   Please note: manual typing as well as voice recognition software may have been used to produce this document - typos may escape review. Please contact Dr. Sheppard Coil for any needed clarifications.

## 2020-10-04 NOTE — Patient Instructions (Addendum)
A1C up to 7.0 --> increase metformin from 500 mg daily to 1000 mg daily, recheck A1C in 3 mos  Lungs sound raspy at the bases, let's get X-day today and new nebulizer machine!   Trazodone increase from 50 --> 100 --> 150 mg, will check in via MyChart in 2 weeks

## 2020-10-05 ENCOUNTER — Ambulatory Visit: Payer: BC Managed Care – PPO | Admitting: Physical Therapy

## 2020-10-05 ENCOUNTER — Encounter: Payer: Self-pay | Admitting: Osteopathic Medicine

## 2020-10-05 ENCOUNTER — Encounter: Payer: Self-pay | Admitting: Physical Therapy

## 2020-10-05 DIAGNOSIS — R600 Localized edema: Secondary | ICD-10-CM | POA: Diagnosis not present

## 2020-10-05 DIAGNOSIS — R29898 Other symptoms and signs involving the musculoskeletal system: Secondary | ICD-10-CM

## 2020-10-05 NOTE — Patient Instructions (Signed)
Access Code: [email or web address] URL: [email or web address] Date: 10/05/2020 Prepared by: Isabelle Course  Exercises Supine Shoulder Flexion Extension AAROM with Dowel - 1 x daily - 7 x weekly - 3 sets - 10 reps - 5 sec hold Supine Shoulder External Rotation in 45 Degrees Abduction AAROM with Dowel - 1 x daily - 7 x weekly - 3 sets - 10 reps - 5 sec hold Standing Shoulder Abduction AAROM with Dowel - 1 x daily - 7 x weekly - 3 sets - 10 reps - 5 sec hold Standing Single Arm Shoulder Flexion Towel Slide at Table Top - 1 x daily - 7 x weekly - 3 sets - 10 reps - 5 sec hold Shoulder Abduction Towel Slide at Table Top - 1 x daily - 7 x weekly - 3 sets - 10 reps - 5 sec hold Seated Scapular Retraction - 1 x daily - 7 x weekly - 3 sets - 10 reps - 3 seconds hold

## 2020-10-05 NOTE — Therapy (Addendum)
[place], [place], [address] Phone: [phone]   Fax:  [phone]  Physical Therapy Evaluation and Discharge  Patient Details  Name: Sherri Gibson MRN: [id number] Date of Birth: 1960/11/14 Referring Provider (PT): Meredith Pel   Encounter Date: 10/05/2020   PT End of Session - 10/05/20 0923     Visit Number 1    Number of Visits 8    Date for PT Re-Evaluation 11/02/20    PT Start Time 0845    PT Stop Time 0920    PT Time Calculation (min) 35 min    Activity Tolerance Patient tolerated treatment well    Behavior During Therapy [facility] for tasks assessed/performed             Past Medical History:  Diagnosis Date   Anemia    as a child   Arthritis    Both Shoulders   Breast cancer ([place]) 6962   Left    Complication of anesthesia    states she had a hard time staying awake after hand surgery   COPD (chronic obstructive pulmonary disease) ([place])    uses O2 at 2l at night   High blood pressure    High cholesterol    History of kidney stones    Personal history of radiation therapy 2016   Left Breast Cancer   Pneumonia    as a child   Pre-diabetes    Prediabetes    Wears dentures    upper dentures    Past Surgical History:  Procedure Laterality Date   BIOPSY  07/11/2020   Procedure: BIOPSY;  Surgeon: Lavena Bullion, DO;  [facility];  Service: Gastroenterology;;   BIOPSY  08/11/2020   Procedure: BIOPSY;  Surgeon: Milus Banister, MD;  [facility];  Service: Endoscopy;;   BREAST BIOPSY     BREAST LUMPECTOMY Left 2021   BREAST LUMPECTOMY WITH AXILLARY LYMPH NODE BIOPSY  2016   COLONOSCOPY  2017   [facility]. think there was colon polyps   COLONOSCOPY WITH PROPOFOL N/A 07/11/2020   Procedure: COLONOSCOPY WITH PROPOFOL;  Surgeon: Lavena Bullion, DO;  [facility];  Service: Gastroenterology;   Laterality: N/A;   ESOPHAGOGASTRODUODENOSCOPY (EGD) WITH PROPOFOL N/A 07/11/2020   Procedure: ESOPHAGOGASTRODUODENOSCOPY (EGD) WITH PROPOFOL;  Surgeon: Lavena Bullion, DO;  [facility];  Service: Gastroenterology;  Laterality: N/A;   ESOPHAGOGASTRODUODENOSCOPY (EGD) WITH PROPOFOL N/A 08/11/2020   Procedure: ESOPHAGOGASTRODUODENOSCOPY (EGD) WITH PROPOFOL;  Surgeon: Milus Banister, MD;  [facility];  Service: Endoscopy;  Laterality: N/A;   EUS N/A 08/11/2020   Procedure: UPPER ENDOSCOPIC ULTRASOUND (EUS) RADIAL;  Surgeon: Milus Banister, MD;  [facility];  Service: Endoscopy;  Laterality: N/A;   HAND SURGERY Left    PARTIAL HYSTERECTOMY     SAVORY DILATION N/A 07/11/2020   Procedure: SAVORY DILATION;  Surgeon: Lavena Bullion, DO;  [facility];  Service: Gastroenterology;  Laterality: N/A;   TOTAL SHOULDER ARTHROPLASTY Left 04/12/2020   Procedure: LEFT REVERSE TOTAL SHOULDER ARTHROPLASTY;  Surgeon: Meredith Pel, MD;  Location: [place];  Service: Orthopedics;  Laterality: Left;   TOTAL SHOULDER ARTHROPLASTY Right 09/13/2020   Procedure: RIGHT TOTAL SHOULDER ARTHROPLASTY;  Surgeon: Meredith Pel, MD;  Location: [place];  Service: Orthopedics;  Laterality: Right;    There were no vitals filed for this visit.    Subjective Assessment - 10/05/20 9528  Subjective Pt is s/p Rt reverse total shoulder arthroscopy 09/13/20. Pt reports MD states she is able to lift < 1 pound. MD note states she is allowed PROM and AAROM and no lifting for 4 more weeks. Pt states her husband has stage 4 cancer and she is his caretaker so she has been "doing what I have to do"    Pertinent History Lt total shoulder arthroscopy 03/2020    Limitations Lifting;House hold activities    Patient Stated Goals use my arm better    Currently in Pain? No/denies                [facility] PT Assessment - 10/05/20 0001       Assessment   Medical Diagnosis s/p Rt  reverse total shoulder arthroplasty    Referring Provider (PT) Meredith Pel    Onset Date/Surgical Date 09/13/20    Hand Dominance Right    Next MD Visit 10/26/20    Prior Therapy prior to surgery and s/p Lt total shoulder      Precautions   Precaution Comments no Rt shoulder IR, extension      Balance Screen   Has the patient fallen in the past 6 months No    Has the patient had a decrease in activity level because of a fear of falling?  No    Is the patient reluctant to leave their home because of a fear of falling?  No      Prior Function   Level of Independence Independent      Observation/Other Assessments   Focus on Therapeutic Outcomes (FOTO)  49 functional status measure      ROM / Strength   AROM / PROM / Strength PROM      PROM   PROM Assessment Site Shoulder    Right/Left Shoulder Right;Left    Right Shoulder Flexion 140 Degrees    Right Shoulder ABduction 128 Degrees    Right Shoulder External Rotation 26 Degrees    Left Shoulder Flexion 150 Degrees    Left Shoulder ABduction 150 Degrees    Left Shoulder External Rotation 32 Degrees      Palpation   Palpation comment trigger points throughout Rt biceps and deltoids                        Objective measurements completed on examination: See above findings.       [facility] PT Treatment/Exercise - 10/05/20 0001       Exercises   Exercises Shoulder      Shoulder Exercises: Supine   External Rotation AAROM;10 reps    External Rotation Limitations in scapular plane    Flexion AAROM;10 reps      Shoulder Exercises: Standing   ABduction 10 reps;AAROM    Other Standing Exercises table slides into flexion and abduction x 10 bilat                  Upper Extremity Functional Index Score:  /80   PT Education - 10/05/20 9758     Education Details PT POC and goals, HEP    Person(s) Educated Patient    Methods Explanation;Demonstration;Handout    Comprehension Returned  demonstration;Verbalized understanding                 PT Long Term Goals - 10/05/20 0933       PT LONG TERM GOAL #1   Title Pt will be independent with HEP  Time 4    Period Weeks    Status New    Target Date 11/02/20      PT LONG TERM GOAL #2   Title Pt will improve FOTO to >= 71 to demo improved functional mobility    Time 4    Period Weeks    Status New    Target Date 11/02/20      PT LONG TERM GOAL #3   Title Pt will improve Rt shoulder ROM to equal Lt shoulder to improve functional mobility    Time 4    Period Weeks    Status New    Target Date 11/02/20                    Plan - 10/05/20 0923     Clinical Impression Statement Pt is a 60 y/o female referred s/p Rt reverse total shoulder arthroscopy 09/13/20. Pt presents with decreased ROM, impaired strength and mobility and will benefit from skilled PT to address deficits and improve functional independence    Personal Factors and Comorbidities Comorbidity 3+;Past/Current Experience;Other    Examination-Activity Limitations Bathing;Lift;Carry;Reach Overhead    Examination-Participation Restrictions Cleaning;Meal Prep;Yard Work    Stability/Clinical Decision Making Stable/Uncomplicated    Clinical Decision Making Low    Rehab Potential Good    PT Frequency 2x / week    PT Duration 4 weeks    PT Treatment/Interventions Cryotherapy;Electrical Stimulation;Iontophoresis 82m/ml Dexamethasone;Moist Heat;Neuromuscular re-education;Therapeutic exercise;Therapeutic activities;Taping;Dry needling;Passive range of motion;Manual techniques;Patient/family education;Vasopneumatic Device    PT Next Visit Plan assess HEP, PROM and AAROM    PT Home Exercise Plan [id number]   Consulted and Agree with Plan of Care Patient             Patient will benefit from skilled therapeutic intervention in order to improve the following deficits and impairments:  Decreased strength, Impaired UE functional use, Decreased  activity tolerance, Decreased mobility, Decreased knowledge of precautions  Visit Diagnosis: Other symptoms and signs involving the musculoskeletal system - Plan: PT plan of care cert/re-cert  Localized edema - Plan: PT plan of care cert/re-cert     Problem List Patient Active Problem List   Diagnosis Date Noted   Arthritis of right shoulder region    Left wrist tendonitis 08/12/2020   Dysphagia    Gastroesophageal reflux disease with esophagitis without hemorrhage    Gastritis and gastroduodenitis    Gastric nodule    Colon cancer screening    Arthritis of left shoulder region    S/P reverse total shoulder arthroplasty, 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   Encounter for screening for lung cancer 04/13/2019   Primary osteoarthritis of both shoulders 03/06/2019   [place]emic [place] 12/22/2018   Osteopenia 12/20/2017   Prediabetes 09/23/2017   Colonoscopy refused 05/29/2017   COPD, severe ([place] 05/29/2017   Multiple pulmonary nodules 05/29/2017   Trigger thumb, right thumb 05/29/2017   Primary osteoarthritis of first carpometacarpal joint of left hand 05/29/2017   Kidney lesion 12/14/2016   Erythrocytosis 11/13/2016   [place] 09/20/[place]   Essential hypertension 09/20/2016   Insomnia 09/20/2016   Mixed hyperlipidemia 09/20/2016   PHYSICAL THERAPY DISCHARGE SUMMARY  Visits from Start of Care: 1  Current functional level related to goals / functional outcomes: None due to eval only   Remaining deficits: See above   Education / Equipment: HEP   Patient agrees to discharge. Patient goals were not met. Patient is being discharged due to not returning since the last visit.  Isabelle Course, PT,DPT10/21/222:38 PM  Isabelle Course, PT  Isabelle Course, PT 10/05/2020, 10:52 AM  [facility] [place], [place], [address] Phone: [phone]   Fax:  [phone]  Name: MALI EPPARD MRN: [id number] Date of Birth: Jun 21, 1960

## 2020-10-07 ENCOUNTER — Ambulatory Visit: Payer: BC Managed Care – PPO | Admitting: Orthopedic Surgery

## 2020-10-07 ENCOUNTER — Other Ambulatory Visit: Payer: Self-pay

## 2020-10-07 ENCOUNTER — Other Ambulatory Visit: Payer: Self-pay | Admitting: Osteopathic Medicine

## 2020-10-07 DIAGNOSIS — M19132 Post-traumatic osteoarthritis, left wrist: Secondary | ICD-10-CM | POA: Diagnosis not present

## 2020-10-07 DIAGNOSIS — M25532 Pain in left wrist: Secondary | ICD-10-CM | POA: Diagnosis not present

## 2020-10-07 DIAGNOSIS — M19139 Post-traumatic osteoarthritis, unspecified wrist: Secondary | ICD-10-CM | POA: Insufficient documentation

## 2020-10-07 NOTE — Progress Notes (Signed)
Office Visit Note   Patient: Sherri Gibson           Date of Birth: August 29, 1960           MRN: [id number] Visit Date: 10/07/2020              Requested by: Meredith Pel, [place],  [place] [address] PCP: Emeterio Reeve, DO   Assessment & Plan: Visit Diagnoses:  1. Scapholunate advanced collapse of left wrist     Plan: Discussed the diagnosis and nature of scapholunate injury and scapholunate advanced collapse.  Fortunately she has intermittent symptoms at this point.  They seem to be activity related.  We discussed treatment options for her wrist including conservative management with anti-inflammatory medications bracing and corticosteroid injections.  At this point she is not symptomatic enough or have severe enough radiographic changes for surgery.  We will start her with anti-inflammatory medicines and a wrist brace as needed when her symptoms worsen.  We can see her back in the office as needed.  Follow-Up Instructions: No follow-ups on file.   Orders:  No orders of the defined types were placed in this encounter.  No orders of the defined types were placed in this encounter.     Procedures: No procedures performed   Clinical Data: No additional findings.   Subjective: Chief Complaint  Patient presents with   Left Wrist - Pain    This is a 60 year old right-hand-dominant female who presents with left wrist pain.  Her pains been going on for several years.  She describes pain at the dorsal and central aspect of the wrist that radiates across the dorsal aspect of her wrist.  She also describes pain at the volar radial aspect of the wrist.  She denies any specific injury but notes that she has been holding her hands over the years.  Her pain is intermittent but can be 10 out of 10 at worst.  She has no pain at all today.  Her pain is activity related and is worst with increased house or yard work.  She does not take any NSAIDs.  She has worn  a brace occasionally which helps with her symptoms.   Review of Systems  Constitutional: Negative.   Respiratory: Negative.    Cardiovascular: Negative.   Skin: Negative.   Neurological: Negative.     Objective: Vital Signs: There were no vitals taken for this visit.  Physical Exam Constitutional:      Appearance: Normal appearance.  Cardiovascular:     Rate and Rhythm: Normal rate.     Pulses: Normal pulses.  Pulmonary:     Effort: Pulmonary effort is normal.  Skin:    General: Skin is warm.     Capillary Refill: Capillary refill takes less than 2 seconds.  Neurological:     General: No focal deficit present.     Mental Status: She is alert.    Left Hand Exam   Tenderness  Left hand tenderness location: No TTP.   Range of Motion  Wrist  Left wrist extension: 70.  Left wrist flexion: 45.  Pronation:  normal  Supination:  normal   Muscle Strength  Wrist extension: 5/5  Wrist flexion: 5/5  Grip:  5/5   Tests  Phalen's Sign: negative  Other  Erythema: absent Sensation: normal Pulse: present  Comments:  No TTP around wrist or along palpable FCR tendon.  No pain w/ resisted wrist flexion.  No swelling.  Specialty Comments:  No specialty comments available.  Imaging: Prior x-ray dated 08/18/2020 of the left wrist reviewed by me today.  It demonstrates mild scapholunate diastases.  There is a mild DISI deformity with extension of the lunate.  There is minimal if any degenerative changes at the radial scaphoid joint.  She does have evidence of a prior [facility] arthroplasty with a tight rope type construct.   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   Encounter for screening for lung cancer 04/13/2019   Primary osteoarthritis of both shoulders 03/06/2019   Chronic hypoxemic respiratory failure ([place]) 12/22/2018   Osteopenia 12/20/2017   Prediabetes 09/23/2017   Colonoscopy refused 05/29/2017   COPD, severe ([place]) 05/29/2017   Multiple pulmonary nodules 05/29/2017   Trigger thumb, right thumb 05/29/2017   Primary osteoarthritis of first carpometacarpal joint of left hand 05/29/2017   Kidney lesion 12/14/2016   Erythrocytosis 11/13/2016   Breast cancer ([place]) 09/20/2016   Essential hypertension 09/20/2016   Insomnia 09/20/2016   Mixed hyperlipidemia 09/20/2016   Past Medical History:  Diagnosis Date   Anemia    as a child   Arthritis    Both Shoulders   Breast cancer ([place]) [id number]   Left    Complication of anesthesia    states she had a hard time staying awake after hand surgery   COPD (chronic obstructive pulmonary disease) ([place])    uses O2 at 2l at night   High blood pressure    High cholesterol    History of kidney stones    Personal history of radiation therapy 2016   Left Breast Cancer   Pneumonia    as a child   Pre-diabetes    Prediabetes    Wears dentures    upper dentures    Family History  Problem Relation Age of Onset   Breast cancer Mother    Diabetes Brother    Colon cancer Neg Hx    Esophageal cancer Neg Hx     Past Surgical History:  Procedure Laterality Date   BIOPSY  07/11/2020   Procedure: BIOPSY;  Surgeon: Lavena Bullion, DO;  [facility];  Service: Gastroenterology;;   BIOPSY  08/11/2020   Procedure: BIOPSY;  Surgeon: Milus Banister, MD;  [facility];  Service: Endoscopy;;   BREAST BIOPSY     BREAST LUMPECTOMY Left 2021   BREAST LUMPECTOMY WITH AXILLARY LYMPH NODE BIOPSY  2016   COLONOSCOPY  2017   [facility]. think  there was colon polyps   COLONOSCOPY WITH PROPOFOL N/A 07/11/2020   Procedure: COLONOSCOPY WITH PROPOFOL;  Surgeon: Lavena Bullion, DO;  [facility];  Service: Gastroenterology;  Laterality: N/A;   ESOPHAGOGASTRODUODENOSCOPY (EGD) WITH PROPOFOL N/A 07/11/2020   Procedure: ESOPHAGOGASTRODUODENOSCOPY (EGD) WITH PROPOFOL;  Surgeon: Lavena Bullion, DO;  [facility];  Service: Gastroenterology;  Laterality: N/A;   ESOPHAGOGASTRODUODENOSCOPY (EGD) WITH PROPOFOL N/A 08/11/2020   Procedure: ESOPHAGOGASTRODUODENOSCOPY (EGD) WITH PROPOFOL;  Surgeon: Milus Banister, MD;  [facility];  Service: Endoscopy;  Laterality: N/A;   EUS N/A 08/11/2020   Procedure: UPPER ENDOSCOPIC ULTRASOUND (EUS) RADIAL;  Surgeon: Milus Banister, MD;  [facility];  Service: Endoscopy;  Laterality: N/A;   HAND SURGERY Left    PARTIAL HYSTERECTOMY     SAVORY DILATION N/A 07/11/2020   Procedure: SAVORY DILATION;  Surgeon: Lavena Bullion, DO;  [facility];  Service: Gastroenterology;  Laterality: N/A;   TOTAL SHOULDER ARTHROPLASTY Left 04/12/2020   Procedure: LEFT REVERSE TOTAL SHOULDER ARTHROPLASTY;  Surgeon: Meredith Pel, MD;  Location: [place];  Service: Orthopedics;  Laterality: Left;   TOTAL SHOULDER ARTHROPLASTY Right 09/13/2020   Procedure: RIGHT TOTAL SHOULDER ARTHROPLASTY;  Surgeon: Meredith Pel, MD;  Location: [place];  Service: Orthopedics;  Laterality: Right;   Social History   Occupational History   Not on file  Tobacco Use   Smoking status: Former    Packs/day: 2.50    Years: 44.00    Pack years: 110.00    Types: Cigarettes    Quit date: 11/29/2016    Years since quitting: 3.8   Smokeless tobacco: Never  Vaping Use   Vaping Use: Never used  Substance and Sexual Activity   Alcohol use: Not Currently   Drug use: Never   Sexual activity: Yes    Partners: Male    Birth control/protection: None

## 2020-10-12 ENCOUNTER — Encounter: Payer: BC Managed Care – PPO | Admitting: Physical Therapy

## 2020-10-18 ENCOUNTER — Encounter: Payer: Self-pay | Admitting: Physical Therapy

## 2020-10-18 ENCOUNTER — Other Ambulatory Visit: Payer: Self-pay | Admitting: Osteopathic Medicine

## 2020-10-18 MED ORDER — TRAZODONE HCL 100 MG PO TABS: 100.0000 mg | ORAL_TABLET | Freq: Every evening | ORAL | 0 refills | Status: DC | PRN

## 2020-10-19 ENCOUNTER — Encounter: Payer: BC Managed Care – PPO | Admitting: Physical Therapy

## 2020-10-19 ENCOUNTER — Other Ambulatory Visit: Payer: Self-pay | Admitting: Osteopathic Medicine

## 2020-10-23 ENCOUNTER — Other Ambulatory Visit: Payer: Self-pay | Admitting: Osteopathic Medicine

## 2020-10-25 ENCOUNTER — Other Ambulatory Visit: Payer: Self-pay | Admitting: Osteopathic Medicine

## 2020-10-26 ENCOUNTER — Other Ambulatory Visit: Payer: Self-pay

## 2020-10-26 ENCOUNTER — Encounter: Payer: Self-pay | Admitting: Orthopedic Surgery

## 2020-10-26 ENCOUNTER — Ambulatory Visit (INDEPENDENT_AMBULATORY_CARE_PROVIDER_SITE_OTHER): Payer: BC Managed Care – PPO | Admitting: Orthopedic Surgery

## 2020-10-26 ENCOUNTER — Telehealth: Payer: Self-pay

## 2020-10-26 DIAGNOSIS — J449 Chronic obstructive pulmonary disease, unspecified: Secondary | ICD-10-CM | POA: Diagnosis not present

## 2020-10-26 DIAGNOSIS — Z96611 Presence of right artificial shoulder joint: Secondary | ICD-10-CM

## 2020-10-26 MED ORDER — TRAZODONE HCL 100 MG PO TABS: 100.0000 mg | ORAL_TABLET | Freq: Every evening | ORAL | 0 refills | Status: DC | PRN

## 2020-10-26 NOTE — Progress Notes (Signed)
Post-Op Visit Note   Patient: Sherri Gibson           Date of Birth: 1960/11/12           MRN: [id number] Visit Date: 10/26/2020 PCP: Emeterio Reeve, DO   Assessment & Plan:  Chief Complaint:  Chief Complaint  Patient presents with   Right Shoulder - Routine Post Op    Surgery 09/13/20 (6w 1d) Meredith Pel, MD  Right Total Shoulder Arthroplasty - Right     Visit Diagnoses:  1. History of arthroplasty of right shoulder     Plan: Patient is a 60 year old female who returns s/p right reverse shoulder arthroplasty on 09/13/2020.  Patient reports that she is doing well in regards to her shoulder.  She has not been to physical therapy except for 1 time due to her husband who is in the hospital currently.  She has been trying to do some home exercises while she is staying with him in the hospital.  She denies any fevers, chills, night sweats.  No drainage from the incision.  She is not taking any medications for pain.  On exam she has 3 degrees external rotation, 90 degrees abduction, 120 degrees forward flexion.  Active range of motion equivalent to passive range of motion.  Excellent external rotation actively and she is actually able to get her arm externally rotated in order to touch her hand to her head which she cannot do with the other side.  There was 1 suture sticking out of her incision without any surrounding erythema.  This was pulled out without issue.  No expressible drainage.  Overall patient is doing very well following her surgery.  plan for her to follow-up in 6 weeks for final check with Dr. Marlou Sa.  Follow-Up Instructions: No follow-ups on file.   Orders:  No orders of the defined types were placed in this encounter.  No orders of the defined types were placed in this encounter.   Imaging: No results found.  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   Encounter for screening for lung cancer 04/13/2019   Primary osteoarthritis of both shoulders 03/06/2019   Chronic hypoxemic respiratory failure ([place]) 12/22/2018   Osteopenia 12/20/2017   Prediabetes 09/23/2017   Colonoscopy refused 05/29/2017   COPD, severe ([place]) 05/29/2017   Multiple pulmonary nodules 05/29/2017   Trigger thumb, right thumb 05/29/2017   Primary osteoarthritis of first carpometacarpal joint of left hand 05/29/2017   Kidney lesion 12/14/2016   Erythrocytosis 11/13/2016   Breast cancer ([place]) 09/20/2016   Essential hypertension 09/20/2016   Insomnia 09/20/2016   Mixed hyperlipidemia 09/20/2016   Past Medical History:  Diagnosis Date   Anemia    as a child   Arthritis    Both Shoulders   Breast cancer ([place]) 1950   Left    Complication of anesthesia    states she had a hard time staying awake after hand surgery   COPD (chronic obstructive pulmonary disease) ([place])    uses O2 at 2l at night   High  blood pressure    High cholesterol    History of kidney stones    Personal history of radiation therapy 2016   Left Breast Cancer   Pneumonia    as a child   Pre-diabetes    Prediabetes    Wears dentures    upper dentures    Family History  Problem Relation Age of Onset   Breast cancer Mother    Diabetes Brother    Colon cancer Neg Hx    Esophageal cancer Neg Hx     Past Surgical History:  Procedure Laterality Date   BIOPSY  07/11/2020   Procedure: BIOPSY;  Surgeon: Lavena Bullion, DO;  [facility];  Service: Gastroenterology;;   BIOPSY  08/11/2020   Procedure: BIOPSY;  Surgeon: Milus Banister, MD;  [facility];   Service: Endoscopy;;   BREAST BIOPSY     BREAST LUMPECTOMY Left 2021   BREAST LUMPECTOMY WITH AXILLARY LYMPH NODE BIOPSY  2016   COLONOSCOPY  2017   [facility]. think there was colon polyps   COLONOSCOPY WITH PROPOFOL N/A 07/11/2020   Procedure: COLONOSCOPY WITH PROPOFOL;  Surgeon: Lavena Bullion, DO;  [facility];  Service: Gastroenterology;  Laterality: N/A;   ESOPHAGOGASTRODUODENOSCOPY (EGD) WITH PROPOFOL N/A 07/11/2020   Procedure: ESOPHAGOGASTRODUODENOSCOPY (EGD) WITH PROPOFOL;  Surgeon: Lavena Bullion, DO;  [facility];  Service: Gastroenterology;  Laterality: N/A;   ESOPHAGOGASTRODUODENOSCOPY (EGD) WITH PROPOFOL N/A 08/11/2020   Procedure: ESOPHAGOGASTRODUODENOSCOPY (EGD) WITH PROPOFOL;  Surgeon: Milus Banister, MD;  [facility];  Service: Endoscopy;  Laterality: N/A;   EUS N/A 08/11/2020   Procedure: UPPER ENDOSCOPIC ULTRASOUND (EUS) RADIAL;  Surgeon: Milus Banister, MD;  [facility];  Service: Endoscopy;  Laterality: N/A;   HAND SURGERY Left    PARTIAL HYSTERECTOMY     SAVORY DILATION N/A 07/11/2020   Procedure: SAVORY DILATION;  Surgeon: Lavena Bullion, DO;  [facility];  Service: Gastroenterology;  Laterality: N/A;   TOTAL SHOULDER ARTHROPLASTY Left 04/12/2020   Procedure: LEFT REVERSE TOTAL SHOULDER ARTHROPLASTY;  Surgeon: Meredith Pel, MD;  Location: [place];  Service: Orthopedics;  Laterality: Left;   TOTAL SHOULDER ARTHROPLASTY Right 09/13/2020   Procedure: RIGHT TOTAL SHOULDER ARTHROPLASTY;  Surgeon: Meredith Pel, MD;  Location: [place];  Service: Orthopedics;  Laterality: Right;   Social History   Occupational History   Not on file  Tobacco Use   Smoking status: Former    Packs/day: 2.50    Years: 44.00    Pack years: 110.00    Types: Cigarettes    Quit date: 11/29/2016    Years since quitting: 3.9   Smokeless tobacco: Never  Vaping Use   Vaping Use:  Never used  Substance and Sexual Activity   Alcohol use: Not Currently   Drug use: Never   Sexual activity: Yes    Partners: Male    Birth control/protection: None

## 2020-10-26 NOTE — Telephone Encounter (Signed)
Pt lvm requesting trazodone. She's aware Dr. Sheppard Coil has left and is switching to Dr. Zigmund Daniel. Mr. Depp is currently in the hospital battling Stage 4 lung cancer and she can't make a med refill appt.   Ok to refill per Dr. Zigmund Daniel.

## 2020-10-31 ENCOUNTER — Other Ambulatory Visit: Payer: Self-pay

## 2020-10-31 MED ORDER — TRAZODONE HCL 100 MG PO TABS: 100.0000 mg | ORAL_TABLET | Freq: Every evening | ORAL | 0 refills | Status: DC | PRN

## 2020-10-31 NOTE — Progress Notes (Signed)
Pt lvm stating trazodone was not on file with [facility].   Sent Rx to [facility]. Apologized to the patient. There was an e-scribe error and the Rx was not sent as originally intended last week.

## 2020-11-09 ENCOUNTER — Other Ambulatory Visit: Payer: Self-pay | Admitting: Pulmonary Disease

## 2020-11-09 DIAGNOSIS — R918 Other nonspecific abnormal finding of lung field: Secondary | ICD-10-CM

## 2020-11-09 DIAGNOSIS — J9611 Chronic respiratory failure with hypoxia: Secondary | ICD-10-CM

## 2020-11-09 DIAGNOSIS — J449 Chronic obstructive pulmonary disease, unspecified: Secondary | ICD-10-CM

## 2020-11-10 MED ORDER — SPIRIVA RESPIMAT 2.5 MCG/ACT IN AERS: 1.0000 | INHALATION_SPRAY | RESPIRATORY_TRACT | 0 refills | Status: DC | PRN

## 2020-11-10 NOTE — Addendum Note (Signed)
Addended by: Dessie Coma on: 11/10/2020 04:28 PM   Modules accepted: Orders

## 2020-11-22 ENCOUNTER — Telehealth: Payer: Self-pay | Admitting: Pulmonary Disease

## 2020-11-22 NOTE — Telephone Encounter (Signed)
Called patient to verify if she has been in contact with [employer]. She stated that her current POC from [facility] is not working and they will not fix it nor replace it. She has decided to get a POC from [employer]. Advised her that once we receive the form, we will complete it and fax it back to [employer].   I looked downstairs and did not see a fax for this patient. Vallarie Mare, have you received anything from [facility] for this patient?

## 2020-11-22 NOTE — Telephone Encounter (Signed)
I have not got anything from [employer] on this paitent

## 2020-11-22 NOTE — Telephone Encounter (Signed)
Called and spoke with Sherri Gibson. I have requested that she fax the form to the triage fax. Once it has been received, I will have JE to sign and fax back to [place].   Will leave this encounter open for follow up.

## 2020-11-24 NOTE — Telephone Encounter (Signed)
Caitlyn calling back regarding fax on this pt please call her an give her an update on this her # is [phone] she said the form was faxed to [phone].Hillery Hunter

## 2020-11-24 NOTE — Telephone Encounter (Signed)
Form will be signed and faxed today.

## 2020-11-25 ENCOUNTER — Telehealth (INDEPENDENT_AMBULATORY_CARE_PROVIDER_SITE_OTHER): Payer: BC Managed Care – PPO | Admitting: Pulmonary Disease

## 2020-11-25 ENCOUNTER — Encounter: Payer: Self-pay | Admitting: Pulmonary Disease

## 2020-11-25 DIAGNOSIS — R918 Other nonspecific abnormal finding of lung field: Secondary | ICD-10-CM | POA: Diagnosis not present

## 2020-11-25 DIAGNOSIS — J449 Chronic obstructive pulmonary disease, unspecified: Secondary | ICD-10-CM

## 2020-11-25 DIAGNOSIS — J9611 Chronic respiratory failure with hypoxia: Secondary | ICD-10-CM

## 2020-11-25 MED ORDER — ALBUTEROL SULFATE HFA 108 (90 BASE) MCG/ACT IN AERS: 2.0000 | INHALATION_SPRAY | RESPIRATORY_TRACT | 4 refills | Status: DC | PRN

## 2020-11-25 MED ORDER — SPIRIVA RESPIMAT 2.5 MCG/ACT IN AERS: 1.0000 | INHALATION_SPRAY | RESPIRATORY_TRACT | 3 refills | Status: DC | PRN

## 2020-11-25 MED ORDER — BUDESONIDE-FORMOTEROL FUMARATE 160-4.5 MCG/ACT IN AERO: 2.0000 | INHALATION_SPRAY | Freq: Two times a day (BID) | RESPIRATORY_TRACT | 3 refills | Status: DC

## 2020-11-25 NOTE — Progress Notes (Signed)
Virtual Visit via Video Note  I connected with Sherri Gibson on 11/25/20 at 11:45 AM EDT by a video enabled telemedicine application and verified that I am speaking with the correct person using two identifiers.  Location: Patient: Home Provider: [facility]   I discussed the limitations of evaluation and management by telemedicine and the availability of in person appointments. The patient expressed understanding and agreed to proceed.   I discussed the assessment and treatment plan with the patient. The patient was provided an opportunity to ask questions and all were answered. The patient agreed with the plan and demonstrated an understanding of the instructions.   The patient was advised to call back or seek an in-person evaluation if the symptoms worsen or if the condition fails to improve as anticipated.   Subjective:   PATIENT ID: Sherri Gibson GENDER: female DOB: 31-Dec-1960, MRN: [id number]   HPI  Chief Complaint  Patient presents with   Follow-up    COPD, nephews had rsv and she was able to over due to using inhalers    Reason for Visit: Follow-up  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 Lake Bells patient, last seen September 2019.  She was initially diagnosed with COPD in 2010 and has been on oxygen since 2017. She has been compliant with her Symbicort 160-4.5 mcg 2 puffs twice daily and Spiriva 2.5 mcg 2 puffs daily. She lives in a 3 story house and will need to wear her oxygen with it. She walks regularly on her treadmill.  She is planning for shoulder surgery. She reports her COPD is overall controlled. She is compliant with her Symbicort and Spiriva. She rarely use her albuterol. She is able to walk upstairs and around the house. Able to do  yardwork. No limitations in activity except when needing to carry things due to her shoulder issues. Denies wheezing or coughing. She wears her oxygen overnight but unsure if she actually drops. She will periodically check her oxygen with exertion and the lowest she sees it will go as low as 90%. It is never below 88%. Last COPD exacerbation November 2021 which was treated with steroids as outpatient.  11/25/20 Her husband is in hospice so she is unable to leave her home for medical appointments right now. Her family had RSV which led to her having productive cough, nasal congestion, wheezing and shortness of breath. Has improved with hot tea and inhalers. No longer having symptoms. She has purchased a Marine scientist and has been measuring her home O2 around 93%. Wears her oxygen nightly and with activity if it drops below 88%.   Social History: She smoked for many years, typically 2 packs per day from age [age].  She quit with Chantix in 2018. 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  Past Medical History:  Diagnosis Date   Anemia    as a child   Arthritis    Both Shoulders   Breast cancer ([place]) 8675   Left    Complication of anesthesia    states she had a hard time staying awake after hand surgery   COPD (chronic obstructive pulmonary disease) ([place])    uses O2 at 2l at night   High blood pressure  High cholesterol    History of kidney stones    Personal history of radiation therapy 2016   Left Breast Cancer   Pneumonia    as a child   Pre-diabetes    Prediabetes    Wears dentures    upper dentures     Outpatient Medications Prior to Visit  Medication Sig Dispense Refill   albuterol (PROVENTIL) (2.5 MG/3ML) 0.083% nebulizer solution Take 3 mLs (2.5 mg total) by nebulization every 6 (six) hours as needed for wheezing or shortness of breath. 50 mL 99   albuterol (VENTOLIN HFA) 108 (90 Base) MCG/ACT inhaler USE  2 INHALATIONS EVERY 4 TO 6 HOURS AS NEEDED (Patient taking differently: Inhale 2 puffs into the lungs every 4 (four) hours as needed for wheezing or shortness of breath.) 25.5 g 3   amLODipine (NORVASC) 10 MG tablet TAKE 1 TABLET DAILY (Patient taking differently: Take 10 mg by mouth every evening.) 90 tablet 3   aspirin EC 81 MG tablet Take 81 mg by mouth every evening. Swallow whole.     atorvastatin (LIPITOR) 40 MG tablet Take 1 tablet (40 mg total) by mouth every evening. 90 tablet 3   Calcium Carb-Cholecalciferol (CALCIUM 600-D PO) Take 1 tablet by mouth in the morning and at bedtime.     losartan (COZAAR) 50 MG tablet Take 1 tablet (50 mg total) by mouth daily. 90 tablet 0   metFORMIN (GLUCOPHAGE) 500 MG tablet Take 2 tablets (1,000 mg total) by mouth daily. 180 tablet 3   montelukast (SINGULAIR) 10 MG tablet TAKE 1 TABLET AT BEDTIME 90 tablet 3   Respiratory Therapy Supplies (NEBULIZER AIR TUBE/PLUGS) MISC As directed w/ nebulizer for Dx COPD 1 each 99   Respiratory Therapy Supplies (NEBULIZER) DEVI As directed for Dx COPD 1 each 99   SYMBICORT 160-4.5 MCG/ACT inhaler USE 2 INHALATIONS TWICE A DAY 30.6 g 3   Tiotropium Bromide Monohydrate (SPIRIVA RESPIMAT) 2.5 MCG/ACT AERS Inhale 1 each into the lungs as needed. 4 g 0   traZODone (DESYREL) 100 MG tablet Take 1-2 tablets (100-200 mg total) by mouth at bedtime as needed for sleep. 180 tablet 0   cyclobenzaprine (FLEXERIL) 10 MG tablet TAKE 1 TABLET THREE TIMES A DAY AS NEEDED FOR MUSCLE SPASMS (Patient not taking: Reported on 11/25/2020) 90 tablet 3   gabapentin (NEURONTIN) 300 MG capsule Take 1 capsule (300 mg total) by mouth 3 (three) times daily. (Patient not taking: Reported on 11/25/2020) 30 capsule 0   guaiFENesin-dextromethorphan (ROBITUSSIN DM) 100-10 MG/5ML syrup Take 10 mLs by mouth every 4 (four) hours as needed for cough. (Patient not taking: Reported on 11/25/2020) 236 mL 0   loratadine (CLARITIN) 10 MG tablet Take 10 mg by mouth  every evening. (Patient not taking: Reported on 11/25/2020)     Multiple Vitamins-Minerals (MULTIVITAMIN WITH MINERALS) tablet Take 1 tablet by mouth daily.     oxyCODONE-acetaminophen (PERCOCET) 5-325 MG tablet Take 1 tablet by mouth every 4 (four) hours as needed for severe pain. 30 tablet 0   OXYGEN Inhale 2 L into the lungs at bedtime.     pantoprazole (PROTONIX) 40 MG tablet Take 1 tablet (40 mg total) by mouth 2 (two) times daily. 180 tablet 3   predniSONE (DELTASONE) 20 MG tablet Take 1 tablet (20 mg total) by mouth 2 (two) times daily with a meal. (Patient not taking: No sig reported) 10 tablet 0   sodium chloride (OCEAN) 0.65 % SOLN nasal spray Place 1 spray into both nostrils  daily as needed (Dry nose). (Patient not taking: Reported on 11/25/2020)     No facility-administered medications prior to visit.    Review of Systems  Constitutional:  Negative for chills, diaphoresis, fever, malaise/fatigue and weight loss.  HENT:  Negative for congestion.   Respiratory:  Negative for cough, hemoptysis, sputum production, shortness of breath and wheezing.   Cardiovascular:  Negative for chest pain, palpitations and leg swelling.  Objective:   There were no vitals filed for this visit. SpO2 93%    Physical Exam: General: Well-appearing, no acute distress HENT: [place], AT Eyes: EOMI, no scleral icterus Respiratory: No respiratory distress. No audible wheezing on video Neuro: AAO x4, CNII-XII grossly intact Psych: Normal mood, normal affect   Data Reviewed:  Imaging: 10/2015 CT chest [facility] multiple calcified granulomatous largest is 8 mm, 2 noncalcified 3 mm left lower lobe nodules 11/2016 CT chest 6-48m nodules Right upper an lower nodules ([place]   04/13/19 CT Lung Screen - Bilateral calcified granulomas bilaterally. Several noncalcified tiny nodules with largest 374m unchanged. Centrilobular  emphysema 05/16/20 CT Lung Screen -Unchanged bilateral small calcified and uncalcified nodules <55m64mCentrilobular emphysema  PFT: [facility] FEV1 1.03 L 45% predicted, no air trapping, diffusion capacity 35% predicted     Assessment & Plan:   Discussion: [age]ar old female with severe COPD with emphysema, subcentimeter lung nodules and hx stage I IDC of the left breast with recurrence s/p lumpectomy and radiation who presents for follow-up. Last COPD exacerbation in 10/2020 however was not seen by health care team. Symptoms self resolved. We discussed compliance with bronchodilators, oxygen and management of exacerbations in the future.  Severe COPD - well-controlled REFILLED Symbicort 160-4.5 mcg 2 puffs twice daily.  REFILLED Spiriva  2.5 mcg 2 puffs daily.  Encourage regular aerobic activity   Chronic hypoxemic respiratory failure - stable CONTINUE 2L O2 via [place] with activity and sleep   Multiple lung nodules - stable Annual lung screen   Health Maintenance Immunization History  Administered Date(s) Administered   Influenza, Seasonal, Injecte, Preservative Fre 10/15/2016   Influenza,inj,Quad PF,6+ Mos 09/23/2017, 09/16/2018, 10/14/2019, 10/04/2020   PFIZER(Purple Top)SARS-COV-2 Vaccination 04/14/2019, 05/05/2019, 12/05/2019   Pneumococcal Polysaccharide-23 05/29/2017   Tdap 05/29/2017   Zoster Recombinat (Shingrix) 04/08/2020, 08/13/2020   CT Lung Screen - Due 04/2021  No orders of the defined types were placed in this encounter.  Meds ordered this encounter  Medications   Tiotropium Bromide Monohydrate (SPIRIVA RESPIMAT) 2.5 MCG/ACT AERS    Sig: Inhale 1 each into the lungs as needed.    Dispense:  12 g    Refill:  3   budesonide-formoterol (SYMBICORT) 160-4.5 MCG/ACT inhaler    Sig: Inhale 2 puffs into the lungs in the morning and at bedtime.    Dispense:  30.6 g    Refill:  3   albuterol (VENTOLIN HFA) 108 (90 Base) MCG/ACT  inhaler    Sig: Inhale 2 puffs into the lungs every 4 (four) hours as needed for wheezing or shortness of breath.    Dispense:  18 g    Refill:  4   Return in about 6 months (around 05/26/2021).  [facility] 11/25/2020 12:19 PM  Office Number [phone]

## 2020-11-25 NOTE — Patient Instructions (Signed)
  Severe COPD - well-controlled REFILLED Symbicort 160-4.5 mcg 2 puffs twice daily.  REFILLED Spiriva  2.5 mcg 2 puffs daily.  Encourage regular aerobic activity   Chronic hypoxemic respiratory failure - stable CONTINUE 2L O2 via [place] with activity and sleep  Follow-up with me in 6 months in-person. Ok to change to video visit if requested

## 2020-11-28 ENCOUNTER — Encounter: Payer: Self-pay | Admitting: Pulmonary Disease

## 2020-12-07 ENCOUNTER — Ambulatory Visit (INDEPENDENT_AMBULATORY_CARE_PROVIDER_SITE_OTHER): Payer: BC Managed Care – PPO | Admitting: Orthopedic Surgery

## 2020-12-07 ENCOUNTER — Other Ambulatory Visit: Payer: Self-pay

## 2020-12-07 DIAGNOSIS — Z96611 Presence of right artificial shoulder joint: Secondary | ICD-10-CM

## 2020-12-10 ENCOUNTER — Encounter: Payer: Self-pay | Admitting: Orthopedic Surgery

## 2020-12-10 NOTE — Progress Notes (Signed)
Post-Op Visit Note   Patient: Sherri Gibson           Date of Birth: 05-19-1960           MRN: [id number] Visit Date: 12/07/2020 PCP: Emeterio Reeve, DO   Assessment & Plan:  Chief Complaint:  Chief Complaint  Patient presents with   Right Shoulder - Routine Post Op    right reverse shoulder arthroplasty on 09/13/2020   Visit Diagnoses:  1. History of arthroplasty of right shoulder     Plan: Sherri Gibson is a 60 year old patient underwent right reverse shoulder replacement 09/13/2020.  She is 3 months out.  Husband passed away last week from cancer.  On exam she has range of motion passively of 60/90/140.  She was not able to be physical therapy because of her husband.  Plan at this time is to continue with range of motion exercises and strengthening but not lifting more than 15 pounds.  Overall her shoulder looks pretty good for not doing any supervised therapy.  Follow-Up Instructions: No follow-ups on file.   Orders:  No orders of the defined types were placed in this encounter.  No orders of the defined types were placed in this encounter.   Imaging: No results found.  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   Encounter for screening for lung cancer 04/13/2019   Primary osteoarthritis of both shoulders 03/06/2019   Chronic hypoxemic respiratory failure ([place]) 12/22/2018   Osteopenia 12/20/2017   Prediabetes 09/23/2017   Colonoscopy refused 05/29/2017   COPD, severe ([place]) 05/29/2017   Multiple pulmonary nodules 05/29/2017   Trigger thumb, right  thumb 05/29/2017   Primary osteoarthritis of first carpometacarpal joint of left hand 05/29/2017   Kidney lesion 12/14/2016   Erythrocytosis 11/13/2016   Breast cancer ([place]) 09/20/2016   Essential hypertension 09/20/2016   Insomnia 09/20/2016   Mixed hyperlipidemia 09/20/2016   Past Medical History:  Diagnosis Date   Anemia    as a child   Arthritis    Both Shoulders   Breast cancer ([place]) 0973   Left    Complication of anesthesia    states she had a hard time staying awake after hand surgery   COPD (chronic obstructive pulmonary disease) ([place])    uses O2 at 2l at night   High blood pressure    High cholesterol    History of kidney stones    Personal history of radiation therapy 2016   Left Breast Cancer   Pneumonia    as a child   Pre-diabetes    Prediabetes    Wears dentures    upper dentures    Family History  Problem Relation Age of Onset   Breast cancer Mother    Diabetes Brother    Colon cancer Neg Hx    Esophageal cancer Neg Hx     Past Surgical History:  Procedure Laterality Date   BIOPSY  07/11/2020   Procedure: BIOPSY;  Surgeon: Lavena Bullion, DO;  Location: [facility];  Service: Gastroenterology;;   BIOPSY  08/11/2020  Procedure: BIOPSY;  Surgeon: Milus Banister, MD;  Location: [facility];  Service: Endoscopy;;   BREAST BIOPSY     BREAST LUMPECTOMY Left 2021   BREAST LUMPECTOMY WITH AXILLARY LYMPH NODE BIOPSY  2016   COLONOSCOPY  2017   [facility]. think there was colon polyps   COLONOSCOPY WITH PROPOFOL N/A 07/11/2020   Procedure: COLONOSCOPY WITH PROPOFOL;  Surgeon: Lavena Bullion, DO;  Location: [facility];  Service: Gastroenterology;  Laterality: N/A;   ESOPHAGOGASTRODUODENOSCOPY (EGD) WITH PROPOFOL N/A 07/11/2020   Procedure: ESOPHAGOGASTRODUODENOSCOPY (EGD) WITH PROPOFOL;  Surgeon: Lavena Bullion, DO;  Location: [facility];  Service: Gastroenterology;  Laterality: N/A;    ESOPHAGOGASTRODUODENOSCOPY (EGD) WITH PROPOFOL N/A 08/11/2020   Procedure: ESOPHAGOGASTRODUODENOSCOPY (EGD) WITH PROPOFOL;  Surgeon: Milus Banister, MD;  Location: [facility];  Service: Endoscopy;  Laterality: N/A;   EUS N/A 08/11/2020   Procedure: UPPER ENDOSCOPIC ULTRASOUND (EUS) RADIAL;  Surgeon: Milus Banister, MD;  Location: [facility];  Service: Endoscopy;  Laterality: N/A;   HAND SURGERY Left    PARTIAL HYSTERECTOMY     SAVORY DILATION N/A 07/11/2020   Procedure: SAVORY DILATION;  Surgeon: Lavena Bullion, DO;  Location: [facility];  Service: Gastroenterology;  Laterality: N/A;   TOTAL SHOULDER ARTHROPLASTY Left 04/12/2020   Procedure: LEFT REVERSE TOTAL SHOULDER ARTHROPLASTY;  Surgeon: Meredith Pel, MD;  Location: [facility];  Service: Orthopedics;  Laterality: Left;   TOTAL SHOULDER ARTHROPLASTY Right 09/13/2020   Procedure: RIGHT TOTAL SHOULDER ARTHROPLASTY;  Surgeon: Meredith Pel, MD;  Location: [facility];  Service: Orthopedics;  Laterality: Right;   Social History   Occupational History   Not on file  Tobacco Use   Smoking status: Former    Packs/day: 2.50    Years: 44.00    Pack years: 110.00    Types: Cigarettes    Quit date: 11/29/2016    Years since quitting: 4.0   Smokeless tobacco: Never  Vaping Use   Vaping Use: Never used  Substance and Sexual Activity   Alcohol use: Not Currently   Drug use: Never   Sexual activity: Yes    Partners: Male    Birth control/protection: None

## 2020-12-14 ENCOUNTER — Other Ambulatory Visit: Payer: Self-pay

## 2020-12-14 ENCOUNTER — Inpatient Hospital Stay: Payer: BC Managed Care – PPO | Attending: Hematology & Oncology

## 2020-12-14 ENCOUNTER — Encounter: Payer: Self-pay | Admitting: Family

## 2020-12-14 ENCOUNTER — Inpatient Hospital Stay: Payer: BC Managed Care – PPO | Admitting: Family

## 2020-12-14 VITALS — BP 116/77 | HR 90 | Temp 99.0°F | Resp 18 | Ht 59.06 in | Wt 120.0 lb

## 2020-12-14 DIAGNOSIS — Z853 Personal history of malignant neoplasm of breast: Secondary | ICD-10-CM | POA: Diagnosis not present

## 2020-12-14 DIAGNOSIS — C50912 Malignant neoplasm of unspecified site of left female breast: Secondary | ICD-10-CM

## 2020-12-14 DIAGNOSIS — Z170 Estrogen receptor positive status [ER+]: Secondary | ICD-10-CM

## 2020-12-14 LAB — CMP (CANCER CENTER ONLY)
ALT: 15 U/L (ref 0–44)
AST: 19 U/L (ref 15–41)
Albumin: 4.3 g/dL (ref 3.5–5.0)
Alkaline Phosphatase: 81 U/L (ref 38–126)
Anion gap: 7 (ref 5–15)
BUN: 16 mg/dL (ref 6–20)
CO2: 31 mmol/L (ref 22–32)
Calcium: 9.7 mg/dL (ref 8.9–10.3)
Chloride: 101 mmol/L (ref 98–111)
Creatinine: 0.89 mg/dL (ref 0.44–1.00)
GFR, Estimated: >60 mL/min (ref >60)
Glucose, Bld: 182 mg/dL — ABNORMAL HIGH (ref 70–99)
Potassium: 4.8 mmol/L (ref 3.5–5.1)
Sodium: 139 mmol/L (ref 135–145)
Total Bilirubin: 0.3 mg/dL (ref 0.3–1.2)
Total Protein: 7.2 g/dL (ref 6.5–8.1)

## 2020-12-14 LAB — CBC WITH DIFFERENTIAL (CANCER CENTER ONLY)
Abs Immature Granulocytes: 0.01 10*3/uL (ref 0.00–0.07)
Basophils Absolute: 0 10*3/uL (ref 0.0–0.1)
Basophils Relative: 0 %
Eosinophils Absolute: 0.3 10*3/uL (ref 0.0–0.5)
Eosinophils Relative: 4 %
HCT: 40.2 % (ref 36.0–46.0)
Hemoglobin: 12.5 g/dL (ref 12.0–15.0)
Immature Granulocytes: 0 %
Lymphocytes Relative: 21 %
Lymphs Abs: 1.7 10*3/uL (ref 0.7–4.0)
MCH: 26.3 pg (ref 26.0–34.0)
MCHC: 31.1 g/dL (ref 30.0–36.0)
MCV: 84.6 fL (ref 80.0–100.0)
Monocytes Absolute: 0.5 10*3/uL (ref 0.1–1.0)
Monocytes Relative: 7 %
Neutro Abs: 5.4 10*3/uL (ref 1.7–7.7)
Neutrophils Relative %: 68 %
Platelet Count: 360 10*3/uL (ref 150–400)
RBC: 4.75 MIL/uL (ref 3.87–5.11)
RDW: 14.6 % (ref 11.5–15.5)
WBC Count: 8 10*3/uL (ref 4.0–10.5)
nRBC: 0 % (ref 0.0–0.2)

## 2020-12-14 NOTE — Progress Notes (Signed)
Hematology and Oncology Follow Up Visit  Sherri Gibson [id number] 1960/03/24 60 y.o. 12/14/2020   Principle Diagnosis:  1. Stage I (pT1pN0Mx) IDC of the left breast, ER/PR+, HER2- -04/2014: bx-proven left breast IDC, 44m, ER 90%, PR 50%, HER2-; Grade 1, 275minvasive disease, Ki-67 < 5%  -S/p left lumpectomy w/ adjuvant RT    2. 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 Unfortunately, her precious husband passed away earlier this month on 11/3. She is a little tearful during our visit. Thankfully, she has a good family support system that is checking in on her and has asked her to move closer to them.  She completed Arimidex in December 2021 and has done well. Breast exam today was unremarkable. No adenopathy or lymphedema noted on exam.   Mammogram in March 2022 and result was negative.  She notes fatigue and has not been sleeping well at night.  No fever, chills, n/v, cough, rash, dizziness, SOB, chest pain, palpitations, abdominal pain or changes in bowel or bladder habits.  She has occasional episodes of mild SOB with exacerbation of COPD. She takes a break to rest as needed.  Her appetite comes and goes. She is doing her best to stay well hydrated. Her weight is stable at 120 lbs.   ECOG Performance Status: 1 - Symptomatic but completely ambulatory  Medications:  Allergies as of 12/14/2020       Reactions   Codeine Itching   Feels like something crawling on her skin   Tetanus Toxoids Swelling   Patient stated,"I was given a Pneumonia shot at the same time I got the Tetanus shot, in the same arm."        Medication List        Accurate as of December 14, 2020  9:49 AM. If you have any questions, ask your nurse or doctor.          albuterol (2.5 MG/3ML) 0.083%  nebulizer solution Commonly known as: PROVENTIL Take 3 mLs (2.5 mg total) by nebulization every 6 (six) hours as needed for wheezing or shortness of breath.   albuterol 108 (90 Base) MCG/ACT inhaler Commonly known as: VENTOLIN HFA Inhale 2 puffs into the lungs every 4 (four) hours as needed for wheezing or shortness of breath.   amLODipine 10 MG tablet Commonly known as: NORVASC TAKE 1 TABLET DAILY What changed: when to take this   aspirin EC 81 MG tablet Take 81 mg by mouth every evening. Swallow whole.   atorvastatin 40 MG tablet Commonly known as: LIPITOR Take 1 tablet (40 mg total) by mouth every evening.   budesonide-formoterol 160-4.5 MCG/ACT inhaler Commonly known as: Symbicort Inhale 2 puffs into the lungs in the morning and at bedtime.   CALCIUM 600-D PO Take 1 tablet by mouth in the morning and at bedtime.   cyclobenzaprine 10 MG tablet Commonly known as: FLEXERIL TAKE 1 TABLET THREE TIMES A DAY AS NEEDED FOR MUSCLE SPASMS   gabapentin 300 MG capsule Commonly known as: Neurontin Take 1 capsule (300 mg total) by mouth 3 (three) times daily.   guaiFENesin-dextromethorphan 100-10 MG/5ML syrup Commonly known as: ROBITUSSIN DM Take 10 mLs by mouth every 4 (four) hours as needed for cough.   loratadine 10 MG tablet Commonly known as: CLARITIN  Take 10 mg by mouth every evening.   losartan 50 MG tablet Commonly known as: COZAAR Take 1 tablet (50 mg total) by mouth daily.   metFORMIN 500 MG tablet Commonly known as: GLUCOPHAGE Take 2 tablets (1,000 mg total) by mouth daily.   montelukast 10 MG tablet Commonly known as: SINGULAIR TAKE 1 TABLET AT BEDTIME   multivitamin with minerals tablet Take 1 tablet by mouth daily.   Nebulizer Devi As directed for Dx COPD   Nebulizer Air Tube/Plugs Misc As directed w/ nebulizer for Dx COPD   oxyCODONE-acetaminophen 5-325 MG tablet Commonly known as: Percocet Take 1 tablet by mouth every 4 (four) hours as needed  for severe pain.   OXYGEN Inhale 2 L into the lungs at bedtime.   pantoprazole 40 MG tablet Commonly known as: Protonix Take 1 tablet (40 mg total) by mouth 2 (two) times daily.   sodium chloride 0.65 % Soln nasal spray Commonly known as: OCEAN Place 1 spray into both nostrils daily as needed (Dry nose).   Spiriva Respimat 2.5 MCG/ACT Aers Generic drug: Tiotropium Bromide Monohydrate Inhale 1 each into the lungs as needed.   traZODone 100 MG tablet Commonly known as: DESYREL Take 1-2 tablets (100-200 mg total) by mouth at bedtime as needed for sleep.        Allergies:  Allergies  Allergen Reactions   Codeine Itching    Feels like something crawling on her skin    Tetanus Toxoids Swelling    Patient stated,"I was given a Pneumonia shot at the same time I got the Tetanus shot, in the same arm."    Past Medical History, Surgical history, Social history, and Family History were reviewed and updated.  Review of Systems: All other 10 point review of systems is negative.   Physical Exam:  height is 4' 11.06" (1.5 m) and weight is 120 lb (54.4 kg). Her oral temperature is 99 F (37.2 C). Her blood pressure is 116/77 and her pulse is 90. Her respiration is 18 and oxygen saturation is 99%.   Wt Readings from Last 3 Encounters:  12/14/20 120 lb (54.4 kg)  10/04/20 118 lb 0.6 oz (53.5 kg)  09/21/20 118 lb (53.5 kg)    Ocular: Sclerae unicteric, pupils equal, round and reactive to light Ear-nose-throat: Oropharynx clear, dentition fair Lymphatic: No cervical, supraclavicular or axillary adenopathy Lungs no rales or rhonchi, good excursion bilaterally Heart regular rate and rhythm, no murmur appreciated Abd soft, nontender, positive bowel sounds MSK no focal spinal tenderness, no joint edema Neuro: non-focal, well-oriented, appropriate affect Breasts: No changes on exam.  Lab Results  Component Value Date   WBC 8.0 12/14/2020   HGB 12.5 12/14/2020   HCT 40.2  12/14/2020   MCV 84.6 12/14/2020   PLT 360 12/14/2020   No results found for: FERRITIN, IRON, TIBC, UIBC, IRONPCTSAT Lab Results  Component Value Date   RBC 4.75 12/14/2020   No results found for: KPAFRELGTCHN, LAMBDASER, KAPLAMBRATIO No results found for: IGGSERUM, IGA, IGMSERUM No results found for: Odetta Pink, SPEI   Chemistry      Component Value Date/Time   NA 137 09/05/2020 0853   NA 139 10/09/2017 0000   K 4.4 09/05/2020 0853   CL 101 09/05/2020 0853   CL 103 10/09/2017 0000   CO2 28 09/05/2020 0853   CO2 22 10/09/2017 0000   BUN 14 09/05/2020 0853   BUN 17 10/09/2017 0000   CREATININE 0.81 09/05/2020 0853  CREATININE 0.86 03/03/2020 0000   GLU 119 10/09/2017 0000      Component Value Date/Time   CALCIUM 9.4 09/05/2020 0853   CALCIUM 9.2 10/09/2017 0000   ALKPHOS 79 12/18/2019 0917   AST 20 03/03/2020 0000   AST 16 12/18/2019 0917   ALT 14 03/03/2020 0000   ALT 13 12/18/2019 0917   BILITOT 0.5 03/03/2020 0000   BILITOT 0.4 12/18/2019 9833       Impression and Plan: Sherri Gibson is a very pleasant 60 yo caucasian female with history of 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 She started Arimidex in October 2016 and finished out her 5 years in December 2021.  She is doing fairly well and so far there has been no evidence of recurrence.  Her biggest issue at this time is working through the passing of her husband.  Follow-up in 1 year.  She can contact our office with any questions or concerns.   Lottie Dawson, NP 11/16/20229:49 AM

## 2020-12-15 ENCOUNTER — Telehealth: Payer: Self-pay | Admitting: *Deleted

## 2020-12-15 NOTE — Telephone Encounter (Signed)
Per 12/14/20 los - called and gave upcoming appointments - confirmed

## 2020-12-16 ENCOUNTER — Other Ambulatory Visit: Payer: BC Managed Care – PPO

## 2020-12-16 ENCOUNTER — Ambulatory Visit: Payer: BC Managed Care – PPO | Admitting: Hematology & Oncology

## 2020-12-19 LAB — HM DIABETES EYE EXAM

## 2020-12-26 DIAGNOSIS — J449 Chronic obstructive pulmonary disease, unspecified: Secondary | ICD-10-CM | POA: Diagnosis not present

## 2020-12-31 ENCOUNTER — Other Ambulatory Visit: Payer: Self-pay

## 2020-12-31 ENCOUNTER — Emergency Department (HOSPITAL_BASED_OUTPATIENT_CLINIC_OR_DEPARTMENT_OTHER)
Admission: EM | Admit: 2020-12-31 | Discharge: 2020-12-31 | Disposition: A | Discharge status: Home / Self Care | Payer: BC Managed Care – PPO | Attending: Emergency Medicine | Admitting: Emergency Medicine

## 2020-12-31 ENCOUNTER — Emergency Department (HOSPITAL_BASED_OUTPATIENT_CLINIC_OR_DEPARTMENT_OTHER): Payer: BC Managed Care – PPO

## 2020-12-31 ENCOUNTER — Encounter (HOSPITAL_BASED_OUTPATIENT_CLINIC_OR_DEPARTMENT_OTHER): Payer: Self-pay

## 2020-12-31 DIAGNOSIS — Z96612 Presence of left artificial shoulder joint: Secondary | ICD-10-CM | POA: Diagnosis not present

## 2020-12-31 DIAGNOSIS — Z20822 Contact with and (suspected) exposure to covid-19: Secondary | ICD-10-CM | POA: Diagnosis not present

## 2020-12-31 DIAGNOSIS — Z7982 Long term (current) use of aspirin: Secondary | ICD-10-CM | POA: Diagnosis not present

## 2020-12-31 DIAGNOSIS — J441 Chronic obstructive pulmonary disease with (acute) exacerbation: Secondary | ICD-10-CM | POA: Diagnosis not present

## 2020-12-31 DIAGNOSIS — Z79899 Other long term (current) drug therapy: Secondary | ICD-10-CM | POA: Insufficient documentation

## 2020-12-31 DIAGNOSIS — Z853 Personal history of malignant neoplasm of breast: Secondary | ICD-10-CM | POA: Diagnosis not present

## 2020-12-31 DIAGNOSIS — Z87891 Personal history of nicotine dependence: Secondary | ICD-10-CM | POA: Diagnosis not present

## 2020-12-31 DIAGNOSIS — Z96611 Presence of right artificial shoulder joint: Secondary | ICD-10-CM | POA: Insufficient documentation

## 2020-12-31 DIAGNOSIS — R0602 Shortness of breath: Secondary | ICD-10-CM | POA: Diagnosis present

## 2020-12-31 DIAGNOSIS — Z7984 Long term (current) use of oral hypoglycemic drugs: Secondary | ICD-10-CM | POA: Insufficient documentation

## 2020-12-31 DIAGNOSIS — I10 Essential (primary) hypertension: Secondary | ICD-10-CM | POA: Diagnosis not present

## 2020-12-31 LAB — COMPREHENSIVE METABOLIC PANEL
ALT: 23 U/L (ref 0–44)
AST: 24 U/L (ref 15–41)
Albumin: 3.6 g/dL (ref 3.5–5.0)
Alkaline Phosphatase: 86 U/L (ref 38–126)
Anion gap: 10 (ref 5–15)
BUN: 9 mg/dL (ref 6–20)
CO2: 26 mmol/L (ref 22–32)
Calcium: 9 mg/dL (ref 8.9–10.3)
Chloride: 99 mmol/L (ref 98–111)
Creatinine, Ser: 0.62 mg/dL (ref 0.44–1.00)
GFR, Estimated: >60 mL/min (ref >60)
Glucose, Bld: 186 mg/dL — ABNORMAL HIGH (ref 70–99)
Potassium: 3.8 mmol/L (ref 3.5–5.1)
Sodium: 135 mmol/L (ref 135–145)
Total Bilirubin: 0.6 mg/dL (ref 0.3–1.2)
Total Protein: 7.8 g/dL (ref 6.5–8.1)

## 2020-12-31 LAB — CBC WITH DIFFERENTIAL/PLATELET
Abs Immature Granulocytes: 0.05 10*3/uL (ref 0.00–0.07)
Basophils Absolute: 0 10*3/uL (ref 0.0–0.1)
Basophils Relative: 0 %
Eosinophils Absolute: 0.1 10*3/uL (ref 0.0–0.5)
Eosinophils Relative: 1 %
HCT: 36.7 % (ref 36.0–46.0)
Hemoglobin: 11.6 g/dL — ABNORMAL LOW (ref 12.0–15.0)
Immature Granulocytes: 0 %
Lymphocytes Relative: 8 %
Lymphs Abs: 1 10*3/uL (ref 0.7–4.0)
MCH: 25.8 pg — ABNORMAL LOW (ref 26.0–34.0)
MCHC: 31.6 g/dL (ref 30.0–36.0)
MCV: 81.6 fL (ref 80.0–100.0)
Monocytes Absolute: 1.3 10*3/uL — ABNORMAL HIGH (ref 0.1–1.0)
Monocytes Relative: 11 %
Neutro Abs: 10 10*3/uL — ABNORMAL HIGH (ref 1.7–7.7)
Neutrophils Relative %: 80 %
Platelets: 433 10*3/uL — ABNORMAL HIGH (ref 150–400)
RBC: 4.5 MIL/uL (ref 3.87–5.11)
RDW: 15.1 % (ref 11.5–15.5)
WBC: 12.6 10*3/uL — ABNORMAL HIGH (ref 4.0–10.5)
nRBC: 0 % (ref 0.0–0.2)

## 2020-12-31 LAB — RESP PANEL BY RT-PCR (FLU A&B, COVID) ARPGX2
Influenza A by PCR: NEGATIVE
Influenza B by PCR: NEGATIVE
SARS Coronavirus 2 by RT PCR: NEGATIVE

## 2020-12-31 MED ORDER — IPRATROPIUM-ALBUTEROL 0.5-2.5 (3) MG/3ML IN SOLN
3.0000 mL | Freq: Once | RESPIRATORY_TRACT | Status: AC
  Administered 2020-12-31: 3 mL via RESPIRATORY_TRACT
  Filled 2020-12-31: qty 3

## 2020-12-31 MED ORDER — METHYLPREDNISOLONE SODIUM SUCC 125 MG IJ SOLR
125.0000 mg | Freq: Once | INTRAMUSCULAR | Status: AC
  Administered 2020-12-31: 125 mg via INTRAVENOUS
  Filled 2020-12-31: qty 2

## 2020-12-31 MED ORDER — PREDNISONE 20 MG PO TABS: 60.0000 mg | ORAL_TABLET | Freq: Every day | ORAL | 0 refills | Status: AC

## 2020-12-31 NOTE — Discharge Instructions (Signed)
Continue to wear your home oxygen at least at 2 L.  Take your next dose of steroid, prednisone tomorrow.  Recommend using your nebulizer at least every 4 hours for the next 24 hours and then as needed.  As discussed if you feel like you are not getting much relief and or having worsening symptoms or increasing work of breathing or increasing oxygen need please return for reevaluation.

## 2020-12-31 NOTE — ED Notes (Signed)
Unsuccessful IV attempt, labs collected. RN Vincente Liberty updated

## 2020-12-31 NOTE — ED Triage Notes (Signed)
Pt c/o shortness of breath & cough since yesterday. States SpO2 was 79% at home, put on home O2. 87% on RA at arrival. Placed on 2L.

## 2020-12-31 NOTE — ED Notes (Signed)
PT ambulated to bathroom without O2  O2 82% HR 115 PT was put back on 2L and is 96%

## 2020-12-31 NOTE — ED Provider Notes (Signed)
[facility] Provider Note   CSN: [id number] Arrival date & time: 12/31/20  1112     History Chief Complaint  Patient presents with   Shortness of Breath    Sherri Gibson is a 60 y.o. female.  The history is provided by the patient.  Shortness of Breath Severity:  Moderate Onset quality:  Gradual Duration:  3 days Timing:  Constant Progression:  Worsening Chronicity:  New Context: activity and URI   Relieved by:  Oxygen and inhaler Worsened by:  Exertion Associated symptoms: cough and wheezing   Associated symptoms: no abdominal pain, no chest pain, no claudication, no diaphoresis, no ear pain, no fever, no rash, no sore throat and no vomiting   Risk factors comment:  COPD     Past Medical History:  Diagnosis Date   Anemia    as a child   Arthritis    Both Shoulders   Breast cancer ([place]) 1696   Left    Complication of anesthesia    states she had a hard time staying awake after hand surgery   COPD (chronic obstructive pulmonary disease) ([place])    uses O2 at 2l at night   High blood pressure    High cholesterol    History of kidney stones    Personal history of radiation therapy 2016   Left Breast Cancer   Pneumonia    as a child   Pre-diabetes    Prediabetes    Wears dentures    upper dentures    Patient Active Problem List   Diagnosis Date Noted   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   Arthritis of right shoulder region    Left wrist tendonitis 08/12/2020   Dysphagia    Gastroesophageal reflux disease with esophagitis without hemorrhage    Gastritis and gastroduodenitis    Gastric nodule    Colon cancer screening    Arthritis of left shoulder region    S/P reverse total shoulder arthroplasty, 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   Encounter for screening for lung cancer 04/13/2019   Primary osteoarthritis of both shoulders 03/06/2019   Chronic hypoxemic respiratory failure ([place]) 12/22/2018   Osteopenia  12/20/2017   Prediabetes 09/23/2017   Colonoscopy refused 05/29/2017   COPD, severe ([place]) 05/29/2017   Multiple pulmonary nodules 05/29/2017   Trigger thumb, right thumb 05/29/2017   Primary osteoarthritis of first carpometacarpal joint of left hand 05/29/2017   Kidney lesion 12/14/2016   Erythrocytosis 11/13/2016   Breast cancer ([place]) 09/20/2016   Essential hypertension 09/20/2016   Insomnia 09/20/2016   Mixed hyperlipidemia 09/20/2016    Past Surgical History:  Procedure Laterality Date   BIOPSY  07/11/2020   Procedure: BIOPSY;  Surgeon: Lavena Bullion, DO;  Location: [facility];  Service: Gastroenterology;;   BIOPSY  08/11/2020   Procedure: BIOPSY;  Surgeon: Milus Banister, MD;  Location: [facility];  Service: Endoscopy;;   BREAST BIOPSY     BREAST LUMPECTOMY Left 2021   BREAST LUMPECTOMY WITH AXILLARY LYMPH NODE BIOPSY  2016   COLONOSCOPY  2017   [facility]. think there was colon polyps   COLONOSCOPY WITH PROPOFOL N/A 07/11/2020   Procedure: COLONOSCOPY WITH PROPOFOL;  Surgeon: Lavena Bullion, DO;  Location: [facility];  Service: Gastroenterology;  Laterality: N/A;   ESOPHAGOGASTRODUODENOSCOPY (EGD) WITH PROPOFOL N/A 07/11/2020   Procedure: ESOPHAGOGASTRODUODENOSCOPY (EGD) WITH PROPOFOL;  Surgeon: Lavena Bullion, DO;  Location: [facility];  Service: Gastroenterology;  Laterality: N/A;  ESOPHAGOGASTRODUODENOSCOPY (EGD) WITH PROPOFOL N/A 08/11/2020   Procedure: ESOPHAGOGASTRODUODENOSCOPY (EGD) WITH PROPOFOL;  Surgeon: Milus Banister, MD;  Location: [facility];  Service: Endoscopy;  Laterality: N/A;   EUS N/A 08/11/2020   Procedure: UPPER ENDOSCOPIC ULTRASOUND (EUS) RADIAL;  Surgeon: Milus Banister, MD;  Location: [facility];  Service: Endoscopy;  Laterality: N/A;   HAND SURGERY Left    PARTIAL HYSTERECTOMY     SAVORY DILATION N/A 07/11/2020   Procedure: SAVORY DILATION;  Surgeon: Lavena Bullion, DO;  Location:  [facility];  Service: Gastroenterology;  Laterality: N/A;   TOTAL SHOULDER ARTHROPLASTY Left 04/12/2020   Procedure: LEFT REVERSE TOTAL SHOULDER ARTHROPLASTY;  Surgeon: Meredith Pel, MD;  Location: [place];  Service: Orthopedics;  Laterality: Left;   TOTAL SHOULDER ARTHROPLASTY Right 09/13/2020   Procedure: RIGHT TOTAL SHOULDER ARTHROPLASTY;  Surgeon: Meredith Pel, MD;  Location: [place];  Service: Orthopedics;  Laterality: Right;     OB History   No obstetric history on file.     Family History  Problem Relation Age of Onset   Breast cancer Mother    Diabetes Brother    Colon cancer Neg Hx    Esophageal cancer Neg Hx     Social History   Tobacco Use   Smoking status: Former    Packs/day: 2.50    Years: 44.00    Pack years: 110.00    Types: Cigarettes    Quit date: 11/29/2016    Years since quitting: 4.0   Smokeless tobacco: Never  Vaping Use   Vaping Use: Never used  Substance Use Topics   Alcohol use: Not Currently   Drug use: Never    Home Medications Prior to Admission medications   Medication Sig Start Date End Date Taking? Authorizing Provider  predniSONE (DELTASONE) 20 MG tablet Take 3 tablets (60 mg total) by mouth daily for 4 days. 12/31/20 01/04/21 Yes Rigo Letts, DO  albuterol (PROVENTIL) (2.5 MG/3ML) 0.083% nebulizer solution Take 3 mLs (2.5 mg total) by nebulization every 6 (six) hours as needed for wheezing or shortness of breath. 10/04/20   Emeterio Reeve, DO  albuterol (VENTOLIN HFA) 108 (90 Base) MCG/ACT inhaler Inhale 2 puffs into the lungs every 4 (four) hours as needed for wheezing or shortness of breath. 11/25/20   Margaretha Seeds, MD  amLODipine (NORVASC) 10 MG tablet TAKE 1 TABLET DAILY Patient taking differently: Take 10 mg by mouth every evening. 08/09/20   Emeterio Reeve, DO  aspirin EC 81 MG tablet Take 81 mg by mouth every evening. Swallow whole.    [provider]  atorvastatin (LIPITOR) 40 MG tablet Take 1 tablet  (40 mg total) by mouth every evening. 10/25/20   Silverio Decamp, MD  budesonide-formoterol [facility]) 160-4.5 MCG/ACT inhaler Inhale 2 puffs into the lungs in the morning and at bedtime. 11/25/20   Margaretha Seeds, MD  Calcium Carb-Cholecalciferol (CALCIUM 600-D PO) Take 1 tablet by mouth in the morning and at bedtime.    [provider]  cyclobenzaprine (FLEXERIL) 10 MG tablet TAKE 1 TABLET THREE TIMES A DAY AS NEEDED FOR MUSCLE SPASMS 02/15/20   Emeterio Reeve, DO  gabapentin (NEURONTIN) 300 MG capsule Take 1 capsule (300 mg total) by mouth 3 (three) times daily. Patient not taking: No sig reported 09/13/20 09/13/21  Magnant, Charles L, PA-C  guaiFENesin-dextromethorphan (ROBITUSSIN DM) 100-10 MG/5ML syrup Take 10 mLs by mouth every 4 (four) hours as needed for cough. Patient not taking: No sig reported 09/21/20  Emeterio Reeve, DO  loratadine (CLARITIN) 10 MG tablet Take 10 mg by mouth every evening. Patient not taking: No sig reported    [provider]  losartan (COZAAR) 50 MG tablet Take 1 tablet (50 mg total) by mouth daily. 10/19/20   Emeterio Reeve, DO  metFORMIN (GLUCOPHAGE) 500 MG tablet Take 2 tablets (1,000 mg total) by mouth daily. 10/04/20   Emeterio Reeve, DO  montelukast (SINGULAIR) 10 MG tablet TAKE 1 TABLET AT BEDTIME 10/07/20   Emeterio Reeve, DO  Multiple Vitamins-Minerals (MULTIVITAMIN WITH MINERALS) tablet Take 1 tablet by mouth daily.    [provider]  oxyCODONE-acetaminophen (PERCOCET) 5-325 MG tablet Take 1 tablet by mouth every 4 (four) hours as needed for severe pain. 09/13/20 09/13/21  Magnant, Charles L, PA-C  OXYGEN Inhale 2 L into the lungs at bedtime.    [provider]  pantoprazole (PROTONIX) 40 MG tablet Take 1 tablet (40 mg total) by mouth 2 (two) times daily. 08/02/20 08/02/21  Cirigliano, Dominic Pea, DO  Respiratory Therapy Supplies (NEBULIZER AIR TUBE/PLUGS) MISC As directed w/ nebulizer for Dx COPD 10/04/20    Emeterio Reeve, DO  Respiratory Therapy Supplies (NEBULIZER) DEVI As directed for Dx COPD 10/04/20   Emeterio Reeve, DO  sodium chloride (OCEAN) 0.65 % SOLN nasal spray Place 1 spray into both nostrils daily as needed (Dry nose).    [provider]  Tiotropium Bromide Monohydrate (SPIRIVA RESPIMAT) 2.5 MCG/ACT AERS Inhale 1 each into the lungs as needed. 11/25/20   Margaretha Seeds, MD  traZODone (DESYREL) 100 MG tablet Take 1-2 tablets (100-200 mg total) by mouth at bedtime as needed for sleep. 10/31/20   Luetta Nutting, DO    Allergies    Codeine and Tetanus toxoids  Review of Systems   Review of Systems  Constitutional:  Negative for chills, diaphoresis and fever.  HENT:  Positive for congestion. Negative for ear pain and sore throat.   Eyes:  Negative for pain and visual disturbance.  Respiratory:  Positive for cough, shortness of breath and wheezing.   Cardiovascular:  Negative for chest pain, palpitations and claudication.  Gastrointestinal:  Negative for abdominal pain and vomiting.  Genitourinary:  Negative for dysuria and hematuria.  Musculoskeletal:  Negative for arthralgias and back pain.  Skin:  Negative for color change and rash.  Neurological:  Negative for seizures and syncope.  All other systems reviewed and are negative.  Physical Exam Updated Vital Signs BP 125/72   Pulse (!) 105   Temp 99.1 F (37.3 C) (Oral)   Resp 18   Ht 4\' 11"  (1.499 m)   Wt 54.2 kg   SpO2 93%   BMI 24.12 kg/m   Physical Exam Vitals and nursing note reviewed.  Constitutional:      General: She is not in acute distress.    Appearance: She is well-developed.  HENT:     Head: Normocephalic and atraumatic.     Mouth/Throat:     Mouth: Mucous membranes are moist.  Eyes:     Conjunctiva/sclera: Conjunctivae normal.  Cardiovascular:     Rate and Rhythm: Normal rate and regular rhythm.     Pulses: Normal pulses.     Heart sounds: Normal heart sounds. No murmur  heard. Pulmonary:     Effort: Tachypnea present.     Breath sounds: Decreased breath sounds present.  Abdominal:     Palpations: Abdomen is soft.     Tenderness: There is no abdominal tenderness.  Musculoskeletal:  General: No swelling. Normal range of motion.     Cervical back: Normal range of motion and neck supple.     Right lower leg: No edema.     Left lower leg: No edema.  Skin:    General: Skin is warm and dry.     Capillary Refill: Capillary refill takes less than 2 seconds.  Neurological:     General: No focal deficit present.     Mental Status: She is alert.  Psychiatric:        Mood and Affect: Mood normal.    ED Results / Procedures / Treatments   Labs (all labs ordered are listed, but only abnormal results are displayed) Labs Reviewed  CBC WITH DIFFERENTIAL/PLATELET - Abnormal; Notable for the following components:      Result Value   WBC 12.6 (*)    Hemoglobin 11.6 (*)    MCH 25.8 (*)    Platelets 433 (*)    Neutro Abs 10.0 (*)    Monocytes Absolute 1.3 (*)    All other components within normal limits  COMPREHENSIVE METABOLIC PANEL - Abnormal; Notable for the following components:   Glucose, Bld 186 (*)    All other components within normal limits  RESP PANEL BY RT-PCR (FLU A&B, COVID) [id number]    EKG EKG Interpretation  Date/Time:  Saturday December 31 2020 11:28:58 EST Ventricular Rate:  106 PR Interval:  134 QRS Duration: 81 QT Interval:  322 QTC Calculation: 428 R Axis:   65 Text Interpretation: Sinus tachycardia Low voltage, extremity and precordial leads Consider anterior infarct Confirmed by Lennice Sites ([id number]) on 12/31/2020 11:40:58 AM  Radiology DG Chest Portable 1 View  Result Date: 12/31/2020 CLINICAL DATA:  Cough, shortness of breath EXAM: PORTABLE CHEST 1 VIEW COMPARISON:  Chest x-ray dated 10/04/2020 FINDINGS: Heart size and mediastinal contours are within normal limits. Lungs are clear. No pleural effusion or pneumothorax is  seen. IMPRESSION: No active disease. No evidence of pneumonia or pulmonary edema. Electronically Signed   By: Franki Cabot M.D.   On: 12/31/2020 11:28    Procedures Procedures   Medications Ordered in ED Medications  ipratropium-albuterol (DUONEB) 0.5-2.5 (3) MG/3ML nebulizer solution 3 mL (3 mLs Nebulization Given 12/31/20 1136)  methylPREDNISolone sodium succinate (SOLU-MEDROL) 125 mg/2 mL injection 125 mg (125 mg Intravenous Given 12/31/20 1145)    ED Course  I have reviewed the triage vital signs and the nursing notes.  Pertinent labs & imaging results that were available during my care of the patient were reviewed by me and considered in my medical decision making (see chart for details).    MDM Rules/Calculators/A&P                           Mellina A Biever is a 60 year old female with history of high blood pressure, high cholesterol, COPD who wears oxygen at night who presents to the ED with cough, shortness of breath, hypoxia.  Patient with overall unremarkable vitals but is requiring 2 L of oxygen currently.  She states that while walking at home she has been getting severely short of breath with coughing.  She is now having to wear oxygen at home at all times.  She has very decreased breath sounds throughout with poor air movement.  She is tachypneic.  But she feels more comfortable on 2 L of oxygen currently.  She has been feeling sick for the last 3 days with cough.  She had a  negative COVID test 2 days ago at home.  She states may be some weather exposure being out in the cold over the weekend might of triggered this.  She denies any chest pain.  Her temperature is 99.1 here.  We will get chest x-ray.  Viral swab.  EKG shows sinus tachycardia.  No ischemic changes.  Doubt cardiac process.  Patient negative for flu and COVID.  Chest x-ray negative for pneumonia.  Work of breathing much improved after breathing treatment and steroids.  She feels much more comfortable.  She will  ambulate to see how she feels.  She has oxygen at home and is on 2 L currently.  Shared decision about staying for observation versus discharge and patient prefers discharge which I think is reasonable.  Patient felt better with ambulation.  Due to miscommunication she was ambulated without oxygen.  She did desat but she quickly improved when she got back to the room on oxygen.  She actually did not feel that bad walking around despite not having her oxygen on.  At this time she feels comfortable with discharge.  She has oxygen at home.  She has plenty of inhalers and nebulizer solution at home as well.  We will prescribe her prednisone.  She understands return precautions and discharge.  This chart was dictated using voice recognition software.  Despite best efforts to proofread,  errors can occur which can change the documentation meaning.   Final Clinical Impression(s) / ED Diagnoses Final diagnoses:  COPD exacerbation ([place])    Rx / DC Orders ED Discharge Orders          Ordered    predniSONE (DELTASONE) 20 MG tablet  Daily        12/31/20 [place], Quita Skye, DO 12/31/20 1330

## 2021-01-02 ENCOUNTER — Telehealth: Payer: Self-pay | Admitting: Family Medicine

## 2021-01-03 ENCOUNTER — Encounter: Payer: Self-pay | Admitting: Family Medicine

## 2021-01-03 ENCOUNTER — Other Ambulatory Visit: Payer: Self-pay

## 2021-01-03 ENCOUNTER — Ambulatory Visit: Payer: BC Managed Care – PPO | Admitting: Family Medicine

## 2021-01-03 VITALS — BP 125/75 | HR 107 | Ht 59.0 in | Wt 120.0 lb

## 2021-01-03 DIAGNOSIS — J449 Chronic obstructive pulmonary disease, unspecified: Secondary | ICD-10-CM

## 2021-01-03 DIAGNOSIS — J9611 Chronic respiratory failure with hypoxia: Secondary | ICD-10-CM

## 2021-01-03 DIAGNOSIS — I10 Essential (primary) hypertension: Secondary | ICD-10-CM | POA: Diagnosis not present

## 2021-01-03 DIAGNOSIS — E1169 Type 2 diabetes mellitus with other specified complication: Secondary | ICD-10-CM | POA: Diagnosis not present

## 2021-01-03 DIAGNOSIS — E782 Mixed hyperlipidemia: Secondary | ICD-10-CM | POA: Diagnosis not present

## 2021-01-03 DIAGNOSIS — E785 Hyperlipidemia, unspecified: Secondary | ICD-10-CM

## 2021-01-03 DIAGNOSIS — F5101 Primary insomnia: Secondary | ICD-10-CM

## 2021-01-03 LAB — POCT GLYCOSYLATED HEMOGLOBIN (HGB A1C): HbA1c, POC (controlled diabetic range): 6.7 % (ref 0.0–7.0)

## 2021-01-03 MED ORDER — HYDROCOD POLST-CPM POLST ER 10-8 MG/5ML PO SUER: 5.0000 mL | Freq: Two times a day (BID) | ORAL | 0 refills | Status: DC | PRN

## 2021-01-03 MED ORDER — ALBUTEROL SULFATE (2.5 MG/3ML) 0.083% IN NEBU: 2.5000 mg | INHALATION_SOLUTION | Freq: Four times a day (QID) | RESPIRATORY_TRACT | 99 refills | Status: DC | PRN

## 2021-01-03 MED ORDER — DOXEPIN HCL 3 MG PO TABS: ORAL_TABLET | ORAL | 3 refills | Status: DC

## 2021-01-03 MED ORDER — MONTELUKAST SODIUM 10 MG PO TABS: 10.0000 mg | ORAL_TABLET | Freq: Every day | ORAL | 3 refills | Status: DC

## 2021-01-03 MED ORDER — ATORVASTATIN CALCIUM 40 MG PO TABS: 40.0000 mg | ORAL_TABLET | Freq: Every evening | ORAL | 3 refills | Status: DC

## 2021-01-03 MED ORDER — LOSARTAN POTASSIUM 50 MG PO TABS: 50.0000 mg | ORAL_TABLET | Freq: Every day | ORAL | 3 refills | Status: DC

## 2021-01-03 MED ORDER — DOXYCYCLINE HYCLATE 100 MG PO TABS: 100.0000 mg | ORAL_TABLET | Freq: Two times a day (BID) | ORAL | 0 refills | Status: AC

## 2021-01-03 MED ORDER — AMLODIPINE BESYLATE 10 MG PO TABS: 10.0000 mg | ORAL_TABLET | Freq: Every day | ORAL | 3 refills | Status: DC

## 2021-01-03 NOTE — Assessment & Plan Note (Signed)
History of COPD with acute exacerbation.  She is currently on steroids.  I am adding a course of doxycycline as she has having increased sputum production.  Recommend continuation of albuterol as needed.  I am also adding Tussionex to use for cough.

## 2021-01-03 NOTE — Patient Instructions (Addendum)
Continue prednisone Add on doxycycline Use cough syrup as needed.   Stop trazodone.  Try doxepin for sleep.  Follow up in 4-6 weeks.

## 2021-01-03 NOTE — Assessment & Plan Note (Signed)
She is doing well with atorvastatin at this time.  Recommend continuation.

## 2021-01-03 NOTE — Progress Notes (Signed)
Sherri Gibson - 60 y.o. female MRN [id number]  Date of birth: [birth year]  Subjective Chief Complaint  Patient presents with   Diabetes    HPI Sherri Gibson is a 60 year old female here today for follow-up of recent ER visit.  She was seen a few days ago for COPD exacerbation.  She was started on steroids at that time.  She has been using her home oxygen as well as home nebulizer treatments.  She continues to have a harsh, productive cough and shortness of breath.  She has not had fever or chills.  COVID and flu testing were negative in the ED.  She also reports some increased difficulty with sleep.  She has had difficulty with sleep for several years however she recently lost her husband as well which has worsened her insomnia.  She has taking trazodone which helps her fall asleep however she often will wake up a couple hours later and has trouble falling back asleep.  She does have history of diabetes as well which has been fairly well controlled with metformin.  She reports that she has not been eating as well over the past few months since her husband passed away.  ROS:  A comprehensive ROS was completed and negative except as noted per HPI  Allergies  Allergen Reactions   Codeine Itching    Feels like something crawling on her skin    Tetanus Toxoids Swelling    Patient stated,"I was given a Pneumonia shot at the same time I got the Tetanus shot, in the same arm."    Past Medical History:  Diagnosis Date   Anemia    as a child   Arthritis    Both Shoulders   Breast cancer ([place]) 9233   Left    Complication of anesthesia    states she had a hard time staying awake after hand surgery   COPD (chronic obstructive pulmonary disease) ([place])    uses O2 at 2l at night   High blood pressure    High cholesterol    History of kidney stones    Personal history of radiation therapy 2016   Left Breast Cancer   Pneumonia    as a child   Pre-diabetes    Prediabetes    Wears dentures     upper dentures    Past Surgical History:  Procedure Laterality Date   BIOPSY  07/11/2020   Procedure: BIOPSY;  Surgeon: Lavena Bullion, DO;  Location: [facility];  Service: Gastroenterology;;   BIOPSY  08/11/2020   Procedure: BIOPSY;  Surgeon: Milus Banister, MD;  [facility]: [facility];  Service: Endoscopy;;   BREAST BIOPSY     BREAST LUMPECTOMY Left 2021   BREAST LUMPECTOMY WITH AXILLARY LYMPH NODE BIOPSY  2016   COLONOSCOPY  2017   [facility] [place] [facility]. think there was colon polyps   COLONOSCOPY WITH PROPOFOL N/A 07/11/2020   Procedure: COLONOSCOPY WITH PROPOFOL;  Surgeon: Lavena Bullion, DO;  [facility];  Service: Gastroenterology;  Laterality: N/A;   ESOPHAGOGASTRODUODENOSCOPY (EGD) WITH PROPOFOL N/A 07/11/2020   Procedure: ESOPHAGOGASTRODUODENOSCOPY (EGD) WITH PROPOFOL;  Surgeon: Lavena Bullion, DO;  [facility];  Service: Gastroenterology;  Laterality: N/A;   ESOPHAGOGASTRODUODENOSCOPY (EGD) WITH PROPOFOL N/A 08/11/2020   Procedure: ESOPHAGOGASTRODUODENOSCOPY (EGD) WITH PROPOFOL;  Surgeon: Milus Banister, MD;  [facility];  Service: Endoscopy;  Laterality: N/A;   EUS N/A 08/11/2020   Procedure: UPPER ENDOSCOPIC ULTRASOUND (EUS) RADIAL;  Surgeon: Owens Loffler  P, MD;  [facility];  Service: Endoscopy;  Laterality: N/A;   HAND SURGERY Left    PARTIAL HYSTERECTOMY     SAVORY DILATION N/A 07/11/2020   Procedure: SAVORY DILATION;  Surgeon: Lavena Bullion, DO;  [facility];  Service: Gastroenterology;  Laterality: N/A;   TOTAL SHOULDER ARTHROPLASTY Left 04/12/2020   Procedure: LEFT REVERSE TOTAL SHOULDER ARTHROPLASTY;  Surgeon: Meredith Pel, MD;  Location: [place];  Service: Orthopedics;  Laterality: Left;   TOTAL SHOULDER ARTHROPLASTY Right 09/13/2020   Procedure: RIGHT TOTAL SHOULDER ARTHROPLASTY;  Surgeon: Meredith Pel, MD;  Location: [place];  Service: Orthopedics;   Laterality: Right;    Social History   Socioeconomic History   Marital status: Married    Spouse name: Not on file   Number of children: Not on file   Years of education: Not on file   Highest education level: Not on file  Occupational History   Not on file  Tobacco Use   Smoking status: Former    Packs/day: 2.50    Years: 44.00    Pack years: 110.00    Types: Cigarettes    Quit date: 11/29/2016    Years since quitting: 4.0   Smokeless tobacco: Never  Vaping Use   Vaping Use: Never used  Substance and Sexual Activity   Alcohol use: Not Currently   Drug use: Never   Sexual activity: Yes    Partners: Male    Birth control/protection: None  Other Topics Concern   Not on file  Social History Narrative   Not on file   Social Determinants of Health   Financial Resource Strain: Not on file  Food Insecurity: Not on file  Transportation Needs: Not on file  Physical Activity: Not on file  Stress: Not on file  Social Connections: Not on file    Family History  Problem Relation Age of Onset   Breast cancer Mother    Diabetes Brother    Colon cancer Neg Hx    Esophageal cancer Neg Hx     Health Maintenance  Topic Date Due   OPHTHALMOLOGY EXAM  Never done   Hepatitis C Screening  Never done   COVID-19 Vaccine (4 - Booster for Pfizer series) 01/30/2020   Pneumococcal Vaccine 34-69 Years old (2 - PCV) 10/04/2021 (Originally 05/30/2018)   HEMOGLOBIN A1C  07/04/2021   FOOT EXAM  01/03/2022   MAMMOGRAM  04/07/2022   TETANUS/TDAP  05/30/2027   COLONOSCOPY (Pts 45-22yrs Insurance coverage will need to be confirmed)  07/12/2030   INFLUENZA VACCINE  Completed   HIV Screening  Completed   Zoster Vaccines- Shingrix  Completed   HPV VACCINES  Aged Out   PAP SMEAR-Modifier  Discontinued      ----------------------------------------------------------------------------------------------------------------------------------------------------------------------------------------------------------------- Physical Exam BP 125/75 (BP Location: Right Arm, Patient Position: Sitting, Cuff Size: Small)   Pulse (!) 107   Ht 4\' 11"  (1.499 m)   Wt 120 lb (54.4 kg)   SpO2 94%   BMI 24.24 kg/m   Physical Exam Constitutional:      Comments: Portable O2 in place at 2 L via nasal cannula.  HENT:     Head: Normocephalic and atraumatic.  Eyes:     General: No scleral icterus. Cardiovascular:     Rate and Rhythm: Normal rate and regular rhythm.  Pulmonary:     Effort: Pulmonary effort is normal.     Breath sounds: Wheezing (Scattered) and rhonchi present.  Musculoskeletal:     Cervical back:  Neck supple.  Neurological:     General: No focal deficit present.     Mental Status: She is alert.  Psychiatric:        Mood and Affect: Mood normal.        Behavior: Behavior normal.    ------------------------------------------------------------------------------------------------------------------------------------------------------------------------------------------------------------------- Assessment and Plan  Chronic hypoxemic respiratory failure ([place]) Typically on home O2 at night however has needed to use her home O2 more frequently due to COPD exacerbation.  Recommend continuation of this  COPD, severe ([place]) History of COPD with acute exacerbation.  She is currently on steroids.  I am adding a course of doxycycline as she has having increased sputum production.  Recommend continuation of albuterol as needed.  I am also adding Tussionex to use for cough.  Insomnia She continues to have insomnia despite trazodone at 200 mg nightly.  We discussed changing to doxepin.  Starting doxepin 3 mg nightly.  We can titrate this as needed based on efficacy and side effects.  Mixed  hyperlipidemia She is doing well with atorvastatin at this time.  Recommend continuation.  Hyperlipidemia associated with type 2 diabetes mellitus ([place]) Lab Results  Component Value Date   HGBA1C 6.7 01/03/2021  Blood sugars remain well controlled with metformin.  Recommend continuation   Meds ordered this encounter  Medications   doxycycline (VIBRA-TABS) 100 MG tablet    Sig: Take 1 tablet (100 mg total) by mouth 2 (two) times daily for 7 days.    Dispense:  14 tablet    Refill:  0   chlorpheniramine-HYDROcodone (TUSSIONEX PENNKINETIC ER) 10-8 MG/5ML SUER    Sig: Take 5 mLs by mouth every 12 (twelve) hours as needed for cough.    Dispense:  115 mL    Refill:  0   Doxepin HCl 3 MG TABS    Sig: 1 tab po qhs PRN for sleep.    Dispense:  30 tablet    Refill:  3   albuterol (PROVENTIL) (2.5 MG/3ML) 0.083% nebulizer solution    Sig: Take 3 mLs (2.5 mg total) by nebulization every 6 (six) hours as needed for wheezing or shortness of breath.    Dispense:  50 mL    Refill:  99   amLODipine (NORVASC) 10 MG tablet    Sig: Take 1 tablet (10 mg total) by mouth daily.    Dispense:  90 tablet    Refill:  3   atorvastatin (LIPITOR) 40 MG tablet    Sig: Take 1 tablet (40 mg total) by mouth every evening.    Dispense:  90 tablet    Refill:  3   losartan (COZAAR) 50 MG tablet    Sig: Take 1 tablet (50 mg total) by mouth daily.    Dispense:  90 tablet    Refill:  3   montelukast (SINGULAIR) 10 MG tablet    Sig: Take 1 tablet (10 mg total) by mouth at bedtime.    Dispense:  90 tablet    Refill:  3    Return in about 4 weeks (around 01/31/2021) for Insomnia.    This visit occurred during the SARS-CoV-2 public health emergency.  Safety protocols were in place, including screening questions prior to the visit, additional usage of staff PPE, and extensive cleaning of exam room while observing appropriate contact time as indicated for disinfecting solutions.

## 2021-01-03 NOTE — Assessment & Plan Note (Signed)
Lab Results  Component Value Date   HGBA1C 6.7 01/03/2021  Blood sugars remain well controlled with metformin.  Recommend continuation

## 2021-01-03 NOTE — Assessment & Plan Note (Signed)
Typically on home O2 at night however has needed to use her home O2 more frequently due to COPD exacerbation.  Recommend continuation of this

## 2021-01-03 NOTE — Assessment & Plan Note (Signed)
She continues to have insomnia despite trazodone at 200 mg nightly.  We discussed changing to doxepin.  Starting doxepin 3 mg nightly.  We can titrate this as needed based on efficacy and side effects.

## 2021-01-12 ENCOUNTER — Encounter: Payer: Self-pay | Admitting: Family Medicine

## 2021-01-18 ENCOUNTER — Other Ambulatory Visit: Payer: Self-pay | Admitting: Family Medicine

## 2021-01-18 ENCOUNTER — Other Ambulatory Visit: Payer: Self-pay | Admitting: Pulmonary Disease

## 2021-02-01 ENCOUNTER — Encounter: Payer: Self-pay | Admitting: Family Medicine

## 2021-02-01 ENCOUNTER — Telehealth (INDEPENDENT_AMBULATORY_CARE_PROVIDER_SITE_OTHER): Payer: BC Managed Care – PPO | Admitting: Family Medicine

## 2021-02-01 DIAGNOSIS — F5101 Primary insomnia: Secondary | ICD-10-CM | POA: Diagnosis not present

## 2021-02-01 NOTE — Assessment & Plan Note (Signed)
Increasing doxepin to 6 mg nightly.  If this is ineffective we can consider trying Orexin receptor antagonist such as Belsomra.

## 2021-02-01 NOTE — Progress Notes (Signed)
Medication takes hours to start working. Sleeping 2 hours after medication.

## 2021-02-01 NOTE — Progress Notes (Signed)
Sherri Gibson - 61 y.o. female MRN [id number]  Date of birth: 1960-11-21   This visit type was conducted due to national recommendations for restrictions regarding the COVID-19 Pandemic (e.g. social distancing).  This format is felt to be most appropriate for this patient at this time.  All issues noted in this document were discussed and addressed.  No physical exam was performed (except for noted visual exam findings with Video Visits).  I discussed the limitations of evaluation and management by telemedicine and the availability of in person appointments. The patient expressed understanding and agreed to proceed.  I connected withNAME@ on 02/01/21 at 10:30 AM EST by a video enabled telemedicine application and verified that I am speaking with the correct person using two identifiers.  Present at visit: Luetta Nutting, DO [name]   Patient Location: [place] [address]   Provider location:   [place]  Chief Complaint  Patient presents with   Insomnia    HPI  Sherri Gibson is a 61 y.o. female who presents via audio/video conferencing for a telehealth visit today.  She is following up today for insomnia.  She reports that current dose of doxepin has not really been effective for her.  She has not tried increasing this.  She does have severe COPD and we are trying to avoid benzodiazepine type medications due to potential for respiratory depression.      ROS:  A comprehensive ROS was completed and negative except as noted per HPI  Past Medical History:  Diagnosis Date   Anemia    as a child   Arthritis    Both Shoulders   Breast cancer ([place]) 7654   Left    Complication of anesthesia    states she had a hard time staying awake after hand surgery   COPD (chronic obstructive pulmonary disease) ([place])    uses O2 at 2l at night   High blood pressure    High cholesterol    History of kidney stones    Personal history of radiation  therapy 2016   Left Breast Cancer   Pneumonia    as a child   Pre-diabetes    Prediabetes    Wears dentures    upper dentures    Past Surgical History:  Procedure Laterality Date   BIOPSY  07/11/2020   Procedure: BIOPSY;  Surgeon: Lavena Bullion, DO;  [facility];  Service: Gastroenterology;;   BIOPSY  08/11/2020   Procedure: BIOPSY;  Surgeon: Milus Banister, MD;  [facility];  Service: Endoscopy;;   BREAST BIOPSY     BREAST LUMPECTOMY Left 2021   BREAST LUMPECTOMY WITH AXILLARY LYMPH NODE BIOPSY  2016   COLONOSCOPY  2017   [facility]. think there was colon polyps   COLONOSCOPY WITH PROPOFOL N/A 07/11/2020   Procedure: COLONOSCOPY WITH PROPOFOL;  Surgeon: Lavena Bullion, DO;  [facility];  Service: Gastroenterology;  Laterality: N/A;   ESOPHAGOGASTRODUODENOSCOPY (EGD) WITH PROPOFOL N/A 07/11/2020   Procedure: ESOPHAGOGASTRODUODENOSCOPY (EGD) WITH PROPOFOL;  Surgeon: Lavena Bullion, DO;  [facility];  Service: Gastroenterology;  Laterality: N/A;   ESOPHAGOGASTRODUODENOSCOPY (EGD) WITH PROPOFOL N/A 08/11/2020   Procedure: ESOPHAGOGASTRODUODENOSCOPY (EGD) WITH PROPOFOL;  Surgeon: Milus Banister, MD;  [facility];  Service: Endoscopy;  Laterality: N/A;   EUS N/A 08/11/2020   Procedure: UPPER ENDOSCOPIC ULTRASOUND (EUS) RADIAL;  Surgeon: Milus Banister, MD;  [facility];  Service: Endoscopy;  Laterality: N/A;   HAND SURGERY Left    PARTIAL HYSTERECTOMY     SAVORY DILATION N/A 07/11/2020   Procedure: SAVORY DILATION;  Surgeon: Lavena Bullion, DO;  [facility];  Service: Gastroenterology;  Laterality: N/A;   TOTAL SHOULDER ARTHROPLASTY Left 04/12/2020   Procedure: LEFT REVERSE TOTAL SHOULDER ARTHROPLASTY;  Surgeon: Meredith Pel, MD;  Location: [place];  Service: Orthopedics;  Laterality: Left;   TOTAL SHOULDER ARTHROPLASTY Right 09/13/2020   Procedure:  RIGHT TOTAL SHOULDER ARTHROPLASTY;  Surgeon: Meredith Pel, MD;  Location: [place];  Service: Orthopedics;  Laterality: Right;    Family History  Problem Relation Age of Onset   Breast cancer Mother    Diabetes Brother    Colon cancer Neg Hx    Esophageal cancer Neg Hx     Social History   Socioeconomic History   Marital status: Married    Spouse name: Not on file   Number of children: Not on file   Years of education: Not on file   Highest education level: Not on file  Occupational History   Not on file  Tobacco Use   Smoking status: Former    Packs/day: 2.50    Years: 44.00    Pack years: 110.00    Types: Cigarettes    Quit date: 11/29/2016    Years since quitting: 4.1   Smokeless tobacco: Never  Vaping Use   Vaping Use: Never used  Substance and Sexual Activity   Alcohol use: Not Currently   Drug use: Never   Sexual activity: Yes    Partners: Male    Birth control/protection: None  Other Topics Concern   Not on file  Social History Narrative   Not on file   Social Determinants of Health   Financial Resource Strain: Not on file  Food Insecurity: Not on file  Transportation Needs: Not on file  Physical Activity: Not on file  Stress: Not on file  Social Connections: Not on file  Intimate Partner Violence: Not on file     Current Outpatient Medications:    albuterol (PROVENTIL) (2.5 MG/3ML) 0.083% nebulizer solution, Take 3 mLs (2.5 mg total) by nebulization every 6 (six) hours as needed for wheezing or shortness of breath., Disp: 50 mL, Rfl: 99   albuterol (VENTOLIN HFA) 108 (90 Base) MCG/ACT inhaler, Inhale 2 puffs into the lungs every 4 (four) hours as needed for wheezing or shortness of breath., Disp: 18 g, Rfl: 4   amLODipine (NORVASC) 10 MG tablet, Take 1 tablet (10 mg total) by mouth daily., Disp: 90 tablet, Rfl: 3   aspirin EC 81 MG tablet, Take 81 mg by mouth every evening. Swallow whole., Disp: , Rfl:    atorvastatin (LIPITOR) 40 MG tablet, Take  1 tablet (40 mg total) by mouth every evening., Disp: 90 tablet, Rfl: 3   budesonide-formoterol (SYMBICORT) 160-4.5 MCG/ACT inhaler, Inhale 2 puffs into the lungs in the morning and at bedtime., Disp: 30.6 g, Rfl: 3   Calcium Carb-Cholecalciferol (CALCIUM 600-D PO), Take 1 tablet by mouth in the morning and at bedtime., Disp: , Rfl:    cyclobenzaprine (FLEXERIL) 10 MG tablet, Take 10 mg by mouth 3 (three) times daily., Disp: , Rfl:    Doxepin HCl 3 MG TABS, 1 tab po qhs PRN for sleep., Disp: 30 tablet, Rfl: 3   loratadine (CLARITIN) 10 MG tablet, Take 10 mg by mouth every evening., Disp: , Rfl:    losartan (COZAAR) 50 MG  tablet, Take 1 tablet (50 mg total) by mouth daily., Disp: 90 tablet, Rfl: 3   metFORMIN (GLUCOPHAGE) 500 MG tablet, Take 2 tablets (1,000 mg total) by mouth daily., Disp: 180 tablet, Rfl: 3   montelukast (SINGULAIR) 10 MG tablet, Take 1 tablet (10 mg total) by mouth at bedtime., Disp: 90 tablet, Rfl: 3   Multiple Vitamins-Minerals (MULTIVITAMIN WITH MINERALS) tablet, Take 1 tablet by mouth daily., Disp: , Rfl:    OXYGEN, Inhale 2 L into the lungs at bedtime., Disp: , Rfl:    pantoprazole (PROTONIX) 40 MG tablet, Take 1 tablet (40 mg total) by mouth 2 (two) times daily., Disp: 180 tablet, Rfl: 3   Respiratory Therapy Supplies (NEBULIZER AIR TUBE/PLUGS) MISC, As directed w/ nebulizer for Dx COPD, Disp: 1 each, Rfl: 99   Respiratory Therapy Supplies (NEBULIZER) DEVI, As directed for Dx COPD, Disp: 1 each, Rfl: 99   sodium chloride (OCEAN) 0.65 % SOLN nasal spray, Place 1 spray into both nostrils daily as needed (Dry nose)., Disp: , Rfl:    Tiotropium Bromide Monohydrate (SPIRIVA RESPIMAT) 2.5 MCG/ACT AERS, USE 2 INHALATIONS DAILY, Disp: 12 g, Rfl: 1   chlorpheniramine-HYDROcodone (TUSSIONEX PENNKINETIC ER) 10-8 MG/5ML SUER, Take 5 mLs by mouth every 12 (twelve) hours as needed for cough. (Patient not taking: Reported on 02/01/2021), Disp: 115 mL, Rfl: 0  EXAM:  VITALS per patient if  applicable: Ht 4\' 11"  (1.499 m)    Wt 120 lb (54.4 kg)    SpO2 95%    BMI 24.24 kg/m   GENERAL: alert, oriented, appears well and in no acute distress  HEENT: atraumatic, conjunttiva clear, no obvious abnormalities on inspection of external nose and ears  NECK: normal movements of the head and neck  LUNGS: on inspection no signs of respiratory distress, breathing rate appears normal, no obvious gross SOB, gasping or wheezing  CV: no obvious cyanosis  MS: moves all visible extremities without noticeable abnormality  PSYCH/NEURO: pleasant and cooperative, no obvious depression or anxiety, speech and thought processing grossly intact  ASSESSMENT AND PLAN:  Discussed the following assessment and plan:  Insomnia Increasing doxepin to 6 mg nightly.  If this is ineffective we can consider trying Orexin receptor antagonist such as Belsomra.     I discussed the assessment and treatment plan with the patient. The patient was provided an opportunity to ask questions and all were answered. The patient agreed with the plan and demonstrated an understanding of the instructions.   The patient was advised to call back or seek an in-person evaluation if the symptoms worsen or if the condition fails to improve as anticipated.    Luetta Nutting, DO

## 2021-02-03 ENCOUNTER — Telehealth: Payer: Self-pay

## 2021-02-03 NOTE — Telephone Encounter (Signed)
Pt lvm requesting a new sleeping medication. States doxepin is not working.  Please advise.

## 2021-02-06 MED ORDER — BELSOMRA 10 MG PO TABS: 10.0000 mg | ORAL_TABLET | Freq: Every evening | ORAL | 1 refills | Status: DC | PRN

## 2021-02-06 NOTE — Addendum Note (Signed)
Addended by: Perlie Mayo on: 02/06/2021 12:22 PM   Modules accepted: Orders

## 2021-02-06 NOTE — Telephone Encounter (Signed)
Pt has been advised of new Rx sent to pharmacy.

## 2021-02-14 ENCOUNTER — Other Ambulatory Visit: Payer: Self-pay

## 2021-02-14 MED ORDER — BELSOMRA 20 MG PO TABS: 20.0000 mg | ORAL_TABLET | Freq: Every evening | ORAL | 2 refills | Status: DC | PRN

## 2021-02-15 ENCOUNTER — Telehealth: Payer: Self-pay

## 2021-02-15 ENCOUNTER — Other Ambulatory Visit: Payer: Self-pay | Admitting: Family Medicine

## 2021-02-15 ENCOUNTER — Encounter: Payer: Self-pay | Admitting: Pulmonary Disease

## 2021-02-15 ENCOUNTER — Other Ambulatory Visit: Payer: Self-pay

## 2021-02-15 ENCOUNTER — Encounter: Payer: Self-pay | Admitting: Family Medicine

## 2021-02-15 ENCOUNTER — Ambulatory Visit: Payer: BC Managed Care – PPO | Admitting: Pulmonary Disease

## 2021-02-15 DIAGNOSIS — R918 Other nonspecific abnormal finding of lung field: Secondary | ICD-10-CM | POA: Diagnosis not present

## 2021-02-15 DIAGNOSIS — J9611 Chronic respiratory failure with hypoxia: Secondary | ICD-10-CM

## 2021-02-15 DIAGNOSIS — J449 Chronic obstructive pulmonary disease, unspecified: Secondary | ICD-10-CM | POA: Diagnosis not present

## 2021-02-15 MED ORDER — SPIRIVA RESPIMAT 2.5 MCG/ACT IN AERS: 2.0000 | INHALATION_SPRAY | Freq: Every day | RESPIRATORY_TRACT | 3 refills | Status: DC

## 2021-02-15 MED ORDER — BELSOMRA 20 MG PO TABS
20.0000 mg | ORAL_TABLET | Freq: Every evening | ORAL | 2 refills | Status: DC | PRN
Start: 2021-02-15 — End: 2021-05-15

## 2021-02-15 NOTE — Telephone Encounter (Signed)
Medication: Suvorexant (BELSOMRA) 20 MG TABS Prior authorization submitted via CoverMyMeds on 02/15/2021 PA submission pending

## 2021-02-15 NOTE — Patient Instructions (Addendum)
Severe COPD - last exacerbation 12/2020, currently symptoms controlled CONTINUE Symbicort 160-4.5 mcg 2 puffs twice daily.  CONTINUE Spiriva  2.5 mcg 2 puffs daily. REFILLED Encourage regular aerobic activity   Chronic hypoxemic respiratory failure - improved Wear 2L oxygen via nasal cannula for goal SpO2 >88%   Multiple lung nodules - stable Annual lung screen  Follow-up with me in 3 months

## 2021-02-15 NOTE — Telephone Encounter (Signed)
Medication: Suvorexant (BELSOMRA) 20 MG TABS Prior authorization determination received Rx is covered under current benefit plan. No PA required.  Pharmacy aware: Yes Provider aware via this encounter

## 2021-02-15 NOTE — Progress Notes (Signed)
Subjective:   PATIENT ID: Sherri Gibson GENDER: female DOB: 16-Sep-1960, MRN: [id number]   HPI  Chief Complaint  Patient presents with   Follow-up    Respiratory failure   Reason for Visit: Follow-up  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 Lake Bells patient, last seen September 2019.  She was initially diagnosed with COPD in 04/05/2008 and has been on oxygen since 04/06/15. She has been compliant with her Symbicort 160-4.5 mcg 2 puffs twice daily and Spiriva 2.5 mcg 2 puffs daily. She lives in a 3 story house and will need to wear her oxygen with it. She walks regularly on her treadmill. She is able to walk upstairs and around the house. Able to do yardwork. No limitations in activity except when needing to carry things due to her shoulder issues. She wears her oxygen overnight but unsure if she actually drops.   11/25/20 Her husband is in hospice so she is unable to leave her home for medical appointments right now. Her family had RSV which led to her having productive cough, nasal congestion, wheezing and shortness of breath. Has improved with hot tea and inhalers. No longer having symptoms. She has purchased a Marine scientist and has been measuring her home O2 around 93%. Wears her oxygen nightly and with activity if it drops below 88%.   2021/02/23 Her husband passed away 10-Dec-2022. After Thanksgiving she was went to the ED for hypoxemia secondary to COPD exacerbation. Treated with steroids and nebs. Followed up by her PCP with given doxycycline and cough syrup. Denies wheezing. Occasional dry cough. Shortness of breath with exertion including walking upstairs so she wears oxygen more frequently lately. Has not been active recently, preferring to stay at home. Has had difficulty  sleeping. Her niece will check on her periodically.  Social History: She smoked for many years, typically 2 packs per day from age [age].  She quit with Chantix in 04/05/16. 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  Past Medical History:  Diagnosis Date   Anemia    as a child   Arthritis    Both Shoulders   Breast cancer ([place]) 7622   Left    Complication of anesthesia    states she had a hard time staying awake after hand surgery   COPD (chronic obstructive pulmonary disease) ([place])    uses O2 at 2l at night   High blood pressure    High cholesterol    History of kidney stones    Personal history of radiation therapy 04/05/14   Left Breast Cancer   Pneumonia    as a child   Pre-diabetes    Prediabetes    Wears dentures    upper dentures     Outpatient Medications Prior to Visit  Medication Sig Dispense Refill   albuterol (PROVENTIL) (2.5 MG/3ML) 0.083% nebulizer solution Take 3 mLs (2.5 mg total) by nebulization every 6 (six) hours as needed for wheezing or shortness of breath. 50 mL 99   albuterol (VENTOLIN HFA) 108 (90 Base) MCG/ACT inhaler Inhale 2 puffs into the lungs every 4 (four) hours as needed for wheezing or shortness of breath. 18 g 4   amLODipine (NORVASC) 10 MG tablet Take  1 tablet (10 mg total) by mouth daily. 90 tablet 3   aspirin EC 81 MG tablet Take 81 mg by mouth every evening. Swallow whole.     atorvastatin (LIPITOR) 40 MG tablet Take 1 tablet (40 mg total) by mouth every evening. 90 tablet 3   budesonide-formoterol (SYMBICORT) 160-4.5 MCG/ACT inhaler Inhale 2 puffs into the lungs in the morning and at bedtime. 30.6 g 3   Calcium Carb-Cholecalciferol (CALCIUM 600-D PO) Take 1 tablet by mouth in the morning and at bedtime.     cyclobenzaprine (FLEXERIL) 10 MG tablet Take 10 mg by mouth 3 (three) times daily.     loratadine (CLARITIN) 10 MG tablet Take 10 mg by mouth every  evening.     losartan (COZAAR) 50 MG tablet Take 1 tablet (50 mg total) by mouth daily. 90 tablet 3   metFORMIN (GLUCOPHAGE) 500 MG tablet Take 2 tablets (1,000 mg total) by mouth daily. 180 tablet 3   Multiple Vitamins-Minerals (MULTIVITAMIN WITH MINERALS) tablet Take 1 tablet by mouth daily.     OXYGEN Inhale 2 L into the lungs at bedtime.     pantoprazole (PROTONIX) 40 MG tablet Take 1 tablet (40 mg total) by mouth 2 (two) times daily. 180 tablet 3   Respiratory Therapy Supplies (NEBULIZER AIR TUBE/PLUGS) MISC As directed w/ nebulizer for Dx COPD 1 each 99   Respiratory Therapy Supplies (NEBULIZER) DEVI As directed for Dx COPD 1 each 99   sodium chloride (OCEAN) 0.65 % SOLN nasal spray Place 1 spray into both nostrils daily as needed (Dry nose).     Tiotropium Bromide Monohydrate (SPIRIVA RESPIMAT) 2.5 MCG/ACT AERS USE 2 INHALATIONS DAILY 12 g 1   chlorpheniramine-HYDROcodone (TUSSIONEX PENNKINETIC ER) 10-8 MG/5ML SUER Take 5 mLs by mouth every 12 (twelve) hours as needed for cough. (Patient not taking: Reported on 02/01/2021) 115 mL 0   Doxepin HCl 3 MG TABS 1 tab po qhs PRN for sleep. (Patient not taking: Reported on 02/15/2021) 30 tablet 3   montelukast (SINGULAIR) 10 MG tablet Take 1 tablet (10 mg total) by mouth at bedtime. (Patient not taking: Reported on 02/15/2021) 90 tablet 3   Suvorexant (BELSOMRA) 20 MG TABS Take 20 mg by mouth at bedtime as needed. (Patient not taking: Reported on 02/15/2021) 30 tablet 2   No facility-administered medications prior to visit.    Review of Systems  Constitutional:  Negative for chills, diaphoresis, fever, malaise/fatigue and weight loss.  HENT:  Negative for congestion.   Respiratory:  Positive for shortness of breath. Negative for cough, hemoptysis, sputum production and wheezing.   Cardiovascular:  Negative for chest pain, palpitations and leg swelling.   Objective:   Vitals:   02/15/21 0857  BP: 112/80  Pulse: 96  Temp: 98.1 F (36.7 C)   TempSrc: Oral  SpO2: 90%  Weight: 118 lb (53.5 kg)  Height: '4\' 11"'  (1.499 m)   SpO2: 90 % (2L) O2 Device: Nasal cannula O2 Flow Rate (L/min): 2 L/min O2 Type: Pulse O2  Physical Exam: General: Well-appearing, no acute distress HENT: [place], AT Eyes: EOMI, no scleral icterus Respiratory: Diminished breath sounds bilaterally.  No crackles, wheezing or rales Cardiovascular: RRR, -M/R/G, no JVD Extremities:-Edema,-tenderness Neuro: AAO x4, CNII-XII grossly intact Psych: Normal mood, normal affect  Data Reviewed:  Imaging: 10/2015 CT chest [facility] multiple calcified granulomatous largest is 8 mm, 2 noncalcified 3 mm left lower lobe nodules 11/2016 CT chest 6-58m nodules Right upper an lower nodules (per patient this had  been seen in [place] knew about this as well).   04/13/19 CT Lung Screen - Bilateral calcified granulomas bilaterally. Several noncalcified tiny nodules with largest 31m, unchanged. Centrilobular emphysema 05/16/20 CT Lung Screen -Unchanged bilateral small calcified and uncalcified nodules <353m Centrilobular emphysema  [facility] FEV1 1.03 L 45% predicted, no air trapping, diffusion capacity 35% predicted  Ambulatory O2: 02/15/21 - No desaturations. SpO2 Nadir 90%    Assessment & Plan:   Discussion: [age]ear old female with severe COPD and emphysema, subcentimeter lung nodules and history stage I IDC of the left breast with recurrent status postlumpectomy and radiation who presents for follow-up.  Last COPD exacerbation in October and December 2022.  Her symptoms have improved since then however continues to have dyspnea and wearing her oxygen more frequently during the daytime. Reviewed ambulatory O2 with no desaturations. Counseled on indications for oxygen use.  Severe COPD - last exacerbation 12/2020, symptomatic CONTINUE Symbicort 160-4.5 mcg 2 puffs twice daily.  CONTINUE Spiriva  2.5 mcg 2 puffs daily.  REFILLED Encourage regular aerobic activity   Chronic hypoxemic respiratory failure - improved Wear 2L oxygen via nasal cannula for goal SpO2 >88%   Multiple lung nodules - stable Annual lung screen  Grief Counseled. Provided empathy  Health Maintenance Immunization History  Administered Date(s) Administered   Influenza, Seasonal, Injecte, Preservative Fre 10/15/2016   Influenza,inj,Quad PF,6+ Mos 09/23/2017, 09/16/2018, 10/14/2019, 10/04/2020   PFIZER(Purple Top)SARS-COV-2 Vaccination 04/14/2019, 05/05/2019, 12/05/2019   PNEUMOCOCCAL CONJUGATE-20 12/19/2020   Pneumococcal Polysaccharide-23 05/29/2017   Tdap 05/29/2017   Zoster Recombinat (Shingrix) 04/08/2020, 08/13/2020   CT Lung Screen - Due 04/2021  No orders of the defined types were placed in this encounter.  Meds ordered this encounter  Medications   Tiotropium Bromide Monohydrate (SPIRIVA RESPIMAT) 2.5 MCG/ACT AERS    Sig: Inhale 2 puffs into the lungs daily.    Dispense:  12 g    Refill:  3   Return in about 3 months (around 05/16/2021).  I have spent a total time of 35-minutes on the day of the appointment reviewing prior documentation, coordinating care and discussing medical diagnosis and plan with the patient/family. Past medical history, allergies, medications were reviewed. Pertinent imaging, labs and tests included in this note have been reviewed and interpreted independently by me.  [facility] 02/15/2021 10:18 AM  Office Number [phone]

## 2021-02-15 NOTE — Telephone Encounter (Signed)
Sent updated dose previously.  Sending again.

## 2021-03-22 ENCOUNTER — Other Ambulatory Visit: Payer: Self-pay | Admitting: Family

## 2021-03-22 DIAGNOSIS — Z1231 Encounter for screening mammogram for malignant neoplasm of breast: Secondary | ICD-10-CM

## 2021-04-13 ENCOUNTER — Other Ambulatory Visit: Payer: Self-pay

## 2021-04-13 ENCOUNTER — Ambulatory Visit (INDEPENDENT_AMBULATORY_CARE_PROVIDER_SITE_OTHER): Payer: BC Managed Care – PPO

## 2021-04-13 ENCOUNTER — Encounter: Payer: Self-pay | Admitting: *Deleted

## 2021-04-13 DIAGNOSIS — Z1231 Encounter for screening mammogram for malignant neoplasm of breast: Secondary | ICD-10-CM | POA: Diagnosis not present

## 2021-05-01 ENCOUNTER — Telehealth: Payer: Self-pay

## 2021-05-01 NOTE — Telephone Encounter (Signed)
Pt has been scheduled virtually with Jade in the AM. ?

## 2021-05-01 NOTE — Telephone Encounter (Signed)
Pt lvm stating she was COVID +. She is currently in [place]. Went to a doctors appt. They felt she was having a COPD flare up and started her on prednisone. She would like to know if she should stop prednisone and start paxlovid.  ? ?Front Desk ?Please contact the pt for a virtual appt with the 1st available or [facility] virtual appts. Thanks ?

## 2021-05-02 ENCOUNTER — Telehealth: Payer: BC Managed Care – PPO | Admitting: Physician Assistant

## 2021-05-02 ENCOUNTER — Encounter: Payer: Self-pay | Admitting: Physician Assistant

## 2021-05-02 VITALS — BP 118/86 | Temp 98.1°F | Ht 59.0 in | Wt 118.0 lb

## 2021-05-02 DIAGNOSIS — J441 Chronic obstructive pulmonary disease with (acute) exacerbation: Secondary | ICD-10-CM | POA: Diagnosis not present

## 2021-05-02 DIAGNOSIS — U071 COVID-19: Secondary | ICD-10-CM

## 2021-05-02 DIAGNOSIS — J449 Chronic obstructive pulmonary disease, unspecified: Secondary | ICD-10-CM

## 2021-05-02 NOTE — Progress Notes (Signed)
Started with cough last Wednesday ?Saw urgent care Thursday and given Prednisone 20 mg - 2 tablets daily for 7 days, they thought it was COPD flare ?Patient tested positive for Covid with home test yesterday ?Out of town in [place] ? ?Cough is better ?No SOB ?No Tightness in chest ?O2 running 94-98 ? ?

## 2021-05-02 NOTE — Progress Notes (Signed)
..Virtual Visit via Video Note ? ?I connected with Sherri Gibson on 05/02/21 at 11:10 AM EDT by a video enabled telemedicine application and verified that I am speaking with the correct person using two identifiers. ? ?Location: ?Patient: home ?Provider: clinic ? ?Marland Kitchen.Participating in visit:  ?Patient: Sherri Gibson ?Provider: Iran Planas PA-C ? ?I discussed the limitations of evaluation and management by telemedicine and the availability of in person appointments. The patient expressed understanding and agreed to proceed. ? ?History of Present Illness: ?Pt is a 61 yo female with severe COPD who started having URI symptoms last Wednesday 7 days ago. She is out of town.  She went to [facility] and was treated for COPD exacerbation with prednisone. She was not tested for covid. Her mother became ill and tested positive for covid the next day and she tested on Sunday and was positive. She wonders what to do next. She has been covid vaccinated x3. She does have cough but no significant problems breathing. She does not have her nebulizer but using her albuterol inhaler.  ? ? .. ?Active Ambulatory Problems  ?  Diagnosis Date Noted  ? Breast cancer ([place]) 09/20/2016  ? Colonoscopy refused 05/29/2017  ? Erythrocytosis 11/13/2016  ? Essential hypertension 09/20/2016  ? Insomnia 09/20/2016  ? Kidney lesion 12/14/2016  ? Mixed hyperlipidemia 09/20/2016  ? COPD, severe ([place]) 05/29/2017  ? Multiple pulmonary nodules 05/29/2017  ? Trigger thumb, right thumb 05/29/2017  ? Primary osteoarthritis of first carpometacarpal joint of left hand 05/29/2017  ? Hyperlipidemia associated with type 2 diabetes mellitus ([place]) 09/23/2017  ? Osteopenia 12/20/2017  ? Chronic hypoxemic respiratory failure ([place]) 12/22/2018  ? Primary osteoarthritis of both shoulders 03/06/2019  ? Encounter for screening for lung cancer 04/13/2019  ? 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 Arthritis of left shoulder region   ? Dysphagia   ? Gastroesophageal  reflux disease with esophagitis without hemorrhage   ? Gastritis and gastroduodenitis   ? Gastric nodule   ? Colon cancer screening   ? Left wrist tendonitis 08/12/2020  ? Arthritis of right shoulder region   ? 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 COPD exacerbation ([place]) 05/02/2021  ? ?Resolved Ambulatory Problems  ?  Diagnosis Date Noted  ? At high risk for breast cancer 05/24/2017  ? Pulmonary emphysema ([place]) 09/20/2016  ? Diabetes mellitus without complication ([place]) 94/70/9628  ? Acute non-recurrent maxillary sinusitis 11/29/2017  ? ?Past Medical History:  ?Diagnosis Date  ? Anemia   ? Arthritis   ? Complication of anesthesia   ? COPD (chronic obstructive pulmonary disease) ([place])   ? High blood pressure   ? High cholesterol   ? History of kidney stones   ? Personal history of radiation therapy 2016  ? Pneumonia   ? Pre-diabetes   ? Prediabetes   ? Wears dentures   ? ? ?Observations/Objective: ?No acute distress ?Normal breathing, no wheezing or labored breathing ?Productive cough on video ?Normal mood  ? ?.. ?Today's Vitals  ? 05/02/21 1041  ?BP: 118/86  ?Temp: 98.1 ?F (36.7 ?C)  ?TempSrc: Oral  ?SpO2: 98%  ?Weight: 118 lb (53.5 kg)  ?Height: '4\' 11"'$  (1.499 m)  ? ?Body mass index is 23.83 kg/m?. ? ? ? ?Assessment and Plan: ?..[name] was seen today for covid positive. ? ?Diagnoses and all orders for this visit: ? ?COVID-19 virus infection ? ?COPD, severe ([place]) ? ?COPD exacerbation ([place]) ? ? ?Out of window for paxlovid.  ?Continue prednisone ?Add mucinex ?  Albuterol every 3-6 hours as needed for SOB/Cough/wheezing.  ?Rest and hydrate ?Discussed red flag warning signs ?Use O2 at night and if O2 drops below 92 percent ?O2 below 90 go to ED.  ?Continue on preventative inhalers wear a mask for another 3 days.  ? ? ?Follow Up Instructions: ? ?  ?I discussed the assessment and treatment plan with the patient. The patient was provided an opportunity to ask questions and all were answered. The patient  agreed with the plan and demonstrated an understanding of the instructions. ?  ?The patient was advised to call back or seek an in-person evaluation if the symptoms worsen or if the condition fails to improve as anticipated. ? ? ? ?Iran Planas, PA-C ? ?

## 2021-05-14 ENCOUNTER — Other Ambulatory Visit: Payer: Self-pay | Admitting: Family Medicine

## 2021-05-14 ENCOUNTER — Other Ambulatory Visit: Payer: Self-pay | Admitting: Osteopathic Medicine

## 2021-05-16 ENCOUNTER — Telehealth: Payer: Self-pay | Admitting: Family Medicine

## 2021-05-16 NOTE — Telephone Encounter (Signed)
Patient came in to office and stated she needs her prescription of BELSOMRA 20 MG TABS ?Sent to [facility] on [address] in [place]. She stated that the initial prescription was sent to a pharmacy in [place]. Patient stated she wants the pharmacy in [place] removed from her chart!!! lmr ?

## 2021-05-16 NOTE — Telephone Encounter (Signed)
Thank you, I informed patient. ?

## 2021-05-29 ENCOUNTER — Encounter: Payer: Self-pay | Admitting: Pulmonary Disease

## 2021-05-29 ENCOUNTER — Ambulatory Visit: Payer: BC Managed Care – PPO | Admitting: Pulmonary Disease

## 2021-05-29 VITALS — BP 114/80 | HR 80 | Temp 98.7°F | Ht 59.0 in | Wt 124.0 lb

## 2021-05-29 DIAGNOSIS — J9611 Chronic respiratory failure with hypoxia: Secondary | ICD-10-CM | POA: Diagnosis not present

## 2021-05-29 DIAGNOSIS — J449 Chronic obstructive pulmonary disease, unspecified: Secondary | ICD-10-CM

## 2021-05-29 MED ORDER — ALBUTEROL SULFATE (2.5 MG/3ML) 0.083% IN NEBU: 2.5000 mg | INHALATION_SOLUTION | Freq: Four times a day (QID) | RESPIRATORY_TRACT | 1 refills | Status: DC | PRN

## 2021-05-29 NOTE — Patient Instructions (Addendum)
Severe COPD ?CONTINUE Symbicort 160-4.5 mcg 2 puffs twice daily.  ?CONTINUE Spiriva 2.5 mcg 2 puffs daily. REFILLED ?Encourage regular aerobic activity ?  ?Nocturnal oxygen ?Wear 2L oxygen via nasal cannula for goal SpO2 >88% ?Wean with sleep ? ?Follow-up with me in 6 months ?

## 2021-05-29 NOTE — Progress Notes (Signed)
? ? ?Subjective:  ? ?PATIENT ID: Sherri Gibson GENDER: female DOB: 27-Nov-1960, MRN: [id number] ? ? ?HPI ? ?Chief Complaint  ?Patient presents with  ? Follow-up  ?  Patient states that she had covid the end of March. She states that her breathing is worse in the afternoons.   ? ?Reason for Visit: Follow-up ? 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 ?Synopsis: She is a former Dr. [facility] Bells patient, last seen September 2019.  She was initially diagnosed with COPD in 2010 and has been on oxygen since 2017. She has been compliant with her Symbicort 160-4.5 mcg 2 puffs twice daily and Spiriva 2.5 mcg 2 puffs daily. She lives in a 3 story house and will need to wear her oxygen with it. She walks regularly on her treadmill. She is able to walk upstairs and around the house. Able to do yardwork. No limitations in activity except when needing to carry things due to her shoulder issues. She wears her oxygen overnight but unsure if she actually drops.  ? ?11/25/20 ?Her husband is in hospice so she is unable to leave her home for medical appointments right now. Her family had RSV which led to her having productive cough, nasal congestion, wheezing and shortness of breath. Has improved with hot tea and inhalers. No longer having symptoms. She has purchased a Marine scientist and has been measuring her home O2 around 93%. Wears her oxygen nightly and with activity if it drops below 88%.  ? ?03/13/2021 ?Her husband passed away 12/28/2022. After Thanksgiving she was went to the ED for hypoxemia secondary to COPD exacerbation. Treated with steroids and nebs. Followed up by her PCP with given doxycycline and cough syrup. Denies wheezing. Occasional dry cough. Shortness of breath with exertion including walking upstairs so she wears oxygen more  frequently lately. Has not been active recently, preferring to stay at home. Has had difficulty sleeping. Her niece will check on her periodically. ? ?05/29/21 ?Since our last visit COVID in March with cough and nasal congestion. Took steroids. Did not take paxlovid due to being out of the window. Has noticed that she needs her oxygen in the evening ~3 times a week for SpO2 <88%. Has not been using nebulizers recently when she was previously using it 1-2 times daily ? ?Social History: ?She smoked for many years, typically 2 packs per day from age [age].  She quit with Chantix in 2018. 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 ? ?Past Medical History:  ?Diagnosis Date  ? Anemia   ? as a child  ? Arthritis   ? Both Shoulders  ? Breast cancer ([place]) 2016  ? Left   ? Complication of anesthesia   ? states she had a hard time staying awake after hand surgery  ? COPD (chronic obstructive pulmonary disease) ([place])   ? uses O2 at 2l at night  ? High blood pressure   ? High cholesterol   ? History of kidney stones   ? Personal history of radiation therapy 2016  ? Left Breast Cancer  ? Pneumonia   ? as a child  ? Pre-diabetes   ? Prediabetes   ? Wears dentures   ? upper dentures  ?  ? ?Outpatient Medications Prior  to Visit  ?Medication Sig Dispense Refill  ? albuterol (VENTOLIN HFA) 108 (90 Base) MCG/ACT inhaler Inhale 2 puffs into the lungs every 4 (four) hours as needed for wheezing or shortness of breath. 18 g 4  ? amLODipine (NORVASC) 10 MG tablet Take 1 tablet (10 mg total) by mouth daily. 90 tablet 3  ? aspirin EC 81 MG tablet Take 81 mg by mouth every evening. Swallow whole.    ? atorvastatin (LIPITOR) 40 MG tablet Take 1 tablet (40 mg total) by mouth every evening. 90 tablet 3  ? BELSOMRA 20 MG TABS TAKE 20 MG BY MOUTH AT BEDTIME AS NEEDED. 30 tablet 2  ? budesonide-formoterol (SYMBICORT) 160-4.5 MCG/ACT inhaler Inhale 2 puffs into  the lungs in the morning and at bedtime. 30.6 g 3  ? Calcium Carb-Cholecalciferol (CALCIUM 600-D PO) Take 1 tablet by mouth in the morning and at bedtime.    ? cyclobenzaprine (FLEXERIL) 10 MG tablet Take 10 mg by mouth 3 (three) times daily.    ? loratadine (CLARITIN) 10 MG tablet TAKE 1 TABLET BY MOUTH EVERY DAY 90 tablet 1  ? losartan (COZAAR) 50 MG tablet Take 1 tablet (50 mg total) by mouth daily. 90 tablet 3  ? metFORMIN (GLUCOPHAGE) 500 MG tablet Take 2 tablets (1,000 mg total) by mouth daily. 180 tablet 3  ? Multiple Vitamins-Minerals (MULTIVITAMIN WITH MINERALS) tablet Take 1 tablet by mouth daily.    ? OXYGEN Inhale 2 L into the lungs at bedtime.    ? pantoprazole (PROTONIX) 40 MG tablet Take 1 tablet (40 mg total) by mouth 2 (two) times daily. 180 tablet 3  ? Respiratory Therapy Supplies (NEBULIZER AIR TUBE/PLUGS) MISC As directed w/ nebulizer for Dx COPD 1 each 99  ? Respiratory Therapy Supplies (NEBULIZER) DEVI As directed for Dx COPD 1 each 99  ? sodium chloride (OCEAN) 0.65 % SOLN nasal spray Place 1 spray into both nostrils daily as needed (Dry nose).    ? Tiotropium Bromide Monohydrate (SPIRIVA RESPIMAT) 2.5 MCG/ACT AERS Inhale 2 puffs into the lungs daily. 12 g 3  ? albuterol (PROVENTIL) (2.5 MG/3ML) 0.083% nebulizer solution Take 3 mLs (2.5 mg total) by nebulization every 6 (six) hours as needed for wheezing or shortness of breath. 50 mL 99  ? ?No facility-administered medications prior to visit.  ? ? ?Review of Systems  ?Constitutional:  Negative for chills, diaphoresis, fever, malaise/fatigue and weight loss.  ?HENT:  Negative for congestion.   ?Respiratory:  Positive for shortness of breath. Negative for cough, hemoptysis, sputum production and wheezing.   ?Cardiovascular:  Negative for chest pain, palpitations and leg swelling.  ? ?Objective:  ? ?Vitals:  ? 05/29/21 0907  ?BP: 114/80  ?Pulse: 80  ?Temp: 98.7 ?F (37.1 ?C)  ?TempSrc: Oral  ?SpO2: 93%  ?Weight: 124 lb (56.2 kg)  ?Height: '4\' 11"'   (1.499 m)  ? ?SpO2: 93 % ?O2 Device: None (Room air) ? ?Physical Exam: ?General: Well-appearing, no acute distress ?HENT: [place], AT ?Eyes: EOMI, no scleral icterus ?Respiratory: Clear to auscultation bilaterally.  No crackles, wheezing or rales ?Cardiovascular: RRR, -M/R/G, no JVD ?Extremities:-Edema,-tenderness ?Neuro: AAO x4, CNII-XII grossly intact ?Psych: Normal mood, normal affect ? ?Data Reviewed: ? ?Imaging: ?10/2015 CT chest [facility] multiple calcified granulomatous largest is 8 mm, 2 noncalcified 3 mm left lower lobe nodules ?11/2016 CT chest 6-63m nodules Right upper an lower nodules ([place]   ?04/13/19 CT Lung Screen -  Bilateral calcified granulomas bilaterally. Several noncalcified tiny nodules with largest 4m, unchanged. Centrilobular emphysema ?05/16/20 CT Lung Screen -Unchanged bilateral small calcified and uncalcified nodules <328m Centrilobular emphysema ? ?PFT: ?[facility] FEV1 1.03 L 45% predicted, no air trapping, diffusion capacity 35% predicted ? ?Ambulatory O2: ?02/15/21 - No desaturations. SpO2 Nadir 90% ?   ?Assessment & Plan:  ? ?Discussion: ?[age]ear old female with severe COPD and emphysema, subcentimeter lung nodules and history stage I IDC of the left breast with recurrence s/p lumpectomy and radiation who presents for follow-up. Last COPD exacerbation in October and December 2022. Mild symptoms post-covid but has not used nebulizers. We discussed oxygen use including with exertion if needed ? ?Severe COPD - symptomatic ?CONTINUE Symbicort 160-4.5 mcg 2 puffs twice daily.  ?CONTINUE Spiriva 2.5 mcg 2 puffs daily ?REFILLED Albuterol nebulizer ?Encourage regular aerobic activity ?  ?Exertional hypoxemia ?Nocturnal oxygen ?Wear 2L oxygen via nasal cannula for goal SpO2 >88% ?Wean with sleep ? ?Multiple lung nodules - stable ?Annual lung screen ? ?If [place] office re-opens in the  future, she would interested in changing ? ?Health Maintenance ?Immunization History  ?Administered Date(s) Administered  ? Influenza, Seasonal, Injecte, Preservative Fre 10/15/2016  ? Influenza,inj,Quad PF,6

## 2021-06-12 ENCOUNTER — Other Ambulatory Visit: Payer: Self-pay | Admitting: *Deleted

## 2021-06-12 DIAGNOSIS — Z87891 Personal history of nicotine dependence: Secondary | ICD-10-CM

## 2021-06-12 DIAGNOSIS — Z122 Encounter for screening for malignant neoplasm of respiratory organs: Secondary | ICD-10-CM

## 2021-06-20 ENCOUNTER — Ambulatory Visit (INDEPENDENT_AMBULATORY_CARE_PROVIDER_SITE_OTHER): Payer: Medicare Other

## 2021-06-20 DIAGNOSIS — Z122 Encounter for screening for malignant neoplasm of respiratory organs: Secondary | ICD-10-CM

## 2021-06-20 DIAGNOSIS — Z87891 Personal history of nicotine dependence: Secondary | ICD-10-CM | POA: Diagnosis not present

## 2021-06-22 ENCOUNTER — Other Ambulatory Visit: Payer: Self-pay

## 2021-06-22 DIAGNOSIS — Z122 Encounter for screening for malignant neoplasm of respiratory organs: Secondary | ICD-10-CM

## 2021-06-22 DIAGNOSIS — Z87891 Personal history of nicotine dependence: Secondary | ICD-10-CM

## 2021-06-22 IMAGING — DX DG SHOULDER 1V*L*
1 series · 1 of 1 positions shown · non-contrast
Comparison: Plain films left shoulder 01/05/2020.

CLINICAL DATA: Status post left shoulder replacement today.

EXAM:
LEFT SHOULDER

[shoulder ap]
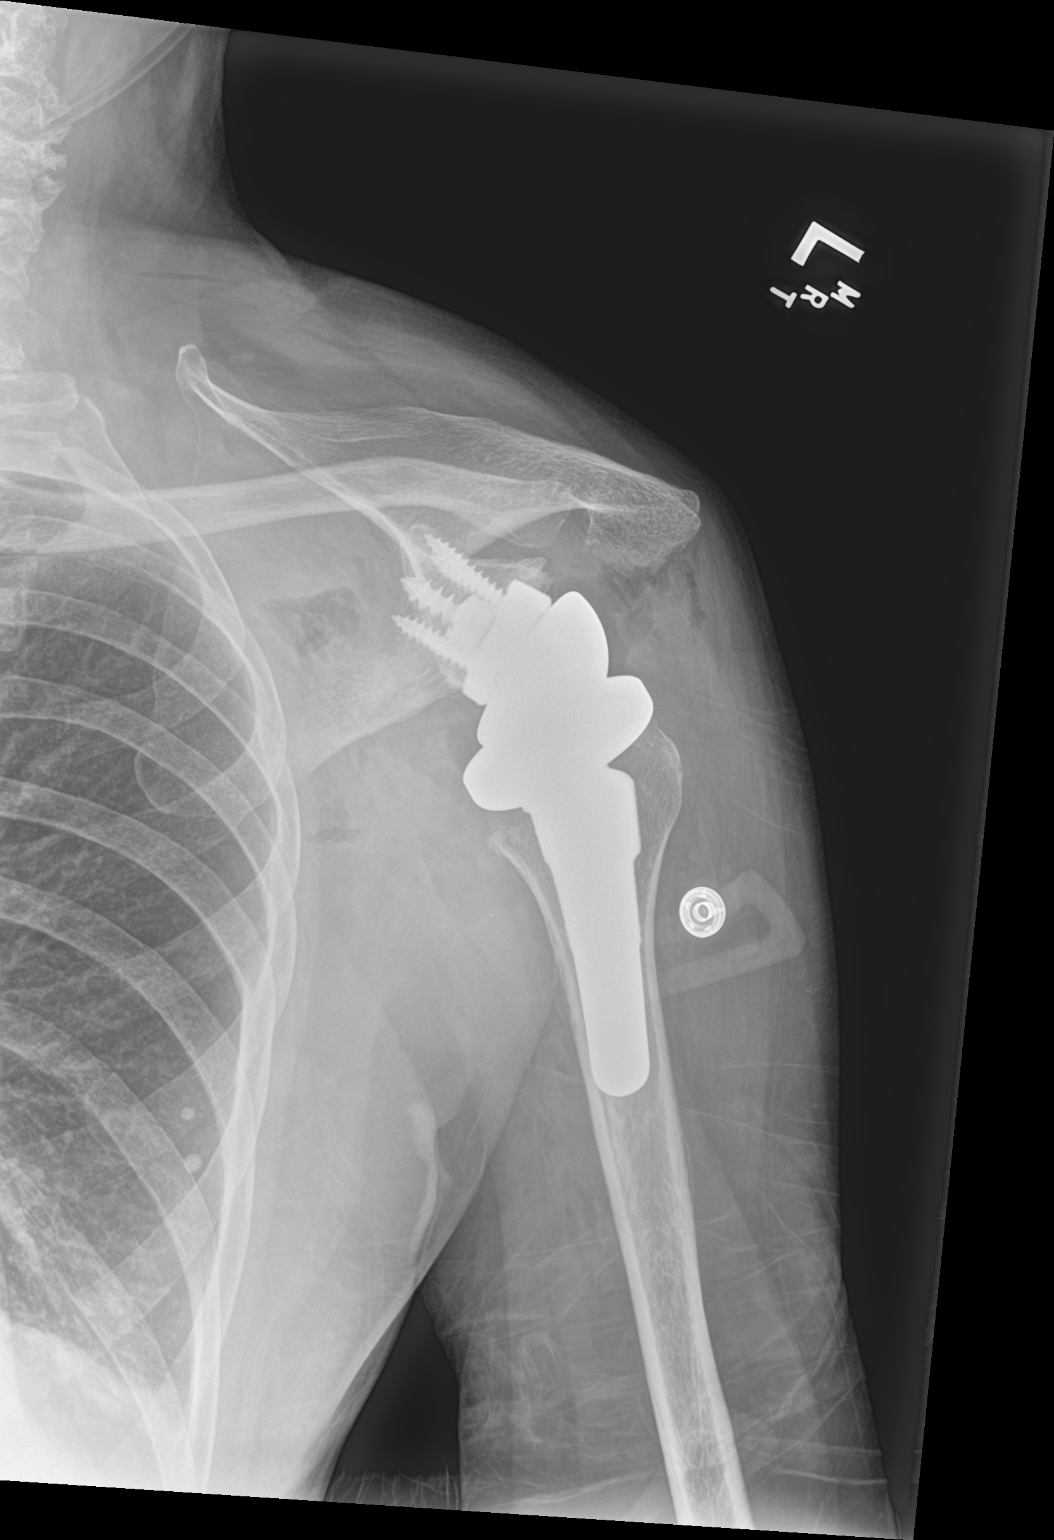

[1 of 1 positions shown; findings below may reference images not displayed]

FINDINGS: New reverse arthroplasty is in place. The device is located. No
fracture. Gas in the soft tissues from surgery noted.
IMPRESSION: Status post left shoulder arthroplasty.  No acute finding.

## 2021-06-23 ENCOUNTER — Telehealth: Payer: Self-pay

## 2021-06-23 NOTE — Telephone Encounter (Signed)
Initiated Prior authorization TKT:[id number] '20MG'$  tablets Via: Covermymeds Case/Key:[id number] Status: approved as of 06/23/21 Reason:Drug is covered by current benefit plan. No further PA activity needed Notified Pt via: Mychart

## 2021-07-03 ENCOUNTER — Ambulatory Visit: Payer: BC Managed Care – PPO | Admitting: Sports Medicine

## 2021-07-03 ENCOUNTER — Ambulatory Visit (INDEPENDENT_AMBULATORY_CARE_PROVIDER_SITE_OTHER): Payer: BC Managed Care – PPO

## 2021-07-03 DIAGNOSIS — M25572 Pain in left ankle and joints of left foot: Secondary | ICD-10-CM

## 2021-07-03 DIAGNOSIS — W010XXA Fall on same level from slipping, tripping and stumbling without subsequent striking against object, initial encounter: Secondary | ICD-10-CM

## 2021-07-03 DIAGNOSIS — S99922A Unspecified injury of left foot, initial encounter: Secondary | ICD-10-CM

## 2021-07-03 NOTE — Progress Notes (Signed)
    Procedures performed today:    None.  Independent interpretation of notes and tests performed by another provider:   None.  Brief History, Exam, Impression, and Recommendations:    Injury of left foot This is a very pleasant 61 year old female, about a week and half ago she was on vacation, some family members have a potty training child who missed the toilet and left a puddle of urine on the floor, unfortunately she slipped in this puddle of urine inverting her left foot. She was unable to bear weight initially, and had severe pain dorsal lateral midfoot, fifth metatarsal base. She did develop bruising and swelling. Continued through vacation. Today she has significant swelling, pain at the fifth metatarsal as well as over the cuboid. We will get some x-rays, add a cam boot. She will do icing and elevation. I suspect 6 to 8 weeks of immobilization followed by PT. Return to see me in about 6 to 8 weeks.    ___________________________________________ Gwen Her. Dianah Field, M.D., ABFM., CAQSM. Primary Care and [place] Instructor of [place] [facility]

## 2021-07-03 NOTE — Assessment & Plan Note (Addendum)
This is a very pleasant 61 year old female, about a week and half ago she was on vacation, some family members have a potty training child who missed the toilet and left a puddle of urine on the floor, unfortunately she slipped in this puddle of urine inverting her left foot. She was unable to bear weight initially, and had severe pain dorsal lateral midfoot, fifth metatarsal base. She did develop bruising and swelling. Continued through vacation. Today she has significant swelling, pain at the fifth metatarsal as well as over the cuboid. We will get some x-rays, add a cam boot. She will do icing and elevation. I suspect 6 to 8 weeks of immobilization followed by PT. Return to see me in about 6 to 8 weeks.

## 2021-08-04 ENCOUNTER — Telehealth: Payer: Self-pay

## 2021-08-04 NOTE — Telephone Encounter (Signed)
Recall received from Dr Ardis Hughs for pt to have repeat EUS July 23 for stomach lesion

## 2021-08-07 ENCOUNTER — Other Ambulatory Visit: Payer: Self-pay

## 2021-08-07 DIAGNOSIS — K3189 Other diseases of stomach and duodenum: Secondary | ICD-10-CM

## 2021-08-07 NOTE — Telephone Encounter (Signed)
EUS scheduled, pt instructed and medications reviewed.  Patient instructions mailed to home and sent to My Chart .  Patient to call with any questions or concerns.  

## 2021-08-16 ENCOUNTER — Ambulatory Visit: Payer: BC Managed Care – PPO | Admitting: Sports Medicine

## 2021-08-28 ENCOUNTER — Other Ambulatory Visit: Payer: Self-pay | Admitting: Family Medicine

## 2021-09-18 ENCOUNTER — Other Ambulatory Visit: Payer: Self-pay | Admitting: Osteopathic Medicine

## 2021-09-18 DIAGNOSIS — E1169 Type 2 diabetes mellitus with other specified complication: Secondary | ICD-10-CM

## 2021-09-26 ENCOUNTER — Other Ambulatory Visit: Payer: Self-pay | Admitting: Gastroenterology

## 2021-10-01 ENCOUNTER — Other Ambulatory Visit: Payer: Self-pay | Admitting: Family Medicine

## 2021-10-30 ENCOUNTER — Ambulatory Visit (HOSPITAL_COMMUNITY): Admit: 2021-10-30 | Payer: BC Managed Care – PPO | Admitting: Gastroenterology

## 2021-10-30 ENCOUNTER — Encounter (HOSPITAL_COMMUNITY): Payer: Self-pay

## 2021-10-30 ENCOUNTER — Encounter: Payer: Self-pay | Admitting: Pulmonary Disease

## 2021-10-30 ENCOUNTER — Ambulatory Visit: Payer: Medicare Other | Admitting: Pulmonary Disease

## 2021-10-30 VITALS — BP 118/70 | HR 97 | Ht 59.0 in | Wt 125.0 lb

## 2021-10-30 DIAGNOSIS — J9611 Chronic respiratory failure with hypoxia: Secondary | ICD-10-CM

## 2021-10-30 DIAGNOSIS — R918 Other nonspecific abnormal finding of lung field: Secondary | ICD-10-CM

## 2021-10-30 DIAGNOSIS — J449 Chronic obstructive pulmonary disease, unspecified: Secondary | ICD-10-CM

## 2021-10-30 DIAGNOSIS — J441 Chronic obstructive pulmonary disease with (acute) exacerbation: Secondary | ICD-10-CM

## 2021-10-30 SURGERY — UPPER ENDOSCOPIC ULTRASOUND (EUS) RADIAL
Anesthesia: Monitor Anesthesia Care

## 2021-10-30 MED ORDER — PREDNISONE 20 MG PO TABS: 40.0000 mg | ORAL_TABLET | Freq: Every day | ORAL | 0 refills | Status: AC

## 2021-10-30 NOTE — Patient Instructions (Signed)
  Severe COPD  COPD exacerbation CONTINUE Symbicort 160-4.5 mcg 2 puffs twice daily.  CONTINUE Spiriva 2.5 mcg 2 puffs daily CONTINUE Albuterol nebulizer Encourage regular aerobic activity   Exertional hypoxemia Nocturnal oxygen Wear oxygen nasal cannula for goal SpO2 >88% Continue nocturnal oxygen  Multiple lung nodules - stable Annual lung screen  Follow-up with me in 6 months

## 2021-10-30 NOTE — Progress Notes (Signed)
Subjective:   PATIENT ID: Sherri Gibson GENDER: female DOB: February 16, 1960, MRN: [id number]   HPI  Chief Complaint  Patient presents with   Follow-up    Congestion, coughing up a lot of gunk     Reason for Visit: Follow-up  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 [place] Gibson patient, last seen September 2019.  She was initially diagnosed with COPD in 2008/03/30 and has been on oxygen since 2015/03/31. She has been compliant with her Symbicort 160-4.5 mcg 2 puffs twice daily and Spiriva 2.5 mcg 2 puffs daily. She lives in a 3 story house and will need to wear her oxygen with it. She walks regularly on her treadmill. She is able to walk upstairs and around the house. Able to do yardwork. No limitations in activity except when needing to carry things due to her shoulder issues. She wears her oxygen overnight but unsure if she actually drops.   11/25/20 Her husband is in hospice so she is unable to leave her home for medical appointments right now. Her family had RSV which led to her having productive cough, nasal congestion, wheezing and shortness of breath. Has improved with hot tea and inhalers. No longer having symptoms. She has purchased a Marine scientist and has been measuring her home O2 around 93%. Wears her oxygen nightly and with activity if it drops below 88%.   February 17, 2021 Her husband passed away 12/04/2022. After Thanksgiving she was went to the ED for hypoxemia secondary to COPD exacerbation. Treated with steroids and nebs. Followed up by her PCP with given doxycycline and cough syrup. Denies wheezing. Occasional dry cough. Shortness of breath with exertion including walking upstairs so she wears oxygen more frequently lately. Has not been active recently, preferring to stay at home.  Has had difficulty sleeping. Her niece will check on her periodically.  05/29/21 Since our last visit COVID in March with cough and nasal congestion. Took steroids. Did not take paxlovid due to being out of the window. Has noticed that she needs her oxygen in the evening ~3 times a week for SpO2 <88%. Has not been using nebulizers recently when she was previously using it 1-2 times daily  10/30/21 Two days ago she started having productive cough, chest congestion, sinus congestion. Her oxygen levels are 92%. Has not needed oxygen. Denies wheezing. Compliant with oxygen at night. No exacerbations since winter 2022. She recently had to put down her dog that she rescued with her past husband.   Social History: She smoked for many years, typically 2 packs per day from age [age].  She quit with Chantix in 30-Mar-2016. 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  Past Medical History:  Diagnosis Date   Anemia    as a child   Arthritis    Both Shoulders   Breast cancer (Sherri Gibson) 5638   Left    Complication of anesthesia    states she had a hard time staying awake after hand surgery   COPD (chronic obstructive pulmonary disease) ([place])    uses O2 at 2l at night   High blood pressure    High cholesterol    History of kidney stones    Personal history of radiation therapy  2016   Left Breast Cancer   Pneumonia    as a child   Pre-diabetes    Prediabetes    Wears dentures    upper dentures     Outpatient Medications Prior to Visit  Medication Sig Dispense Refill   albuterol (PROVENTIL) (2.5 MG/3ML) 0.083% nebulizer solution Take 3 mLs (2.5 mg total) by nebulization every 6 (six) hours as needed for wheezing or shortness of breath. 99 mL 1   albuterol (VENTOLIN HFA) 108 (90 Base) MCG/ACT inhaler Inhale 2 puffs into the lungs every 4 (four) hours as needed for wheezing or shortness of breath. 18 g 4   amLODipine  (NORVASC) 10 MG tablet Take 1 tablet (10 mg total) by mouth daily. 90 tablet 3   aspirin EC 81 MG tablet Take 81 mg by mouth every evening. Swallow whole.     atorvastatin (LIPITOR) 40 MG tablet Take 1 tablet (40 mg total) by mouth every evening. 90 tablet 3   BELSOMRA 20 MG TABS TAKE 20 MG BY MOUTH AT BEDTIME AS NEEDED. 30 tablet 2   budesonide-formoterol (SYMBICORT) 160-4.5 MCG/ACT inhaler Inhale 2 puffs into the lungs in the morning and at bedtime. 30.6 g 3   Calcium Carb-Cholecalciferol (CALCIUM 600-D PO) Take 1 tablet by mouth in the morning and at bedtime.     cyclobenzaprine (FLEXERIL) 10 MG tablet Take 10 mg by mouth 3 (three) times daily.     loratadine (CLARITIN) 10 MG tablet TAKE 1 TABLET BY MOUTH EVERY DAY 90 tablet 1   losartan (COZAAR) 50 MG tablet Take 1 tablet (50 mg total) by mouth daily. 90 tablet 3   metFORMIN (GLUCOPHAGE) 500 MG tablet TAKE 2 TABLETS DAILY 180 tablet 1   Multiple Vitamins-Minerals (MULTIVITAMIN WITH MINERALS) tablet Take 1 tablet by mouth daily.     OXYGEN Inhale 2 L into the lungs at bedtime.     pantoprazole (PROTONIX) 40 MG tablet TAKE 1 TABLET TWICE A DAY 90 tablet 0   Respiratory Therapy Supplies (NEBULIZER AIR TUBE/PLUGS) MISC As directed w/ nebulizer for Dx COPD 1 each 99   Respiratory Therapy Supplies (NEBULIZER) DEVI As directed for Dx COPD 1 each 99   sodium chloride (OCEAN) 0.65 % SOLN nasal spray Place 1 spray into both nostrils daily as needed (Dry nose).     Tiotropium Bromide Monohydrate (SPIRIVA RESPIMAT) 2.5 MCG/ACT AERS Inhale 2 puffs into the lungs daily. 12 g 3   No facility-administered medications prior to visit.    Review of Systems  HENT:  Positive for congestion and sinus pain.   Respiratory:  Positive for cough, sputum production and shortness of breath. Negative for hemoptysis and wheezing.     Objective:   Vitals:   10/30/21 0820  BP: 118/70  Pulse: 97  SpO2: 93%  Weight: 125 lb (56.7 kg)  Height: '4\' 11"'  (1.499 m)    SpO2: 93 % (wearing a mask) O2 Device: None (Room air)  Physical Exam: General: Well-appearing, no acute distress HENT: [place], AT Eyes: EOMI, no scleral icterus Respiratory: Diminished otherwise clear to auscultation bilaterally.  No crackles, wheezing or rales Cardiovascular: RRR, -M/R/G, no JVD Extremities:-Edema,-tenderness Neuro: AAO x4, CNII-XII grossly intact Psych: Normal mood, normal affect  Data Reviewed:  Imaging: 10/2015 CT chest [facility] multiple calcified granulomatous largest is 8 mm, 2 noncalcified 3 mm left lower lobe nodules 11/2016 CT chest 6-58m nodules Right upper an lower nodules ([place]  04/13/19 CT Lung Screen - Bilateral calcified granulomas bilaterally. Several noncalcified tiny nodules with largest 552m, unchanged. Centrilobular emphysema 05/16/20 CT Lung Screen -Unchanged bilateral small calcified and uncalcified nodules <377m Centrilobular emphysema 06/20/21 CT Lung Screen - Unchanged bilateral calcified and uncalcified nodules <52m85mPFT: 2017 PFT from [facility]% FEV1 1.03 L 45% predicted, no air trapping, diffusion capacity 35% predicted  Ambulatory O2: 02/15/21 - No desaturations. SpO2 [place] 90%    Assessment & Plan:   Discussion: [age]ar old female with severe COPD and emphysema, subcentimeter lung nodules and hx stage I IDC of the left breast with recurrence s/p lumpectomy and radiation who presents for follow-up. In mild exacerbation. Last exacerbation 12/2020 before this.  Severe COPD  COPD exacerbation Prednisone 40 mg x 5 days CONTINUE Symbicort 160-4.5 mcg 2 puffs twice daily.  CONTINUE Spiriva 2.5 mcg 2 puffs daily CONTINUE Albuterol nebulizer Encourage regular aerobic activity Discussed vaccinations. UTD on influenza, COVID, RSV and pneumococcal   Exertional hypoxemia Nocturnal oxygen Wear oxygen nasal cannula for goal SpO2 >88% Continue  nocturnal oxygen  Multiple lung nodules - stable Annual lung screen   Health Maintenance Immunization History  Administered Date(s) Administered   Influenza, Seasonal, Injecte, Preservative Fre 10/15/2016   Influenza,inj,Quad PF,6+ Mos 10/15/2016, 09/23/2017, 09/16/2018, 10/14/2019, 10/04/2020   PFIZER(Purple Top)SARS-COV-2 Vaccination 04/14/2019, 05/05/2019, 12/05/2019   PNEUMOCOCCAL CONJUGATE-20 12/19/2020   Pneumococcal Polysaccharide-23 05/29/2017   Tdap 05/29/2017   Zoster Recombinat (Shingrix) 04/08/2020, 08/13/2020   CT Lung Screen - Due 04/2021  No orders of the defined types were placed in this encounter.  Meds ordered this encounter  Medications   predniSONE (DELTASONE) 20 MG tablet    Sig: Take 2 tablets (40 mg total) by mouth daily with breakfast for 5 days.    Dispense:  10 tablet    Refill:  0   Return in about 6 months (around 05/01/2022).  I have spent a total time of 31-minutes on the day of the appointment including chart review, data review, collecting history, coordinating care and discussing medical diagnosis and plan with the patient/family. Past medical history, allergies, medications were reviewed. Pertinent imaging, labs and tests included in this note have been reviewed and interpreted independently by me.  [facility] 10/30/2021 8:37 AM  Office Number [phone]

## 2021-11-07 ENCOUNTER — Other Ambulatory Visit: Payer: Self-pay | Admitting: Gastroenterology

## 2021-11-29 ENCOUNTER — Other Ambulatory Visit: Payer: Self-pay | Admitting: Family Medicine

## 2021-11-30 NOTE — Telephone Encounter (Signed)
Refill request for belsomra Last filled 08/28/2021 Last o.v. 02/01/2021 video visit with dr . Zigmund Daniel No scheduled upcoming appt

## 2021-12-06 ENCOUNTER — Other Ambulatory Visit: Payer: Self-pay | Admitting: Osteopathic Medicine

## 2021-12-06 ENCOUNTER — Other Ambulatory Visit: Payer: Self-pay | Admitting: Sports Medicine

## 2021-12-06 DIAGNOSIS — E782 Mixed hyperlipidemia: Secondary | ICD-10-CM

## 2021-12-07 ENCOUNTER — Encounter: Payer: Self-pay | Admitting: Family Medicine

## 2021-12-07 ENCOUNTER — Telehealth (INDEPENDENT_AMBULATORY_CARE_PROVIDER_SITE_OTHER): Payer: PPO | Admitting: Family Medicine

## 2021-12-07 VITALS — Ht 59.0 in | Wt 123.0 lb

## 2021-12-07 DIAGNOSIS — J441 Chronic obstructive pulmonary disease with (acute) exacerbation: Secondary | ICD-10-CM

## 2021-12-07 MED ORDER — PREDNISONE 50 MG PO TABS: ORAL_TABLET | ORAL | 0 refills | Status: DC

## 2021-12-07 MED ORDER — DOXYCYCLINE HYCLATE 100 MG PO TABS: 100.0000 mg | ORAL_TABLET | Freq: Two times a day (BID) | ORAL | 0 refills | Status: DC

## 2021-12-07 MED ORDER — HYDROCOD POLI-CHLORPHE POLI ER 10-8 MG/5ML PO SUER: 5.0000 mL | Freq: Two times a day (BID) | ORAL | 0 refills | Status: DC | PRN

## 2021-12-07 NOTE — Progress Notes (Signed)
Sherri Gibson - 61 y.o. female MRN [id number]  Date of birth: 1960-10-18   This visit type was conducted due to national recommendations for restrictions regarding the COVID-19 Pandemic (e.g. social distancing).  This format is felt to be most appropriate for this patient at this time.  All issues noted in this document were discussed and addressed.  No physical exam was performed (except for noted visual exam findings with Video Visits).  I discussed the limitations of evaluation and management by telemedicine and the availability of in person appointments. The patient expressed understanding and agreed to proceed.  I connected withNAME@ on 12/07/21 at 11:30 AM EST by a video enabled telemedicine application and verified that I am speaking with the correct person using two identifiers.  Present at visit: Luetta Nutting, DO [name]   Patient Location: Home  [place] [address]   Provider location:   [facility]  Chief Complaint  Patient presents with   Cough   chest congestion    HPI  Sherri Gibson is a 61 y.o. female who presents via audio/video conferencing for a telehealth visit today.  She reports having cough, congestion with increased mucus production as well as wheezing and mild shortness of breath.  Her symptoms began a few days ago.  She has tested negative for COVID at home.  She denies any fever or chills.  No sinus pain or pressure.  She does get some relief from her albuterol inhaler but has needed to use the more frequently.   ROS:  A comprehensive ROS was completed and negative except as noted per HPI  Past Medical History:  Diagnosis Date   Anemia    as a child   Arthritis    Both Shoulders   Breast cancer ([place]) 6503   Left    Complication of anesthesia    states she had a hard time staying awake after hand surgery   COPD (chronic obstructive pulmonary disease) ([place])    uses O2 at 2l at night   High blood pressure     High cholesterol    History of kidney stones    Personal history of radiation therapy 2016   Left Breast Cancer   Pneumonia    as a child   Pre-diabetes    Prediabetes    Wears dentures    upper dentures    Past Surgical History:  Procedure Laterality Date   BIOPSY  07/11/2020   Procedure: BIOPSY;  Surgeon: Lavena Bullion, DO;  Location: [facility];  Service: Gastroenterology;;   BIOPSY  08/11/2020   Procedure: BIOPSY;  Surgeon: Milus Banister, MD;  Location: [facility];  Service: Endoscopy;;   BREAST BIOPSY     BREAST LUMPECTOMY Left 2021   BREAST LUMPECTOMY WITH AXILLARY LYMPH NODE BIOPSY  2016   COLONOSCOPY  2017   [facility]. think there was colon polyps   COLONOSCOPY WITH PROPOFOL N/A 07/11/2020   Procedure: COLONOSCOPY WITH PROPOFOL;  Surgeon: Lavena Bullion, DO;  Location: [facility];  Service: Gastroenterology;  Laterality: N/A;   ESOPHAGOGASTRODUODENOSCOPY (EGD) WITH PROPOFOL N/A 07/11/2020   Procedure: ESOPHAGOGASTRODUODENOSCOPY (EGD) WITH PROPOFOL;  Surgeon: Lavena Bullion, DO;  [facility]: [facility];  Service: Gastroenterology;  Laterality: N/A;   ESOPHAGOGASTRODUODENOSCOPY (EGD) WITH PROPOFOL N/A 08/11/2020   Procedure: ESOPHAGOGASTRODUODENOSCOPY (EGD) WITH PROPOFOL;  Surgeon: Milus Banister, MD;  Location: [facility];  Service: Endoscopy;  Laterality: N/A;   EUS N/A 08/11/2020  Procedure: UPPER ENDOSCOPIC ULTRASOUND (EUS) RADIAL;  Surgeon: Milus Banister, MD;  Location: [facility];  Service: Endoscopy;  Laterality: N/A;   HAND SURGERY Left    PARTIAL HYSTERECTOMY     SAVORY DILATION N/A 07/11/2020   Procedure: SAVORY DILATION;  Surgeon: Lavena Bullion, DO;  Location: [facility];  Service: Gastroenterology;  Laterality: N/A;   TOTAL SHOULDER ARTHROPLASTY Left 04/12/2020   Procedure: LEFT REVERSE TOTAL SHOULDER ARTHROPLASTY;  Surgeon: Meredith Pel, MD;  Location: [place];  Service:  Orthopedics;  Laterality: Left;   TOTAL SHOULDER ARTHROPLASTY Right 09/13/2020   Procedure: RIGHT TOTAL SHOULDER ARTHROPLASTY;  Surgeon: Meredith Pel, MD;  Location: [place];  Service: Orthopedics;  Laterality: Right;    Family History  Problem Relation Age of Onset   Breast cancer Mother    Diabetes Brother    Colon cancer Neg Hx    Esophageal cancer Neg Hx     Social History   Socioeconomic History   Marital status: Widowed    Spouse name: Not on file   Number of children: Not on file   Years of education: Not on file   Highest education level: Not on file  Occupational History   Not on file  Tobacco Use   Smoking status: Former    Packs/day: 2.50    Years: 44.00    Total pack years: 110.00    Types: Cigarettes    Quit date: 11/29/2016    Years since quitting: 5.0   Smokeless tobacco: Never  Vaping Use   Vaping Use: Never used  Substance and Sexual Activity   Alcohol use: Not Currently   Drug use: Never   Sexual activity: Yes    Partners: Male    Birth control/protection: None  Other Topics Concern   Not on file  Social History Narrative   Not on file   Social Determinants of Health   Financial Resource Strain: Not on file  Food Insecurity: Not on file  Transportation Needs: Not on file  Physical Activity: Not on file  Stress: Not on file  Social Connections: Not on file  Intimate Partner Violence: Not on file     Current Outpatient Medications:    chlorpheniramine-HYDROcodone ([place]) 10-8 MG/5ML, Take 5 mLs by mouth every 12 (twelve) hours as needed for cough., Disp: 125 mL, Rfl: 0   doxycycline (VIBRA-TABS) 100 MG tablet, Take 1 tablet (100 mg total) by mouth 2 (two) times daily., Disp: 20 tablet, Rfl: 0   predniSONE (DELTASONE) 50 MG tablet, Take 1 tab po daily x5 days, Disp: 5 tablet, Rfl: 0   albuterol (PROVENTIL) (2.5 MG/3ML) 0.083% nebulizer solution, Take 3 mLs (2.5 mg total) by nebulization every 6 (six) hours as needed for wheezing or  shortness of breath., Disp: 99 mL, Rfl: 1   albuterol (VENTOLIN HFA) 108 (90 Base) MCG/ACT inhaler, Inhale 2 puffs into the lungs every 4 (four) hours as needed for wheezing or shortness of breath., Disp: 18 g, Rfl: 4   amLODipine (NORVASC) 10 MG tablet, Take 1 tablet (10 mg total) by mouth daily., Disp: 90 tablet, Rfl: 3   aspirin EC 81 MG tablet, Take 81 mg by mouth every evening. Swallow whole., Disp: , Rfl:    atorvastatin (LIPITOR) 40 MG tablet, TAKE 1 TABLET EVERY EVENING, Disp: 90 tablet, Rfl: 0   BELSOMRA 20 MG TABS, TAKE 20 MG BY MOUTH AT BEDTIME AS NEEDED., Disp: 30 tablet, Rfl: 2   budesonide-formoterol (SYMBICORT) 160-4.5 MCG/ACT inhaler, Inhale 2  puffs into the lungs in the morning and at bedtime., Disp: 30.6 g, Rfl: 3   Calcium Carb-Cholecalciferol (CALCIUM 600-D PO), Take 1 tablet by mouth in the morning and at bedtime., Disp: , Rfl:    cyclobenzaprine (FLEXERIL) 10 MG tablet, Take 10 mg by mouth 3 (three) times daily., Disp: , Rfl:    loratadine (CLARITIN) 10 MG tablet, TAKE 1 TABLET BY MOUTH EVERY DAY, Disp: 90 tablet, Rfl: 1   losartan (COZAAR) 50 MG tablet, Take 1 tablet (50 mg total) by mouth daily., Disp: 90 tablet, Rfl: 3   metFORMIN (GLUCOPHAGE) 500 MG tablet, TAKE 2 TABLETS DAILY, Disp: 180 tablet, Rfl: 1   Multiple Vitamins-Minerals (MULTIVITAMIN WITH MINERALS) tablet, Take 1 tablet by mouth daily., Disp: , Rfl:    OXYGEN, Inhale 2 L into the lungs at bedtime., Disp: , Rfl:    pantoprazole (PROTONIX) 40 MG tablet, TAKE 1 TABLET TWICE A DAY, Disp: 90 tablet, Rfl: 0   Respiratory Therapy Supplies (NEBULIZER AIR TUBE/PLUGS) MISC, As directed w/ nebulizer for Dx COPD, Disp: 1 each, Rfl: 99   Respiratory Therapy Supplies (NEBULIZER) DEVI, As directed for Dx COPD, Disp: 1 each, Rfl: 99   sodium chloride (OCEAN) 0.65 % SOLN nasal spray, Place 1 spray into both nostrils daily as needed (Dry nose)., Disp: , Rfl:    Tiotropium Bromide Monohydrate (SPIRIVA RESPIMAT) 2.5 MCG/ACT AERS,  Inhale 2 puffs into the lungs daily., Disp: 12 g, Rfl: 3  EXAM:  VITALS per patient if applicable: Ht '4\' 11"'$  (1.499 m)   Wt 123 lb (55.8 kg)   BMI 24.84 kg/m   GENERAL: alert, oriented, appears well and in no acute distress  HEENT: atraumatic, conjunttiva clear, no obvious abnormalities on inspection of external nose and ears  NECK: normal movements of the head and neck  LUNGS: on inspection no signs of respiratory distress, breathing rate appears normal, no obvious gross SOB, gasping or wheezing  CV: no obvious cyanosis  MS: moves all visible extremities without noticeable abnormality  PSYCH/NEURO: pleasant and cooperative, no obvious depression or anxiety, speech and thought processing grossly intact  ASSESSMENT AND PLAN:  Discussed the following assessment and plan:  COPD with exacerbation (HCC) Recommend continuation of current inhalers.  Adding doxycycline and prednisone burst.  Tussionex as needed for cough.  Contact clinic for in person visit if symptoms continue to worsen.     I discussed the assessment and treatment plan with the patient. The patient was provided an opportunity to ask questions and all were answered. The patient agreed with the plan and demonstrated an understanding of the instructions.   The patient was advised to call back or seek an in-person evaluation if the symptoms worsen or if the condition fails to improve as anticipated.    Luetta Nutting, DO

## 2021-12-07 NOTE — Assessment & Plan Note (Signed)
Recommend continuation of current inhalers.  Adding doxycycline and prednisone burst.  Tussionex as needed for cough.  Contact clinic for in person visit if symptoms continue to worsen.

## 2021-12-07 NOTE — Progress Notes (Signed)
Symptoms started Sunday, 12/03/21  Symptoms: cough, headache, mucus, difficulty sleeping due to cough at night  Meds: Mucinex, Tylenol, Cough syrup (from last year)

## 2021-12-14 ENCOUNTER — Other Ambulatory Visit: Payer: Self-pay

## 2021-12-14 ENCOUNTER — Encounter: Payer: Self-pay | Admitting: Hematology & Oncology

## 2021-12-14 ENCOUNTER — Inpatient Hospital Stay: Payer: PPO | Admitting: Hematology & Oncology

## 2021-12-14 ENCOUNTER — Inpatient Hospital Stay: Payer: PPO | Attending: Hematology & Oncology

## 2021-12-14 VITALS — BP 129/79 | HR 88 | Temp 98.5°F | Resp 18 | Ht 59.0 in | Wt 120.8 lb

## 2021-12-14 DIAGNOSIS — J449 Chronic obstructive pulmonary disease, unspecified: Secondary | ICD-10-CM | POA: Insufficient documentation

## 2021-12-14 DIAGNOSIS — Z923 Personal history of irradiation: Secondary | ICD-10-CM | POA: Diagnosis not present

## 2021-12-14 DIAGNOSIS — Z853 Personal history of malignant neoplasm of breast: Secondary | ICD-10-CM | POA: Insufficient documentation

## 2021-12-14 DIAGNOSIS — C50912 Malignant neoplasm of unspecified site of left female breast: Secondary | ICD-10-CM

## 2021-12-14 DIAGNOSIS — C50011 Malignant neoplasm of nipple and areola, right female breast: Secondary | ICD-10-CM | POA: Diagnosis not present

## 2021-12-14 LAB — CBC WITH DIFFERENTIAL (CANCER CENTER ONLY)
Abs Immature Granulocytes: 0.22 10*3/uL — ABNORMAL HIGH (ref 0.00–0.07)
Basophils Absolute: 0.1 10*3/uL (ref 0.0–0.1)
Basophils Relative: 0 %
Eosinophils Absolute: 0.4 10*3/uL (ref 0.0–0.5)
Eosinophils Relative: 3 %
HCT: 42.7 % (ref 36.0–46.0)
Hemoglobin: 13.8 g/dL (ref 12.0–15.0)
Immature Granulocytes: 2 %
Lymphocytes Relative: 17 %
Lymphs Abs: 2.4 10*3/uL (ref 0.7–4.0)
MCH: 27.5 pg (ref 26.0–34.0)
MCHC: 32.3 g/dL (ref 30.0–36.0)
MCV: 85.2 fL (ref 80.0–100.0)
Monocytes Absolute: 1.1 10*3/uL — ABNORMAL HIGH (ref 0.1–1.0)
Monocytes Relative: 8 %
Neutro Abs: 10 10*3/uL — ABNORMAL HIGH (ref 1.7–7.7)
Neutrophils Relative %: 70 %
Platelet Count: 457 10*3/uL — ABNORMAL HIGH (ref 150–400)
RBC: 5.01 MIL/uL (ref 3.87–5.11)
RDW: 14.6 % (ref 11.5–15.5)
WBC Count: 14.1 10*3/uL — ABNORMAL HIGH (ref 4.0–10.5)
nRBC: 0 % (ref 0.0–0.2)

## 2021-12-14 LAB — CMP (CANCER CENTER ONLY)
ALT: 15 U/L (ref 0–44)
AST: 15 U/L (ref 15–41)
Albumin: 4.2 g/dL (ref 3.5–5.0)
Alkaline Phosphatase: 85 U/L (ref 38–126)
Anion gap: 9 (ref 5–15)
BUN: 19 mg/dL (ref 8–23)
CO2: 32 mmol/L (ref 22–32)
Calcium: 9.6 mg/dL (ref 8.9–10.3)
Chloride: 100 mmol/L (ref 98–111)
Creatinine: 0.92 mg/dL (ref 0.44–1.00)
GFR, Estimated: >60 mL/min (ref >60)
Glucose, Bld: 123 mg/dL — ABNORMAL HIGH (ref 70–99)
Potassium: 4.3 mmol/L (ref 3.5–5.1)
Sodium: 141 mmol/L (ref 135–145)
Total Bilirubin: 0.8 mg/dL (ref 0.3–1.2)
Total Protein: 7.2 g/dL (ref 6.5–8.1)

## 2021-12-14 NOTE — Progress Notes (Signed)
Hematology and Oncology Follow Up Visit  Sherri Gibson [id number] [birth year] 61 y.o. 12/14/2021   Principle Diagnosis:  1. 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 MaTates here today for her annual follow-up.  So far, she is doing okay.  Her husband passed away a year ago.  He had cancer.  He was [facility] She is managing.  She and a niece and 2 nephe[place]  They will be the nephews fifth birthday.  I am sure they will have a wonderful time.  She does have COPD.  She is not smoking.  She has a little bit of a cough.  She has had a decent year despite her husband passed away.  [place]..  She had a mammogram back in March.  Everything was fine on the mammogram.  She has had no problems with rashes.  There is been no leg swelling.  She has had no fever.  She has had no bleeding.  There is been no change in bowel or bladder habits.  Overall, I would say performance status is probably ECOG 1.     Medications:  Allergies as of 12/14/2021       Reactions   Codeine Itching   Feels like something crawling on her skin   Tetanus Toxoids Swelling   Patient stated,"I was given a Pneumonia shot at the same time I got the Tetanus shot, in the same arm."        Medication List        Accurate as of December 14, 2021  8:07 AM. If you have any questions, ask your nurse or doctor.          STOP taking these medications    predniSONE 50 MG tablet Commonly known as: DELTASONE Stopped  by: PeVolanda NapoleonMD       TAKE these medications    albuterol 108 (90 Base) MCG/ACT inhaler Commonly known as: VENTOLIN HFA Inhale 2 puffs into the lungs every 4 (four) hours as needed for wheezing or shortness of breath.   albuterol (2.5 MG/3ML) 0.083% nebulizer solution Commonly known as: PROVENTIL Take 3 mLs (2.5 mg total) by nebulization every 6 (six) hours as needed for wheezing or shortness of breath.   amLODipine 10 MG tablet Commonly known as: NORVASC Take 1 tablet (10 mg total) by mouth daily.   aspirin EC 81 MG tablet Take 81 mg by mouth every evening. Swallow whole.   atorvastatin 40 MG tablet Commonly known as: LIPITOR TAKE 1 TABLET EVERY EVENING   Belsomra 20 MG Tabs Generic drug: Suvorexant TAKE 20 MG BY MOUTH AT BEDTIME AS NEEDED.   budesonide-formoterol 160-4.5 MCG/ACT inhaler Commonly known as: Symbicort Inhale 2 puffs into the lungs in the morning and at bedtime.   CALCIUM 600-D PO Take 1 tablet by mouth in the morning and at bedtime.   chlorpheniramine-HYDROcodone 10-8 MG/5ML Commonly known as: TUSSIONEX Take 5  mLs by mouth every 12 (twelve) hours as needed for cough.   cyclobenzaprine 10 MG tablet Commonly known as: FLEXERIL Take 10 mg by mouth 3 (three) times daily.   doxycycline 100 MG tablet Commonly known as: VIBRA-TABS Take 1 tablet (100 mg total) by mouth 2 (two) times daily.   loratadine 10 MG tablet Commonly known as: CLARITIN TAKE 1 TABLET BY MOUTH EVERY DAY   losartan 50 MG tablet Commonly known as: COZAAR Take 1 tablet (50 mg total) by mouth daily.   metFORMIN 500 MG tablet Commonly known as: GLUCOPHAGE TAKE 2 TABLETS DAILY   multivitamin with minerals tablet Take 1 tablet by mouth daily.   Nebulizer Devi As directed for Dx COPD   Nebulizer Air Tube/Plugs Misc As directed w/ nebulizer for Dx COPD   OXYGEN Inhale 2 L into the lungs at bedtime.   pantoprazole 40 MG tablet Commonly known as: PROTONIX TAKE 1  TABLET TWICE A DAY   sodium chloride 0.65 % Soln nasal spray Commonly known as: OCEAN Place 1 spray into both nostrils daily as needed (Dry nose).   Spiriva Respimat 2.5 MCG/ACT Aers Generic drug: Tiotropium Bromide Monohydrate Inhale 2 puffs into the lungs daily.        Allergies:  Allergies  Allergen Reactions   Codeine Itching    Feels like something crawling on her skin    Tetanus Toxoids Swelling    Patient stated,"I was given a Pneumonia shot at the same time I got the Tetanus shot, in the same arm."    Past Medical History, Surgical history, Social history, and Family History were reviewed and updated.  Review of Systems: Review of Systems  Constitutional: Negative.   HENT: Negative.    Eyes: Negative.   Respiratory:  Positive for cough.   Cardiovascular: Negative.   Gastrointestinal: Negative.   Genitourinary: Negative.   Musculoskeletal: Negative.   Skin: Negative.   Neurological: Negative.   Endo/Heme/Allergies: Negative.   Psychiatric/Behavioral: Negative.       Physical Exam:  vitals were not taken for this visit.   Wt Readings from Last 3 Encounters:  12/07/21 123 lb (55.8 kg)  10/30/21 125 lb (56.7 kg)  05/29/21 124 lb (56.2 kg)    Physical Exam Vitals reviewed.  Constitutional:      Comments: Her right breast shows no masses, edema or erythema.  There is no right axillary adenopathy.  Left breast shows a well-healed lumpectomy at about the 1 o'clock position.  There is a well-healed left lymphadenectomy scar.  There is no erythema or warmth in the left breast.  There is no left nipple discharge.  HENT:     Head: Normocephalic and atraumatic.  Eyes:     Pupils: Pupils are equal, round, and reactive to light.  Cardiovascular:     Rate and Rhythm: Normal rate and regular rhythm.     Heart sounds: Normal heart sounds.  Pulmonary:     Effort: Pulmonary effort is normal.     Breath sounds: Normal breath sounds.  Abdominal:     General: Bowel  sounds are normal.     Palpations: Abdomen is soft.  Musculoskeletal:        General: No tenderness or deformity. Normal range of motion.     Cervical back: Normal range of motion.  Lymphadenopathy:     Cervical: No cervical adenopathy.  Skin:    General: Skin is warm and dry.     Findings: No erythema or rash.  Neurological:  Mental Status: She is alert and oriented to person, place, and time.  Psychiatric:        Behavior: Behavior normal.        Thought Content: Thought content normal.        Judgment: Judgment normal.      Lab Results  Component Value Date   WBC 14.1 (H) 12/14/2021   HGB 13.8 12/14/2021   HCT 42.7 12/14/2021   MCV 85.2 12/14/2021   PLT 457 (H) 12/14/2021   No results found for: "FERRITIN", "IRON", "TIBC", "UIBC", "IRONPCTSAT" Lab Results  Component Value Date   RBC 5.01 12/14/2021   No results found for: "KPAFRELGTCHN", "LAMBDASER", "KAPLAMBRATIO" No results found for: "IGGSERUM", "IGA", "IGMSERUM" No results found for: "TOTALPROTELP", "ALBUMINELP", "A1GS", "A2GS", "BETS", "BETA2SER", "GAMS", "MSPIKE", "SPEI"   Chemistry      Component Value Date/Time   NA 135 12/31/2020 1140   NA 139 10/09/2017 0000   K 3.8 12/31/2020 1140   CL 99 12/31/2020 1140   CL 103 10/09/2017 0000   CO2 26 12/31/2020 1140   CO2 22 10/09/2017 0000   BUN 9 12/31/2020 1140   BUN 17 10/09/2017 0000   CREATININE 0.62 12/31/2020 1140   CREATININE 0.89 12/14/2020 0916   CREATININE 0.86 03/03/2020 0000   GLU 119 10/09/2017 0000      Component Value Date/Time   CALCIUM 9.0 12/31/2020 1140   CALCIUM 9.2 10/09/2017 0000   ALKPHOS 86 12/31/2020 1140   AST 24 12/31/2020 1140   AST 19 12/14/2020 0916   ALT 23 12/31/2020 1140   ALT 15 12/14/2020 0916   BILITOT 0.6 12/31/2020 1140   BILITOT 0.3 12/14/2020 0916       Impression and Plan: Ms. Hodak is a very pleasant 61 yo caucasian female with history of right breast papillomas and atypical ductal hyperplasia in  2011 s/p lumpectomy in 06/2009 as well as history of stage I (pT1pN0Mx) IDC of the left breast diagnosed 04/2014, ER/PR+, HER2- .  She was initially treated with lumpectomy followed by adjuvant radiation therapy. She started Arimidex in October 2016 and finished out her 5 years in December 2021.   I see no evidence of recurrent disease.  I would have to think that her risk of recurrence should be less than 10%.  I feel bad for her poor husband.  I know that he fought hard with his cancer.  We will plan to get her back in another year.  Volanda Napoleon, MD 11/16/20238:07 AM

## 2021-12-15 ENCOUNTER — Other Ambulatory Visit (HOSPITAL_COMMUNITY): Payer: Self-pay

## 2021-12-25 ENCOUNTER — Telehealth: Payer: Self-pay | Admitting: Gastroenterology

## 2021-12-25 ENCOUNTER — Other Ambulatory Visit (HOSPITAL_COMMUNITY): Payer: Self-pay

## 2021-12-25 ENCOUNTER — Other Ambulatory Visit: Payer: Self-pay

## 2021-12-25 MED ORDER — PANTOPRAZOLE SODIUM 40 MG PO TBEC
40.0000 mg | DELAYED_RELEASE_TABLET | Freq: Two times a day (BID) | ORAL | 0 refills | Status: DC
  Filled 2021-12-25: qty 60, 30d supply, fill #0

## 2021-12-25 NOTE — Telephone Encounter (Signed)
Protonix sent to [facility] outpatient pharmacy. Patient is informed.

## 2021-12-25 NOTE — Telephone Encounter (Signed)
Inbound call from patient requesting an appointment for a medication refill for Protonix. Patient was scheduled for 12/5 at 9:40 and is requesting refill to be sent to [place]. Please advise.

## 2021-12-26 ENCOUNTER — Other Ambulatory Visit (HOSPITAL_COMMUNITY): Payer: Self-pay

## 2021-12-28 ENCOUNTER — Other Ambulatory Visit (HOSPITAL_COMMUNITY): Payer: Self-pay

## 2021-12-28 ENCOUNTER — Telehealth: Payer: Self-pay

## 2021-12-28 DIAGNOSIS — J441 Chronic obstructive pulmonary disease with (acute) exacerbation: Secondary | ICD-10-CM

## 2021-12-28 DIAGNOSIS — I10 Essential (primary) hypertension: Secondary | ICD-10-CM

## 2021-12-28 MED ORDER — MONTELUKAST SODIUM 10 MG PO TABS
10.0000 mg | ORAL_TABLET | Freq: Every day | ORAL | 3 refills | Status: DC
  Filled 2021-12-28: qty 90, 90d supply, fill #0

## 2021-12-28 MED ORDER — LOSARTAN POTASSIUM 50 MG PO TABS
50.0000 mg | ORAL_TABLET | Freq: Every day | ORAL | 1 refills | Status: DC
  Filled 2021-12-28: qty 90, 90d supply, fill #0
  Filled 2022-03-24: qty 90, 90d supply, fill #1

## 2021-12-28 MED ORDER — AMLODIPINE BESYLATE 10 MG PO TABS
10.0000 mg | ORAL_TABLET | Freq: Every day | ORAL | 1 refills | Status: DC
  Filled 2021-12-28: qty 90, 90d supply, fill #0

## 2021-12-28 NOTE — Telephone Encounter (Signed)
Patient came into office to get her pharmacy changed to [place], please advise, patient will be going out of town next week and would like all of her medications changed over to the pharmacy above thanks!

## 2022-01-02 ENCOUNTER — Encounter: Payer: Self-pay | Admitting: Gastroenterology

## 2022-01-02 ENCOUNTER — Other Ambulatory Visit (HOSPITAL_COMMUNITY): Payer: Self-pay

## 2022-01-02 ENCOUNTER — Ambulatory Visit (INDEPENDENT_AMBULATORY_CARE_PROVIDER_SITE_OTHER): Payer: PPO | Admitting: Gastroenterology

## 2022-01-02 VITALS — BP 124/70 | HR 91 | Ht 59.0 in | Wt 123.0 lb

## 2022-01-02 DIAGNOSIS — J449 Chronic obstructive pulmonary disease, unspecified: Secondary | ICD-10-CM

## 2022-01-02 DIAGNOSIS — K3189 Other diseases of stomach and duodenum: Secondary | ICD-10-CM

## 2022-01-02 DIAGNOSIS — Z86010 Personal history of colonic polyps: Secondary | ICD-10-CM | POA: Diagnosis not present

## 2022-01-02 DIAGNOSIS — K219 Gastro-esophageal reflux disease without esophagitis: Secondary | ICD-10-CM

## 2022-01-02 MED ORDER — PANTOPRAZOLE SODIUM 40 MG PO TBEC
40.0000 mg | DELAYED_RELEASE_TABLET | Freq: Two times a day (BID) | ORAL | 5 refills | Status: DC
  Filled 2022-01-02 – 2022-01-31 (×2): qty 180, 90d supply, fill #0
  Filled 2022-04-26: qty 180, 90d supply, fill #1
  Filled 2022-07-29: qty 180, 90d supply, fill #2
  Filled 2022-12-03: qty 180, 90d supply, fill #3

## 2022-01-02 NOTE — Progress Notes (Unsigned)
Chief Complaint:    GERD, medication refill  GI History: 61 y.o. female w/ a hx of Stage I left Breast CA dx-ed 2016 s/p lumpectomy with adjuvant RT and Arimidex, severe COPD and emphysema (requires nocturnal home O2), HTN, HLD, IFG, follows in GI clinic for the following:  1) History of colon polyps.  Index colonoscopy was ~2017 in [place], [place] at [facility] and n/f polyps and inadequate prep per patient, with recommendation for 1 year repeat, but she subsequently moved to [facility] Colonoscopy in 06/2020 with moderate amounts of stool.  Recommended repeat in 1 year with 2-day bowel prep.   2) Chronic constipation.  Well-controlled with laxative prn and when she increases water intake.  3) GERD with erosive esophagitis: EGD and 06/2020 with LA Grade A esophagitis, otherwise normal esophagus which was dilated with 18 mm Savary with appropriate mucosal rent in the upper esophagus, mild nonulcer gastritis, 6 mm nodule in the gastric fundus (path: Benign with slight hyperemia).  Treated with high-dose PPI x6 weeks  4) Dysphagia.  Started with intermittent pill dysphagia and late 2021.  Resolved with EGD with esophageal dilation  5) Gastric nodule.  Initially seen on EGD in 06/2020.  EUS 07/2020 with 8.5 mm subepithelial lesion in the gastric fundus which was biopsied (path: Benign with focal chronic inflammation).  If results are nondiagnostic, recommended repeat EUS in 1 year, and if still unchanged, repeat in 2 years for surveillance  HPI:     Patient is a 61 y.o. female presenting to the Gastroenterology Clinic for follow-up and medication refill of Protonix.    She ran out of her Protonix recently and had breakthrough reflux symptoms.  Was able to pick up new Rx and has restarted Protonix 40 mg bid with good relief.  Leaving for [place] later this week with her niece/nephew and their twin boys.   Sadly, her husband passed away 12/20/20.   Was scheduled for EUS in 10/2021, but patient canceled  due to needing foot surgery around same time.     Latest Ref Rng & Units 12/14/2021    7:41 AM 12/31/2020   11:40 AM 12/14/2020    9:16 AM  CBC  WBC 4.0 - 10.5 K/uL 14.1  12.6  8.0   Hemoglobin 12.0 - 15.0 g/dL 13.8  11.6  12.5   Hematocrit 36.0 - 46.0 % 42.7  36.7  40.2   Platelets 150 - 400 K/uL 457  433  360       Latest Ref Rng & Units 12/14/2021    7:41 AM 12/31/2020   11:40 AM 12/14/2020    9:16 AM  CMP  Glucose 70 - 99 mg/dL 123  186  182   BUN 8 - 23 mg/dL '19  9  16   '$ Creatinine 0.44 - 1.00 mg/dL 0.92  0.62  0.89   Sodium 135 - 145 mmol/L 141  135  139   Potassium 3.5 - 5.1 mmol/L 4.3  3.8  4.8   Chloride 98 - 111 mmol/L 100  99  101   CO2 22 - 32 mmol/L 32  26  31   Calcium 8.9 - 10.3 mg/dL 9.6  9.0  9.7   Total Protein 6.5 - 8.1 g/dL 7.2  7.8  7.2   Total Bilirubin 0.3 - 1.2 mg/dL 0.8  0.6  0.3   Alkaline Phos 38 - 126 U/L 85  86  81   AST 15 - 41 U/L 15  24  19  ALT 0 - 44 U/L '15  23  15      '$ Review of systems:     No chest pain, no SOB, no fevers, no urinary sx   Past Medical History:  Diagnosis Date   Anemia    as a child   Arthritis    Both Shoulders   Breast cancer ([place]) 1941   Left    Complication of anesthesia    states she had a hard time staying awake after hand surgery   COPD (chronic obstructive pulmonary disease) ([place])    uses O2 at 2l at night   High blood pressure    High cholesterol    History of kidney stones    Personal history of radiation therapy 2016   Left Breast Cancer   Pneumonia    as a child   Pre-diabetes    Prediabetes    Wears dentures    upper dentures    Patient's surgical history, family medical history, social history, medications and allergies were all reviewed in Epic    Current Outpatient Medications  Medication Sig Dispense Refill   albuterol (PROVENTIL) (2.5 MG/3ML) 0.083% nebulizer solution Take 3 mLs (2.5 mg total) by nebulization every 6 (six) hours as needed for wheezing or shortness of breath. 99 mL  1   albuterol (VENTOLIN HFA) 108 (90 Base) MCG/ACT inhaler Inhale 2 puffs into the lungs every 4 (four) hours as needed for wheezing or shortness of breath. 18 g 4   amLODipine (NORVASC) 10 MG tablet Take 1 tablet (10 mg total) by mouth daily. 90 tablet 1   aspirin EC 81 MG tablet Take 81 mg by mouth every evening. Swallow whole.     atorvastatin (LIPITOR) 40 MG tablet TAKE 1 TABLET EVERY EVENING 90 tablet 0   BELSOMRA 20 MG TABS TAKE 20 MG BY MOUTH AT BEDTIME AS NEEDED. 30 tablet 2   budesonide-formoterol (SYMBICORT) 160-4.5 MCG/ACT inhaler Inhale 2 puffs into the lungs in the morning and at bedtime. 30.6 g 3   Calcium Carb-Cholecalciferol (CALCIUM 600-D PO) Take 1 tablet by mouth in the morning and at bedtime.     cyclobenzaprine (FLEXERIL) 10 MG tablet Take 10 mg by mouth 3 (three) times daily.     loratadine (CLARITIN) 10 MG tablet TAKE 1 TABLET BY MOUTH EVERY DAY 90 tablet 1   losartan (COZAAR) 50 MG tablet Take 1 tablet (50 mg total) by mouth daily. 90 tablet 1   metFORMIN (GLUCOPHAGE) 500 MG tablet TAKE 2 TABLETS DAILY 180 tablet 1   montelukast (SINGULAIR) 10 MG tablet Take 1 tablet (10 mg total) by mouth at bedtime. 90 tablet 3   Multiple Vitamins-Minerals (MULTIVITAMIN WITH MINERALS) tablet Take 1 tablet by mouth daily.     OXYGEN Inhale 2 L into the lungs at bedtime.     pantoprazole (PROTONIX) 40 MG tablet Take 1 tablet (40 mg total) by mouth 2 (two) times daily. 60 tablet 0   Respiratory Therapy Supplies (NEBULIZER AIR TUBE/PLUGS) MISC As directed w/ nebulizer for Dx COPD 1 each 99   Respiratory Therapy Supplies (NEBULIZER) DEVI As directed for Dx COPD 1 each 99   sodium chloride (OCEAN) 0.65 % SOLN nasal spray Place 1 spray into both nostrils daily as needed (Dry nose).     Tiotropium Bromide Monohydrate (SPIRIVA RESPIMAT) 2.5 MCG/ACT AERS Inhale 2 puffs into the lungs daily. 12 g 3   No current facility-administered medications for this visit.    Physical Exam:  BP 124/70  (BP Location: Right Arm)   Pulse 91   Ht '4\' 11"'$  (1.499 m)   Wt 123 lb (55.8 kg)   BMI 24.84 kg/m   GENERAL:  Pleasant female in NAD PSYCH: : Cooperative, normal affect NEURO: Alert and oriented x 3, no focal neurologic deficits   IMPRESSION and PLAN:    1) GERD - Send in Rx for Protonix 40 mg PO BID, 90-day supply, RF5 - Continue with high-dose bid dosing for at least the next 4 weeks, then can try to titrate back down to daily dosing as tolerated -Continue antireflux lifestyle/dietary modifications with avoidance of exacerbating foods  2) Gastric nodule - Due for repeat EUS.  She canceled this earlier this year due to needing foot surgery at the same time - Plan to schedule EUS with Dr. Rush Landmark.  She prefers to wait until 03/2022 at the earliest.  We will coordinate scheduling with his staff  3) History of colon polyps - Due for repeat colonoscopy for continued surveillance - Schedule colonoscopy at [facility].  She prefers to wait until 03/2022 at the earliest.  She also prefers to not schedule at the same time as EUS - Plan for extended 2-day bowel preparation due to prior history of fair prep x2  4) Severe COPD on home O2 at night 5) Emphysema - Procedures to be scheduled at [facility] due to elevated periprocedural risks        Lavena Bullion ,DO, FACG 01/02/2022, 10:30 AM

## 2022-01-02 NOTE — Patient Instructions (Addendum)
We have sent the following medications to your pharmacy for you to pick up at your convenience: Protonix 40 MG  You will get a call from our office to schedule EUS with Dr. Rush Landmark and Colonoscopy with Dr. Bryan Lemma at the hospital in March or later dates.  If you are age 61 or younger, your body mass index should be between 19-25. Your Body mass index is 24.84 kg/m. If this is out of the aformentioned range listed, please consider follow up with your Primary Care Provider.   __________________________________________________________  The [facility] providers would like to encourage you to use [facility] to communicate with providers for non-urgent requests or questions.  Due to long hold times on the telephone, sending your provider a message by [facility] may be a faster and more efficient way to get a response.  Please allow 48 business hours for a response.  Please remember that this is for non-urgent requests.   Due to recent changes in healthcare laws, you may see the results of your imaging and laboratory studies on MyChart before your provider has had a chance to review them.  We understand that in some cases there may be results that are confusing or concerning to you. Not all laboratory results come back in the same time frame and the provider may be waiting for multiple results in order to interpret others.  Please give [name] 48 hours in order for your provider to thoroughly review all the results before contacting the office for clarification of your results.     Thank you for choosing me and [facility].  Vito Cirigliano, D.O.

## 2022-01-03 ENCOUNTER — Telehealth: Payer: Self-pay

## 2022-01-03 ENCOUNTER — Other Ambulatory Visit (HOSPITAL_COMMUNITY): Payer: Self-pay

## 2022-01-03 ENCOUNTER — Encounter: Payer: Self-pay | Admitting: Family Medicine

## 2022-01-03 MED ORDER — BELSOMRA 20 MG PO TABS
20.0000 mg | ORAL_TABLET | Freq: Every evening | ORAL | 0 refills | Status: DC | PRN
  Filled 2022-01-03: qty 30, 30d supply, fill #0

## 2022-01-03 NOTE — Telephone Encounter (Signed)
Patient came into office to request refill on her [place], patient states that her previous pharmacy [facility] [place] doesn't have the medication and would like to see if she could get it sent to [facility], patient goes out of town on Friday to [place] and wanted to see if she could get medication sent in today!!

## 2022-01-03 NOTE — Telephone Encounter (Signed)
Refilled x30 days.  Will need appt before any additional refills will be issued.

## 2022-01-04 ENCOUNTER — Telehealth: Payer: Self-pay

## 2022-01-04 ENCOUNTER — Other Ambulatory Visit (HOSPITAL_COMMUNITY): Payer: Self-pay

## 2022-01-04 NOTE — Telephone Encounter (Signed)
Recall has been entered as ordered

## 2022-01-04 NOTE — Telephone Encounter (Signed)
-----   Message from Irving Copas., MD sent at 01/04/2022  4:29 AM EST ----- Regarding: RE: EUS OK for EUS in 3/24 or 4/24. Please place on our recall list. Thanks. GM ----- Message ----- From: Timothy Lasso, RN Sent: 01/02/2022  11:06 AM EST To: Irving Copas., MD; Howell Pringle, CMA Subject: FW: EUS                                        I will send to Dr Jerilynn Mages to review.  ----- Message ----- From: Howell Pringle, CMA Sent: 01/02/2022  11:03 AM EST To: Timothy Lasso, RN Subject: EUS                                            Hi Glenisha Gundry, this patient needs EUS in March or April or later per Dr. Bryan Lemma. Thank you.

## 2022-01-15 ENCOUNTER — Telehealth (INDEPENDENT_AMBULATORY_CARE_PROVIDER_SITE_OTHER): Payer: PPO | Admitting: Pulmonary Disease

## 2022-01-15 ENCOUNTER — Other Ambulatory Visit (HOSPITAL_COMMUNITY): Payer: Self-pay

## 2022-01-15 ENCOUNTER — Other Ambulatory Visit: Payer: Self-pay

## 2022-01-15 ENCOUNTER — Encounter (HOSPITAL_BASED_OUTPATIENT_CLINIC_OR_DEPARTMENT_OTHER): Payer: Self-pay | Admitting: Pulmonary Disease

## 2022-01-15 DIAGNOSIS — J449 Chronic obstructive pulmonary disease, unspecified: Secondary | ICD-10-CM

## 2022-01-15 DIAGNOSIS — J441 Chronic obstructive pulmonary disease with (acute) exacerbation: Secondary | ICD-10-CM

## 2022-01-15 MED ORDER — AZITHROMYCIN 250 MG PO TABS: ORAL_TABLET | ORAL | 0 refills | Status: DC

## 2022-01-15 MED ORDER — FLUTICASONE-SALMETEROL 250-50 MCG/ACT IN AEPB
1.0000 | INHALATION_SPRAY | Freq: Two times a day (BID) | RESPIRATORY_TRACT | 2 refills | Status: DC
  Filled 2022-01-15: qty 60, 30d supply, fill #0
  Filled 2022-03-24: qty 60, 30d supply, fill #1
  Filled 2022-06-01: qty 60, 30d supply, fill #2
  Filled 2022-06-29: qty 60, 30d supply, fill #3

## 2022-01-15 MED ORDER — SPIRIVA RESPIMAT 2.5 MCG/ACT IN AERS
2.0000 | INHALATION_SPRAY | Freq: Every day | RESPIRATORY_TRACT | 2 refills | Status: DC
  Filled 2022-01-15: qty 12, 90d supply, fill #0
  Filled 2022-04-11: qty 12, 90d supply, fill #1
  Filled 2022-06-26: qty 4, 30d supply, fill #2

## 2022-01-15 MED ORDER — PREDNISONE 20 MG PO TABS: 40.0000 mg | ORAL_TABLET | Freq: Every day | ORAL | 0 refills | Status: AC

## 2022-01-15 MED ORDER — ALBUTEROL SULFATE HFA 108 (90 BASE) MCG/ACT IN AERS
2.0000 | INHALATION_SPRAY | RESPIRATORY_TRACT | 4 refills | Status: DC | PRN
  Filled 2022-01-15: qty 6.7, 17d supply, fill #0
  Filled 2022-06-26: qty 6.7, 17d supply, fill #1

## 2022-01-15 NOTE — Progress Notes (Signed)
Virtual Visit via Video Note  I connected with Sherri Gibson on 01/15/22 at  9:15 AM EST by a video enabled telemedicine application and verified that I am speaking with the correct person using two identifiers.  Location: Patient: Home Provider: [facility] [place]   I discussed the limitations of evaluation and management by telemedicine and the availability of in person appointments. The patient expressed understanding and agreed to proceed.   I discussed the assessment and treatment plan with the patient. The patient was provided an opportunity to ask questions and all were answered. The patient agreed with the plan and demonstrated an understanding of the instructions.   The patient was advised to call back or seek an in-person evaluation if the symptoms worsen or if the condition fails to improve as anticipated.  I provided 31 minutes of non-face-to-face time during this encounter.   Sherri Tritch Rodman Pickle, MD  Subjective:   PATIENT ID: Sherri Gibson GENDER: female DOB: 1961-01-29, MRN: [id number]   HPI  Chief Complaint  Patient presents with   Follow-up    Went to [facility] and picked up a cough Insurance doesn't cover symbicort and needs to anoro instead   Reason for Visit: Follow-up  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 Lake Bells patient, last seen September 2019.  She was initially diagnosed with COPD in 2010 and has been on oxygen since 2017. She has been compliant with her Symbicort 160-4.5 mcg 2 puffs twice daily and Spiriva 2.5 mcg 2 puffs daily. She lives in a 3 story house and will need to wear her oxygen with it. She walks regularly on her treadmill. She is able to walk upstairs and around the house. Able to do yardwork. No  limitations in activity except when needing to carry things due to her shoulder issues. She wears her oxygen overnight but unsure if she actually drops.   11/25/20 Her husband is in hospice so she is unable to leave her home for medical appointments right now. Her family had RSV which led to her having productive cough, nasal congestion, wheezing and shortness of breath. Has improved with hot tea and inhalers. No longer having symptoms. She has purchased a Marine scientist and has been measuring her home O2 around 93%. Wears her oxygen nightly and with activity if it drops below 88%.   2021/03/09 Her husband passed away December 24, 2022. After Thanksgiving she was went to the ED for hypoxemia secondary to COPD exacerbation. Treated with steroids and nebs. Followed up by her PCP with given doxycycline and cough syrup. Denies wheezing. Occasional dry cough. Shortness of breath with exertion including walking upstairs so she wears oxygen more frequently lately. Has not been active recently, preferring to stay at home. Has had difficulty sleeping. Her niece will check on her periodically.  05/29/21 Since our last visit COVID in March with cough and nasal congestion. Took steroids. Did not take paxlovid due to being out of the window. Has noticed that she needs her oxygen in the evening ~3 times a week for SpO2 <88%. Has not been using nebulizers recently when she was previously using it 1-2 times daily  10/30/21 Two days ago she started having productive cough, chest congestion, sinus congestion. Her oxygen levels are 92%. Has not needed oxygen. Denies wheezing. Compliant with  oxygen at night. No exacerbations since winter 2022. She recently had to put down her dog that she rescued with her past husband.   01/15/22 Last week she went to [place]. Developed a cough four days ago. COVID test neg. Reports productive cough, fatigue and malaise. Has been wearing oxygen at night. Her SpO2 levels 90-91%. Walking 15 ft was  difficult and had to stop due to feeling winded. Reports chest rattle and wheezing. She has more exposure to her nephews and they are in daycare  Social History: She smoked for many years, typically 2 packs per day from age [age].  She quit with Chantix in April 01, 2016. 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  Past Medical History:  Diagnosis Date   Anemia    as a child   Arthritis    Both Shoulders   Breast cancer ([place]) 3846   Left    Complication of anesthesia    states she had a hard time staying awake after hand surgery   COPD (chronic obstructive pulmonary disease) ([place])    uses O2 at 2l at night   High blood pressure    High cholesterol    History of kidney stones    Personal history of radiation therapy Apr 01, 2014   Left Breast Cancer   Pneumonia    as a child   Pre-diabetes    Prediabetes    Wears dentures    upper dentures     Outpatient Medications Prior to Visit  Medication Sig Dispense Refill   albuterol (PROVENTIL) (2.5 MG/3ML) 0.083% nebulizer solution Take 3 mLs (2.5 mg total) by nebulization every 6 (six) hours as needed for wheezing or shortness of breath. 99 mL 1   amLODipine (NORVASC) 10 MG tablet Take 1 tablet (10 mg total) by mouth daily. 90 tablet 1   aspirin EC 81 MG tablet Take 81 mg by mouth every evening. Swallow whole.     atorvastatin (LIPITOR) 40 MG tablet TAKE 1 TABLET EVERY EVENING 90 tablet 0   Calcium Carb-Cholecalciferol (CALCIUM 600-D PO) Take 1 tablet by mouth in the morning and at bedtime.     cyclobenzaprine (FLEXERIL) 10 MG tablet Take 10 mg by mouth 3 (three) times daily.     loratadine (CLARITIN) 10 MG tablet TAKE 1 TABLET BY MOUTH EVERY DAY 90 tablet 1   losartan (COZAAR) 50 MG tablet Take 1 tablet (50 mg total) by mouth daily. 90 tablet 1   metFORMIN (GLUCOPHAGE) 500 MG tablet TAKE 2 TABLETS DAILY 180 tablet 1   montelukast (SINGULAIR) 10 MG tablet Take 1  tablet (10 mg total) by mouth at bedtime. 90 tablet 3   Multiple Vitamins-Minerals (MULTIVITAMIN WITH MINERALS) tablet Take 1 tablet by mouth daily.     OXYGEN Inhale 2 L into the lungs at bedtime.     pantoprazole (PROTONIX) 40 MG tablet Take 1 tablet (40 mg total) by mouth 2 (two) times daily. 180 tablet 5   Respiratory Therapy Supplies (NEBULIZER AIR TUBE/PLUGS) MISC As directed w/ nebulizer for Dx COPD 1 each 99   Respiratory Therapy Supplies (NEBULIZER) DEVI As directed for Dx COPD 1 each 99   sodium chloride (OCEAN) 0.65 % SOLN nasal spray Place 1 spray into both nostrils daily as needed (Dry nose).     Suvorexant (BELSOMRA) 20 MG TABS Take 20 mg by mouth at bedtime as needed. 30 tablet 0   albuterol (VENTOLIN HFA) 108 (  90 Base) MCG/ACT inhaler Inhale 2 puffs into the lungs every 4 (four) hours as needed for wheezing or shortness of breath. 18 g 4   Tiotropium Bromide Monohydrate (SPIRIVA RESPIMAT) 2.5 MCG/ACT AERS Inhale 2 puffs into the lungs daily. 12 g 3   budesonide-formoterol (SYMBICORT) 160-4.5 MCG/ACT inhaler Inhale 2 puffs into the lungs in the morning and at bedtime. (Patient not taking: Reported on 01/15/2022) 30.6 g 3   No facility-administered medications prior to visit.    Review of Systems  HENT:  Positive for congestion and sinus pain.   Respiratory:  Positive for cough, sputum production and shortness of breath. Negative for hemoptysis and wheezing.     Objective:   There were no vitals filed for this visit.    SpO2 91%  Physical Exam: General: Well-appearing, no acute distress Respiratory: No respiratory distress, no tachypnea Neuro: AAO x4, CNII-XII grossly intact Psych: Normal mood, normal affect   Data Reviewed:  Imaging: 10/2015 CT chest [facility] multiple calcified granulomatous largest is 8 mm, 2 noncalcified 3 mm left lower lobe nodules 11/2016 CT chest 6-59m nodules Right upper an lower nodules ([place]   04/13/19 CT Lung Screen - Bilateral calcified granulomas bilaterally. Several noncalcified tiny nodules with largest 366m unchanged. Centrilobular emphysema 05/16/20 CT Lung Screen -Unchanged bilateral small calcified and uncalcified nodules <89m67mCentrilobular emphysema 06/20/21 CT Lung Screen - Unchanged bilateral calcified and uncalcified nodules <89mm66mFT: 2017 [facility] FEV1 1.03 L 45% predicted, no air trapping, diffusion capacity 35% predicted  Ambulatory O2: 02/15/21 - No desaturations. SpO2 Nadir 90%    Assessment & Plan:   Discussion: 61 y72r old female with severe COPD and emphysema, subcentimeter lung nodules and hx stage I IDC of the left breast with recurrence s/p lumpectomy and radiation who presents for follow-up.  She has had exacerbation in October, November and now in December. Likely due to increased viral exposure from preschool-aged nephews. Discussed clinical course and management of COPD including bronchodilator regimen and action plan for exacerbation and avoidance of sick contacts.  Severe COPD  COPD exacerbation Prednisone 40 mg x 5 days Azithromycin x 5 days START Wixela 250-50 mcg ONE puff twice daily.  CONTINUE Spiriva 2.5 mcg 2 puffs daily CONTINUE Albuterol nebulizer AS NEEDED Encourage regular aerobic activity   Exertional hypoxemia Nocturnal oxygen Wear oxygen nasal cannula for goal SpO2 >88% Continue nocturnal oxygen  Multiple lung nodules - stable Annual lung screen   Health Maintenance Immunization History  Administered Date(s) Administered   Influenza, Seasonal, Injecte, Preservative Fre 10/15/2016   Influenza,inj,Quad PF,6+ Mos 10/15/2016, 09/23/2017, 09/16/2018, 10/14/2019, 10/04/2020   Influenza,inj,Quad PF,6-35 Mos 10/20/2021   Moderna Covid Bivalent Peds Booster(67mo 65mo 65yrs)42yr16/2023   PFIZER(Purple Top)SARS-COV-2 Vaccination 04/14/2019, 05/05/2019, 12/05/2019    PNEUMOCOCCAL CONJUGATE-20 12/19/2020   Pneumococcal Polysaccharide-23 05/29/2017   Respiratory Syncytial Virus Vaccine,Recomb Aduvanted(Arexvy) 10/17/2021   Tdap 05/29/2017   Zoster Recombinat (Shingrix) 04/08/2020, 08/13/2020   CT Lung Screen - Due 04/2021  No orders of the defined types were placed in this encounter.  Meds ordered this encounter  Medications   predniSONE (DELTASONE) 20 MG tablet    Sig: Take 2 tablets (40 mg total) by mouth daily with breakfast for 5 days.    Dispense:  10 tablet    Refill:  0   azithromycin (ZITHROMAX) 250 MG tablet    Sig: Take two tablets on day  1, then one tablet daily on day 2-5.    Dispense:  6 tablet    Refill:  0   fluticasone-salmeterol (WIXELA INHUB) 250-50 MCG/ACT AEPB    Sig: Inhale 1 puff into the lungs in the morning and at bedtime.    Dispense:  180 each    Refill:  2   Tiotropium Bromide Monohydrate (SPIRIVA RESPIMAT) 2.5 MCG/ACT AERS    Sig: Inhale 2 puffs into the lungs daily.    Dispense:  12 g    Refill:  2   albuterol (VENTOLIN HFA) 108 (90 Base) MCG/ACT inhaler    Sig: Inhale 2 puffs into the lungs every 4 (four) hours as needed for wheezing or shortness of breath.    Dispense:  18 g    Refill:  4   Return in about 5 months (around 06/16/2022).  I have spent a total time of 31-minutes on the day of the appointment including chart review, data review, collecting history, coordinating care and discussing medical diagnosis and plan with the patient/family. Past medical history, allergies, medications were reviewed. Pertinent imaging, labs and tests included in this note have been reviewed and interpreted independently by me.  [place], MD [facility] 01/15/2022 10:29 AM  Office Number [phone]

## 2022-01-15 NOTE — Patient Instructions (Signed)
  Severe COPD  COPD exacerbation Prednisone 40 mg x 5 days Azithromycin x 5 days START Wixela 250-50 mcg ONE puff twice daily.  CONTINUE Spiriva 2.5 mcg 2 puffs daily CONTINUE Albuterol nebulizer AS NEEDED Encourage regular aerobic activity   Exertional hypoxemia Nocturnal oxygen Wear oxygen nasal cannula for goal SpO2 >88% Continue nocturnal oxygen  Follow-up with me in 5 months

## 2022-01-16 ENCOUNTER — Other Ambulatory Visit: Payer: Self-pay

## 2022-01-16 ENCOUNTER — Other Ambulatory Visit (HOSPITAL_COMMUNITY): Payer: Self-pay

## 2022-01-25 ENCOUNTER — Encounter: Payer: BC Managed Care – PPO | Admitting: Family Medicine

## 2022-01-31 ENCOUNTER — Other Ambulatory Visit (HOSPITAL_COMMUNITY): Payer: Self-pay

## 2022-01-31 ENCOUNTER — Other Ambulatory Visit: Payer: Self-pay

## 2022-02-01 ENCOUNTER — Other Ambulatory Visit (HOSPITAL_COMMUNITY): Payer: Self-pay

## 2022-02-01 ENCOUNTER — Encounter: Payer: Self-pay | Admitting: Family Medicine

## 2022-02-01 ENCOUNTER — Ambulatory Visit (INDEPENDENT_AMBULATORY_CARE_PROVIDER_SITE_OTHER): Payer: PPO | Admitting: Family Medicine

## 2022-02-01 VITALS — BP 106/68 | HR 94 | Ht 59.0 in | Wt 124.0 lb

## 2022-02-01 DIAGNOSIS — I10 Essential (primary) hypertension: Secondary | ICD-10-CM | POA: Diagnosis not present

## 2022-02-01 DIAGNOSIS — E782 Mixed hyperlipidemia: Secondary | ICD-10-CM | POA: Diagnosis not present

## 2022-02-01 DIAGNOSIS — J441 Chronic obstructive pulmonary disease with (acute) exacerbation: Secondary | ICD-10-CM | POA: Diagnosis not present

## 2022-02-01 DIAGNOSIS — Z0000 Encounter for general adult medical examination without abnormal findings: Secondary | ICD-10-CM

## 2022-02-01 DIAGNOSIS — E785 Hyperlipidemia, unspecified: Secondary | ICD-10-CM

## 2022-02-01 DIAGNOSIS — E1169 Type 2 diabetes mellitus with other specified complication: Secondary | ICD-10-CM | POA: Diagnosis not present

## 2022-02-01 MED ORDER — METFORMIN HCL 500 MG PO TABS
1000.0000 mg | ORAL_TABLET | Freq: Every day | ORAL | 1 refills | Status: DC
  Filled 2022-02-01: qty 180, 90d supply, fill #0
  Filled 2022-06-01: qty 180, 90d supply, fill #1

## 2022-02-01 MED ORDER — BELSOMRA 20 MG PO TABS
20.0000 mg | ORAL_TABLET | Freq: Every evening | ORAL | 4 refills | Status: DC | PRN
  Filled 2022-02-01: qty 30, 30d supply, fill #0

## 2022-02-01 MED ORDER — MONTELUKAST SODIUM 10 MG PO TABS
10.0000 mg | ORAL_TABLET | Freq: Every day | ORAL | 3 refills | Status: DC
  Filled 2022-02-01 – 2022-03-24 (×2): qty 90, 90d supply, fill #0
  Filled 2022-06-20: qty 90, 90d supply, fill #1
  Filled 2022-09-20: qty 90, 90d supply, fill #2
  Filled 2022-12-17: qty 90, 90d supply, fill #3

## 2022-02-01 MED ORDER — AMLODIPINE BESYLATE 10 MG PO TABS
10.0000 mg | ORAL_TABLET | Freq: Every day | ORAL | 1 refills | Status: DC
  Filled 2022-02-01 – 2022-03-24 (×2): qty 90, 90d supply, fill #0
  Filled 2022-06-20: qty 90, 90d supply, fill #1

## 2022-02-01 MED ORDER — METFORMIN HCL 500 MG PO TABS: 1000.0000 mg | ORAL_TABLET | Freq: Every day | ORAL | 1 refills | Status: DC

## 2022-02-01 MED ORDER — AMLODIPINE BESYLATE 10 MG PO TABS: 10.0000 mg | ORAL_TABLET | Freq: Every day | ORAL | 1 refills | Status: DC

## 2022-02-01 MED ORDER — ATORVASTATIN CALCIUM 40 MG PO TABS
40.0000 mg | ORAL_TABLET | Freq: Every evening | ORAL | 1 refills | Status: DC
  Filled 2022-02-01: qty 90, 90d supply, fill #0
  Filled 2022-06-01: qty 90, 90d supply, fill #1

## 2022-02-01 MED ORDER — BELSOMRA 20 MG PO TABS
1.0000 | ORAL_TABLET | Freq: Every evening | ORAL | 4 refills | Status: DC | PRN
  Filled 2022-02-01: qty 30, 30d supply, fill #0
  Filled 2022-03-02: qty 30, 30d supply, fill #1
  Filled 2022-04-01: qty 30, 30d supply, fill #2
  Filled 2022-04-29: qty 30, 30d supply, fill #3
  Filled 2022-06-01: qty 30, 30d supply, fill #4

## 2022-02-01 MED ORDER — MONTELUKAST SODIUM 10 MG PO TABS: 10.0000 mg | ORAL_TABLET | Freq: Every day | ORAL | 3 refills | Status: DC

## 2022-02-01 MED ORDER — ATORVASTATIN CALCIUM 40 MG PO TABS: 40.0000 mg | ORAL_TABLET | Freq: Every evening | ORAL | 1 refills | Status: DC

## 2022-02-01 NOTE — Addendum Note (Signed)
Addended by: Perlie Mayo on: 02/01/2022 09:11 AM   Modules accepted: Orders

## 2022-02-01 NOTE — Progress Notes (Addendum)
Sherri Gibson - 62 y.o. female MRN [id number]  Date of birth: 03-Aug-1960  Subjective Chief Complaint  Patient presents with   Diabetes    HPI Sherri Gibson is a 62 y.o. female here today for annual exam.   She reports that she is doing pretty well.   Continues to see pulmonology for history of COPD.  She does use O2 as needed at home.   Seeing oncology due to history of breast cancer.  Completed Arimidex. Remains UTD on mammograms.    She is overdue for follow up on her diabetes and blood pressure.   She is sleeping pretty well with Belsomra.  Denies over sedation.    She is not very active.  Feels like diet is pretty good.   She quit smoking in 2018.  Denies EtOH use.  Colon cancer screening is UTD  She has had flu, covid and RSV vaccine.   Review of Systems  Constitutional:  Negative for chills, fever, malaise/fatigue and weight loss.  HENT:  Negative for congestion, ear pain and sore throat.   Eyes:  Negative for blurred vision, double vision and pain.  Respiratory:  Negative for cough and shortness of breath.   Cardiovascular:  Negative for chest pain and palpitations.  Gastrointestinal:  Negative for abdominal pain, blood in stool, constipation, heartburn and nausea.  Genitourinary:  Negative for dysuria and urgency.  Musculoskeletal:  Negative for joint pain and myalgias.  Neurological:  Negative for dizziness and headaches.  Endo/Heme/Allergies:  Does not bruise/bleed easily.  Psychiatric/Behavioral:  Negative for depression. The patient is not nervous/anxious and does not have insomnia.      Allergies  Allergen Reactions   Codeine Itching    Feels like something crawling on her skin    Tetanus Toxoids Swelling    Patient stated,"I was given a Pneumonia shot at the same time I got the Tetanus shot, in the same arm."    Past Medical History:  Diagnosis Date   Anemia    as a child   Arthritis    Both Shoulders   Breast cancer ([place]) 3888    Left    Complication of anesthesia    states she had a hard time staying awake after hand surgery   COPD (chronic obstructive pulmonary disease) ([place])    uses O2 at 2l at night   High blood pressure    High cholesterol    History of kidney stones    Personal history of radiation therapy 2016   Left Breast Cancer   Pneumonia    as a child   Pre-diabetes    Prediabetes    Wears dentures    upper dentures    Past Surgical History:  Procedure Laterality Date   BIOPSY  07/11/2020   Procedure: BIOPSY;  Surgeon: Lavena Bullion, DO;  Location: [facility];  Service: Gastroenterology;;   BIOPSY  08/11/2020   Procedure: BIOPSY;  Surgeon: Milus Banister, MD;  Location: [facility];  Service: Endoscopy;;   BREAST BIOPSY     BREAST LUMPECTOMY Left 2021   BREAST LUMPECTOMY WITH AXILLARY LYMPH NODE BIOPSY  2016   COLONOSCOPY  2017   [facility]. think there was colon polyps   COLONOSCOPY WITH PROPOFOL N/A 07/11/2020   Procedure: COLONOSCOPY WITH PROPOFOL;  Surgeon: Lavena Bullion, DO;  Location: [facility];  Service: Gastroenterology;  Laterality: N/A;   ESOPHAGOGASTRODUODENOSCOPY (EGD) WITH PROPOFOL N/A 07/11/2020   Procedure: ESOPHAGOGASTRODUODENOSCOPY (EGD) WITH PROPOFOL;  Surgeon: Lavena Bullion, DO;  [facility]: [facility];  Service: Gastroenterology;  Laterality: N/A;   ESOPHAGOGASTRODUODENOSCOPY (EGD) WITH PROPOFOL N/A 08/11/2020   Procedure: ESOPHAGOGASTRODUODENOSCOPY (EGD) WITH PROPOFOL;  Surgeon: Milus Banister, MD;  Location: [facility];  Service: Endoscopy;  Laterality: N/A;   EUS N/A 08/11/2020   Procedure: UPPER ENDOSCOPIC ULTRASOUND (EUS) RADIAL;  Surgeon: Milus Banister, MD;  Location: [facility];  Service: Endoscopy;  Laterality: N/A;   HAND SURGERY Left    PARTIAL HYSTERECTOMY     SAVORY DILATION N/A 07/11/2020   Procedure: SAVORY DILATION;  Surgeon: Lavena Bullion, DO;  Location: [facility];  Service:  Gastroenterology;  Laterality: N/A;   TOTAL SHOULDER ARTHROPLASTY Left 04/12/2020   Procedure: LEFT REVERSE TOTAL SHOULDER ARTHROPLASTY;  Surgeon: Meredith Pel, MD;  Location: [facility];  Service: Orthopedics;  Laterality: Left;   TOTAL SHOULDER ARTHROPLASTY Right 09/13/2020   Procedure: RIGHT TOTAL SHOULDER ARTHROPLASTY;  Surgeon: Meredith Pel, MD;  Location: [facility];  Service: Orthopedics;  Laterality: Right;    Social History   Socioeconomic History   Marital status: Widowed    Spouse name: Not on file   Number of children: 0   Years of education: Not on file   Highest education level: Not on file  Occupational History   Not on file  Tobacco Use   Smoking status: Former    Packs/day: 2.50    Years: 44.00    Total pack years: 110.00    Types: Cigarettes    Quit date: 11/29/2016    Years since quitting: 5.1   Smokeless tobacco: Never  Vaping Use   Vaping Use: Never used  Substance and Sexual Activity   Alcohol use: Not Currently   Drug use: Never   Sexual activity: Yes    Partners: Male    Birth control/protection: None  Other Topics Concern   Not on file  Social History Narrative   Not on file   Social Determinants of Health   Financial Resource Strain: Not on file  Food Insecurity: Not on file  Transportation Needs: Not on file  Physical Activity: Not on file  Stress: Not on file  Social Connections: Not on file    Family History  Problem Relation Age of Onset   Breast cancer Mother    Lung cancer Father        smoker   Diabetes Brother    Colon cancer Neg Hx    Esophageal cancer Neg Hx     Health Maintenance  Topic Date Due   Diabetic kidney evaluation - Urine ACR  Never done   Hepatitis C Screening  Never done   HEMOGLOBIN A1C  07/04/2021   FOOT EXAM  01/03/2022   Medicare Annual Wellness (AWV)  03/04/2022 (Originally 03/29/1960)   OPHTHALMOLOGY EXAM  05/03/2022 (Originally 12/19/2021)   COVID-19 Vaccine (5 - 2023-24 season) 02/02/2023  (Originally 12/09/2021)   Lung Cancer Screening  06/21/2022   Diabetic kidney evaluation - eGFR measurement  12/15/2022   MAMMOGRAM  04/14/2023   DTaP/Tdap/Td (2 - Td or Tdap) 05/30/2027   COLONOSCOPY (Pts 45-30yr Insurance coverage will need to be confirmed)  07/12/2030   INFLUENZA VACCINE  Completed   HIV Screening  Completed   Zoster Vaccines- Shingrix  Completed   HPV VACCINES  Aged Out   PAP SMEAR-Modifier  Discontinued     ----------------------------------------------------------------------------------------------------------------------------------------------------------------------------------------------------------------- Physical Exam BP 106/68 (BP Location: Right Arm, Patient Position: Sitting, Cuff Size: Normal)   Pulse 94   Ht  _0  (1.499 m)   Wt 124 lb (56.2 kg)   SpO2 94%   BMI 25.04 kg/m   Physical Exam Constitutional:      General: She is not in acute distress. HENT:     Head: Normocephalic and atraumatic.     Right Ear: Tympanic membrane and ear canal normal.     Left Ear: Tympanic membrane and ear canal normal.     Nose: Nose normal.  Eyes:     General: No scleral icterus.    Conjunctiva/sclera: Conjunctivae normal.  Neck:     Thyroid: No thyromegaly.  Cardiovascular:     Rate and Rhythm: Normal rate and regular rhythm.     Heart sounds: Normal heart sounds.  Pulmonary:     Effort: Pulmonary effort is normal.     Breath sounds: Normal breath sounds.  Abdominal:     General: Bowel sounds are normal. There is no distension.     Palpations: Abdomen is soft.     Tenderness: There is no abdominal tenderness. There is no guarding.  Musculoskeletal:        General: Normal range of motion.     Cervical back: Normal range of motion and neck supple.  Lymphadenopathy:     Cervical: No cervical adenopathy.  Skin:    General: Skin is warm and dry.     Findings: No rash.  Neurological:     General: No focal deficit present.     Mental Status: She  is alert and oriented to person, place, and time.     Cranial Nerves: No cranial nerve deficit.     Coordination: Coordination normal.  Psychiatric:        Mood and Affect: Mood normal.        Behavior: Behavior normal.     ------------------------------------------------------------------------------------------------------------------------------------------------------------------------------------------------------------------- Assessment and Plan  Well adult exam Well adult Orders Placed This Encounter  Procedures   COMPLETE METABOLIC PANEL WITH GFR   CBC with Differential   Lipid Panel w/reflex Direct LDL   TSH   HgB A1c   Urine Microalbumin w/creat. ratio  Screenings: per lab orders.  Colonoscopy and mammogram UTD. Immunizations:  UTD Anticipatory guidance/Risk Factor reduction:  Recommendations per AVS.    No orders of the defined types were placed in this encounter.   Return in about 6 months (around 08/02/2022) for HTN/T2DM.    This visit occurred during the SARS-CoV-2 public health emergency.  Safety protocols were in place, including screening questions prior to the visit, additional usage of staff PPE, and extensive cleaning of exam room while observing appropriate contact time as indicated for disinfecting solutions.

## 2022-02-01 NOTE — Addendum Note (Signed)
Addended by: Perlie Mayo on: 02/01/2022 09:06 AM   Modules accepted: Orders

## 2022-02-01 NOTE — Patient Instructions (Signed)
Preventive Care 40-62 Years Old, Female Preventive care refers to lifestyle choices and visits with your health care provider that can promote health and wellness. Preventive care visits are also called wellness exams. What can I expect for my preventive care visit? Counseling Your health care provider may ask you questions about your: Medical history, including: Past medical problems. Family medical history. Pregnancy history. Current health, including: Menstrual cycle. Method of birth control. Emotional well-being. Home life and relationship well-being. Sexual activity and sexual health. Lifestyle, including: Alcohol, nicotine or tobacco, and drug use. Access to firearms. Diet, exercise, and sleep habits. Work and work environment. Sunscreen use. Safety issues such as seatbelt and bike helmet use. Physical exam Your health care provider will check your: Height and weight. These may be used to calculate your BMI (body mass index). BMI is a measurement that tells if you are at a healthy weight. Waist circumference. This measures the distance around your waistline. This measurement also tells if you are at a healthy weight and may help predict your risk of certain diseases, such as type 2 diabetes and high blood pressure. Heart rate and blood pressure. Body temperature. Skin for abnormal spots. What immunizations do I need?  Vaccines are usually given at various ages, according to a schedule. Your health care provider will recommend vaccines for you based on your age, medical history, and lifestyle or other factors, such as travel or where you work. What tests do I need? Screening Your health care provider may recommend screening tests for certain conditions. This may include: Lipid and cholesterol levels. Diabetes screening. This is done by checking your blood sugar (glucose) after you have not eaten for a while (fasting). Pelvic exam and Pap test. Hepatitis B test. Hepatitis C  test. HIV (human immunodeficiency virus) test. STI (sexually transmitted infection) testing, if you are at risk. Lung cancer screening. Colorectal cancer screening. Mammogram. Talk with your health care provider about when you should start having regular mammograms. This may depend on whether you have a family history of breast cancer. BRCA-related cancer screening. This may be done if you have a family history of breast, ovarian, tubal, or peritoneal cancers. Bone density scan. This is done to screen for osteoporosis. Talk with your health care provider about your test results, treatment options, and if necessary, the need for more tests. Follow these instructions at home: Eating and drinking  Eat a diet that includes fresh fruits and vegetables, whole grains, lean protein, and low-fat dairy products. Take vitamin and mineral supplements as recommended by your health care provider. Do not drink alcohol if: Your health care provider tells you not to drink. You are pregnant, may be pregnant, or are planning to become pregnant. If you drink alcohol: Limit how much you have to 0-1 drink a day. Know how much alcohol is in your drink. In the [place] one drink equals one 12 oz bottle of beer (355 mL), one 5 oz glass of wine (148 mL), or one 1 oz glass of hard liquor (44 mL). Lifestyle Brush your teeth every morning and night with fluoride toothpaste. Floss one time each day. Exercise for at least 30 minutes 5 or more days each week. Do not use any products that contain nicotine or tobacco. These products include cigarettes, chewing tobacco, and vaping devices, such as e-cigarettes. If you need help quitting, ask your health care provider. Do not use drugs. If you are sexually active, practice safe sex. Use a condom or other form of protection to   prevent STIs. If you do not wish to become pregnant, use a form of birth control. If you plan to become pregnant, see your health care provider for a  prepregnancy visit. Take aspirin only as told by your health care provider. Make sure that you understand how much to take and what form to take. Work with your health care provider to find out whether it is safe and beneficial for you to take aspirin daily. Find healthy ways to manage stress, such as: Meditation, yoga, or listening to music. Journaling. Talking to a trusted person. Spending time with friends and family. Minimize exposure to UV radiation to reduce your risk of skin cancer. Safety Always wear your seat belt while driving or riding in a vehicle. Do not drive: If you have been drinking alcohol. Do not ride with someone who has been drinking. When you are tired or distracted. While texting. If you have been using any mind-altering substances or drugs. Wear a helmet and other protective equipment during sports activities. If you have firearms in your house, make sure you follow all gun safety procedures. Seek help if you have been physically or sexually abused. What's next? Visit your health care provider once a year for an annual wellness visit. Ask your health care provider how often you should have your eyes and teeth checked. Stay up to date on all vaccines. This information is not intended to replace advice given to you by your health care provider. Make sure you discuss any questions you have with your health care provider. Document Revised: 07/13/2020 Document Reviewed: 07/13/2020 Elsevier Patient Education  [place].

## 2022-02-01 NOTE — Assessment & Plan Note (Signed)
Well adult Orders Placed This Encounter  Procedures   COMPLETE METABOLIC PANEL WITH GFR   CBC with Differential   Lipid Panel w/reflex Direct LDL   TSH   HgB A1c   Urine Microalbumin w/creat. ratio  Screenings: per lab orders.  Colonoscopy and mammogram UTD. Immunizations:  UTD Anticipatory guidance/Risk Factor reduction:  Recommendations per AVS.

## 2022-02-02 ENCOUNTER — Other Ambulatory Visit (HOSPITAL_COMMUNITY): Payer: Self-pay

## 2022-02-02 LAB — CBC WITH DIFFERENTIAL/PLATELET
Absolute Monocytes: 511 cells/uL (ref 200–950)
Basophils Absolute: 37 cells/uL (ref 0–200)
Basophils Relative: 0.5 %
Eosinophils Absolute: 226 cells/uL (ref 15–500)
Eosinophils Relative: 3.1 %
HCT: 41.4 % (ref 35.0–45.0)
Hemoglobin: 13.6 g/dL (ref 11.7–15.5)
Lymphs Abs: 1285 cells/uL (ref 850–3900)
MCH: 27.6 pg (ref 27.0–33.0)
MCHC: 32.9 g/dL (ref 32.0–36.0)
MCV: 84.1 fL (ref 80.0–100.0)
MPV: 10.5 fL (ref 7.5–12.5)
Monocytes Relative: 7 %
Neutro Abs: 5241 cells/uL (ref 1500–7800)
Neutrophils Relative %: 71.8 %
Platelets: 454 10*3/uL — ABNORMAL HIGH (ref 140–400)
RBC: 4.92 10*6/uL (ref 3.80–5.10)
RDW: 13.4 % (ref 11.0–15.0)
Total Lymphocyte: 17.6 %
WBC: 7.3 10*3/uL (ref 3.8–10.8)

## 2022-02-02 LAB — COMPLETE METABOLIC PANEL WITH GFR
AG Ratio: 1.6 (calc) (ref 1.0–2.5)
ALT: 14 U/L (ref 6–29)
AST: 16 U/L (ref 10–35)
Albumin: 4.4 g/dL (ref 3.6–5.1)
Alkaline phosphatase (APISO): 93 U/L (ref 37–153)
BUN: 18 mg/dL (ref 7–25)
CO2: 26 mmol/L (ref 20–32)
Calcium: 9.8 mg/dL (ref 8.6–10.4)
Chloride: 102 mmol/L (ref 98–110)
Creat: 0.75 mg/dL (ref 0.50–1.05)
Globulin: 2.7 g/dL (calc) (ref 1.9–3.7)
Glucose, Bld: 101 mg/dL — ABNORMAL HIGH (ref 65–99)
Potassium: 4.4 mmol/L (ref 3.5–5.3)
Sodium: 139 mmol/L (ref 135–146)
Total Bilirubin: 0.4 mg/dL (ref 0.2–1.2)
Total Protein: 7.1 g/dL (ref 6.1–8.1)
eGFR: 91 mL/min/{1.73_m2} (ref >=60)

## 2022-02-02 LAB — LIPID PANEL W/REFLEX DIRECT LDL
Cholesterol: 220 mg/dL — ABNORMAL HIGH (ref <200)
HDL: 61 mg/dL (ref >=50)
LDL Cholesterol (Calc): 130 mg/dL (calc) — ABNORMAL HIGH
Non-HDL Cholesterol (Calc): 159 mg/dL (calc) — ABNORMAL HIGH (ref <130)
Total CHOL/HDL Ratio: 3.6 (calc) (ref <5.0)
Triglycerides: 176 mg/dL — ABNORMAL HIGH (ref <150)

## 2022-02-02 LAB — HEMOGLOBIN A1C
Hgb A1c MFr Bld: 7 % of total Hgb — ABNORMAL HIGH (ref <5.7)
Mean Plasma Glucose: 154 mg/dL
eAG (mmol/L): 8.5 mmol/L

## 2022-02-02 LAB — TSH: TSH: 1.05 mIU/L (ref 0.40–4.50)

## 2022-02-02 LAB — MICROALBUMIN / CREATININE URINE RATIO
Creatinine, Urine: 18 mg/dL — ABNORMAL LOW (ref 20–275)
Microalb, Ur: <0.2 mg/dL

## 2022-02-28 ENCOUNTER — Other Ambulatory Visit: Payer: Self-pay | Admitting: Family

## 2022-02-28 DIAGNOSIS — Z1231 Encounter for screening mammogram for malignant neoplasm of breast: Secondary | ICD-10-CM

## 2022-03-02 ENCOUNTER — Other Ambulatory Visit (HOSPITAL_COMMUNITY): Payer: Self-pay

## 2022-03-02 ENCOUNTER — Other Ambulatory Visit: Payer: Self-pay

## 2022-03-02 ENCOUNTER — Other Ambulatory Visit (HOSPITAL_BASED_OUTPATIENT_CLINIC_OR_DEPARTMENT_OTHER): Payer: Self-pay

## 2022-03-12 IMAGING — DX DG CHEST 1V PORT
1 series · 1 of 1 positions shown · non-contrast
Comparison: Chest x-ray dated 10/04/2020

CLINICAL DATA: Cough, shortness of breath

EXAM:
PORTABLE CHEST 1 VIEW

[chest ap]
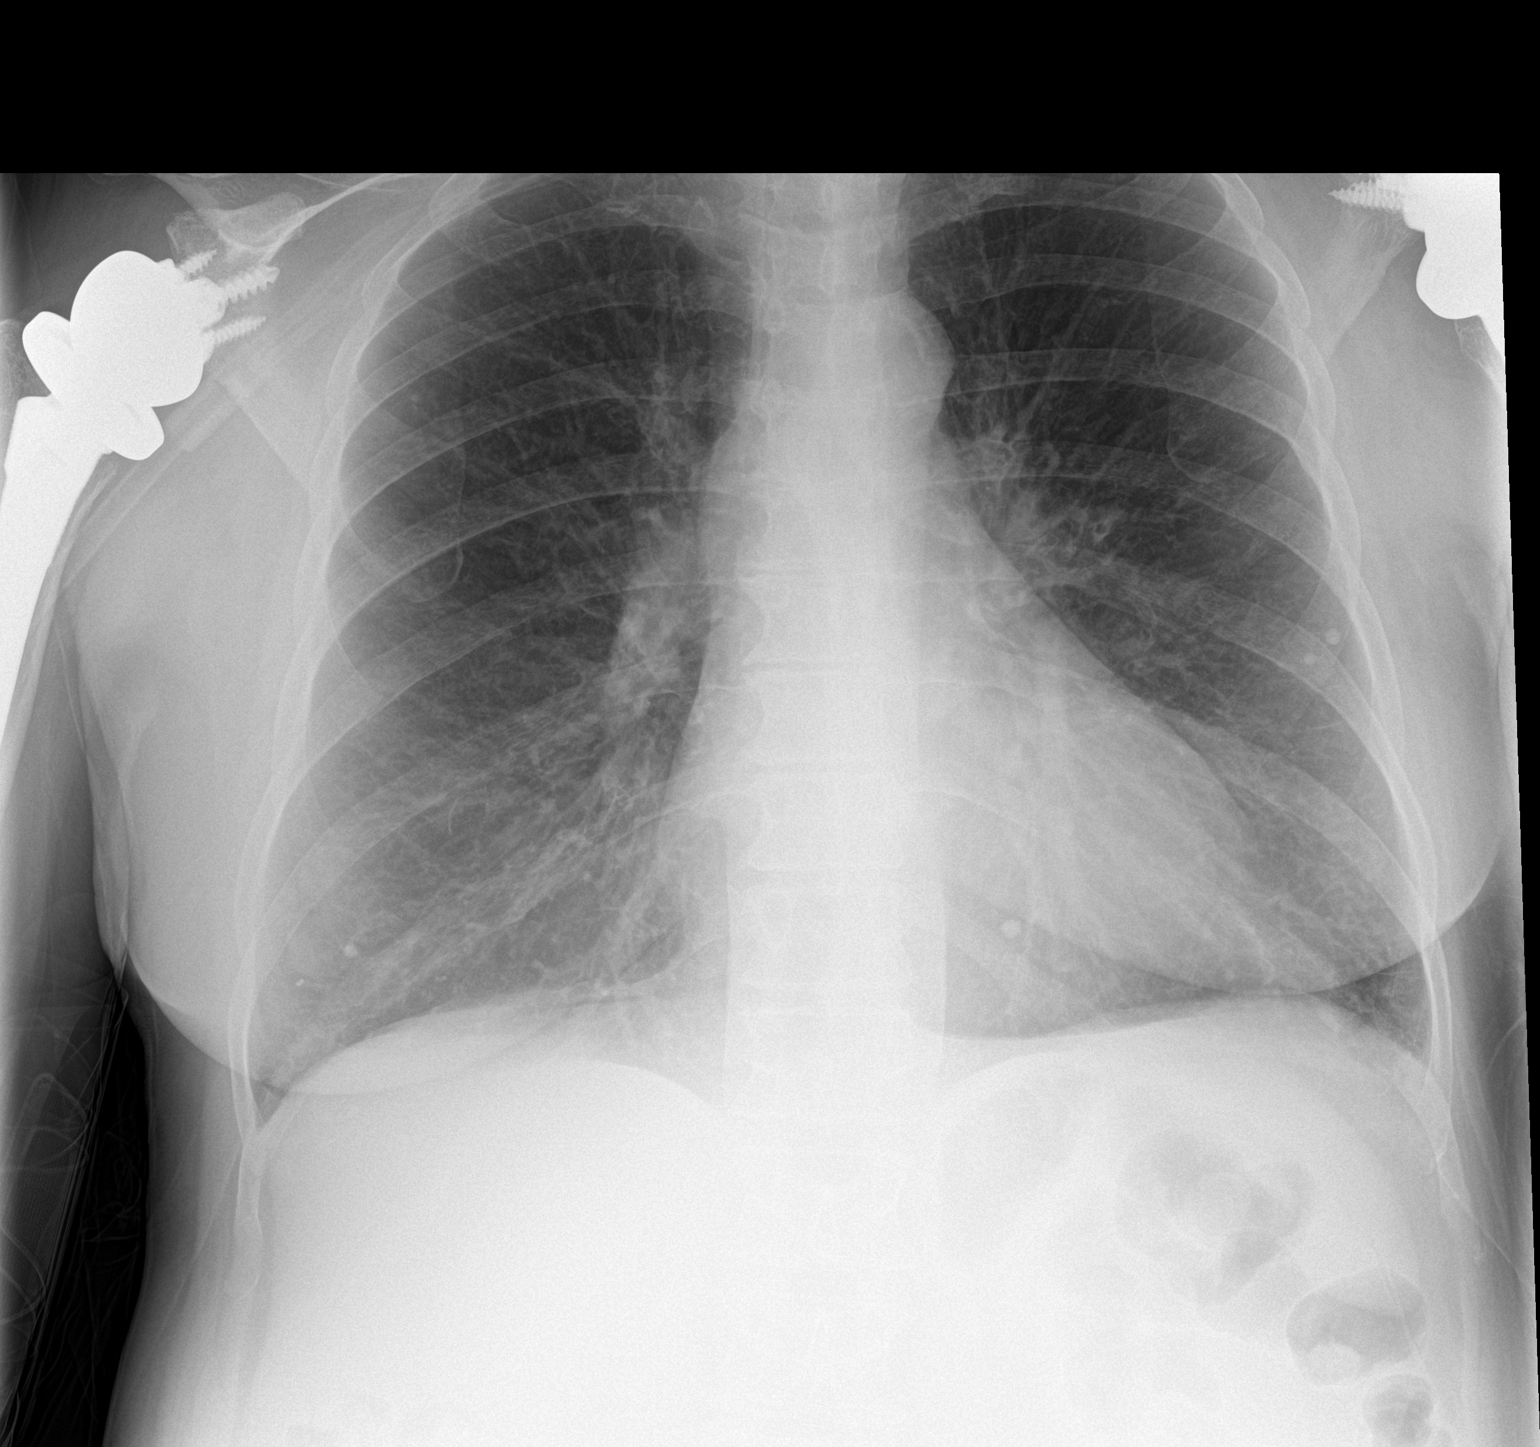

[1 of 1 positions shown; findings below may reference images not displayed]

FINDINGS: Heart size and mediastinal contours are within normal limits. Lungs
are clear. No pleural effusion or pneumothorax is seen.
IMPRESSION: No active disease. No evidence of pneumonia or pulmonary edema.

## 2022-03-21 ENCOUNTER — Encounter: Payer: Self-pay | Admitting: Gastroenterology

## 2022-03-23 DIAGNOSIS — M79671 Pain in right foot: Secondary | ICD-10-CM | POA: Diagnosis not present

## 2022-03-24 ENCOUNTER — Other Ambulatory Visit (HOSPITAL_COMMUNITY): Payer: Self-pay

## 2022-04-02 ENCOUNTER — Other Ambulatory Visit: Payer: Self-pay

## 2022-04-02 DIAGNOSIS — M79671 Pain in right foot: Secondary | ICD-10-CM | POA: Diagnosis not present

## 2022-04-03 ENCOUNTER — Other Ambulatory Visit: Payer: Self-pay | Admitting: Family Medicine

## 2022-04-06 ENCOUNTER — Encounter: Payer: Self-pay | Admitting: Family Medicine

## 2022-04-06 ENCOUNTER — Telehealth (INDEPENDENT_AMBULATORY_CARE_PROVIDER_SITE_OTHER): Payer: PPO | Admitting: Family Medicine

## 2022-04-06 VITALS — Ht 59.0 in | Wt 124.0 lb

## 2022-04-06 DIAGNOSIS — J441 Chronic obstructive pulmonary disease with (acute) exacerbation: Secondary | ICD-10-CM | POA: Diagnosis not present

## 2022-04-06 MED ORDER — DOXYCYCLINE HYCLATE 100 MG PO TABS: 100.0000 mg | ORAL_TABLET | Freq: Two times a day (BID) | ORAL | 0 refills | Status: DC

## 2022-04-06 MED ORDER — PREDNISONE 50 MG PO TABS: ORAL_TABLET | ORAL | 0 refills | Status: DC

## 2022-04-06 MED ORDER — HYDROCOD POLI-CHLORPHE POLI ER 10-8 MG/5ML PO SUER: 5.0000 mL | Freq: Every evening | ORAL | 0 refills | Status: DC | PRN

## 2022-04-06 NOTE — Progress Notes (Signed)
Current sx's: nasal congestion, productive cough, muscle aches from coughing, headache  Took COVID test = negative  Meds: Mucinex, Tylenol, RX'd cough syrup

## 2022-04-08 NOTE — Assessment & Plan Note (Signed)
And prednisone burst as well as course of doxycycline.  Prescription cough syrup for Tussionex sent in.  She may add Mucinex over-the-counter as well.  Instructed to contact clinic if having continued worsening of symptoms

## 2022-04-08 NOTE — Progress Notes (Signed)
Sherri Gibson - 62 y.o. female MRN [id number]  Date of birth: 10-04-1960   This visit type was conducted due to national recommendations for restrictions regarding the COVID-19 Pandemic (e.g. social distancing).  This format is felt to be most appropriate for this patient at this time.  All issues noted in this document were discussed and addressed.  No physical exam was performed (except for noted visual exam findings with Video Visits).  I discussed the limitations of evaluation and management by telemedicine and the availability of in person appointments. The patient expressed understanding and agreed to proceed.  I connected withNAME@ on 04/08/22 at 11:30 AM EST by a video enabled telemedicine application and verified that I am speaking with the correct person using two identifiers.  Present at visit: Luetta Nutting, DO [name]   Patient Location: [place] [address]   Provider location:   [facility]  Chief Complaint  Patient presents with   Cough    HPI  Sherri Gibson is a 62 y.o. female who presents via audio/video conferencing for a telehealth visit today.  He reports increased cough, wheezing, and chest congestion and mild shortness of breath.  This is similar to her previous COPD exacerbations.  She did test for COVID at home which was negative.  She has not had any fever or chills.  No GI symptoms.  She does continue on her home inhalers.  Continues on home O2 in the evenings.  She has not had increased oxygen requirement   ROS:  A comprehensive ROS was completed and negative except as noted per HPI  Past Medical History:  Diagnosis Date   Anemia    as a child   Arthritis    Both Shoulders   Breast cancer ([place]) [id number]   Left    Complication of anesthesia    states she had a hard time staying awake after hand surgery   COPD (chronic obstructive pulmonary disease) ([place])    uses O2 at 2l at night   High blood pressure     High cholesterol    History of kidney stones    Personal history of radiation therapy 2016   Left Breast Cancer   Pneumonia    as a child   Pre-diabetes    Prediabetes    Wears dentures    upper dentures    Past Surgical History:  Procedure Laterality Date   BIOPSY  07/11/2020   Procedure: BIOPSY;  Surgeon: Lavena Bullion, DO;  Location: [facility];  Service: Gastroenterology;;   BIOPSY  08/11/2020   Procedure: BIOPSY;  Surgeon: Milus Banister, MD;  Location: [facility];  Service: Endoscopy;;   BREAST BIOPSY     BREAST LUMPECTOMY Left 2021   BREAST LUMPECTOMY WITH AXILLARY LYMPH NODE BIOPSY  2016   COLONOSCOPY  2017   [facility]. think there was colon polyps   COLONOSCOPY WITH PROPOFOL N/A 07/11/2020   Procedure: COLONOSCOPY WITH PROPOFOL;  Surgeon: Lavena Bullion, DO;  Location: [facility];  Service: Gastroenterology;  Laterality: N/A;   ESOPHAGOGASTRODUODENOSCOPY (EGD) WITH PROPOFOL N/A 07/11/2020   Procedure: ESOPHAGOGASTRODUODENOSCOPY (EGD) WITH PROPOFOL;  Surgeon: Lavena Bullion, DO;  Location: [facility];  Service: Gastroenterology;  Laterality: N/A;   ESOPHAGOGASTRODUODENOSCOPY (EGD) WITH PROPOFOL N/A 08/11/2020   Procedure: ESOPHAGOGASTRODUODENOSCOPY (EGD) WITH PROPOFOL;  Surgeon: Milus Banister, MD;  Location: [facility];  Service: Endoscopy;  Laterality: N/A;   EUS N/A 08/11/2020  Procedure: UPPER ENDOSCOPIC ULTRASOUND (EUS) RADIAL;  Surgeon: Milus Banister, MD;  Location: [facility];  Service: Endoscopy;  Laterality: N/A;   HAND SURGERY Left    PARTIAL HYSTERECTOMY     SAVORY DILATION N/A 07/11/2020   Procedure: SAVORY DILATION;  Surgeon: Lavena Bullion, DO;  Location: [facility];  Service: Gastroenterology;  Laterality: N/A;   TOTAL SHOULDER ARTHROPLASTY Left 04/12/2020   Procedure: LEFT REVERSE TOTAL SHOULDER ARTHROPLASTY;  Surgeon: Meredith Pel, MD;  Location: [facility];  Service: Orthopedics;   Laterality: Left;   TOTAL SHOULDER ARTHROPLASTY Right 09/13/2020   Procedure: RIGHT TOTAL SHOULDER ARTHROPLASTY;  Surgeon: Meredith Pel, MD;  Location: [place];  Service: Orthopedics;  Laterality: Right;    Family History  Problem Relation Age of Onset   Breast cancer Mother    Lung cancer Father        smoker   Diabetes Brother    Colon cancer Neg Hx    Esophageal cancer Neg Hx     Social History   Socioeconomic History   Marital status: Widowed    Spouse name: Not on file   Number of children: 0   Years of education: Not on file   Highest education level: Not on file  Occupational History   Not on file  Tobacco Use   Smoking status: Former    Packs/day: 2.50    Years: 44.00    Total pack years: 110.00    Types: Cigarettes    Quit date: 11/29/2016    Years since quitting: 5.3   Smokeless tobacco: Never  Vaping Use   Vaping Use: Never used  Substance and Sexual Activity   Alcohol use: Not Currently   Drug use: Never   Sexual activity: Yes    Partners: Male    Birth control/protection: None  Other Topics Concern   Not on file  Social History Narrative   Not on file   Social Determinants of Health   Financial Resource Strain: Not on file  Food Insecurity: Not on file  Transportation Needs: Not on file  Physical Activity: Not on file  Stress: Not on file  Social Connections: Not on file  Intimate Partner Violence: Not on file     Current Outpatient Medications:    albuterol (PROVENTIL) (2.5 MG/3ML) 0.083% nebulizer solution, Take 3 mLs (2.5 mg total) by nebulization every 6 (six) hours as needed for wheezing or shortness of breath., Disp: 99 mL, Rfl: 1   albuterol (VENTOLIN HFA) 108 (90 Base) MCG/ACT inhaler, Inhale 2 puffs into the lungs every 4 (four) hours as needed for wheezing or shortness of breath., Disp: 6.7 g, Rfl: 4   amLODipine (NORVASC) 10 MG tablet, Take 1 tablet (10 mg total) by mouth daily., Disp: 90 tablet, Rfl: 1   aspirin EC 81 MG  tablet, Take 81 mg by mouth every evening. Swallow whole., Disp: , Rfl:    atorvastatin (LIPITOR) 40 MG tablet, Take 1 tablet (40 mg total) by mouth every evening., Disp: 90 tablet, Rfl: 1   Calcium Carb-Cholecalciferol (CALCIUM 600-D PO), Take 1 tablet by mouth in the morning and at bedtime., Disp: , Rfl:    chlorpheniramine-HYDROcodone (TUSSIONEX) 10-8 MG/5ML, Take 5 mLs by mouth at bedtime as needed for cough., Disp: 100 mL, Rfl: 0   cyclobenzaprine (FLEXERIL) 10 MG tablet, Take 10 mg by mouth 3 (three) times daily., Disp: , Rfl:    doxycycline (VIBRA-TABS) 100 MG tablet, Take 1 tablet (100 mg total) by mouth 2 (  two) times daily., Disp: 20 tablet, Rfl: 0   fluticasone-salmeterol (WIXELA INHUB) 250-50 MCG/ACT AEPB, Inhale 1 puff into the lungs in the morning and at bedtime., Disp: 180 each, Rfl: 2   loratadine (CLARITIN) 10 MG tablet, TAKE 1 TABLET BY MOUTH EVERY DAY, Disp: 90 tablet, Rfl: 1   losartan (COZAAR) 50 MG tablet, Take 1 tablet (50 mg total) by mouth daily., Disp: 90 tablet, Rfl: 1   metFORMIN (GLUCOPHAGE) 500 MG tablet, Take 2 tablets (1,000 mg total) by mouth daily., Disp: 180 tablet, Rfl: 1   montelukast (SINGULAIR) 10 MG tablet, Take 1 tablet (10 mg total) by mouth at bedtime., Disp: 90 tablet, Rfl: 3   Multiple Vitamins-Minerals (MULTIVITAMIN WITH MINERALS) tablet, Take 1 tablet by mouth daily., Disp: , Rfl:    OXYGEN, Inhale 2 L into the lungs at bedtime., Disp: , Rfl:    pantoprazole (PROTONIX) 40 MG tablet, Take 1 tablet (40 mg total) by mouth 2 (two) times daily., Disp: 180 tablet, Rfl: 5   predniSONE (DELTASONE) 50 MG tablet, Take '50mg'$  daily x5 days., Disp: 5 tablet, Rfl: 0   Respiratory Therapy Supplies (NEBULIZER AIR TUBE/PLUGS) MISC, As directed w/ nebulizer for Dx COPD, Disp: 1 each, Rfl: 99   Respiratory Therapy Supplies (NEBULIZER) DEVI, As directed for Dx COPD, Disp: 1 each, Rfl: 99   sodium chloride (OCEAN) 0.65 % SOLN nasal spray, Place 1 spray into both nostrils  daily as needed (Dry nose)., Disp: , Rfl:    Suvorexant (BELSOMRA) 20 MG TABS, Take 1 tablet by mouth at bedtime as needed., Disp: 30 tablet, Rfl: 4   Tiotropium Bromide Monohydrate (SPIRIVA RESPIMAT) 2.5 MCG/ACT AERS, Inhale 2 puffs into the lungs daily., Disp: 12 g, Rfl: 2  EXAM:  VITALS per patient if applicable: Ht '4\' 11"'$  (1.499 m)   Wt 124 lb (56.2 kg)   BMI 25.04 kg/m   GENERAL: alert, oriented, appears well and in no acute distress  HEENT: atraumatic, conjunttiva clear, no obvious abnormalities on inspection of external nose and ears  NECK: normal movements of the head and neck  LUNGS: on inspection no signs of respiratory distress, breathing rate appears normal, no obvious gross SOB, gasping or wheezing  CV: no obvious cyanosis  MS: moves all visible extremities without noticeable abnormality  PSYCH/NEURO: pleasant and cooperative, no obvious depression or anxiety, speech and thought processing grossly intact  ASSESSMENT AND PLAN:  Discussed the following assessment and plan:  COPD with exacerbation (HCC) And prednisone burst as well as course of doxycycline.  Prescription cough syrup for Tussionex sent in.  She may add Mucinex over-the-counter as well.  Instructed to contact clinic if having continued worsening of symptoms     I discussed the assessment and treatment plan with the patient. The patient was provided an opportunity to ask questions and all were answered. The patient agreed with the plan and demonstrated an understanding of the instructions.   The patient was advised to call back or seek an in-person evaluation if the symptoms worsen or if the condition fails to improve as anticipated.    Luetta Nutting, DO

## 2022-04-11 ENCOUNTER — Other Ambulatory Visit (HOSPITAL_COMMUNITY): Payer: Self-pay

## 2022-04-12 ENCOUNTER — Other Ambulatory Visit (HOSPITAL_COMMUNITY): Payer: Self-pay

## 2022-04-18 ENCOUNTER — Ambulatory Visit (INDEPENDENT_AMBULATORY_CARE_PROVIDER_SITE_OTHER): Payer: PPO

## 2022-04-18 DIAGNOSIS — Z1231 Encounter for screening mammogram for malignant neoplasm of breast: Secondary | ICD-10-CM | POA: Diagnosis not present

## 2022-04-23 DIAGNOSIS — M79671 Pain in right foot: Secondary | ICD-10-CM | POA: Diagnosis not present

## 2022-04-24 ENCOUNTER — Other Ambulatory Visit (HOSPITAL_COMMUNITY): Payer: Self-pay

## 2022-04-26 ENCOUNTER — Other Ambulatory Visit (HOSPITAL_COMMUNITY): Payer: Self-pay

## 2022-04-26 ENCOUNTER — Ambulatory Visit (INDEPENDENT_AMBULATORY_CARE_PROVIDER_SITE_OTHER): Payer: PPO | Admitting: Family Medicine

## 2022-04-26 ENCOUNTER — Encounter: Payer: Self-pay | Admitting: Family Medicine

## 2022-04-26 ENCOUNTER — Ambulatory Visit (INDEPENDENT_AMBULATORY_CARE_PROVIDER_SITE_OTHER): Payer: PPO

## 2022-04-26 ENCOUNTER — Other Ambulatory Visit: Payer: Self-pay | Admitting: Family Medicine

## 2022-04-26 VITALS — BP 99/62 | HR 106 | Temp 98.1°F | Ht 59.0 in | Wt 124.0 lb

## 2022-04-26 DIAGNOSIS — J449 Chronic obstructive pulmonary disease, unspecified: Secondary | ICD-10-CM

## 2022-04-26 DIAGNOSIS — R059 Cough, unspecified: Secondary | ICD-10-CM | POA: Diagnosis not present

## 2022-04-26 DIAGNOSIS — J441 Chronic obstructive pulmonary disease with (acute) exacerbation: Secondary | ICD-10-CM

## 2022-04-26 MED ORDER — AMOXICILLIN-POT CLAVULANATE 875-125 MG PO TABS: 1.0000 | ORAL_TABLET | Freq: Two times a day (BID) | ORAL | 0 refills | Status: DC

## 2022-04-26 MED ORDER — PREDNISONE 5 MG (48) PO TBPK: ORAL_TABLET | ORAL | 0 refills | Status: DC

## 2022-04-26 MED ORDER — BENZONATATE 200 MG PO CAPS: 200.0000 mg | ORAL_CAPSULE | Freq: Two times a day (BID) | ORAL | 0 refills | Status: DC | PRN

## 2022-04-26 MED ORDER — ALBUTEROL SULFATE HFA 108 (90 BASE) MCG/ACT IN AERS: 2.0000 | INHALATION_SPRAY | RESPIRATORY_TRACT | 4 refills | Status: DC | PRN

## 2022-04-26 MED ORDER — ALBUTEROL SULFATE (2.5 MG/3ML) 0.083% IN NEBU: 2.5000 mg | INHALATION_SOLUTION | Freq: Four times a day (QID) | RESPIRATORY_TRACT | 1 refills | Status: DC | PRN

## 2022-04-26 NOTE — Patient Instructions (Addendum)
Start augmentin Start prednisone taper. Tessalon perles twice daily as needed for cough.  We'll be in touch with xray results.

## 2022-04-26 NOTE — Progress Notes (Signed)
Sherri Gibson - 62 y.o. female MRN [id number]  Date of birth: November 18, 1960  Subjective Chief Complaint  Patient presents with   Cough   URI    HPI Sherri Gibson is a 62 year old female here today with complaint of cough, chest congestion, dyspnea and wheezing.  Seen approximately 3 weeks ago via virtual visit for similar symptoms.  Treated with doxycycline and prednisone.  She reports that symptoms improved but returned again 1 week ago.  He is having increased sputum production.  Denies fever or chills.  She is using inhalers as directed.  She has needed to use her oxygen more frequently recently.  ROS:  A comprehensive ROS was completed and negative except as noted per HPI  Allergies  Allergen Reactions   Codeine Itching    Feels like something crawling on her skin    Tetanus Toxoids Swelling    Patient stated,"I was given a Pneumonia shot at the same time I got the Tetanus shot, in the same arm."    Past Medical History:  Diagnosis Date   Anemia    as a child   Arthritis    Both Shoulders   Breast cancer ([place]) [id number]   Left    Complication of anesthesia    states she had a hard time staying awake after hand surgery   COPD (chronic obstructive pulmonary disease) ([place])    uses O2 at 2l at night   High blood pressure    High cholesterol    History of kidney stones    Personal history of radiation therapy 2016   Left Breast Cancer   Pneumonia    as a child   Pre-diabetes    Prediabetes    Wears dentures    upper dentures    Past Surgical History:  Procedure Laterality Date   BIOPSY  07/11/2020   Procedure: BIOPSY;  Surgeon: Lavena Bullion, DO;  [facility]: [facility];  Service: Gastroenterology;;   BIOPSY  08/11/2020   Procedure: BIOPSY;  Surgeon: Milus Banister, MD;  [facility]: [facility];  Service: Endoscopy;;   BREAST BIOPSY     BREAST LUMPECTOMY Left 2021   BREAST LUMPECTOMY WITH AXILLARY LYMPH NODE BIOPSY  2016   COLONOSCOPY  2017   [facility] [place] [facility]. think there was colon polyps   COLONOSCOPY WITH PROPOFOL N/A 07/11/2020   Procedure: COLONOSCOPY WITH PROPOFOL;  Surgeon: Lavena Bullion, DO;  [facility];  Service: Gastroenterology;  Laterality: N/A;   ESOPHAGOGASTRODUODENOSCOPY (EGD) WITH PROPOFOL N/A 07/11/2020   Procedure: ESOPHAGOGASTRODUODENOSCOPY (EGD) WITH PROPOFOL;  Surgeon: Lavena Bullion, DO;  [facility];  Service: Gastroenterology;  Laterality: N/A;   ESOPHAGOGASTRODUODENOSCOPY (EGD) WITH PROPOFOL N/A 08/11/2020   Procedure: ESOPHAGOGASTRODUODENOSCOPY (EGD) WITH PROPOFOL;  Surgeon: Milus Banister, MD;  [facility];  Service: Endoscopy;  Laterality: N/A;   EUS N/A 08/11/2020   Procedure: UPPER ENDOSCOPIC ULTRASOUND (EUS) RADIAL;  Surgeon: Milus Banister, MD;  [facility];  Service: Endoscopy;  Laterality: N/A;   HAND SURGERY Left    PARTIAL HYSTERECTOMY     SAVORY DILATION N/A 07/11/2020   Procedure: SAVORY DILATION;  Surgeon: Lavena Bullion, DO;  [facility];  Service: Gastroenterology;  Laterality: N/A;   TOTAL SHOULDER ARTHROPLASTY Left 04/12/2020   Procedure: LEFT REVERSE TOTAL SHOULDER ARTHROPLASTY;  Surgeon: Meredith Pel, MD;  Location: [place];  Service: Orthopedics;  Laterality: Left;   TOTAL SHOULDER ARTHROPLASTY Right 09/13/2020   Procedure: RIGHT  TOTAL SHOULDER ARTHROPLASTY;  Surgeon: Meredith Pel, MD;  Location: [place];  Service: Orthopedics;  Laterality: Right;    Social History   Socioeconomic History   Marital status: Widowed    Spouse name: Not on file   Number of children: 0   Years of education: Not on file   Highest education level: GED or equivalent  Occupational History   Not on file  Tobacco Use   Smoking status: Former    Packs/day: 2.50    Years: 44.00    Additional pack years: 0.00    Total pack years: 110.00    Types: Cigarettes    Quit date: 11/29/2016    Years since  quitting: 5.4   Smokeless tobacco: Never  Vaping Use   Vaping Use: Never used  Substance and Sexual Activity   Alcohol use: Not Currently   Drug use: Never   Sexual activity: Yes    Partners: Male    Birth control/protection: None  Other Topics Concern   Not on file  Social History Narrative   Not on file   Social Determinants of Health   Financial Resource Strain: Low Risk  (04/25/2022)   Overall Financial Resource Strain (CARDIA)    Difficulty of Paying Living Expenses: Not hard at all  Food Insecurity: No Food Insecurity (04/25/2022)   Hunger Vital Sign    Worried About Running Out of Food in the Last Year: Never true    [place] in the Last Year: Never true  Transportation Needs: No Transportation Needs (04/25/2022)   PRAPARE - Hydrologist (Medical): No    Lack of Transportation (Non-Medical): No  Physical Activity: Unknown (04/25/2022)   Exercise Vital Sign    Days of Exercise per Week: 0 days    Minutes of Exercise per Session: Not on file  Stress: Stress Concern Present (04/25/2022)   [place]    Feeling of Stress : Very much  Social Connections: Socially Isolated (04/25/2022)   Social Connection and Isolation Panel [NHANES]    Frequency of Communication with Friends and Family: More than three times a week    Frequency of Social Gatherings with Friends and Family: Once a week    Attends Religious Services: Never    Marine scientist or Organizations: No    Attends Music therapist: Not on file    Marital Status: Widowed    Family History  Problem Relation Age of Onset   Breast cancer Mother    Lung cancer Father        smoker   Diabetes Brother    Colon cancer Neg Hx    Esophageal cancer Neg Hx     Health Maintenance  Topic Date Due   Medicare Annual Wellness (AWV)  Never done   Hepatitis C Screening  Never done   OPHTHALMOLOGY EXAM   05/03/2022 (Originally 12/19/2021)   COVID-19 Vaccine (5 - 2023-24 season) 02/02/2023 (Originally 12/09/2021)   Lung Cancer Screening  06/21/2022   HEMOGLOBIN A1C  08/02/2022   Diabetic kidney evaluation - eGFR measurement  02/02/2023   Diabetic kidney evaluation - Urine ACR  02/02/2023   FOOT EXAM  02/02/2023   MAMMOGRAM  04/17/2024   DTaP/Tdap/Td (2 - Td or Tdap) 05/30/2027   COLONOSCOPY (Pts 45-25yrs Insurance coverage will need to be confirmed)  07/12/2030   INFLUENZA VACCINE  Completed   HIV Screening  Completed   Zoster  Vaccines- Shingrix  Completed   HPV VACCINES  Aged Out   PAP SMEAR-Modifier  Discontinued     ----------------------------------------------------------------------------------------------------------------------------------------------------------------------------------------------------------------- Physical Exam BP 99/62 (BP Location: Right Arm, Patient Position: Sitting, Cuff Size: Small)   Pulse (!) 106   Temp 98.1 F (36.7 C) (Oral)   Ht 4\' 11"  (1.499 m)   Wt 124 lb (56.2 kg)   SpO2 97%   BMI 25.04 kg/m   Physical Exam Constitutional:      Appearance: Normal appearance.  HENT:     Head: Normocephalic and atraumatic.  Eyes:     General: No scleral icterus. Cardiovascular:     Rate and Rhythm: Normal rate and regular rhythm.  Pulmonary:     Effort: Pulmonary effort is normal.     Breath sounds: Wheezing present.  Neurological:     Mental Status: She is alert.  Psychiatric:        Mood and Affect: Mood normal.        Behavior: Behavior normal.     ------------------------------------------------------------------------------------------------------------------------------------------------------------------------------------------------------------------- Assessment and Plan  COPD with exacerbation ([place]) Brief improvement of her symptoms with steroid burst previously, changing to a longer taper.  Recently completed doxycycline a few  weeks ago without much improvement, will treat with Augmentin at this time.  Continue current inhalers.  Chest x-ray ordered.   Meds ordered this encounter  Medications   predniSONE (STERAPRED UNI-PAK 48 TAB) 5 MG (48) TBPK tablet    Sig: Taper as directed on packaging.    Dispense:  48 tablet    Refill:  0   amoxicillin-clavulanate (AUGMENTIN) 875-125 MG tablet    Sig: Take 1 tablet by mouth 2 (two) times daily.    Dispense:  20 tablet    Refill:  0   benzonatate (TESSALON) 200 MG capsule    Sig: Take 1 capsule (200 mg total) by mouth 2 (two) times daily as needed for cough.    Dispense:  30 capsule    Refill:  0   albuterol (PROVENTIL) (2.5 MG/3ML) 0.083% nebulizer solution    Sig: Take 3 mLs (2.5 mg total) by nebulization every 6 (six) hours as needed for wheezing or shortness of breath.    Dispense:  99 mL    Refill:  1   albuterol (VENTOLIN HFA) 108 (90 Base) MCG/ACT inhaler    Sig: Inhale 2 puffs into the lungs every 4 (four) hours as needed for wheezing or shortness of breath.    Dispense:  6.7 g    Refill:  4    No follow-ups on file.    This visit occurred during the SARS-CoV-2 public health emergency.  Safety protocols were in place, including screening questions prior to the visit, additional usage of staff PPE, and extensive cleaning of exam room while observing appropriate contact time as indicated for disinfecting solutions.

## 2022-04-26 NOTE — Assessment & Plan Note (Signed)
Brief improvement of her symptoms with steroid burst previously, changing to a longer taper.  Recently completed doxycycline a few weeks ago without much improvement, will treat with Augmentin at this time.  Continue current inhalers.  Chest x-ray ordered.

## 2022-04-30 ENCOUNTER — Other Ambulatory Visit: Payer: Self-pay

## 2022-05-08 ENCOUNTER — Encounter: Payer: Self-pay | Admitting: Family Medicine

## 2022-05-08 NOTE — Telephone Encounter (Signed)
Ok to provide letter for deferment to a later date but not permanent excuse.   CM

## 2022-05-09 ENCOUNTER — Other Ambulatory Visit: Payer: Self-pay

## 2022-05-09 DIAGNOSIS — K3189 Other diseases of stomach and duodenum: Secondary | ICD-10-CM

## 2022-05-09 NOTE — Telephone Encounter (Signed)
EUS has been scheduled for 06/14/22 at 12 noon at [facility] with GM   Left message on machine to call back

## 2022-05-10 NOTE — Telephone Encounter (Signed)
EUS scheduled, pt instructed and medications reviewed.  Patient instructions mailed to home.  Patient to call with any questions or concerns.  

## 2022-06-01 ENCOUNTER — Other Ambulatory Visit (HOSPITAL_COMMUNITY): Payer: Self-pay

## 2022-06-01 ENCOUNTER — Other Ambulatory Visit: Payer: Self-pay

## 2022-06-04 ENCOUNTER — Other Ambulatory Visit: Payer: Self-pay

## 2022-06-07 ENCOUNTER — Encounter (HOSPITAL_COMMUNITY): Payer: Self-pay | Admitting: Gastroenterology

## 2022-06-14 ENCOUNTER — Ambulatory Visit (HOSPITAL_COMMUNITY): Payer: PPO | Admitting: Anesthesiology

## 2022-06-14 ENCOUNTER — Encounter (HOSPITAL_COMMUNITY): Payer: Self-pay | Admitting: Gastroenterology

## 2022-06-14 ENCOUNTER — Ambulatory Visit (HOSPITAL_COMMUNITY)
Admission: RE | Admit: 2022-06-14 | Discharge: 2022-06-14 | Disposition: A | Discharge status: Home / Self Care | Payer: PPO | Attending: Gastroenterology | Admitting: Gastroenterology

## 2022-06-14 ENCOUNTER — Encounter (HOSPITAL_COMMUNITY)
Admission: RE | Disposition: A | Discharge status: Home / Self Care | Payer: Self-pay | Source: Home / Self Care | Attending: Gastroenterology

## 2022-06-14 ENCOUNTER — Other Ambulatory Visit: Payer: Self-pay

## 2022-06-14 ENCOUNTER — Ambulatory Visit (HOSPITAL_BASED_OUTPATIENT_CLINIC_OR_DEPARTMENT_OTHER): Payer: PPO | Admitting: Anesthesiology

## 2022-06-14 DIAGNOSIS — Z87891 Personal history of nicotine dependence: Secondary | ICD-10-CM

## 2022-06-14 DIAGNOSIS — Z79899 Other long term (current) drug therapy: Secondary | ICD-10-CM | POA: Insufficient documentation

## 2022-06-14 DIAGNOSIS — K2289 Other specified disease of esophagus: Secondary | ICD-10-CM | POA: Insufficient documentation

## 2022-06-14 DIAGNOSIS — C49A2 Gastrointestinal stromal tumor of stomach: Secondary | ICD-10-CM

## 2022-06-14 DIAGNOSIS — I10 Essential (primary) hypertension: Secondary | ICD-10-CM | POA: Insufficient documentation

## 2022-06-14 DIAGNOSIS — K449 Diaphragmatic hernia without obstruction or gangrene: Secondary | ICD-10-CM | POA: Diagnosis not present

## 2022-06-14 DIAGNOSIS — Z9981 Dependence on supplemental oxygen: Secondary | ICD-10-CM | POA: Diagnosis not present

## 2022-06-14 DIAGNOSIS — K219 Gastro-esophageal reflux disease without esophagitis: Secondary | ICD-10-CM | POA: Diagnosis not present

## 2022-06-14 DIAGNOSIS — E7800 Pure hypercholesterolemia, unspecified: Secondary | ICD-10-CM | POA: Insufficient documentation

## 2022-06-14 DIAGNOSIS — K317 Polyp of stomach and duodenum: Secondary | ICD-10-CM

## 2022-06-14 DIAGNOSIS — K3189 Other diseases of stomach and duodenum: Secondary | ICD-10-CM | POA: Diagnosis not present

## 2022-06-14 DIAGNOSIS — N2889 Other specified disorders of kidney and ureter: Secondary | ICD-10-CM | POA: Diagnosis not present

## 2022-06-14 DIAGNOSIS — Z853 Personal history of malignant neoplasm of breast: Secondary | ICD-10-CM | POA: Diagnosis not present

## 2022-06-14 DIAGNOSIS — Z923 Personal history of irradiation: Secondary | ICD-10-CM | POA: Insufficient documentation

## 2022-06-14 DIAGNOSIS — K2980 Duodenitis without bleeding: Secondary | ICD-10-CM | POA: Diagnosis not present

## 2022-06-14 DIAGNOSIS — E119 Type 2 diabetes mellitus without complications: Secondary | ICD-10-CM | POA: Insufficient documentation

## 2022-06-14 DIAGNOSIS — J449 Chronic obstructive pulmonary disease, unspecified: Secondary | ICD-10-CM | POA: Insufficient documentation

## 2022-06-14 DIAGNOSIS — M1990 Unspecified osteoarthritis, unspecified site: Secondary | ICD-10-CM | POA: Insufficient documentation

## 2022-06-14 DIAGNOSIS — Z7984 Long term (current) use of oral hypoglycemic drugs: Secondary | ICD-10-CM | POA: Diagnosis not present

## 2022-06-14 DIAGNOSIS — N281 Cyst of kidney, acquired: Secondary | ICD-10-CM | POA: Diagnosis not present

## 2022-06-14 HISTORY — PX: POLYPECTOMY: SHX5525

## 2022-06-14 HISTORY — PX: EUS: SHX5427

## 2022-06-14 HISTORY — PX: ESOPHAGOGASTRODUODENOSCOPY: SHX5428

## 2022-06-14 LAB — GLUCOSE, CAPILLARY: Glucose-Capillary: 97 mg/dL (ref 70–99)

## 2022-06-14 SURGERY — UPPER ENDOSCOPIC ULTRASOUND (EUS) RADIAL
Anesthesia: Monitor Anesthesia Care

## 2022-06-14 MED ORDER — LIDOCAINE 2% (20 MG/ML) 5 ML SYRINGE
INTRAMUSCULAR | Status: DC | PRN
  Administered 2022-06-14: 60 mg via INTRAVENOUS

## 2022-06-14 MED ORDER — LACTATED RINGERS IV SOLN
INTRAVENOUS | Status: DC
  Administered 2022-06-14: 1000 mL via INTRAVENOUS

## 2022-06-14 MED ORDER — PROPOFOL 500 MG/50ML IV EMUL
INTRAVENOUS | Status: DC | PRN
  Administered 2022-06-14: 150 ug/kg/min via INTRAVENOUS

## 2022-06-14 MED ORDER — SODIUM CHLORIDE 0.9 % IV SOLN: INTRAVENOUS | Status: DC

## 2022-06-14 MED ORDER — PROPOFOL 10 MG/ML IV BOLUS
INTRAVENOUS | Status: DC | PRN
  Administered 2022-06-14 (×3): 20 mg via INTRAVENOUS

## 2022-06-14 NOTE — Anesthesia Preprocedure Evaluation (Signed)
Anesthesia Evaluation  Patient identified by MRN, date of birth, ID band Patient awake    Reviewed: Allergy & Precautions, NPO status , Patient's Chart, lab work & pertinent test results  Airway Mallampati: II  TM Distance: >3 FB Neck ROM: Full    Dental  (+) Upper Dentures   Pulmonary COPD (2L QHS),  oxygen dependent, former smoker   Pulmonary exam normal        Cardiovascular hypertension, Pt. on medications  Rhythm:Regular Rate:Normal     Neuro/Psych negative neurological ROS  negative psych ROS   GI/Hepatic Neg liver ROS,GERD  Medicated,,Gastric nodule   Endo/Other  diabetes, Type 2, Oral Hypoglycemic Agents    Renal/GU   negative genitourinary   Musculoskeletal  (+) Arthritis , Osteoarthritis,    Abdominal Normal abdominal exam  (+)   Peds  Hematology  (+) Blood dyscrasia, anemia   Anesthesia Other Findings   Reproductive/Obstetrics                             Anesthesia Physical Anesthesia Plan  ASA: 3  Anesthesia Plan: MAC   Post-op Pain Management:    Induction: Intravenous  PONV Risk Score and Plan: 2 and Propofol infusion and Treatment may vary due to age or medical condition  Airway Management Planned: Simple Face Mask and Nasal Cannula  Additional Equipment: None  Intra-op Plan:   Post-operative Plan:   Informed Consent: I have reviewed the patients History and Physical, chart, labs and discussed the procedure including the risks, benefits and alternatives for the proposed anesthesia with the patient or authorized representative who has indicated his/her understanding and acceptance.     Dental advisory given  Plan Discussed with: CRNA  Anesthesia Plan Comments:        Anesthesia Quick Evaluation

## 2022-06-14 NOTE — Transfer of Care (Signed)
Immediate Anesthesia Transfer of Care Note  Patient: Sherri Gibson  Procedure(s) Performed: UPPER ENDOSCOPIC ULTRASOUND (EUS) RADIAL ESOPHAGOGASTRODUODENOSCOPY (EGD) POLYPECTOMY  Patient Location: Endoscopy Unit  Anesthesia Type:MAC  Level of Consciousness: drowsy and patient cooperative  Airway & Oxygen Therapy: Patient Spontanous Breathing and Patient connected to face mask oxygen  Post-op Assessment: Report given to RN and Post -op Vital signs reviewed and stable  Post vital signs: Reviewed and stable  Last Vitals:  Vitals Value Taken Time  BP 114/72   Temp    Pulse 89   Resp 14   SpO2 97%     Last Pain:  Vitals:   06/14/22 0710  TempSrc: Temporal         Complications: No notable events documented.

## 2022-06-14 NOTE — H&P (Signed)
GASTROENTEROLOGY PROCEDURE H&P NOTE   Primary Care Physician: Everrett Coombe, DO  HPI: Sherri Gibson is a 62 y.o. 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  Past Medical History:  Diagnosis Date   Anemia    as a child   Arthritis    Both Shoulders   Breast cancer ([facility]) 2016   Left    Complication of anesthesia    states she had a hard time staying awake after hand surgery   COPD (chronic obstructive pulmonary disease) ([facility])    uses O2 at 2l at night   High blood pressure    High cholesterol    History of kidney stones    Personal history of radiation therapy 2016   Left Breast Cancer   Pneumonia    as a child   Pre-diabetes    Prediabetes    Wears dentures    upper dentures   Past Surgical History:  Procedure Laterality Date   BIOPSY  07/11/2020   Procedure: BIOPSY;  Surgeon: Shellia Cleverly, DO;  [facility];  Service: Gastroenterology;;   BIOPSY  08/11/2020   Procedure: BIOPSY;  Surgeon: Rachael Fee, MD;  [facility];  Service: Endoscopy;;   BREAST BIOPSY     BREAST LUMPECTOMY Left 2021   BREAST LUMPECTOMY WITH AXILLARY LYMPH NODE BIOPSY  2016   COLONOSCOPY  2017   [facility]. think there was colon polyps   COLONOSCOPY WITH PROPOFOL N/A 07/11/2020   Procedure: COLONOSCOPY WITH PROPOFOL;  Surgeon: Shellia Cleverly, DO;  [facility];  Service: Gastroenterology;  Laterality: N/A;   ESOPHAGOGASTRODUODENOSCOPY (EGD) WITH PROPOFOL N/A 07/11/2020   Procedure: ESOPHAGOGASTRODUODENOSCOPY (EGD) WITH PROPOFOL;  Surgeon: Shellia Cleverly, DO;  [facility];  Service: Gastroenterology;  Laterality: N/A;   ESOPHAGOGASTRODUODENOSCOPY (EGD) WITH PROPOFOL N/A 08/11/2020   Procedure: ESOPHAGOGASTRODUODENOSCOPY (EGD) WITH PROPOFOL;  Surgeon: Rachael Fee, MD;  [facility];  Service: Endoscopy;  Laterality: N/A;   EUS  N/A 08/11/2020   Procedure: UPPER ENDOSCOPIC ULTRASOUND (EUS) RADIAL;  Surgeon: Rachael Fee, MD;  [facility];  Service: Endoscopy;  Laterality: N/A;   HAND SURGERY Left    PARTIAL HYSTERECTOMY     SAVORY DILATION N/A 07/11/2020   Procedure: SAVORY DILATION;  Surgeon: Shellia Cleverly, DO;  [facility];  Service: Gastroenterology;  Laterality: N/A;   TOTAL SHOULDER ARTHROPLASTY Left 04/12/2020   Procedure: LEFT REVERSE TOTAL SHOULDER ARTHROPLASTY;  Surgeon: Cammy Copa, MD;  Location: [facility] OR;  Service: Orthopedics;  Laterality: Left;   TOTAL SHOULDER ARTHROPLASTY Right 09/13/2020   Procedure: RIGHT TOTAL SHOULDER ARTHROPLASTY;  Surgeon: Cammy Copa, MD;  Location: [facility] OR;  Service: Orthopedics;  Laterality: Right;   Current Facility-Administered Medications  Medication Dose Route Frequency Provider Last Rate Last Admin   0.9 %  sodium chloride infusion   Intravenous Continuous Mansouraty, Netty Starring., MD       lactated ringers infusion   Intravenous Continuous Mansouraty, Netty Starring., MD 10 mL/hr at 06/14/22 0738 1,000 mL at 06/14/22 1610    Current Facility-Administered Medications:    0.9 %  sodium chloride infusion, , Intravenous, Continuous, Mansouraty, Netty Starring., MD   lactated ringers infusion, , Intravenous, Continuous, Mansouraty, Netty Starring., MD, Last Rate: 10 mL/hr at 06/14/22 0738, 1,000 mL at 06/14/22 0738 Allergies  Allergen Reactions   Codeine Itching    Feels like something  crawling on her skin    Tetanus Toxoids Swelling    Patient stated,"I was given a Pneumonia shot at the same time I got the Tetanus shot, in the same arm."   Family History  Problem Relation Age of Onset   Breast cancer Mother    Lung cancer Father        smoker   Diabetes Brother    Colon cancer Neg Hx    Esophageal cancer Neg Hx    Social History   Socioeconomic History   Marital status: Widowed    Spouse name: Not on file   Number of children: 0    Years of education: Not on file   Highest education level: GED or equivalent  Occupational History   Not on file  Tobacco Use   Smoking status: Former    Packs/day: 2.50    Years: 44.00    Additional pack years: 0.00    Total pack years: 110.00    Types: Cigarettes    Quit date: 11/29/2016    Years since quitting: 5.5   Smokeless tobacco: Never  Vaping Use   Vaping Use: Never used  Substance and Sexual Activity   Alcohol use: Not Currently   Drug use: Never   Sexual activity: Yes    Partners: Male    Birth control/protection: None  Other Topics Concern   Not on file  Social History Narrative   Not on file   Social Determinants of Health   Financial Resource Strain: Low Risk  (04/25/2022)   Overall Financial Resource Strain (CARDIA)    Difficulty of Paying Living Expenses: Not hard at all  Food Insecurity: No Food Insecurity (04/25/2022)   Hunger Vital Sign    Worried About Running Out of Food in the Last Year: Never true    Ran Out of Food in the Last Year: Never true  Transportation Needs: No Transportation Needs (04/25/2022)   PRAPARE - Administrator, Civil Service (Medical): No    Lack of Transportation (Non-Medical): No  Physical Activity: Unknown (04/25/2022)   Exercise Vital Sign    Days of Exercise per Week: 0 days    Minutes of Exercise per Session: Not on file  Stress: Stress Concern Present (04/25/2022)   [employer] of Occupational Health - Occupational Stress Questionnaire    Feeling of Stress : Very much  Social Connections: Socially Isolated (04/25/2022)   Social Connection and Isolation Panel [NHANES]    Frequency of Communication with Friends and Family: More than three times a week    Frequency of Social Gatherings with Friends and Family: Once a week    Attends Religious Services: Never    Database administrator or Organizations: No    Attends Engineer, structural: Not on file    Marital Status: Widowed  Intimate Partner  Violence: Not on file    Physical Exam: Today's Vitals   06/07/22 1559 06/14/22 0710  BP:  129/70  Pulse:  100  Resp:  (!) 24  Temp:  99.3 F (37.4 C)  TempSrc:  Temporal  Weight: 55.8 kg 55.8 kg  Height:  4\' 11"  (1.499 m)   Body mass index is 24.84 kg/m. GEN: NAD EYE: Sclerae anicteric ENT: MMM CV: Non-tachycardic GI: Soft, NT/ND NEURO:  Alert & Oriented x 3  Lab Results: No results for input(s): "WBC", "HGB", "HCT", "PLT" in the last 72 hours. BMET No results for input(s): "NA", "K", "CL", "CO2", "GLUCOSE", "BUN", "CREATININE", "CALCIUM" in the  last 72 hours. LFT No results for input(s): "PROT", "ALBUMIN", "AST", "ALT", "ALKPHOS", "BILITOT", "BILIDIR", "IBILI" in the last 72 hours. PT/INR No results for input(s): "LABPROT", "INR" in the last 72 hours.   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  The risks of an EUS including intestinal perforation, bleeding, infection, aspiration, and medication effects were discussed as was the possibility it may not give a definitive diagnosis if a biopsy is performed.  When a biopsy of the pancreas is done as part of the EUS, there is an additional risk of pancreatitis at the rate of about 1-2%.  It was explained that procedure related pancreatitis is typically mild, although it can be severe and even life threatening, which is why we do not perform random pancreatic biopsies and only biopsy a lesion/area we feel is concerning enough to warrant the risk.   The risks and benefits of endoscopic evaluation/treatment were discussed with the patient and/or family; these include but are not limited to the risk of perforation, infection, bleeding, missed lesions, lack of diagnosis, severe illness requiring hospitalization, as well as anesthesia and sedation related illnesses.  The patient's history has been reviewed, patient examined, no change in status,  and deemed stable for procedure.  The patient and/or family is agreeable to proceed.    Corliss Parish, MD [facility] Office # [phone]

## 2022-06-14 NOTE — Discharge Instructions (Signed)
YOU HAD AN ENDOSCOPIC PROCEDURE TODAY: Refer to the procedure report and other information in the discharge instructions given to you for any specific questions about what was found during the examination. If this information does not answer your questions, please call [employer] office at [phone] to clarify.  ° °YOU SHOULD EXPECT: Some feelings of bloating in the abdomen. Passage of more gas than usual. Walking can help get rid of the air that was put into your GI tract during the procedure and reduce the bloating. If you had a lower endoscopy (such as a colonoscopy or flexible sigmoidoscopy) you may notice spotting of blood in your stool or on the toilet paper. Some abdominal soreness may be present for a day or two, also. ° °DIET: Your first meal following the procedure should be a light meal and then it is ok to progress to your normal diet. A half-sandwich or bowl of soup is an example of a good first meal. Heavy or fried foods are harder to digest and may make you feel nauseous or bloated. Drink plenty of fluids but you should avoid alcoholic beverages for 24 hours. If you had a esophageal dilation, please see attached instructions for diet.   ° °ACTIVITY: Your care partner should take you home directly after the procedure. You should plan to take it easy, moving slowly for the rest of the day. You can resume normal activity the day after the procedure however YOU SHOULD NOT DRIVE, use power tools, machinery or perform tasks that involve climbing or major physical exertion for 24 hours (because of the sedation medicines used during the test).  ° °SYMPTOMS TO REPORT IMMEDIATELY: °A gastroenterologist can be reached at any hour. Please call [phone]  for any of the following symptoms:  °Following lower endoscopy (colonoscopy, flexible sigmoidoscopy) °Excessive amounts of blood in the stool  °Significant tenderness, worsening of abdominal pains  °Swelling of the abdomen that is new, acute  °Fever of 100° or  higher  °Following upper endoscopy (EGD, EUS, ERCP, esophageal dilation) °Vomiting of blood or coffee ground material  °New, significant abdominal pain  °New, significant chest pain or pain under the shoulder blades  °Painful or persistently difficult swallowing  °New shortness of breath  °Black, tarry-looking or red, bloody stools ° °FOLLOW UP:  °If any biopsies were taken you will be contacted by phone or by letter within the next 1-3 weeks. Call [phone]  if you have not heard about the biopsies in 3 weeks.  °Please also call with any specific questions about appointments or follow up tests. ° °

## 2022-06-14 NOTE — Op Note (Signed)
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 Estimated Blood Loss:     Estimated blood loss was minimal. Procedure:                Pre-Anesthesia Assessment:                           - Prior to the procedure, a History and Physical                            was performed, and patient medications and                            allergies were reviewed. The patient's tolerance of                            previous anesthesia was also reviewed. The risks                            and benefits of the procedure and the sedation                            options and risks were discussed with the patient.                            All questions were answered, and informed consent                            was obtained. Prior Anticoagulants: The patient has                            taken no anticoagulant or antiplatelet agents                            except  for aspirin. ASA Grade Assessment: II - A                            patient with mild systemic disease. After reviewing                            the risks and benefits, the patient was deemed in                            satisfactory condition to undergo the procedure.                           After obtaining informed consent, the  endoscope was                            passed under direct vision. Throughout the                            procedure, the patient's blood pressure, pulse, and                            oxygen saturations were monitored continuously. The                            [id number]-[id number] ([id number]) Olympus endoscope was introduced                            through the mouth, and advanced to the second part                            of duodenum. The Radial EUS [id number] ([id number] ) was                            introduced through the mouth, and advanced to the                            stomach for ultrasound examination. The upper EUS                            was accomplished without difficulty. The patient                            tolerated the procedure. Scope In: Scope Out: Findings:      ENDOSCOPIC FINDING: :      No gross lesions were noted in the entire esophagus.      The Z-line was irregular and was found 35 cm from the incisors.      A single 9 mm subepithelial papule (nodule) with no bleeding and no       stigmata of recent bleeding was found in the gastric fundus.      No other gross lesions were noted in the entire examined stomach.      A single 6 mm sessile polyp was found in the duodenal bulb. The polyp  was removed with a cold snare. Resection and retrieval were complete.      No other gross lesions were noted in the duodenal bulb, in the first       portion of the duodenum and in the second portion of the duodenum.      ENDOSONOGRAPHIC FINDING: :      An oval intramural (subepithelial) lesion was found in the fundus of the       stomach. The lesion was hypoechoic. Sonographically, the lesion appeared       to originate from the muscularis propria (Layer 4). The lesion measured       8 mm (in maximum thickness). The lesion also measured 5 mm in diameter.       The outer endosonographic borders were smooth.      An anechoic lesion suggestive of a cyst was identified in the  visualized       portion of the left kidney. There was a single compartment. This is very       close to the intramural lesion noted above.      Endosonographic imaging in the visualized portion of the liver showed no       mass.      The celiac region was visualized. Impression:               EGD impression:                           - No gross lesions in the entire esophagus. Z-line                            irregular, 35 cm from the incisors.                           - A single subepithelial papule (nodule) found in                            the stomach cardia.                           - No other gross lesions in the entire stomach.                           - A single duodenal polyp. Resected and retrieved.                           - No other gross lesions in the duodenal bulb, in                            the first portion of the duodenum and in the second                            portion of the duodenum.                           EUS impression:                           - An intramural (subepithelial) lesion was  found in                            the fundus of the stomach. The lesion appeared to                            originate from within the muscularis propria (Layer                            4). Tissue has not been obtained. However, the                            endosonographic appearance is suggestive of a                            stromal cell lesion such as a GIST or leiomyoma. It                            is too small to sample with FNA/FNB. It has not                            changed in size over 2 years based on the previous                            EUS report.                           - A cystic lesion was identified in the left                            kidney. This is very close to the area of                            subepithelial lesion but separate. Moderate Sedation:      Not Applicable - Patient had care per Anesthesia. Recommendation:            - The patient will be observed post-procedure,                            until all discharge criteria are met.                           - Discharge patient to home.                           - Patient has a contact number available for                            emergencies. The signs and symptoms of potential                            delayed complications were discussed with the  patient. Return to normal activities tomorrow.                            Written discharge instructions were provided to the                            patient.                           - Resume previous diet.                           - Observe patient's clinical course.                           - Await path results.                           - Depending on final pathology, if evidence of                            adenomatous tissue, recommend 1 year follow-up EGD                            with primary GI. If no evidence of adenomatous                            tissue, then 2-year follow-up EUS to monitor the                            subepithelial lesion makes sense.                           - The findings and recommendations were discussed                            with the patient.                           - The findings and recommendations were discussed                            with the patient's family. Procedure Code(s):        --- Professional ---                           [phone], Esophagogastroduodenoscopy, flexible,                            transoral; with removal of tumor(s), polyp(s), or                            other lesion(s) by snare technique                           [id number], Esophagogastroduodenoscopy, flexible,  transoral; with endoscopic ultrasound examination                            limited to the esophagus, stomach or duodenum, and                            adjacent structures Diagnosis Code(s):        ---  Professional ---                           N28.89, Other specified disorders of kidney and                            ureter                           K22.89, Other specified disease of esophagus                           K31.89, Other diseases of stomach and duodenum                           K31.7, Polyp of stomach and duodenum                           C49.A2, Gastrointestinal stromal tumor of stomach CPT copyright 2022 American Medical Association. All rights reserved. The codes documented in this report are preliminary and upon coder review may  be revised to meet current compliance requirements. Corliss Parish, MD 06/14/2022 10:05:04 AM Number of Addenda: 0

## 2022-06-14 NOTE — Anesthesia Postprocedure Evaluation (Signed)
Anesthesia Post Note  Patient: Manali A Forgue  Procedure(s) Performed: UPPER ENDOSCOPIC ULTRASOUND (EUS) RADIAL ESOPHAGOGASTRODUODENOSCOPY (EGD) POLYPECTOMY     Patient location during evaluation: PACU Anesthesia Type: MAC Level of consciousness: awake and alert Pain management: pain level controlled Vital Signs Assessment: post-procedure vital signs reviewed and stable Respiratory status: spontaneous breathing, nonlabored ventilation, respiratory function stable and patient connected to nasal cannula oxygen Cardiovascular status: stable and blood pressure returned to baseline Postop Assessment: no apparent nausea or vomiting Anesthetic complications: no   No notable events documented.  Last Vitals:  Vitals:   06/14/22 1000 06/14/22 1005  BP: (!) 99/41 (!) 145/80  Pulse: 95 (!) 102  Resp: 17 19  Temp:  (!) 36.4 C  SpO2: 100% 99%    Last Pain:  Vitals:   06/14/22 1005  TempSrc: Temporal  PainSc:                  Nelle Don Michell Giuliano

## 2022-06-15 LAB — SURGICAL PATHOLOGY

## 2022-06-17 ENCOUNTER — Encounter: Payer: Self-pay | Admitting: Gastroenterology

## 2022-06-18 ENCOUNTER — Encounter (HOSPITAL_COMMUNITY): Payer: Self-pay | Admitting: Gastroenterology

## 2022-06-18 DIAGNOSIS — M79671 Pain in right foot: Secondary | ICD-10-CM | POA: Diagnosis not present

## 2022-06-20 ENCOUNTER — Other Ambulatory Visit: Payer: Self-pay

## 2022-06-20 ENCOUNTER — Other Ambulatory Visit (HOSPITAL_COMMUNITY): Payer: Self-pay

## 2022-06-20 ENCOUNTER — Other Ambulatory Visit: Payer: Self-pay | Admitting: Family Medicine

## 2022-06-20 DIAGNOSIS — I10 Essential (primary) hypertension: Secondary | ICD-10-CM

## 2022-06-20 MED ORDER — LOSARTAN POTASSIUM 50 MG PO TABS
50.0000 mg | ORAL_TABLET | Freq: Every day | ORAL | 1 refills | Status: DC
Start: 2022-06-20 — End: 2022-12-14
  Filled 2022-06-20: qty 90, 90d supply, fill #0
  Filled 2022-09-20: qty 90, 90d supply, fill #1

## 2022-06-22 ENCOUNTER — Ambulatory Visit (INDEPENDENT_AMBULATORY_CARE_PROVIDER_SITE_OTHER): Payer: PPO

## 2022-06-22 DIAGNOSIS — Z122 Encounter for screening for malignant neoplasm of respiratory organs: Secondary | ICD-10-CM

## 2022-06-22 DIAGNOSIS — Z87891 Personal history of nicotine dependence: Secondary | ICD-10-CM

## 2022-06-26 ENCOUNTER — Other Ambulatory Visit (HOSPITAL_COMMUNITY): Payer: Self-pay

## 2022-06-26 ENCOUNTER — Telehealth: Payer: Self-pay | Admitting: Acute Care

## 2022-06-26 ENCOUNTER — Other Ambulatory Visit: Payer: Self-pay

## 2022-06-26 ENCOUNTER — Encounter (HOSPITAL_COMMUNITY): Payer: Self-pay

## 2022-06-26 DIAGNOSIS — Z87891 Personal history of nicotine dependence: Secondary | ICD-10-CM

## 2022-06-26 DIAGNOSIS — Z122 Encounter for screening for malignant neoplasm of respiratory organs: Secondary | ICD-10-CM

## 2022-06-26 NOTE — Telephone Encounter (Signed)
Called and spoke to pt regarding her LDCT. Informed her of the new nodule seen, 5.80mm, and the recommendation to have a repeat scan in 6 months. Questions were answered. Pt verbalized understanding and denied any concerns. Order placed. Will close encounter.

## 2022-06-27 ENCOUNTER — Other Ambulatory Visit (HOSPITAL_COMMUNITY): Payer: Self-pay

## 2022-06-28 ENCOUNTER — Other Ambulatory Visit: Payer: Self-pay | Admitting: Family Medicine

## 2022-06-28 ENCOUNTER — Other Ambulatory Visit: Payer: Self-pay

## 2022-06-29 ENCOUNTER — Other Ambulatory Visit (HOSPITAL_COMMUNITY): Payer: Self-pay

## 2022-06-29 ENCOUNTER — Other Ambulatory Visit: Payer: Self-pay

## 2022-06-29 MED ORDER — BELSOMRA 20 MG PO TABS
1.0000 | ORAL_TABLET | Freq: Every evening | ORAL | 4 refills | Status: DC | PRN
  Filled 2022-06-29 – 2022-07-05 (×3): qty 30, 30d supply, fill #0
  Filled 2022-07-30: qty 30, 30d supply, fill #1
  Filled 2022-08-30: qty 30, 30d supply, fill #2
  Filled 2022-10-04: qty 30, 30d supply, fill #3
  Filled 2022-11-09: qty 30, 30d supply, fill #4

## 2022-06-29 NOTE — Telephone Encounter (Signed)
Last OV: 04/26/22 Next OV: 08/03/22 Last RF: 02/01/22

## 2022-07-02 ENCOUNTER — Other Ambulatory Visit (HOSPITAL_COMMUNITY): Payer: Self-pay

## 2022-07-04 ENCOUNTER — Other Ambulatory Visit: Payer: Self-pay

## 2022-07-04 ENCOUNTER — Other Ambulatory Visit (HOSPITAL_COMMUNITY): Payer: Self-pay

## 2022-07-05 ENCOUNTER — Other Ambulatory Visit (HOSPITAL_COMMUNITY): Payer: Self-pay

## 2022-07-05 ENCOUNTER — Other Ambulatory Visit: Payer: Self-pay

## 2022-07-10 ENCOUNTER — Ambulatory Visit (HOSPITAL_BASED_OUTPATIENT_CLINIC_OR_DEPARTMENT_OTHER): Payer: PPO | Admitting: Pulmonary Disease

## 2022-07-10 DIAGNOSIS — F17210 Nicotine dependence, cigarettes, uncomplicated: Secondary | ICD-10-CM | POA: Diagnosis not present

## 2022-07-10 DIAGNOSIS — E7849 Other hyperlipidemia: Secondary | ICD-10-CM | POA: Diagnosis not present

## 2022-07-10 DIAGNOSIS — Z79899 Other long term (current) drug therapy: Secondary | ICD-10-CM | POA: Diagnosis not present

## 2022-07-10 DIAGNOSIS — J441 Chronic obstructive pulmonary disease with (acute) exacerbation: Secondary | ICD-10-CM | POA: Diagnosis not present

## 2022-07-10 DIAGNOSIS — Z7982 Long term (current) use of aspirin: Secondary | ICD-10-CM | POA: Diagnosis not present

## 2022-07-10 DIAGNOSIS — J439 Emphysema, unspecified: Secondary | ICD-10-CM | POA: Diagnosis not present

## 2022-07-10 DIAGNOSIS — Z9981 Dependence on supplemental oxygen: Secondary | ICD-10-CM | POA: Diagnosis not present

## 2022-07-10 DIAGNOSIS — E119 Type 2 diabetes mellitus without complications: Secondary | ICD-10-CM | POA: Diagnosis not present

## 2022-07-10 DIAGNOSIS — Z7951 Long term (current) use of inhaled steroids: Secondary | ICD-10-CM | POA: Diagnosis not present

## 2022-07-10 DIAGNOSIS — I10 Essential (primary) hypertension: Secondary | ICD-10-CM | POA: Diagnosis not present

## 2022-07-10 DIAGNOSIS — Z853 Personal history of malignant neoplasm of breast: Secondary | ICD-10-CM | POA: Diagnosis not present

## 2022-07-10 DIAGNOSIS — Z7984 Long term (current) use of oral hypoglycemic drugs: Secondary | ICD-10-CM | POA: Diagnosis not present

## 2022-07-10 DIAGNOSIS — J440 Chronic obstructive pulmonary disease with acute lower respiratory infection: Secondary | ICD-10-CM | POA: Diagnosis not present

## 2022-07-11 DIAGNOSIS — J441 Chronic obstructive pulmonary disease with (acute) exacerbation: Secondary | ICD-10-CM | POA: Diagnosis not present

## 2022-07-11 DIAGNOSIS — E7849 Other hyperlipidemia: Secondary | ICD-10-CM | POA: Diagnosis not present

## 2022-07-11 DIAGNOSIS — I10 Essential (primary) hypertension: Secondary | ICD-10-CM | POA: Diagnosis not present

## 2022-07-27 ENCOUNTER — Ambulatory Visit (HOSPITAL_BASED_OUTPATIENT_CLINIC_OR_DEPARTMENT_OTHER): Payer: PPO | Admitting: Pulmonary Disease

## 2022-07-27 ENCOUNTER — Encounter (HOSPITAL_BASED_OUTPATIENT_CLINIC_OR_DEPARTMENT_OTHER): Payer: Self-pay | Admitting: Pulmonary Disease

## 2022-07-27 ENCOUNTER — Other Ambulatory Visit: Payer: Self-pay

## 2022-07-27 ENCOUNTER — Other Ambulatory Visit (HOSPITAL_BASED_OUTPATIENT_CLINIC_OR_DEPARTMENT_OTHER): Payer: Self-pay

## 2022-07-27 ENCOUNTER — Other Ambulatory Visit (HOSPITAL_COMMUNITY): Payer: Self-pay

## 2022-07-27 VITALS — BP 116/74 | HR 88 | Ht 59.0 in | Wt 117.0 lb

## 2022-07-27 DIAGNOSIS — J9611 Chronic respiratory failure with hypoxia: Secondary | ICD-10-CM

## 2022-07-27 DIAGNOSIS — J441 Chronic obstructive pulmonary disease with (acute) exacerbation: Secondary | ICD-10-CM

## 2022-07-27 DIAGNOSIS — J449 Chronic obstructive pulmonary disease, unspecified: Secondary | ICD-10-CM | POA: Diagnosis not present

## 2022-07-27 MED ORDER — FLUTICASONE-SALMETEROL 230-21 MCG/ACT IN AERO
2.0000 | INHALATION_SPRAY | Freq: Two times a day (BID) | RESPIRATORY_TRACT | 1 refills | Status: DC
  Filled 2022-07-27: qty 36, 90d supply, fill #0

## 2022-07-27 MED ORDER — PREDNISONE 10 MG PO TABS: ORAL_TABLET | ORAL | 0 refills | Status: DC

## 2022-07-27 MED ORDER — AMOXICILLIN-POT CLAVULANATE 875-125 MG PO TABS
1.0000 | ORAL_TABLET | Freq: Two times a day (BID) | ORAL | 0 refills | Status: DC
Start: 2022-07-27 — End: 2022-09-05

## 2022-07-27 MED ORDER — SPIRIVA RESPIMAT 2.5 MCG/ACT IN AERS
2.0000 | INHALATION_SPRAY | Freq: Every day | RESPIRATORY_TRACT | 2 refills | Status: DC
  Filled 2022-07-27: qty 12, 90d supply, fill #0

## 2022-07-27 NOTE — Progress Notes (Signed)
Subjective:   PATIENT ID: Sherri Gibson GENDER: female DOB: 06-Nov-1960, MRN: [id number]   HPI  Chief Complaint  Patient presents with   Follow-up    F/U for COPD. States she was in the hospital earlier this morning for a COPD exacerbation.    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 Kendrick Fries patient, last seen September 2019.  She was initially diagnosed with COPD in 2010 and has been on oxygen since 2017. She has been compliant with her Symbicort 160-4.5 mcg 2 puffs twice daily and Spiriva 2.5 mcg 2 puffs daily. She lives in a 3 story house and will need to wear her oxygen with it. She walks regularly on her treadmill. She is able to walk upstairs and around the house. Able to do yardwork. No limitations in activity except when needing to carry things due to her shoulder issues. She wears her oxygen overnight but unsure if she actually drops.   11/25/20 Her husband is in hospice so she is unable to leave her home for medical appointments right now. Her family had RSV which led to her having productive cough, nasal congestion, wheezing and shortness of breath. Has improved with hot tea and inhalers. No longer having symptoms. She has purchased a Designer, jewellery and has been measuring her home O2 around 93%. Wears her oxygen nightly and with activity if it drops below 88%.   02-16-21 Her husband passed away 2022/12/03. After Thanksgiving she was went to the ED for hypoxemia secondary to COPD exacerbation. Treated with steroids and nebs. Followed up by her PCP with given doxycycline and cough syrup. Denies wheezing. Occasional dry cough. Shortness of breath with exertion including walking upstairs so she wears oxygen more frequently lately. Has  not been active recently, preferring to stay at home. Has had difficulty sleeping. Her niece will check on her periodically.  05/29/21 Since our last visit COVID in March with cough and nasal congestion. Took steroids. Did not take paxlovid due to being out of the window. Has noticed that she needs her oxygen in the evening ~3 times a week for SpO2 <88%. Has not been using nebulizers recently when she was previously using it 1-2 times daily  10/30/21 Two days ago she started having productive cough, chest congestion, sinus congestion. Her oxygen levels are 92%. Has not needed oxygen. Denies wheezing. Compliant with oxygen at night. No exacerbations since winter 2022. She recently had to put down her dog that she rescued with her past husband.   01/15/22 Last week she went to [place]. Developed a cough four days ago. COVID test neg. Reports productive cough, fatigue and malaise. Has been wearing oxygen at night. Her SpO2 levels 90-91%. Walking 15 ft was difficult and had to stop due to feeling winded. Reports chest rattle and wheezing. She has more exposure to her nephews and they are in daycare  07/27/22 Since our last visit she was seen for COPD exacerbation by her PCP Dr. Ashley Royalty in March x 2, urgent care/ED on 07/10/22. Notes reviewed in EMR. Has been wearing her O2 continuously 24/7 which is increased from nightly. Was recently treated with with nebs, steroids and azithromycin. She has finished She is Advair and Spiriva - currently in the donut hole. She continues to have productive,  congested cough, shortness of breath. Only wheezing when laying down. Uses albuterol once a day but it gives her the shakes.  Currently in the middle of selling her home  Social History: She smoked for many years, typically 2 packs per day from age [age].  She quit with Chantix in 2016/08/22. 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  Past Medical History:  Diagnosis Date   Anemia    as a child   Arthritis    Both Shoulders   Breast cancer (HCC) August 23, 2014   Left    Complication of anesthesia    states she had a hard time staying awake after hand surgery   COPD (chronic obstructive pulmonary disease) (HCC)    uses O2 at 2l at night   High blood pressure    High cholesterol    History of kidney stones    Personal history of radiation therapy 08-23-2014   Left Breast Cancer   Pneumonia    as a child   Pre-diabetes    Prediabetes    Wears dentures    upper dentures     Outpatient Medications Prior to Visit  Medication Sig Dispense Refill   albuterol (PROVENTIL) (2.5 MG/3ML) 0.083% nebulizer solution Take 3 mLs (2.5 mg total) by nebulization every 6 (six) hours as needed for wheezing or shortness of breath. 99 mL 1   albuterol (VENTOLIN HFA) 108 (90 Base) MCG/ACT inhaler Inhale 2 puffs into the lungs every 4 (four) hours as needed for wheezing or shortness of breath. 6.7 g 4   amLODipine (NORVASC) 10 MG tablet Take 1 tablet (10 mg total) by mouth daily. 90 tablet 1   aspirin EC 81 MG tablet Take 81 mg by mouth every evening. Swallow whole.     atorvastatin (LIPITOR) 40 MG tablet Take 1 tablet (40 mg total) by mouth every evening. 90 tablet 1   chlorpheniramine-HYDROcodone (TUSSIONEX) 10-8 MG/5ML Take 5 mLs by mouth at bedtime as needed for cough. 100 mL 0   cyclobenzaprine (FLEXERIL) 10 MG tablet Take 10 mg by mouth daily as needed for muscle spasms.     loratadine (CLARITIN) 10 MG tablet TAKE 1 TABLET BY MOUTH EVERY DAY 90 tablet 1   losartan (COZAAR) 50 MG tablet Take 1 tablet (50 mg total) by mouth daily. 90 tablet 1   metFORMIN (GLUCOPHAGE) 500 MG tablet Take 2 tablets (1,000 mg total) by mouth daily. 180 tablet 1   montelukast (SINGULAIR) 10 MG tablet Take 1 tablet (10 mg total) by mouth at bedtime. (Patient taking differently: Take 1,800 mg by mouth at bedtime.) 90 tablet 3   OXYGEN Inhale 2 L into the  lungs at bedtime.     pantoprazole (PROTONIX) 40 MG tablet Take 1 tablet (40 mg total) by mouth 2 (two) times daily. 180 tablet 5   Respiratory Therapy Supplies (NEBULIZER) DEVI As directed for Dx COPD 1 each 99   Suvorexant (BELSOMRA) 20 MG TABS Take 1 tablet by mouth at bedtime as needed. 30 tablet 4   Tiotropium Bromide Monohydrate (SPIRIVA RESPIMAT) 2.5 MCG/ACT AERS Inhale 2 puffs into the lungs daily. 12 g 2   fluticasone-salmeterol (WIXELA INHUB) 250-50 MCG/ACT AEPB Inhale 1 puff into the lungs in the morning and at bedtime. 180 each 2   albuterol (VENTOLIN HFA) 108 (90 Base) MCG/ACT inhaler Inhale 2 puffs into the lungs every 4 (four) hours as needed for wheezing or  shortness of breath. 6.7 g 4   budesonide-formoterol (SYMBICORT) 160-4.5 MCG/ACT inhaler Inhale 2 puffs into the lungs 2 (two) times daily.     predniSONE (STERAPRED UNI-PAK 48 TAB) 5 MG (48) TBPK tablet Taper as directed on packaging. 48 tablet 0   Respiratory Therapy Supplies (NEBULIZER AIR TUBE/PLUGS) MISC As directed w/ nebulizer for Dx COPD 1 each 99   No facility-administered medications prior to visit.    Review of Systems  Constitutional:  Negative for chills, diaphoresis, fever, malaise/fatigue and weight loss.  HENT:  Negative for congestion.   Respiratory:  Positive for cough, sputum production, shortness of breath and wheezing. Negative for hemoptysis.   Cardiovascular:  Negative for chest pain, palpitations and leg swelling.    Objective:   Vitals:   07/27/22 0821  BP: 116/74  Pulse: 88  SpO2: 96%  Weight: 117 lb (53.1 kg)  Height: 4\' 11"  (1.499 m)    SpO2: 96 % (on RA)   Physical Exam: General: Appears that stated age, no acute distress HENT: [place], AT Eyes: EOMI, no scleral icterus Respiratory: Diminished with occasional wheezes to auscultation bilaterally.  No crackles, wheezing or rales Cardiovascular: RRR, -M/R/G, no JVD Extremities:-Edema,-tenderness Neuro: AAO x4, CNII-XII grossly  intact Psych: Normal mood, normal affect  Data Reviewed:  Imaging: 10/2015 CT chest [facility] multiple calcified granulomatous largest is 8 mm, 2 noncalcified 3 mm left lower lobe nodules 11/2016 CT chest 6-53mm nodules Right upper an lower nodules (per patient this had been seen in [place] knew about this as well).   04/13/19 CT Lung Screen - Bilateral calcified granulomas bilaterally. Several noncalcified tiny nodules with largest 3mm, unchanged. Centrilobular emphysema 05/16/20 CT Lung Screen -Unchanged bilateral small calcified and uncalcified nodules <21mm. Centrilobular emphysema 06/20/21 CT Lung Screen - Unchanged bilateral calcified and uncalcified nodules <15mm CT lung screen 06/22/22 - Overall stable lung nodules except for new RLL nodule ~60mm in size  PFT: 2017 PFT from [facility] Ratio 53% FEV1 1.03 L 45% predicted, no air trapping, diffusion capacity 35% predicted  Ambulatory O2: 02/15/21 - No desaturations. SpO2 Nadir 90%    Assessment & Plan:   Discussion: 62 year old female with severe COPD and emphysema, subcentimeter lung nodules and hx srage I IDC of the left breast with recurrence s/p lumpectomy and radiation who presents for follow-up.  She has had multiple COPD exacerbations (>5) in the last year. Uncontrolled symptoms on current regimen. Discussed utilizing nebulizer more. Will change from powder based to [facility] to increase compliance. Ambulatory O2 in clinic demonstrates  Severe COPD  COPD exacerbation Prednisone 40 mg x 5 days Augmentin x 7 days STOP Wixela START Advair HFA 230-21 mcg TWO puff twice in the morning and evening CONTINUE Spiriva 2.5 mcg 2 puffs daily CONTINUE Albuterol nebulizer 2-3 times a day as needed (morning, noon, early evening) Encourage regular aerobic activity   Exertional hypoxemia Nocturnal oxygen Wear oxygen nasal cannula for goal SpO2 >88% Continue 2L via O2 with activity  Continue 2L at night  Multiple lung  nodules - overall stable. New RLL nodule 6mm Lung screen in 6 months    Health Maintenance Immunization History  Administered Date(s) Administered   Influenza, Seasonal, Injecte, Preservative Fre 10/15/2016   Influenza,inj,Quad PF,6+ Mos 10/15/2016, 09/23/2017, 09/16/2018, 10/14/2019, 10/04/2020   Influenza,inj,Quad PF,6-35 Mos 10/20/2021   Moderna Covid Bivalent Peds Booster(61mo Thru 86yrs) 10/14/2021   PFIZER(Purple Top)SARS-COV-2 Vaccination 04/14/2019, 05/05/2019, 12/05/2019   PNEUMOCOCCAL CONJUGATE-20 12/19/2020   Pneumococcal Polysaccharide-23 05/29/2017  Respiratory Syncytial Virus Vaccine,Recomb Aduvanted(Arexvy) 10/17/2021   Tdap 05/29/2017   Zoster Recombinat (Shingrix) 04/08/2020, 08/13/2020   CT Lung Screen - Due 04/2021  No orders of the defined types were placed in this encounter.  Meds ordered this encounter  Medications   fluticasone-salmeterol (ADVAIR HFA) 230-21 MCG/ACT inhaler    Sig: Inhale 2 puffs into the lungs 2 (two) times daily.    Dispense:  36 g    Refill:  1   predniSONE (DELTASONE) 10 MG tablet    Sig: Take 4 tablets (40 mg total) by mouth daily with breakfast for 2 days, THEN 3 tablets (30 mg total) daily with breakfast for 2 days, THEN 2 tablets (20 mg total) daily with breakfast for 2 days, THEN 1 tablet (10 mg total) daily with breakfast for 2 days.    Dispense:  20 tablet    Refill:  0   amoxicillin-clavulanate (AUGMENTIN) 875-125 MG tablet    Sig: Take 1 tablet by mouth 2 (two) times daily.    Dispense:  14 tablet    Refill:  0   Return in about 1 month (around 08/26/2022).  I have spent a total time of 35-minutes on the day of the appointment including chart review, data review, collecting history, coordinating care and discussing medical diagnosis and plan with the patient/family. Past medical history, allergies, medications were reviewed. Pertinent imaging, labs and tests included in this note have been reviewed and interpreted independently  by me.  Tony Friscia Mechele Collin, MD [facility] 07/27/2022 8:49 AM  Office Number [phone]

## 2022-07-27 NOTE — Addendum Note (Signed)
Addended by: Maurene Capes on: 07/27/2022 09:19 AM   Modules accepted: Orders

## 2022-07-27 NOTE — Patient Instructions (Addendum)
Severe COPD - uncontrolled COPD exacerbation Prednisone 40 mg x 5 days Augmentin x 7 days STOP Wixela START Advair HFA 230-21 mcg TWO puff twice in the morning and evening CONTINUE Spiriva 2.5 mcg 2 puffs daily CONTINUE Albuterol nebulizer 2-3 times a day as needed (morning, noon, early evening) Encourage regular aerobic activity   Exertional hypoxemia Nocturnal oxygen Wear oxygen nasal cannula for goal SpO2 >88% Continue 2L via O2 with activity  Continue 2L at night  Multiple lung nodules - overall stable. New RLL nodule 6mm Lung screen in 6 months

## 2022-07-27 NOTE — Addendum Note (Signed)
Addended by: Luciano Cutter on: 07/27/2022 08:59 AM   Modules accepted: Orders

## 2022-07-30 ENCOUNTER — Other Ambulatory Visit (HOSPITAL_COMMUNITY): Payer: Self-pay

## 2022-07-30 ENCOUNTER — Other Ambulatory Visit: Payer: Self-pay

## 2022-07-31 ENCOUNTER — Encounter: Payer: Self-pay | Admitting: Gastroenterology

## 2022-07-31 ENCOUNTER — Other Ambulatory Visit: Payer: Self-pay

## 2022-08-03 ENCOUNTER — Ambulatory Visit (INDEPENDENT_AMBULATORY_CARE_PROVIDER_SITE_OTHER): Payer: PPO | Admitting: Family Medicine

## 2022-08-03 ENCOUNTER — Encounter: Payer: Self-pay | Admitting: Family Medicine

## 2022-08-03 VITALS — BP 110/68 | HR 95 | Ht 59.0 in | Wt 114.0 lb

## 2022-08-03 DIAGNOSIS — Z7984 Long term (current) use of oral hypoglycemic drugs: Secondary | ICD-10-CM | POA: Diagnosis not present

## 2022-08-03 DIAGNOSIS — E785 Hyperlipidemia, unspecified: Secondary | ICD-10-CM | POA: Diagnosis not present

## 2022-08-03 DIAGNOSIS — J441 Chronic obstructive pulmonary disease with (acute) exacerbation: Secondary | ICD-10-CM

## 2022-08-03 DIAGNOSIS — E1169 Type 2 diabetes mellitus with other specified complication: Secondary | ICD-10-CM | POA: Diagnosis not present

## 2022-08-03 DIAGNOSIS — J9611 Chronic respiratory failure with hypoxia: Secondary | ICD-10-CM

## 2022-08-03 DIAGNOSIS — I10 Essential (primary) hypertension: Secondary | ICD-10-CM | POA: Diagnosis not present

## 2022-08-03 LAB — POCT GLYCOSYLATED HEMOGLOBIN (HGB A1C): HbA1c, POC (controlled diabetic range): 6.7 % (ref 0.0–7.0)

## 2022-08-03 MED ORDER — PREDNISONE 20 MG PO TABS: 20.0000 mg | ORAL_TABLET | Freq: Two times a day (BID) | ORAL | 0 refills | Status: AC

## 2022-08-03 NOTE — Assessment & Plan Note (Signed)
Continue atorvastatin at current strength, she is taking this more consistently now.  Lab Results  Component Value Date   LDLCALC 130 (H) 02/01/2022

## 2022-08-03 NOTE — Assessment & Plan Note (Signed)
Extending steroids for an additional 2 days.  She will contact me or her pulmonologist for worsening symptoms.

## 2022-08-03 NOTE — Assessment & Plan Note (Signed)
Blood sugars remain pretty well controlled.  Continue metformin at current strength.

## 2022-08-03 NOTE — Assessment & Plan Note (Signed)
BP remains well controlled.  Continue current medications for management of HTN.   

## 2022-08-03 NOTE — Patient Instructions (Signed)
Complete 2 additional days of prednisone.  Let us know if symptoms worsen.

## 2022-08-03 NOTE — Progress Notes (Signed)
Sherri Gibson - 62 y.o. female MRN [id number]  Date of birth: 1960/07/03  Subjective Chief Complaint  Patient presents with   Hypertension    HPI Sherri Gibson is 62 y.o. female here today for follow up visit.   She reports that she is doing ok today.   She does continue to see pulmonology for severe COPD.  Remains on Advair and Spiriva with albuterol as needed.  She remains on O2 at night. She was treated for COPD exacerbation last month, hospitalized in [place].  Treated with steroids and nebs and antibiotics.   She has seen her pulmonologist since then.  She completed her last day of antibiotics yesterday and will complete steroids today.  She still feels congested with some wheezing.    Remains on metformin for management of T2DM.  She is doing well with this.  No notable side effects related to this.  BP remains well controlled, continues on losartan and amlodipine.  Remains on lipitor for associated HLD.    She is sleeping pretty well with Belsomra.    ROS:  A comprehensive ROS was completed and negative except as noted per HPI   Allergies  Allergen Reactions   Codeine Itching    Feels like something crawling on her skin    Tetanus Toxoids Swelling    Patient stated,"I was given a Pneumonia shot at the same time I got the Tetanus shot, in the same arm."    Past Medical History:  Diagnosis Date   Anemia    as a child   Arthritis    Both Shoulders   Breast cancer (HCC) 2016   Left    Complication of anesthesia    states she had a hard time staying awake after hand surgery   COPD (chronic obstructive pulmonary disease) (HCC)    uses O2 at 2l at night   High blood pressure    High cholesterol    History of kidney stones    Personal history of radiation therapy 2016   Left Breast Cancer   Pneumonia    as a child   Pre-diabetes    Prediabetes    Wears dentures    upper dentures    Past Surgical History:  Procedure Laterality Date   BIOPSY   07/11/2020   Procedure: BIOPSY;  Surgeon: Shellia Cleverly, DO;  Location: [facility];  Service: Gastroenterology;;   BIOPSY  08/11/2020   Procedure: BIOPSY;  Surgeon: Rachael Fee, MD;  Location: [facility];  Service: Endoscopy;;   BREAST BIOPSY     BREAST LUMPECTOMY Left 2021   BREAST LUMPECTOMY WITH AXILLARY LYMPH NODE BIOPSY  2016   COLONOSCOPY  2017   [facility] [place] [facility]. think there was colon polyps   COLONOSCOPY WITH PROPOFOL N/A 07/11/2020   Procedure: COLONOSCOPY WITH PROPOFOL;  Surgeon: Shellia Cleverly, DO;  Location: [facility];  Service: Gastroenterology;  Laterality: N/A;   ESOPHAGOGASTRODUODENOSCOPY N/A 06/14/2022   Procedure: ESOPHAGOGASTRODUODENOSCOPY (EGD);  Surgeon: Lemar Lofty., MD;  Location: [facility];  Service: Gastroenterology;  Laterality: N/A;   ESOPHAGOGASTRODUODENOSCOPY (EGD) WITH PROPOFOL N/A 07/11/2020   Procedure: ESOPHAGOGASTRODUODENOSCOPY (EGD) WITH PROPOFOL;  Surgeon: Shellia Cleverly, DO;  Location: [facility];  Service: Gastroenterology;  Laterality: N/A;   ESOPHAGOGASTRODUODENOSCOPY (EGD) WITH PROPOFOL N/A 08/11/2020   Procedure: ESOPHAGOGASTRODUODENOSCOPY (EGD) WITH PROPOFOL;  Surgeon: Rachael Fee, MD;  Location: [facility];  Service: Endoscopy;  Laterality: N/A;   EUS N/A 08/11/2020   Procedure: UPPER  ENDOSCOPIC ULTRASOUND (EUS) RADIAL;  Surgeon: Rachael Fee, MD;  Location: [facility];  Service: Endoscopy;  Laterality: N/A;   EUS N/A 06/14/2022   Procedure: UPPER ENDOSCOPIC ULTRASOUND (EUS) RADIAL;  Surgeon: Lemar Lofty., MD;  Location: [facility];  Service: Gastroenterology;  Laterality: N/A;   HAND SURGERY Left    PARTIAL HYSTERECTOMY     POLYPECTOMY  06/14/2022   Procedure: POLYPECTOMY;  Surgeon: Meridee Score Netty Starring., MD;  Location: [facility];  Service: Gastroenterology;;   Gaspar Bidding DILATION N/A 07/11/2020   Procedure: Gaspar Bidding DILATION;  Surgeon: Shellia Cleverly,  DO;  Location: [facility];  Service: Gastroenterology;  Laterality: N/A;   TOTAL SHOULDER ARTHROPLASTY Left 04/12/2020   Procedure: LEFT REVERSE TOTAL SHOULDER ARTHROPLASTY;  Surgeon: Cammy Copa, MD;  Location: [facility];  Service: Orthopedics;  Laterality: Left;   TOTAL SHOULDER ARTHROPLASTY Right 09/13/2020   Procedure: RIGHT TOTAL SHOULDER ARTHROPLASTY;  Surgeon: Cammy Copa, MD;  Location: [facility] OR;  Service: Orthopedics;  Laterality: Right;    Social History   Socioeconomic History   Marital status: Widowed    Spouse name: Not on file   Number of children: 0   Years of education: Not on file   Highest education level: GED or equivalent  Occupational History   Not on file  Tobacco Use   Smoking status: Former    Packs/day: 2.50    Years: 44.00    Additional pack years: 0.00    Total pack years: 110.00    Types: Cigarettes    Quit date: 11/29/2016    Years since quitting: 5.6   Smokeless tobacco: Never  Vaping Use   Vaping Use: Never used  Substance and Sexual Activity   Alcohol use: Not Currently   Drug use: Never   Sexual activity: Yes    Partners: Male    Birth control/protection: None  Other Topics Concern   Not on file  Social History Narrative   Not on file   Social Determinants of Health   Financial Resource Strain: Low Risk  (04/25/2022)   Overall Financial Resource Strain (CARDIA)    Difficulty of Paying Living Expenses: Not hard at all  Food Insecurity: No Food Insecurity (04/25/2022)   Hunger Vital Sign    Worried About Running Out of Food in the Last Year: Never true    Ran Out of Food in the Last Year: Never true  Transportation Needs: No Transportation Needs (04/25/2022)   PRAPARE - Administrator, Civil Service (Medical): No    Lack of Transportation (Non-Medical): No  Physical Activity: Unknown (04/25/2022)   Exercise Vital Sign    Days of Exercise per Week: 0 days    Minutes of Exercise per Session: Not on file  Stress:  Stress Concern Present (04/25/2022)   [employer] of Occupational Health - Occupational Stress Questionnaire    Feeling of Stress : Very much  Social Connections: Socially Isolated (04/25/2022)   Social Connection and Isolation Panel [NHANES]    Frequency of Communication with Friends and Family: More than three times a week    Frequency of Social Gatherings with Friends and Family: Once a week    Attends Religious Services: Never    Database administrator or Organizations: No    Attends Engineer, structural: Not on file    Marital Status: Widowed    Family History  Problem Relation Age of Onset   Breast cancer Mother    Lung cancer Father  smoker   Diabetes Brother    Colon cancer Neg Hx    Esophageal cancer Neg Hx     Health Maintenance  Topic Date Due   Medicare Annual Wellness (AWV)  09/03/2022 (Originally 1960-09-11)   OPHTHALMOLOGY EXAM  10/31/2022 (Originally 12/19/2021)   COVID-19 Vaccine (5 - 2023-24 season) 02/02/2023 (Originally 12/09/2021)   Hepatitis C Screening  08/03/2023 (Originally 06/17/1978)   INFLUENZA VACCINE  08/30/2022   Diabetic kidney evaluation - eGFR measurement  02/02/2023   Diabetic kidney evaluation - Urine ACR  02/02/2023   FOOT EXAM  02/02/2023   HEMOGLOBIN A1C  02/03/2023   Lung Cancer Screening  06/22/2023   MAMMOGRAM  04/17/2024   DTaP/Tdap/Td (2 - Td or Tdap) 05/30/2027   Colonoscopy  07/12/2030   HIV Screening  Completed   Zoster Vaccines- Shingrix  Completed   HPV VACCINES  Aged Out   PAP SMEAR-Modifier  Discontinued     ----------------------------------------------------------------------------------------------------------------------------------------------------------------------------------------------------------------- Physical Exam BP 110/68 (BP Location: Right Arm, Patient Position: Sitting, Cuff Size: Small)   Pulse 95   Ht 4\' 11"  (1.499 m)   Wt 114 lb (51.7 kg)   SpO2 95%   BMI 23.03 kg/m    Physical Exam Constitutional:      Appearance: Normal appearance.  Eyes:     General: No scleral icterus. Cardiovascular:     Rate and Rhythm: Normal rate and regular rhythm.  Pulmonary:     Effort: Pulmonary effort is normal.     Breath sounds: Rhonchi (scattered) present.  Neurological:     Mental Status: She is alert.  Psychiatric:        Mood and Affect: Mood normal.        Behavior: Behavior normal.     ------------------------------------------------------------------------------------------------------------------------------------------------------------------------------------------------------------------- Assessment and Plan  COPD with exacerbation (HCC) Extending steroids for an additional 2 days.  She will contact me or her pulmonologist for worsening symptoms.   Chronic hypoxemic respiratory failure (HCC) Continue O2 at night.   Essential hypertension BP remains well controlled.  Continue current medications for management of HTN.   Hyperlipidemia associated with type 2 diabetes mellitus (HCC) Continue atorvastatin at current strength, she is taking this more consistently now.  Lab Results  Component Value Date   LDLCALC 130 (H) 02/01/2022     Type 2 diabetes mellitus with hyperlipidemia (HCC) Blood sugars remain pretty well controlled.  Continue metformin at current strength.     Meds ordered this encounter  Medications   predniSONE (DELTASONE) 20 MG tablet    Sig: Take 1 tablet (20 mg total) by mouth 2 (two) times daily with a meal for 2 days.    Dispense:  4 tablet    Refill:  0    No follow-ups on file.    This visit occurred during the SARS-CoV-2 public health emergency.  Safety protocols were in place, including screening questions prior to the visit, additional usage of staff PPE, and extensive cleaning of exam room while observing appropriate contact time as indicated for disinfecting solutions.

## 2022-08-03 NOTE — Assessment & Plan Note (Signed)
Continue O2 at night.  

## 2022-08-27 ENCOUNTER — Other Ambulatory Visit (HOSPITAL_COMMUNITY): Payer: Self-pay

## 2022-08-30 ENCOUNTER — Other Ambulatory Visit: Payer: Self-pay | Admitting: Family Medicine

## 2022-08-30 DIAGNOSIS — E1169 Type 2 diabetes mellitus with other specified complication: Secondary | ICD-10-CM

## 2022-08-30 DIAGNOSIS — E782 Mixed hyperlipidemia: Secondary | ICD-10-CM

## 2022-08-31 ENCOUNTER — Other Ambulatory Visit: Payer: Self-pay

## 2022-08-31 ENCOUNTER — Other Ambulatory Visit (HOSPITAL_COMMUNITY): Payer: Self-pay

## 2022-08-31 MED ORDER — ATORVASTATIN CALCIUM 40 MG PO TABS
40.0000 mg | ORAL_TABLET | Freq: Every evening | ORAL | 1 refills | Status: DC
Start: 2022-08-31 — End: 2023-06-30
  Filled 2022-08-31: qty 90, 90d supply, fill #0
  Filled 2022-12-14: qty 90, 90d supply, fill #1

## 2022-08-31 MED ORDER — METFORMIN HCL 500 MG PO TABS
1000.0000 mg | ORAL_TABLET | Freq: Every day | ORAL | 1 refills | Status: DC
Start: 2022-08-31 — End: 2023-02-22
  Filled 2022-08-31: qty 180, 90d supply, fill #0
  Filled 2022-11-26: qty 180, 90d supply, fill #1

## 2022-09-05 ENCOUNTER — Other Ambulatory Visit (HOSPITAL_COMMUNITY): Payer: Self-pay

## 2022-09-05 ENCOUNTER — Ambulatory Visit (HOSPITAL_BASED_OUTPATIENT_CLINIC_OR_DEPARTMENT_OTHER): Payer: PPO | Admitting: Pulmonary Disease

## 2022-09-05 ENCOUNTER — Encounter (HOSPITAL_BASED_OUTPATIENT_CLINIC_OR_DEPARTMENT_OTHER): Payer: Self-pay | Admitting: Pulmonary Disease

## 2022-09-05 VITALS — BP 100/68 | HR 90 | Resp 18 | Ht 59.0 in | Wt 115.8 lb

## 2022-09-05 DIAGNOSIS — J449 Chronic obstructive pulmonary disease, unspecified: Secondary | ICD-10-CM | POA: Diagnosis not present

## 2022-09-05 DIAGNOSIS — J9611 Chronic respiratory failure with hypoxia: Secondary | ICD-10-CM

## 2022-09-05 DIAGNOSIS — R918 Other nonspecific abnormal finding of lung field: Secondary | ICD-10-CM | POA: Diagnosis not present

## 2022-09-05 MED ORDER — SPIRIVA RESPIMAT 2.5 MCG/ACT IN AERS: 2.0000 | INHALATION_SPRAY | Freq: Every day | RESPIRATORY_TRACT | 3 refills | Status: DC

## 2022-09-05 MED ORDER — SPIRIVA RESPIMAT 2.5 MCG/ACT IN AERS
2.0000 | INHALATION_SPRAY | Freq: Every day | RESPIRATORY_TRACT | 3 refills | Status: AC
  Filled 2022-09-05 – 2022-11-03 (×2): qty 12, 90d supply, fill #0
  Filled 2023-02-22: qty 12, 90d supply, fill #1
  Filled 2023-05-19: qty 12, 90d supply, fill #2

## 2022-09-05 MED ORDER — FLUTICASONE-SALMETEROL 230-21 MCG/ACT IN AERO
2.0000 | INHALATION_SPRAY | Freq: Two times a day (BID) | RESPIRATORY_TRACT | 3 refills | Status: AC
  Filled 2022-09-05: qty 36, fill #0
  Filled 2022-10-11: qty 36, 90d supply, fill #0
  Filled 2023-01-21: qty 36, 90d supply, fill #1
  Filled 2023-04-21: qty 36, 90d supply, fill #2
  Filled 2023-07-18: qty 36, 90d supply, fill #3

## 2022-09-05 MED ORDER — ALBUTEROL SULFATE (2.5 MG/3ML) 0.083% IN NEBU: 2.5000 mg | INHALATION_SOLUTION | Freq: Four times a day (QID) | RESPIRATORY_TRACT | 5 refills | Status: AC | PRN

## 2022-09-05 NOTE — Progress Notes (Signed)
Subjective:   PATIENT ID: Sherri Gibson GENDER: female DOB: Mar 15, 1960, MRN: [id number]   HPI  Chief Complaint  Patient presents with   Follow-up    COPD   Reason for Visit: Follow-up  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 Kendrick Gibson patient, last seen September 2019.  She was initially diagnosed with COPD in 09/19/08 and has been on oxygen since 09-20-2015. She has been compliant with her Symbicort 160-4.5 mcg 2 puffs twice daily and Spiriva 2.5 mcg 2 puffs daily. She lives in a 3 story house and will need to wear her oxygen with it. She walks regularly on her treadmill. She is able to walk upstairs and around the house. Able to do yardwork. No limitations in activity except when needing to carry things due to her shoulder issues. She wears her oxygen overnight but unsure if she actually drops.   September 19, 2020 - Oct RSV treated for outpatient exacerbation. 12-21-22 ED treatment for COPD exacerbation September 19, 2021 - March Covid with increased O2 requirement. Improved O2 use in Oct. Mild exacerbation in Dec after Sherri Gibson 09-20-22 - COPD exacerbation by her PCP Dr. Ashley Gibson in March x 2, urgent care/ED on 07/10/22.   09/05/22 Since our last visit she reports she is overall doing well after moving to [place]. Compliant with Advair and Spiriva. Walking in her house. Not wearing oxygen with O2 sats  in 94%. Remains on oxygen nightly. Occasionally has croupy cough. Denies shortness of breath and wheezing. Able to perform household chores. Moved to a new home and less congested.  Social History: She smoked for many years, typically 2 packs per day from age [age].  She quit with Chantix in 09-19-2016. 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  Past Medical History:  Diagnosis Date   Anemia    as a child   Arthritis    Both Shoulders   Breast cancer (HCC) 09/20/14   Left    Complication of anesthesia    states she had a hard time staying awake after hand surgery   COPD (chronic obstructive pulmonary disease) (HCC)    uses O2 at 2l at night   High blood pressure    High cholesterol    History of kidney stones    Personal history of radiation therapy 09/20/14   Left Breast Cancer   Pneumonia    as a child   Pre-diabetes    Prediabetes    Wears dentures    upper dentures     Outpatient Medications Prior to Visit  Medication Sig Dispense Refill   albuterol (PROVENTIL) (2.5 MG/3ML) 0.083% nebulizer solution Take 3 mLs (2.5 mg total) by nebulization every 6 (six) hours as needed for wheezing or shortness of breath. 99 mL 1   albuterol (VENTOLIN HFA) 108 (90 Base) MCG/ACT inhaler Inhale 2 puffs into the lungs every 4 (four) hours as needed for wheezing or shortness of breath. 6.7 g 4   amLODipine (NORVASC) 10 MG tablet Take 1 tablet (10 mg total) by mouth daily. 90 tablet 1   aspirin EC 81 MG tablet Take 81 mg by mouth every evening. Swallow whole.     atorvastatin (LIPITOR) 40 MG tablet Take 1 tablet (40 mg total)  by mouth every evening. 90 tablet 1   cyclobenzaprine (FLEXERIL) 10 MG tablet Take 10 mg by mouth daily as needed for muscle spasms.     fluticasone-salmeterol (ADVAIR HFA) 230-21 MCG/ACT inhaler Inhale 2 puffs into the lungs 2 (two) times daily. 36 g 1   loratadine (CLARITIN) 10 MG tablet TAKE 1 TABLET BY MOUTH EVERY DAY 90 tablet 1   losartan (COZAAR) 50 MG tablet Take 1 tablet (50 mg total) by mouth daily. 90 tablet 1   metFORMIN (GLUCOPHAGE) 500 MG tablet Take 2 tablets (1,000 mg total) by mouth daily. 180 tablet 1   montelukast (SINGULAIR) 10 MG tablet Take 1 tablet (10 mg total) by mouth at bedtime. (Patient taking differently: Take 1,800 mg by mouth at bedtime.) 90 tablet 3   OXYGEN Inhale 2 L into the  lungs at bedtime.     pantoprazole (PROTONIX) 40 MG tablet Take 1 tablet (40 mg total) by mouth 2 (two) times daily. 180 tablet 5   Respiratory Therapy Supplies (NEBULIZER) DEVI As directed for Dx COPD 1 each 99   Suvorexant (BELSOMRA) 20 MG TABS Take 1 tablet by mouth at bedtime as needed. 30 tablet 4   Tiotropium Bromide Monohydrate (SPIRIVA RESPIMAT) 2.5 MCG/ACT AERS Inhale 2 puffs into the lungs daily. 12 g 2   amoxicillin-clavulanate (AUGMENTIN) 875-125 MG tablet Take 1 tablet by mouth 2 (two) times daily. 14 tablet 0   No facility-administered medications prior to visit.    Review of Systems  Constitutional:  Negative for chills, diaphoresis, fever, malaise/fatigue and weight loss.  HENT:  Negative for congestion.   Respiratory:  Positive for cough and sputum production. Negative for hemoptysis, shortness of breath and wheezing.   Cardiovascular:  Negative for chest pain, palpitations and leg swelling.    Objective:   Vitals:   09/05/22 0823  BP: 100/68  Pulse: 90  Resp: 18  SpO2: 99%  Weight: 115 lb 12.8 oz (52.5 kg)  Height: 4\' 11"  (1.499 m)    SpO2: 99 %  Physical Exam: General: Well-appearing, no acute distress HENT: [place], AT, OP clear, MMM Eyes: EOMI, no scleral icterus Respiratory: Diminished to auscultation bilaterally.  No crackles, wheezing or rales Cardiovascular: RRR, -M/R/G, no JVD Extremities:-Edema,-tenderness Neuro: AAO x4, CNII-XII grossly intact Skin: Intact, no rashes or bruising  Data Reviewed:  Imaging: 10/2015 CT chest [facility] [place] multiple calcified granulomatous largest is 8 mm, 2 noncalcified 3 mm left lower lobe nodules 11/2016 CT chest 6-21mm nodules Right upper an lower nodules (per patient this had been seen in [place] knew about this as well).   04/13/19 CT Lung Screen - Bilateral calcified granulomas bilaterally. Several noncalcified tiny nodules with largest 3mm, unchanged. Centrilobular emphysema 05/16/20 CT Lung Screen  -Unchanged bilateral small calcified and uncalcified nodules <47mm. Centrilobular emphysema 06/20/21 CT Lung Screen - Unchanged bilateral calcified and uncalcified nodules <38mm CT lung screen 06/22/22 - Overall stable lung nodules except for new RLL nodule ~69mm in size  PFT: 2017 PFT from [facility] [place] Ratio 53% FEV1 1.03 L 45% predicted, no air trapping, diffusion capacity 35% predicted  Ambulatory O2: 02/15/21 - No desaturations. SpO2 Nadir 90%    Assessment & Plan:   Discussion: 62 year old female with severe COPD and emphysema, subcentimeter nodules and stage I IDC of the left breast with recurrence s/p lumpectomy and radiation who presents for follow-up. Currently well controlled since she moved to [place] out of her old home. Compliant with O2 nightly. Compliant with HFA compared  to powder. Discussed clinical course and management of COPD/asthma including bronchodilator regimen, preventive care including vaccinations and action plan for exacerbation.  Severe COPD - well controlled CONTINUE Advair HFA 230-21 mcg TWO puff twice in the morning and evening. REFILL CONTINUE Spiriva 2.5 mcg 2 puffs daily CONTINUE Albuterol nebulizer 2-3 times a day as needed. Currently once daily Encourage regular aerobic activity   Exertional hypoxemia Nocturnal oxygen Wear oxygen nasal cannula for goal SpO2 >88% Continue 2L via O2 with activity  Continue 2L at night. Last ordered 07/27/22  Multiple lung nodules - overall stable. New RLL nodule 6mm Lung screen in 6 months (11/2022)    Health Maintenance Immunization History  Administered Date(s) Administered   Influenza, Seasonal, Injecte, Preservative Fre 10/15/2016   Influenza,inj,Quad PF,6+ Mos 10/15/2016, 09/23/2017, 09/16/2018, 10/14/2019, 10/04/2020   Influenza,inj,Quad PF,6-35 Mos 10/20/2021   Moderna Covid Bivalent Peds Booster(67mo Thru 51yrs) 10/14/2021   PFIZER(Purple Top)SARS-COV-2 Vaccination 04/14/2019, 05/05/2019, 12/05/2019    PNEUMOCOCCAL CONJUGATE-20 12/19/2020   Pneumococcal Polysaccharide-23 05/29/2017   Respiratory Syncytial Virus Vaccine,Recomb Aduvanted(Arexvy) 10/17/2021   Tdap 05/29/2017   Zoster Recombinant(Shingrix) 04/08/2020, 08/13/2020   CT Lung Screen - Due 04/2021  No orders of the defined types were placed in this encounter.  Meds ordered this encounter  Medications   albuterol (PROVENTIL) (2.5 MG/3ML) 0.083% nebulizer solution    Sig: Take 3 mLs (2.5 mg total) by nebulization every 6 (six) hours as needed for wheezing or shortness of breath.    Dispense:  99 mL    Refill:  5   fluticasone-salmeterol (ADVAIR HFA) 230-21 MCG/ACT inhaler    Sig: Inhale 2 puffs into the lungs 2 (two) times daily.    Dispense:  36 g    Refill:  3   DISCONTD: Tiotropium Bromide Monohydrate (SPIRIVA RESPIMAT) 2.5 MCG/ACT AERS    Sig: Inhale 2 puffs into the lungs daily.    Dispense:  12 g    Refill:  3   Tiotropium Bromide Monohydrate (SPIRIVA RESPIMAT) 2.5 MCG/ACT AERS    Sig: Inhale 2 puffs into the lungs daily.    Dispense:  12 g    Refill:  3   Return in about 6 months (around 03/08/2023).  I have spent a total time of 33-minutes on the day of the appointment including chart review, data review, collecting history, coordinating care and discussing medical diagnosis and plan with the patient/family. Past medical history, allergies, medications were reviewed. Pertinent imaging, labs and tests included in this note have been reviewed and interpreted independently by me.   Mechele Collin, MD [facility] 09/05/2022 8:41 AM

## 2022-09-05 NOTE — Patient Instructions (Signed)
Severe COPD - well controlled CONTINUE Advair HFA 230-21 mcg TWO puff twice in the morning and evening. REFILL CONTINUE Spiriva 2.5 mcg 2 puffs daily CONTINUE Albuterol nebulizer 2-3 times a day as needed. Currently once daily Encourage regular aerobic activity   Exertional hypoxemia Nocturnal oxygen Wear oxygen nasal cannula for goal SpO2 >88% Continue 2L via O2 with activity  Continue 2L at night. Last ordered 07/27/22  Multiple lung nodules - overall stable. New RLL nodule 6mm Lung screen in 6 months (11/2022)

## 2022-09-09 ENCOUNTER — Encounter (HOSPITAL_BASED_OUTPATIENT_CLINIC_OR_DEPARTMENT_OTHER): Payer: Self-pay | Admitting: Pulmonary Disease

## 2022-09-12 IMAGING — DX DG FOOT COMPLETE 3+V*L*
3 series · 3 of 3 positions shown · non-contrast
Comparison: None Available.

CLINICAL DATA: Slipped and fell 1.5 weeks ago twisting left foot
and ankle. Severe pain of the dorsal lateral midfoot, cuboid and
fifth metatarsal after inversion injury.

EXAM:
LEFT FOOT - COMPLETE 3+ VIEW; LEFT ANKLE COMPLETE - 3+ VIEW

[foot ap]
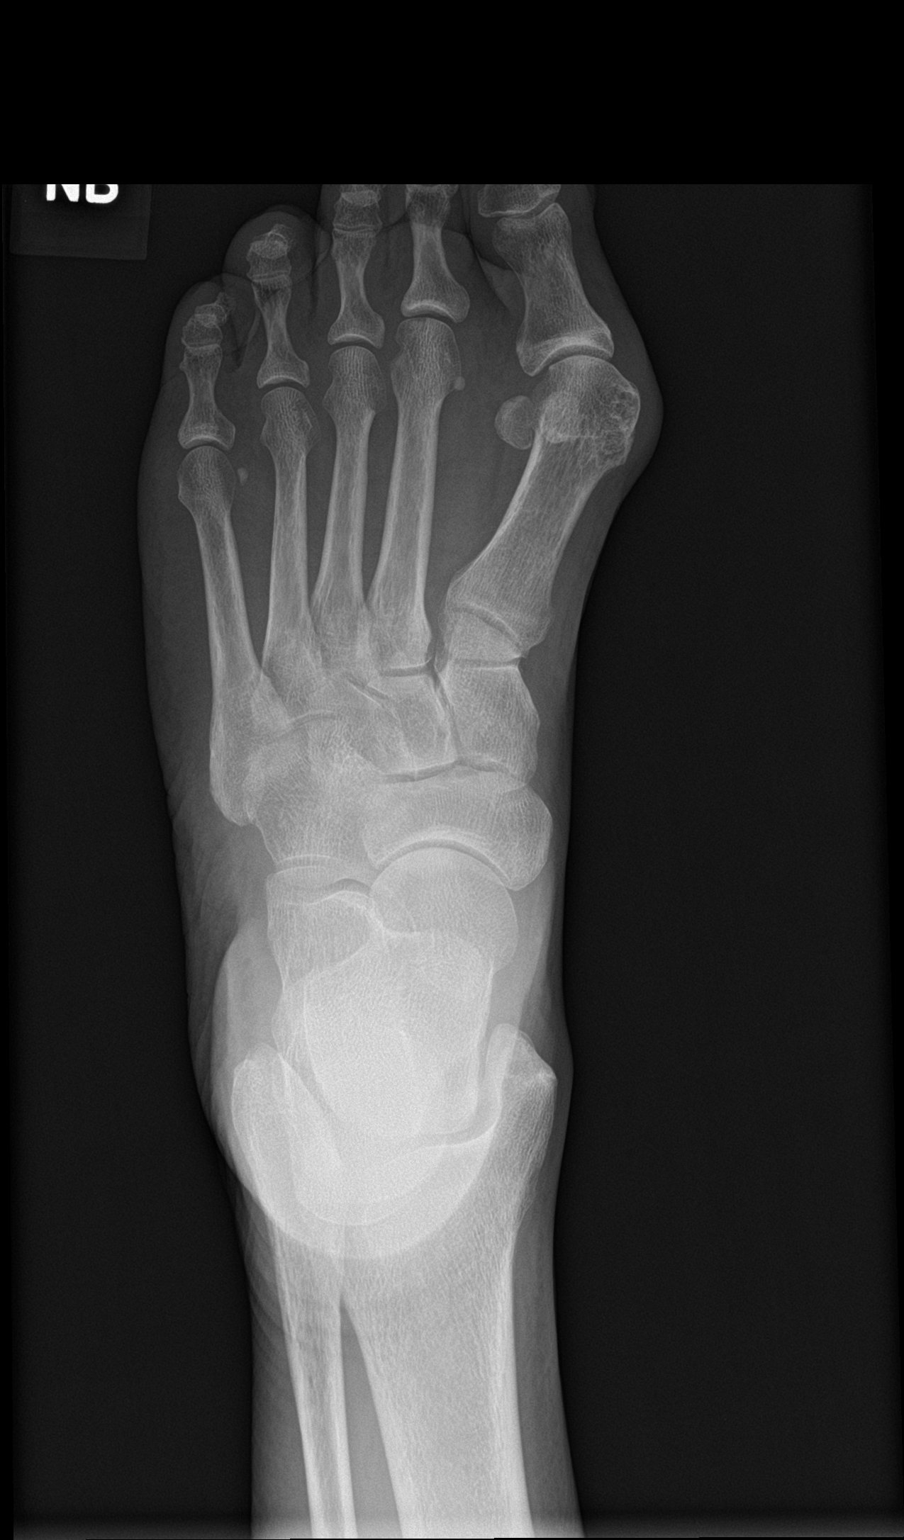

[foot obl]
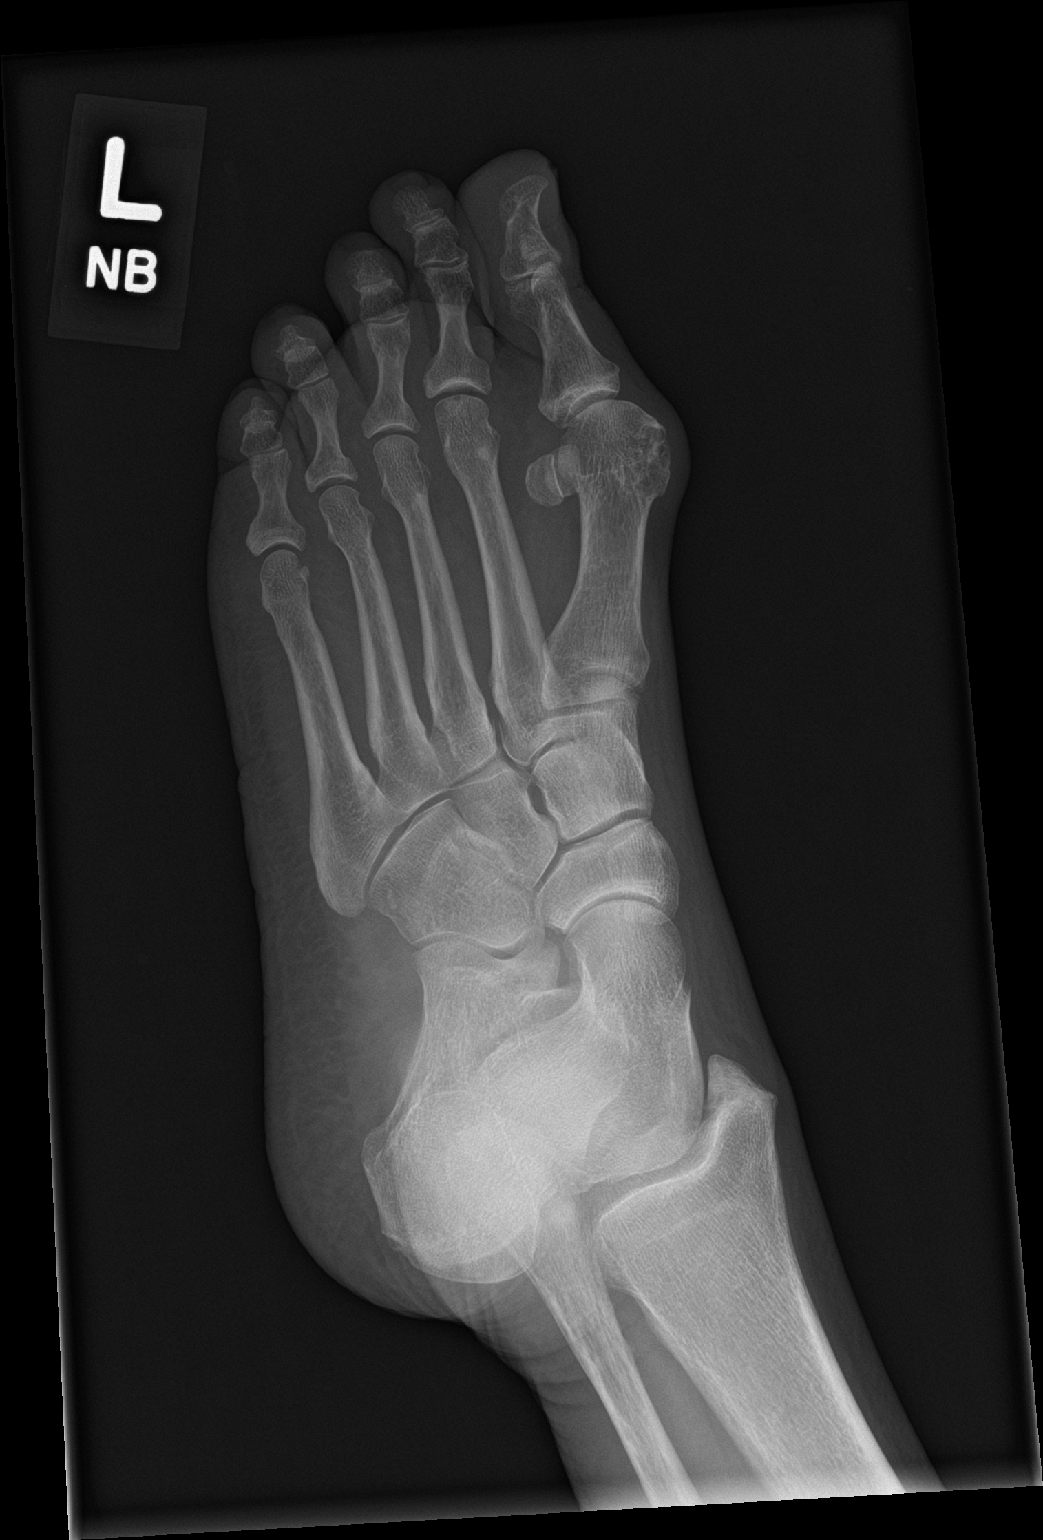

[foot lat]
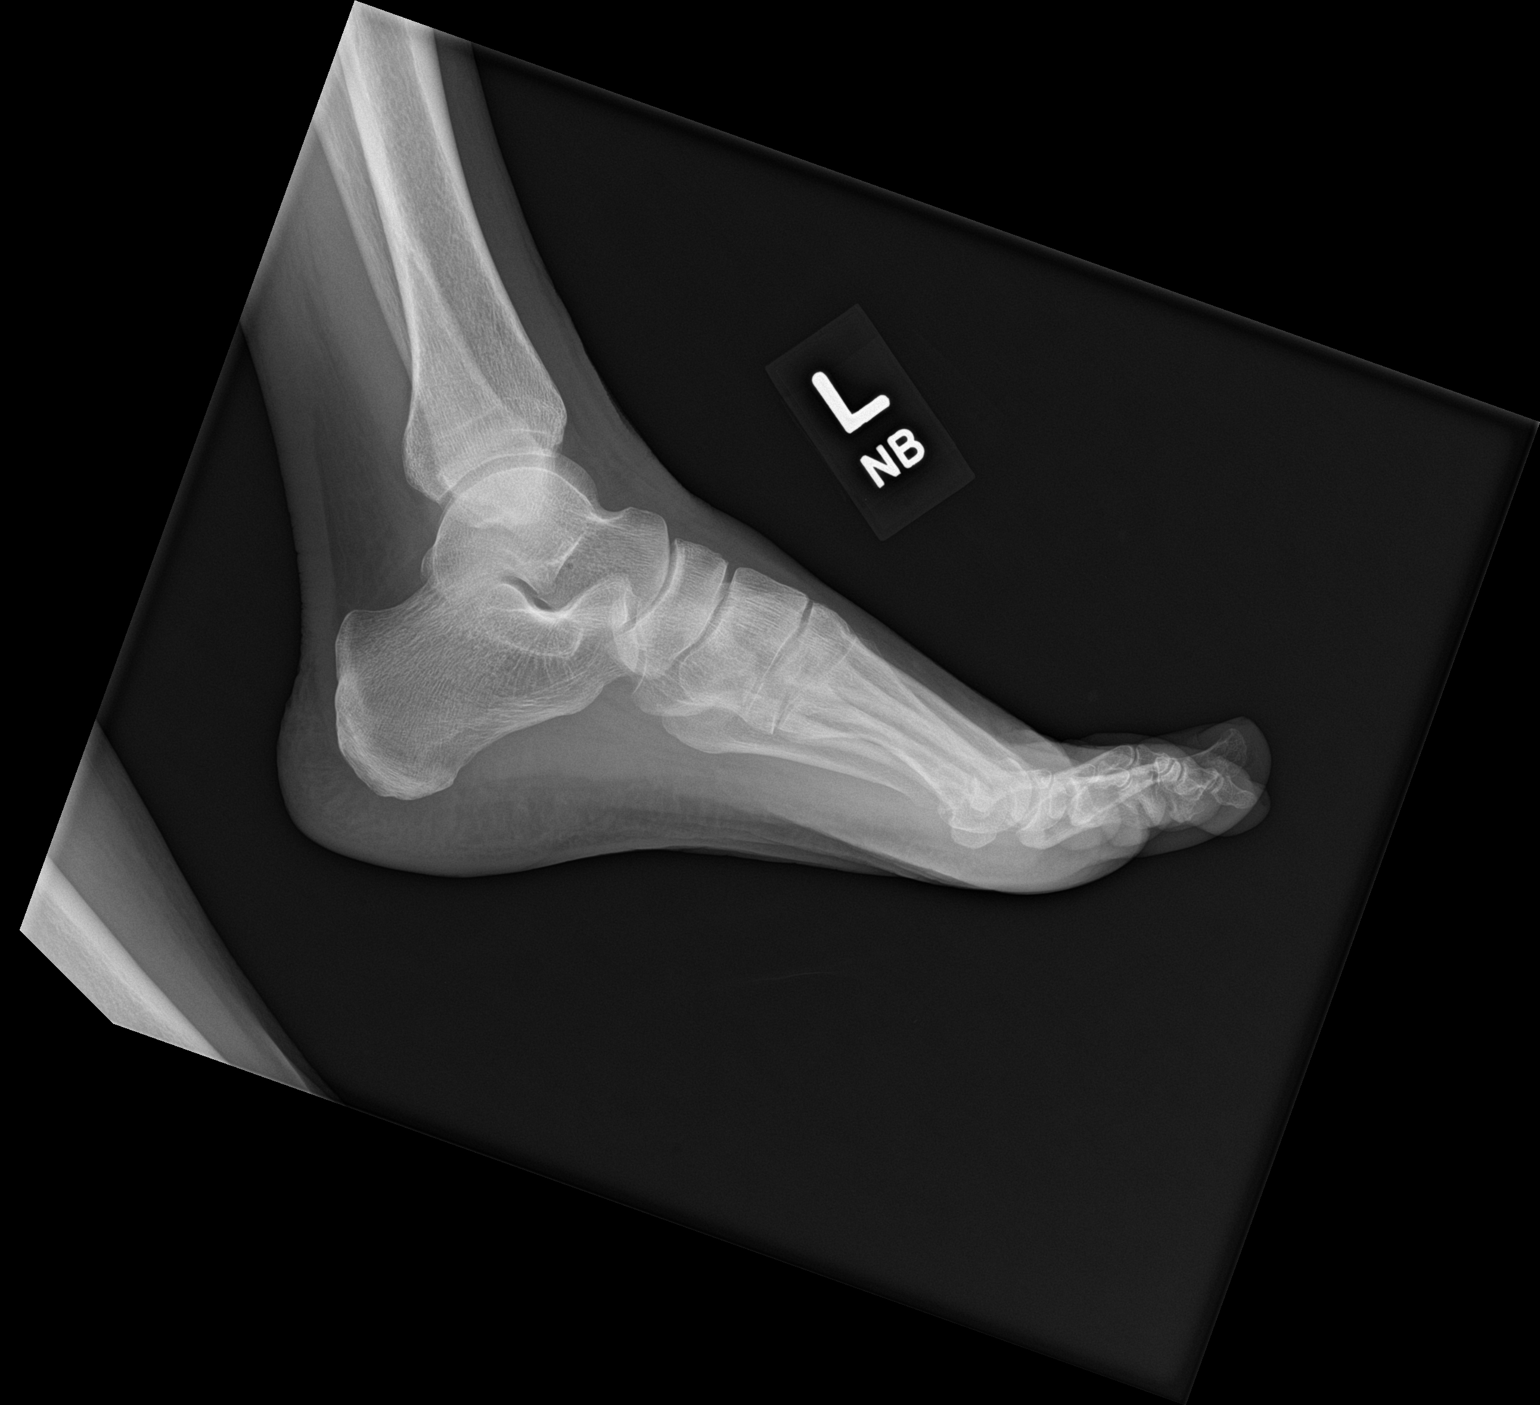

[3 of 3 positions shown; findings below may reference images not displayed]

FINDINGS: Left ankle:

The ankle mortise is symmetric and intact. The lateral view appears
mildly obliqued, however the lateral view of the contemporaneous
foot radiographs is more "straight on" at the tibiotalar joint which
appears symmetric and normal on that view. Joint spaces are
preserved. No acute fracture or dislocation.

Left foot:

Moderate hallux valgus. Mild great toe metatarsophalangeal joint
space narrowing with minimal degenerative peripheral spurring. No
acute fracture or dislocation.
IMPRESSION: 1. Moderate hallux valgus. Mild great toe metatarsophalangeal joint
osteoarthritis.
2. No acute fracture or dislocation.

## 2022-09-12 IMAGING — DX DG ANKLE COMPLETE 3+V*L*
3 series · 3 of 3 positions shown · non-contrast
Comparison: None Available.

CLINICAL DATA: Slipped and fell 1.5 weeks ago twisting left foot
and ankle. Severe pain of the dorsal lateral midfoot, cuboid and
fifth metatarsal after inversion injury.

EXAM:
LEFT FOOT - COMPLETE 3+ VIEW; LEFT ANKLE COMPLETE - 3+ VIEW

[ankle ap]
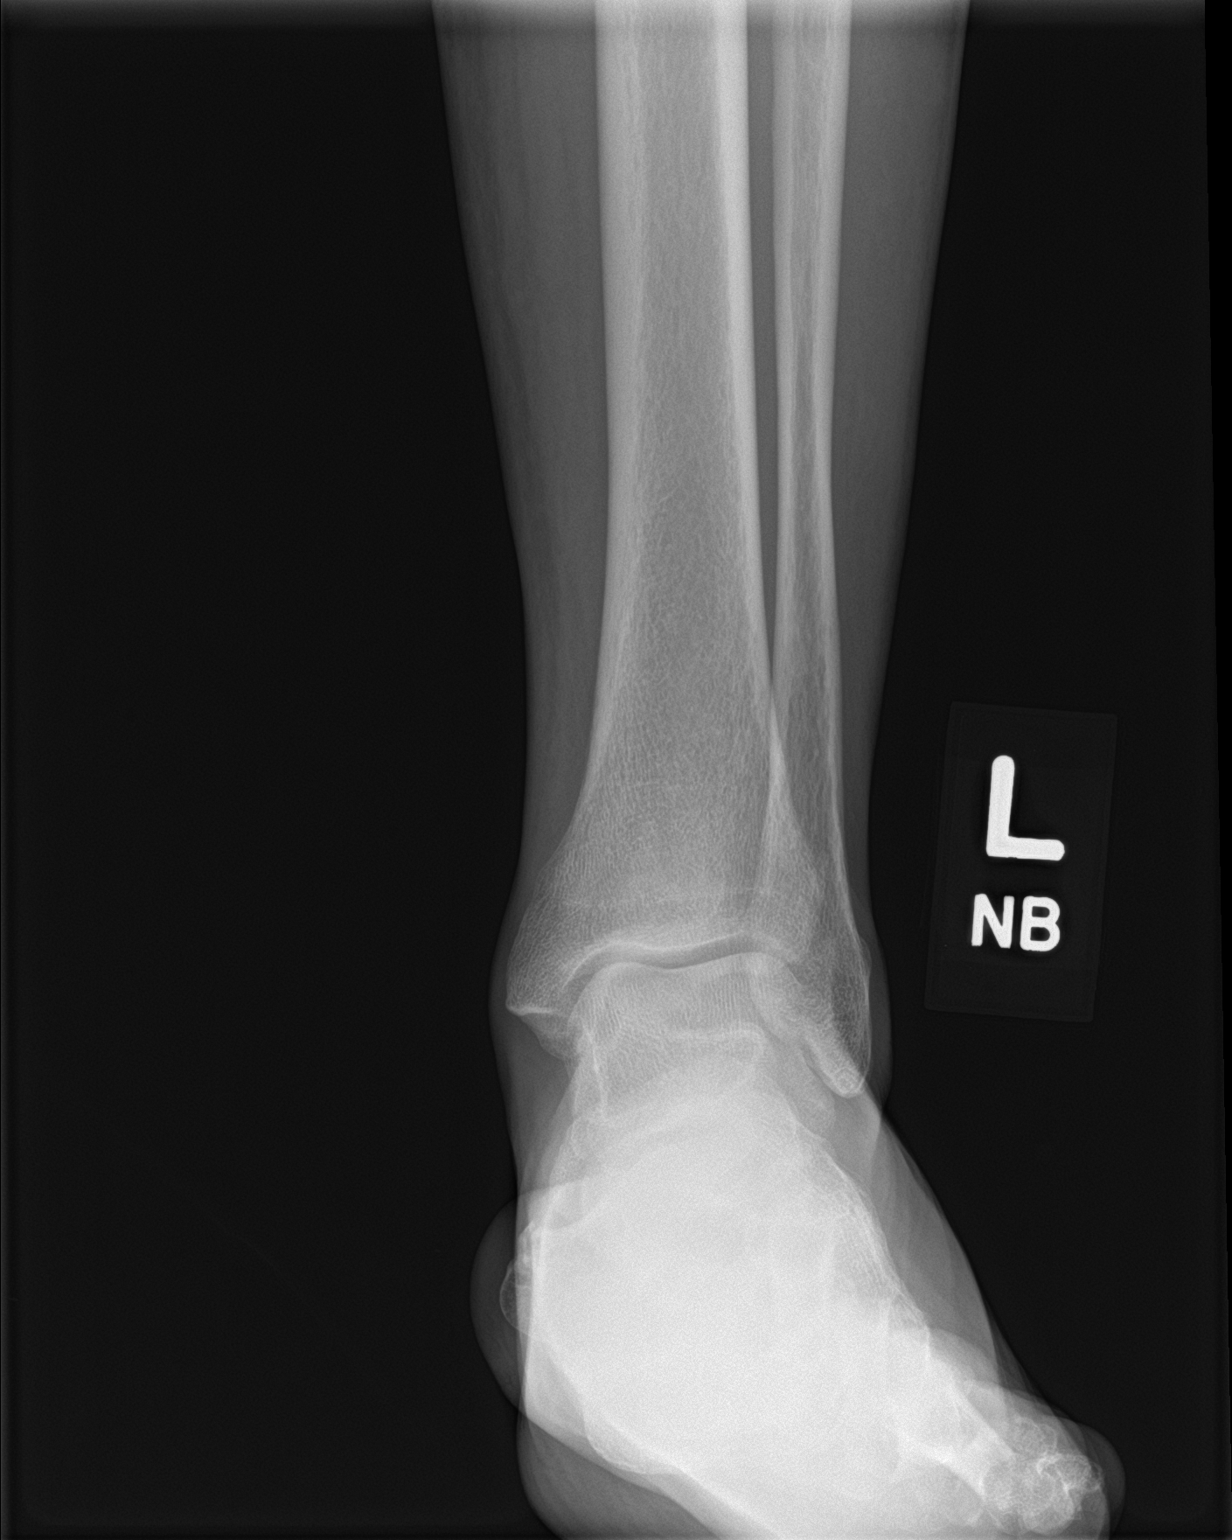

[ankle obl]
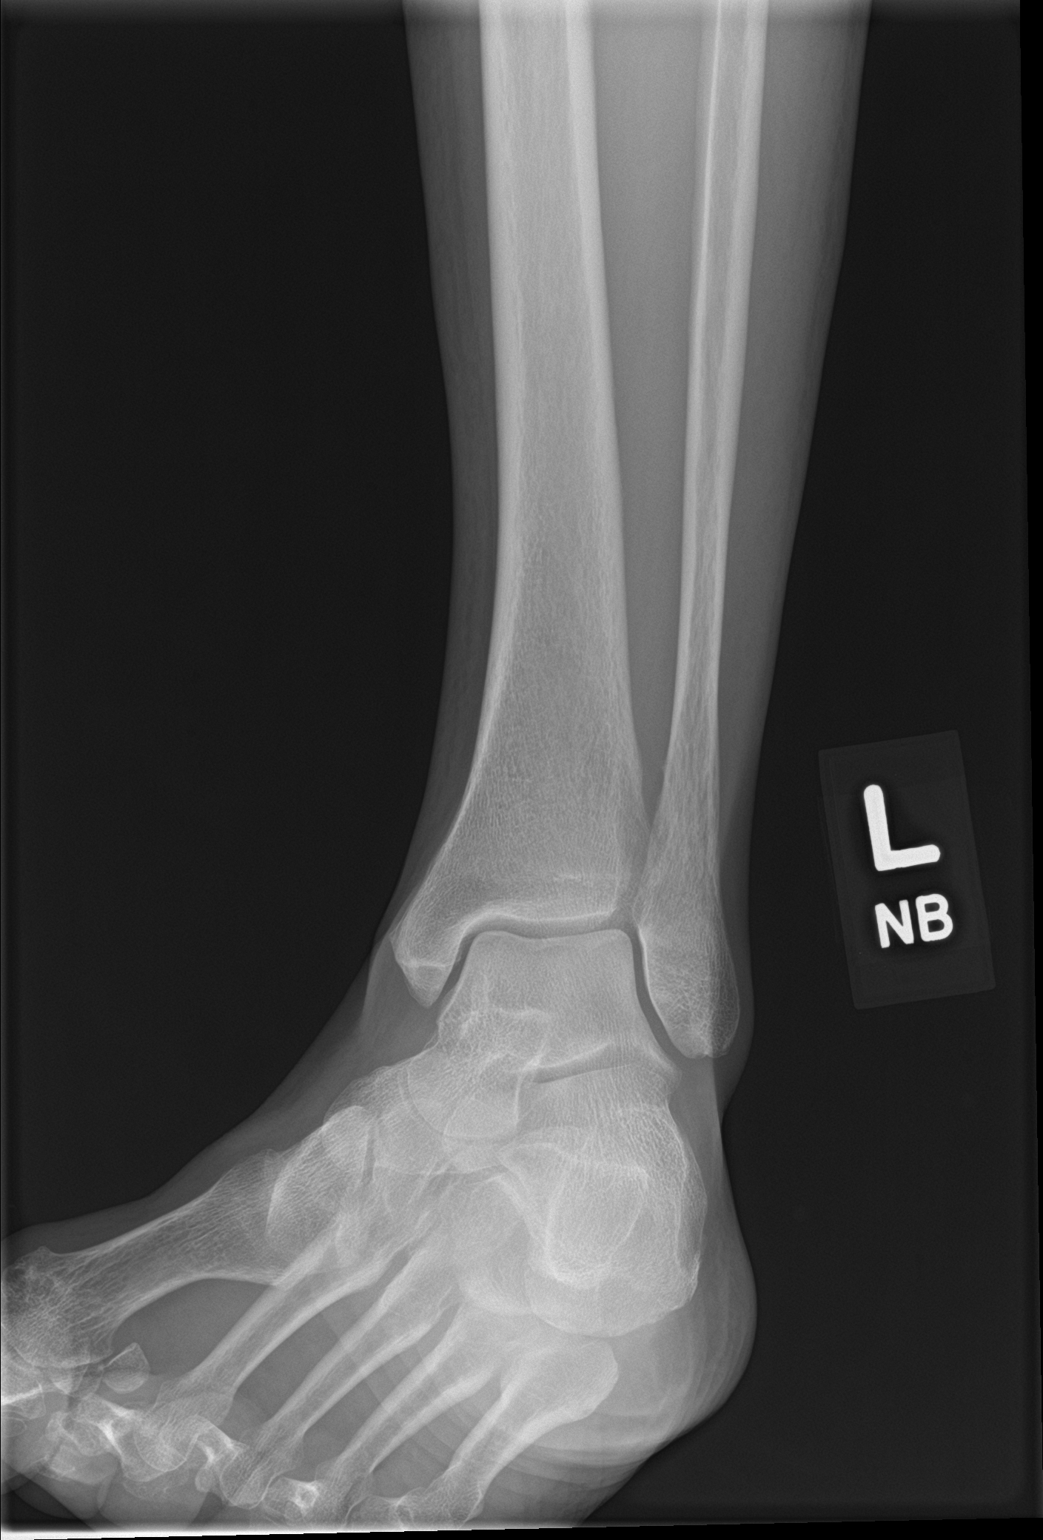

[ankle lat]
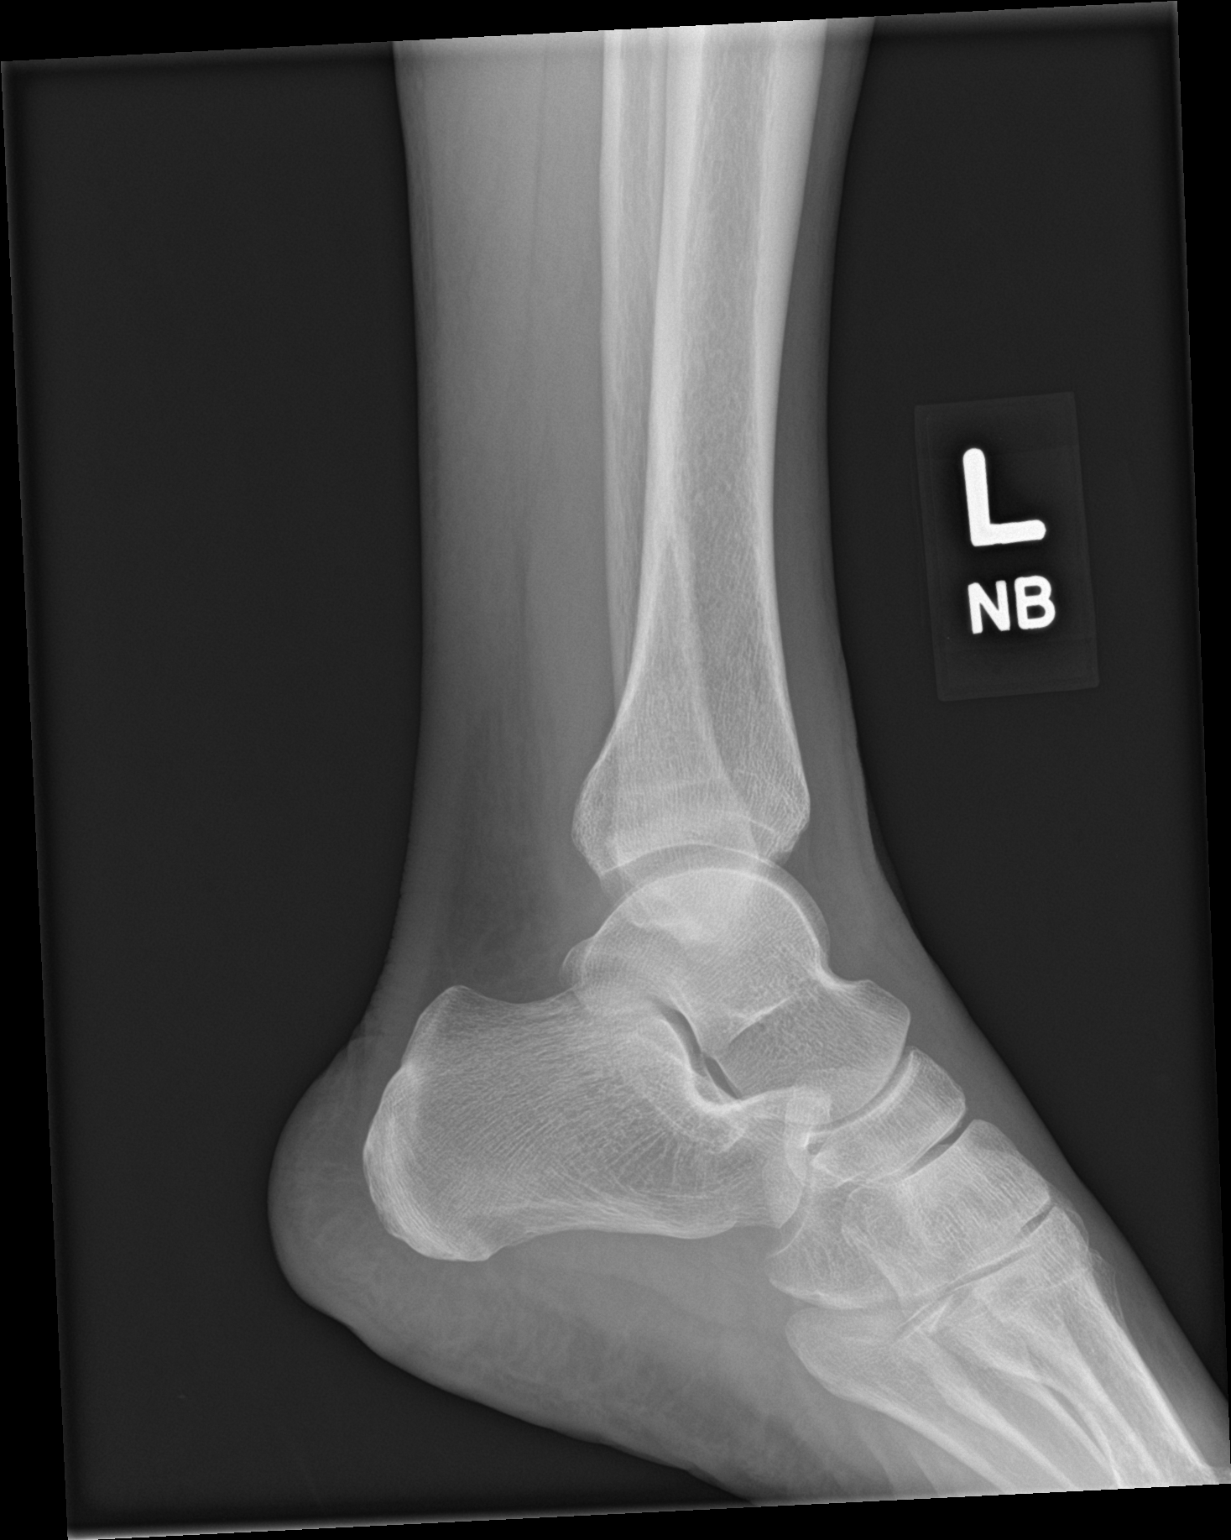

[3 of 3 positions shown; findings below may reference images not displayed]

FINDINGS: Left ankle:

The ankle mortise is symmetric and intact. The lateral view appears
mildly obliqued, however the lateral view of the contemporaneous
foot radiographs is more "straight on" at the tibiotalar joint which
appears symmetric and normal on that view. Joint spaces are
preserved. No acute fracture or dislocation.

Left foot:

Moderate hallux valgus. Mild great toe metatarsophalangeal joint
space narrowing with minimal degenerative peripheral spurring. No
acute fracture or dislocation.
IMPRESSION: 1. Moderate hallux valgus. Mild great toe metatarsophalangeal joint
osteoarthritis.
2. No acute fracture or dislocation.

## 2022-09-18 ENCOUNTER — Other Ambulatory Visit: Payer: Self-pay | Admitting: Family Medicine

## 2022-09-18 DIAGNOSIS — I10 Essential (primary) hypertension: Secondary | ICD-10-CM

## 2022-09-19 ENCOUNTER — Other Ambulatory Visit (HOSPITAL_COMMUNITY): Payer: Self-pay

## 2022-09-19 ENCOUNTER — Other Ambulatory Visit: Payer: Self-pay

## 2022-09-19 MED ORDER — AMLODIPINE BESYLATE 10 MG PO TABS
10.0000 mg | ORAL_TABLET | Freq: Every day | ORAL | 1 refills | Status: DC
Start: 2022-09-19 — End: 2023-03-12
  Filled 2022-09-19: qty 90, 90d supply, fill #0
  Filled 2022-12-17: qty 90, 90d supply, fill #1

## 2022-09-20 ENCOUNTER — Other Ambulatory Visit: Payer: Self-pay

## 2022-10-04 ENCOUNTER — Other Ambulatory Visit (HOSPITAL_COMMUNITY): Payer: Self-pay

## 2022-10-11 ENCOUNTER — Other Ambulatory Visit (HOSPITAL_COMMUNITY): Payer: Self-pay

## 2022-10-11 ENCOUNTER — Other Ambulatory Visit: Payer: Self-pay

## 2022-10-22 DIAGNOSIS — J440 Chronic obstructive pulmonary disease with acute lower respiratory infection: Secondary | ICD-10-CM | POA: Diagnosis not present

## 2022-10-22 DIAGNOSIS — Z20822 Contact with and (suspected) exposure to covid-19: Secondary | ICD-10-CM | POA: Diagnosis not present

## 2022-11-03 ENCOUNTER — Other Ambulatory Visit (HOSPITAL_COMMUNITY): Payer: Self-pay

## 2022-11-05 ENCOUNTER — Other Ambulatory Visit (HOSPITAL_COMMUNITY): Payer: Self-pay

## 2022-11-05 ENCOUNTER — Other Ambulatory Visit: Payer: Self-pay

## 2022-11-06 ENCOUNTER — Other Ambulatory Visit: Payer: Self-pay

## 2022-11-09 ENCOUNTER — Other Ambulatory Visit (HOSPITAL_COMMUNITY): Payer: Self-pay

## 2022-11-14 ENCOUNTER — Encounter: Payer: Self-pay | Admitting: Family Medicine

## 2022-11-14 ENCOUNTER — Other Ambulatory Visit: Payer: Self-pay

## 2022-11-14 MED ORDER — CYCLOBENZAPRINE HCL 10 MG PO TABS
10.0000 mg | ORAL_TABLET | Freq: Every day | ORAL | 1 refills | Status: AC | PRN
  Filled 2022-11-14: qty 90, 90d supply, fill #0
  Filled 2023-03-31: qty 90, 90d supply, fill #1

## 2022-11-25 DIAGNOSIS — R059 Cough, unspecified: Secondary | ICD-10-CM | POA: Diagnosis not present

## 2022-11-25 DIAGNOSIS — J441 Chronic obstructive pulmonary disease with (acute) exacerbation: Secondary | ICD-10-CM | POA: Diagnosis not present

## 2022-11-26 ENCOUNTER — Other Ambulatory Visit (HOSPITAL_COMMUNITY): Payer: Self-pay

## 2022-12-03 ENCOUNTER — Telehealth (INDEPENDENT_AMBULATORY_CARE_PROVIDER_SITE_OTHER): Payer: PPO | Admitting: Family Medicine

## 2022-12-03 ENCOUNTER — Encounter: Payer: Self-pay | Admitting: Family Medicine

## 2022-12-03 ENCOUNTER — Other Ambulatory Visit (HOSPITAL_COMMUNITY): Payer: Self-pay

## 2022-12-03 VITALS — Ht 59.0 in | Wt 115.8 lb

## 2022-12-03 DIAGNOSIS — J441 Chronic obstructive pulmonary disease with (acute) exacerbation: Secondary | ICD-10-CM | POA: Diagnosis not present

## 2022-12-03 DIAGNOSIS — Z87891 Personal history of nicotine dependence: Secondary | ICD-10-CM | POA: Diagnosis not present

## 2022-12-03 MED ORDER — AMOXICILLIN-POT CLAVULANATE 875-125 MG PO TABS: 1.0000 | ORAL_TABLET | Freq: Two times a day (BID) | ORAL | 0 refills | Status: DC

## 2022-12-03 MED ORDER — PREDNISONE 5 MG (48) PO TBPK: ORAL_TABLET | ORAL | 0 refills | Status: DC

## 2022-12-03 NOTE — Progress Notes (Signed)
Sherri Gibson - 62 y.o. female MRN [id number]  Date of birth: [birth year]   This visit type was conducted due to national recommendations for restrictions regarding the COVID-19 Pandemic (e.g. social distancing).  This format is felt to be most appropriate for this patient at this time.  All issues noted in this document were discussed and addressed.  No physical exam was performed (except for noted visual exam findings with Video Visits).  I discussed the limitations of evaluation and management by telemedicine and the availability of in person appointments. The patient expressed understanding and agreed to proceed.  I connected withNAME@ on 12/03/22 at  3:10 PM EST by a video enabled telemedicine application and verified that I am speaking with the correct person using two identifiers.  Present at visit: Everrett Coombe, DO Dorthula Matas   Patient Location: Home [address] [place] [address]   Provider location:   [facility]  Chief Complaint  Patient presents with   URI   Cough    HPI  Sherri Gibson is a 62 y.o. female who presents via audio/video conferencing for a telehealth visit today.  She reports having cough, congestion and dyspnea that started a couple of weeks ago.  She was seen at urgent care for COPD exacerbation.  Flu and COVID testing were negative.  Started on azithromycin and prednisone burst of 60mg  daily x5 days.  Rx for tessalon sent in as well.  W/o O2 her sats are 82%.  Normal sats with 2 L O2.  Augment with exacerbation in June seemed to work better.   ROS:  A comprehensive ROS was completed and negative except as noted per HPI  Past Medical History:  Diagnosis Date   Anemia    as a child   Arthritis    Both Shoulders   Breast cancer (HCC) 2016   Left    Complication of anesthesia    states she had a hard time staying awake after hand surgery   COPD (chronic obstructive pulmonary disease) (HCC)    uses O2 at 2l at night   High blood  pressure    High cholesterol    History of kidney stones    Personal history of radiation therapy 2016   Left Breast Cancer   Pneumonia    as a child   Pre-diabetes    Prediabetes    Wears dentures    upper dentures    Past Surgical History:  Procedure Laterality Date   BIOPSY  07/11/2020   Procedure: BIOPSY;  Surgeon: Shellia Cleverly, DO;  Location: [facility];  Service: Gastroenterology;;   BIOPSY  08/11/2020   Procedure: BIOPSY;  Surgeon: Rachael Fee, MD;  Location: [facility];  Service: Endoscopy;;   BREAST BIOPSY     BREAST LUMPECTOMY Left 2021   BREAST LUMPECTOMY WITH AXILLARY LYMPH NODE BIOPSY  2016   COLONOSCOPY  2017   [facility]. think there was colon polyps   COLONOSCOPY WITH PROPOFOL N/A 07/11/2020   Procedure: COLONOSCOPY WITH PROPOFOL;  Surgeon: Shellia Cleverly, DO;  Location: [facility];  Service: Gastroenterology;  Laterality: N/A;   ESOPHAGOGASTRODUODENOSCOPY N/A 06/14/2022   Procedure: ESOPHAGOGASTRODUODENOSCOPY (EGD);  Surgeon: Lemar Lofty., MD;  Location: [facility];  Service: Gastroenterology;  Laterality: N/A;   ESOPHAGOGASTRODUODENOSCOPY (EGD) WITH PROPOFOL N/A 07/11/2020   Procedure: ESOPHAGOGASTRODUODENOSCOPY (EGD) WITH PROPOFOL;  Surgeon: Shellia Cleverly, DO;  Location: [facility];  Service: Gastroenterology;  Laterality: N/A;   ESOPHAGOGASTRODUODENOSCOPY (  EGD) WITH PROPOFOL N/A 08/11/2020   Procedure: ESOPHAGOGASTRODUODENOSCOPY (EGD) WITH PROPOFOL;  Surgeon: Rachael Fee, MD;  Location: [facility];  Service: Endoscopy;  Laterality: N/A;   EUS N/A 08/11/2020   Procedure: UPPER ENDOSCOPIC ULTRASOUND (EUS) RADIAL;  Surgeon: Rachael Fee, MD;  Location: [facility];  Service: Endoscopy;  Laterality: N/A;   EUS N/A 06/14/2022   Procedure: UPPER ENDOSCOPIC ULTRASOUND (EUS) RADIAL;  Surgeon: Lemar Lofty., MD;  Location: [facility];  Service: Gastroenterology;  Laterality:  N/A;   HAND SURGERY Left    PARTIAL HYSTERECTOMY     POLYPECTOMY  06/14/2022   Procedure: POLYPECTOMY;  Surgeon: Meridee Score Netty Starring., MD;  Location: [facility];  Service: Gastroenterology;;   Gaspar Bidding DILATION N/A 07/11/2020   Procedure: Gaspar Bidding DILATION;  Surgeon: Shellia Cleverly, DO;  Location: [facility];  Service: Gastroenterology;  Laterality: N/A;   TOTAL SHOULDER ARTHROPLASTY Left 04/12/2020   Procedure: LEFT REVERSE TOTAL SHOULDER ARTHROPLASTY;  Surgeon: Cammy Copa, MD;  Location: [facility] OR;  Service: Orthopedics;  Laterality: Left;   TOTAL SHOULDER ARTHROPLASTY Right 09/13/2020   Procedure: RIGHT TOTAL SHOULDER ARTHROPLASTY;  Surgeon: Cammy Copa, MD;  Location: [facility] OR;  Service: Orthopedics;  Laterality: Right;    Family History  Problem Relation Age of Onset   Breast cancer Mother    Lung cancer Father        smoker   Diabetes Brother    Colon cancer Neg Hx    Esophageal cancer Neg Hx     Social History   Socioeconomic History   Marital status: Widowed    Spouse name: Not on file   Number of children: 0   Years of education: Not on file   Highest education level: GED or equivalent  Occupational History   Not on file  Tobacco Use   Smoking status: Former    Current packs/day: 0.00    Average packs/day: 2.5 packs/day for 44.0 years (110.0 ttl pk-yrs)    Types: Cigarettes    Start date: 11/29/1972    Quit date: 11/29/2016    Years since quitting: 6.0   Smokeless tobacco: Never  Vaping Use   Vaping status: Never Used  Substance and Sexual Activity   Alcohol use: Not Currently   Drug use: Never   Sexual activity: Yes    Partners: Male    Birth control/protection: None  Other Topics Concern   Not on file  Social History Narrative   Not on file   Social Determinants of Health   Financial Resource Strain: Low Risk  (04/25/2022)   Overall Financial Resource Strain (CARDIA)    Difficulty of Paying Living Expenses: Not hard at all  Food  Insecurity: Unknown (07/10/2022)   Received from [facility]   Hunger Vital Sign    Worried About Running Out of Food in the Last Year: Never true    Ran Out of Food in the Last Year: Not on file  Transportation Needs: Unknown (07/10/2022)   Received from [employer]   [employer] - Transportation    Lack of Transportation (Medical): No    Lack of Transportation (Non-Medical): Not on file  Physical Activity: Unknown (04/25/2022)   Exercise Vital Sign    Days of Exercise per Week: 0 days    Minutes of Exercise per Session: Not on file  Stress: Stress Concern Present (04/25/2022)   [employer] [facility] [employer] - Occupational Stress Questionnaire    Feeling of  Stress : Very much  Social Connections: Unknown (11/09/2022)   Received from [facility]   Social Network    Social Network: Not on file  Intimate Partner Violence: Unknown (11/09/2022)   Received from [facility]   HITS    Physically Hurt: Not on file    Insult or Talk Down To: Not on file    Threaten Physical Harm: Not on file    Scream or Curse: Not on file     Current Outpatient Medications:    albuterol (PROVENTIL) (2.5 MG/3ML) 0.083% nebulizer solution, Take 3 mLs (2.5 mg total) by nebulization every 6 (six) hours as needed for wheezing or shortness of breath., Disp: 99 mL, Rfl: 5   albuterol (VENTOLIN HFA) 108 (90 Base) MCG/ACT inhaler, Inhale 2 puffs into the lungs every 4 (four) hours as needed for wheezing or shortness of breath., Disp: 6.7 g, Rfl: 4   amLODipine (NORVASC) 10 MG tablet, Take 1 tablet (10 mg total) by mouth daily., Disp: 90 tablet, Rfl: 1   amoxicillin-clavulanate (AUGMENTIN) 875-125 MG tablet, Take 1 tablet by mouth 2 (two) times daily., Disp: 20 tablet, Rfl: 0   aspirin EC 81 MG tablet, Take 81 mg by mouth every evening. Swallow whole., Disp: , Rfl:    atorvastatin (LIPITOR) 40 MG tablet, Take 1 tablet (40 mg total) by  mouth every evening., Disp: 90 tablet, Rfl: 1   azithromycin (ZITHROMAX) 250 MG tablet, Take 250 mg by mouth daily., Disp: , Rfl:    benzonatate (TESSALON) 200 MG capsule, Take 200 mg by mouth 2 (two) times daily as needed for cough., Disp: , Rfl:    cyclobenzaprine (FLEXERIL) 10 MG tablet, Take 1 tablet (10 mg total) by mouth daily as needed for muscle spasms., Disp: 90 tablet, Rfl: 1   fluticasone-salmeterol (ADVAIR HFA) 230-21 MCG/ACT inhaler, Inhale 2 puffs into the lungs 2 (two) times daily., Disp: 36 g, Rfl: 3   loratadine (CLARITIN) 10 MG tablet, TAKE 1 TABLET BY MOUTH EVERY DAY, Disp: 90 tablet, Rfl: 1   losartan (COZAAR) 50 MG tablet, Take 1 tablet (50 mg total) by mouth daily., Disp: 90 tablet, Rfl: 1   metFORMIN (GLUCOPHAGE) 500 MG tablet, Take 2 tablets (1,000 mg total) by mouth daily., Disp: 180 tablet, Rfl: 1   montelukast (SINGULAIR) 10 MG tablet, Take 1 tablet (10 mg total) by mouth at bedtime. (Patient taking differently: Take 1,800 mg by mouth at bedtime.), Disp: 90 tablet, Rfl: 3   OXYGEN, Inhale 2 L into the lungs at bedtime., Disp: , Rfl:    pantoprazole (PROTONIX) 40 MG tablet, Take 1 tablet (40 mg total) by mouth 2 (two) times daily., Disp: 180 tablet, Rfl: 5   predniSONE (DELTASONE) 20 MG tablet, Take 60 mg by mouth daily., Disp: , Rfl:    predniSONE (STERAPRED UNI-PAK 48 TAB) 5 MG (48) TBPK tablet, Taper as directed on packaging., Disp: 48 tablet, Rfl: 0   Respiratory Therapy Supplies (NEBULIZER) DEVI, As directed for Dx COPD, Disp: 1 each, Rfl: 99   Suvorexant (BELSOMRA) 20 MG TABS, Take 1 tablet by mouth at bedtime as needed., Disp: 30 tablet, Rfl: 4   Tiotropium Bromide Monohydrate (SPIRIVA RESPIMAT) 2.5 MCG/ACT AERS, Inhale 2 puffs into the lungs daily., Disp: 12 g, Rfl: 3  EXAM:  VITALS per patient if applicable: Ht 4\' 11"  (1.499 m)   Wt 115 lb 12.8 oz (52.5 kg)   SpO2 (!) 84%   BMI 23.39 kg/m   GENERAL: alert, oriented, appears well and  in no acute  distress  HEENT: atraumatic, conjunttiva clear, no obvious abnormalities on inspection of external nose and ears  NECK: normal movements of the head and neck  LUNGS: on inspection no signs of respiratory distress, breathing rate appears normal, no obvious gross SOB, gasping or wheezing  CV: no obvious cyanosis  MS: moves all visible extremities without noticeable abnormality  PSYCH/NEURO: pleasant and cooperative, no obvious depression or anxiety, speech and thought processing grossly intact  ASSESSMENT AND PLAN:  Discussed the following assessment and plan:  COPD with exacerbation (HCC) Adding augmentin and prednisone taper.  Augmentin seemed to be more effective for her previously.   Continue O2 prn to maintain sats >92%.  Contact clinic or seek emergency care for worsening symptoms.       I discussed the assessment and treatment plan with the patient. The patient was provided an opportunity to ask questions and all were answered. The patient agreed with the plan and demonstrated an understanding of the instructions.   The patient was advised to call back or seek an in-person evaluation if the symptoms worsen or if the condition fails to improve as anticipated.    Everrett Coombe, DO

## 2022-12-03 NOTE — Progress Notes (Signed)
Sx's started last week. Seen at Urgent Care. Took low dose steroid. Not helping.

## 2022-12-03 NOTE — Assessment & Plan Note (Signed)
Adding augmentin and prednisone taper.  Augmentin seemed to be more effective for her previously.   Continue O2 prn to maintain sats >92%.  Contact clinic or seek emergency care for worsening symptoms.

## 2022-12-11 ENCOUNTER — Other Ambulatory Visit: Payer: Self-pay | Admitting: Family Medicine

## 2022-12-12 ENCOUNTER — Other Ambulatory Visit (HOSPITAL_COMMUNITY): Payer: Self-pay

## 2022-12-12 ENCOUNTER — Other Ambulatory Visit: Payer: Self-pay

## 2022-12-12 DIAGNOSIS — F5101 Primary insomnia: Secondary | ICD-10-CM

## 2022-12-12 MED ORDER — BELSOMRA 20 MG PO TABS
1.0000 | ORAL_TABLET | Freq: Every evening | ORAL | 2 refills | Status: DC | PRN
  Filled 2022-12-12: qty 30, 30d supply, fill #0
  Filled 2023-01-13: qty 30, 30d supply, fill #1
  Filled 2023-02-20: qty 30, 30d supply, fill #2

## 2022-12-12 MED ORDER — BELSOMRA 20 MG PO TABS: 1.0000 | ORAL_TABLET | Freq: Every evening | ORAL | 2 refills | Status: DC | PRN

## 2022-12-13 ENCOUNTER — Other Ambulatory Visit: Payer: Self-pay

## 2022-12-14 ENCOUNTER — Other Ambulatory Visit (HOSPITAL_COMMUNITY): Payer: Self-pay

## 2022-12-14 ENCOUNTER — Other Ambulatory Visit: Payer: Self-pay | Admitting: Family Medicine

## 2022-12-14 ENCOUNTER — Other Ambulatory Visit: Payer: Self-pay

## 2022-12-14 DIAGNOSIS — I10 Essential (primary) hypertension: Secondary | ICD-10-CM

## 2022-12-14 MED ORDER — LOSARTAN POTASSIUM 50 MG PO TABS
50.0000 mg | ORAL_TABLET | Freq: Every day | ORAL | 1 refills | Status: DC
  Filled 2022-12-14: qty 90, 90d supply, fill #0
  Filled 2023-03-14: qty 90, 90d supply, fill #1

## 2022-12-17 ENCOUNTER — Other Ambulatory Visit: Payer: Self-pay

## 2022-12-31 ENCOUNTER — Ambulatory Visit: Payer: PPO

## 2022-12-31 DIAGNOSIS — Z122 Encounter for screening for malignant neoplasm of respiratory organs: Secondary | ICD-10-CM | POA: Diagnosis not present

## 2022-12-31 DIAGNOSIS — Z87891 Personal history of nicotine dependence: Secondary | ICD-10-CM

## 2023-01-04 ENCOUNTER — Encounter (HOSPITAL_BASED_OUTPATIENT_CLINIC_OR_DEPARTMENT_OTHER): Payer: Self-pay | Admitting: Pulmonary Disease

## 2023-01-04 ENCOUNTER — Telehealth: Payer: PPO | Admitting: Pulmonary Disease

## 2023-01-04 ENCOUNTER — Encounter: Payer: Self-pay | Admitting: Pulmonary Disease

## 2023-01-04 DIAGNOSIS — J471 Bronchiectasis with (acute) exacerbation: Secondary | ICD-10-CM | POA: Diagnosis not present

## 2023-01-04 DIAGNOSIS — J441 Chronic obstructive pulmonary disease with (acute) exacerbation: Secondary | ICD-10-CM | POA: Diagnosis not present

## 2023-01-04 MED ORDER — PREDNISONE 10 MG PO TABS: ORAL_TABLET | ORAL | 0 refills | Status: AC

## 2023-01-04 MED ORDER — SODIUM CHLORIDE 3 % IN NEBU: INHALATION_SOLUTION | Freq: Two times a day (BID) | RESPIRATORY_TRACT | 12 refills | Status: AC

## 2023-01-04 MED ORDER — LEVOFLOXACIN 750 MG PO TABS: 750.0000 mg | ORAL_TABLET | Freq: Every day | ORAL | 0 refills | Status: AC

## 2023-01-04 NOTE — Progress Notes (Signed)
Virtual Visit via Video Note  I connected with Sherri Gibson on 01/04/23 at  1:00 PM EST by a video enabled telemedicine application and verified that I am speaking with the correct person using two identifiers.  Location: Patient: home Provider: clinic   I discussed the limitations of evaluation and management by telemedicine and the availability of in person appointments. The patient expressed understanding and agreed to proceed.  History of Present Illness: Sherri Gibson is a 62 year old woman, former smoker with chronic hypoxemic respiratory failure due to COPD/Emphysema and pulmonary nodules who returns to clinic for acute visit.   She was treated for COPD exacerbation by her primary team on 11/4 with prednisone and augmentin. She was treated for COPD exacerbation 11/25/22 at an Urgent care with prednisone and azithromycin.   She has multiple exacerbations over the past 1-2 years. Absolute eos 226 on 02/01/22. CT Chest scan 12/31/22 shows thickened and dilated airways consistent with chronic bronchitis vs bronchiectasis.   She is using advair HFA 230-79mcg 2 puffs twice daily and spiriva 2.72mcg 2 puffs daily. She is using PRN albuterol.   The patient, with a history of chronic lung disease, presents with worsening respiratory distress over the past six weeks. She describes a sensation of 'something sitting on my chest,' and intermittent coughing fits that leave her breathless and feeling as though she is choking on phlegm. She has been managing her symptoms with cough syrup, Tessalon Perles, Mucinex, Tylenol, and a nebulizer, but her oxygen levels have been fluctuating and she has been requiring supplemental oxygen more frequently. She has also been drinking more water in an attempt to alleviate her symptoms.  Six weeks ago, she sought care at an urgent care center, where she was tested for flu and COVID-19 (both negative) and was prescribed a steroid and an antibiotic. However, her  symptoms did not fully resolve. She recalls a similar episode in the summer, during which her primary care provider prescribed Augmentin and prednisone, which effectively resolved her symptoms and allowed her to resume her normal activities. She expresses a desire to feel better in time for a planned outing with her nephews next weekend.  In addition to her respiratory symptoms, the patient also reports a distressing episode of urinary incontinence that occurred when she was exerting herself outdoors and her oxygen levels dropped. She has never experienced this before and it caused her significant stress.   Observations/Objective: Productive sounding cough during interview Nasal canula in place No distress noted  Assessment and Plan: COPD with Acute Exacerbation Bronchiectasis vs Chronic Bronchitis Chronic Hypoxemic Respiratory Failure  Plan: - Extended prednisone taper - Levaquin 750mg  for 14 days - Add hypertonic saline nebs twice daily - Continue advair HFA 230-46mcg 2 puffs twice daily and spiriva 2.37mcg 2 puffs daily - Continue PRN albuterol nebs or inhaler - Consider adding daily or every other day azithromycin dosing for COPD with frequent exacerbations - Consider adding ensifentrine nebs in the future if remains uncontrolled  Follow Up Instructions: Follow up with Dr. Everardo All as scheduled   I discussed the assessment and treatment plan with the patient. The patient was provided an opportunity to ask questions and all were answered. The patient agreed with the plan and demonstrated an understanding of the instructions.   The patient was advised to call back or seek an in-person evaluation if the symptoms worsen or if the condition fails to improve as anticipated.  I provided 30 minutes of non-face-to-face time during this encounter.  [name], MD

## 2023-01-04 NOTE — Patient Instructions (Addendum)
Continue advair HFA 230-46mcg 2 puffs twice daily and spiriva 2.76mcg 2 puffs daily  Add hypertonic saline nebs twice daily  Levaquin 750mg  for 14 days  Extended prednisone taper  Follow up with Dr. Everardo All as scheduled

## 2023-01-07 DIAGNOSIS — J449 Chronic obstructive pulmonary disease, unspecified: Secondary | ICD-10-CM | POA: Diagnosis not present

## 2023-01-10 DIAGNOSIS — J471 Bronchiectasis with (acute) exacerbation: Secondary | ICD-10-CM | POA: Diagnosis not present

## 2023-01-14 ENCOUNTER — Other Ambulatory Visit: Payer: Self-pay

## 2023-01-14 ENCOUNTER — Other Ambulatory Visit (HOSPITAL_COMMUNITY): Payer: Self-pay

## 2023-01-15 ENCOUNTER — Other Ambulatory Visit: Payer: Self-pay

## 2023-01-16 ENCOUNTER — Other Ambulatory Visit (HOSPITAL_BASED_OUTPATIENT_CLINIC_OR_DEPARTMENT_OTHER): Payer: Self-pay | Admitting: Pulmonary Disease

## 2023-01-16 ENCOUNTER — Other Ambulatory Visit: Payer: Self-pay

## 2023-01-16 DIAGNOSIS — Z87891 Personal history of nicotine dependence: Secondary | ICD-10-CM

## 2023-01-16 DIAGNOSIS — Z122 Encounter for screening for malignant neoplasm of respiratory organs: Secondary | ICD-10-CM

## 2023-01-17 ENCOUNTER — Telehealth: Payer: Self-pay | Admitting: Pulmonary Disease

## 2023-01-17 NOTE — Telephone Encounter (Signed)
Patient's ONO shows 1hr 28 minutes with SpO2 88% or less on room air. She should continue to wear supplemental oxygen at night.

## 2023-01-17 NOTE — Telephone Encounter (Signed)
Mychart message sent for patient to continue oxygen nightly.   Last ONO in December 2024. Not due for recertification (06/2023)

## 2023-01-18 ENCOUNTER — Other Ambulatory Visit: Payer: Self-pay

## 2023-01-18 MED ORDER — ALBUTEROL SULFATE HFA 108 (90 BASE) MCG/ACT IN AERS
2.0000 | INHALATION_SPRAY | RESPIRATORY_TRACT | 4 refills | Status: DC | PRN
  Filled 2023-01-18: qty 6.7, 17d supply, fill #0

## 2023-01-21 ENCOUNTER — Other Ambulatory Visit (HOSPITAL_COMMUNITY): Payer: Self-pay

## 2023-01-21 ENCOUNTER — Other Ambulatory Visit: Payer: Self-pay

## 2023-01-21 ENCOUNTER — Encounter (HOSPITAL_BASED_OUTPATIENT_CLINIC_OR_DEPARTMENT_OTHER): Payer: Self-pay | Admitting: Pulmonary Disease

## 2023-01-21 MED ORDER — ALBUTEROL SULFATE HFA 108 (90 BASE) MCG/ACT IN AERS
2.0000 | INHALATION_SPRAY | RESPIRATORY_TRACT | 3 refills | Status: AC | PRN
  Filled 2023-01-21: qty 3, fill #0
  Filled 2023-02-01: qty 20.1, 50d supply, fill #0

## 2023-01-22 ENCOUNTER — Other Ambulatory Visit: Payer: Self-pay

## 2023-02-01 ENCOUNTER — Other Ambulatory Visit: Payer: Self-pay

## 2023-02-01 ENCOUNTER — Other Ambulatory Visit (HOSPITAL_COMMUNITY): Payer: Self-pay

## 2023-02-04 ENCOUNTER — Ambulatory Visit: Payer: PPO | Admitting: Hematology & Oncology

## 2023-02-04 ENCOUNTER — Other Ambulatory Visit: Payer: PPO

## 2023-02-08 ENCOUNTER — Emergency Department: Payer: PPO

## 2023-02-08 ENCOUNTER — Ambulatory Visit
Admission: EM | Admit: 2023-02-08 | Discharge: 2023-02-08 | Disposition: A | Discharge status: Home / Self Care | Payer: PPO | Attending: Emergency Medicine | Admitting: Emergency Medicine

## 2023-02-08 ENCOUNTER — Other Ambulatory Visit: Payer: Self-pay

## 2023-02-08 ENCOUNTER — Inpatient Hospital Stay: Payer: PPO | Admitting: Hematology & Oncology

## 2023-02-08 ENCOUNTER — Ambulatory Visit (INDEPENDENT_AMBULATORY_CARE_PROVIDER_SITE_OTHER): Payer: PPO

## 2023-02-08 ENCOUNTER — Inpatient Hospital Stay: Payer: PPO

## 2023-02-08 ENCOUNTER — Emergency Department
Admission: EM | Admit: 2023-02-08 | Discharge: 2023-02-08 | Disposition: A | Discharge status: Home / Self Care | Payer: PPO | Attending: Emergency Medicine | Admitting: Emergency Medicine

## 2023-02-08 DIAGNOSIS — Z1152 Encounter for screening for COVID-19: Secondary | ICD-10-CM | POA: Diagnosis not present

## 2023-02-08 DIAGNOSIS — J441 Chronic obstructive pulmonary disease with (acute) exacerbation: Secondary | ICD-10-CM

## 2023-02-08 DIAGNOSIS — J9601 Acute respiratory failure with hypoxia: Secondary | ICD-10-CM

## 2023-02-08 DIAGNOSIS — R0902 Hypoxemia: Secondary | ICD-10-CM | POA: Diagnosis not present

## 2023-02-08 DIAGNOSIS — R0602 Shortness of breath: Secondary | ICD-10-CM | POA: Diagnosis present

## 2023-02-08 DIAGNOSIS — R0989 Other specified symptoms and signs involving the circulatory and respiratory systems: Secondary | ICD-10-CM | POA: Diagnosis not present

## 2023-02-08 DIAGNOSIS — J439 Emphysema, unspecified: Secondary | ICD-10-CM

## 2023-02-08 DIAGNOSIS — Z96611 Presence of right artificial shoulder joint: Secondary | ICD-10-CM | POA: Diagnosis not present

## 2023-02-08 DIAGNOSIS — J449 Chronic obstructive pulmonary disease, unspecified: Secondary | ICD-10-CM | POA: Diagnosis not present

## 2023-02-08 DIAGNOSIS — Z853 Personal history of malignant neoplasm of breast: Secondary | ICD-10-CM | POA: Insufficient documentation

## 2023-02-08 LAB — BASIC METABOLIC PANEL
Anion gap: 13 (ref 5–15)
BUN: 16 mg/dL (ref 8–23)
CO2: 23 mmol/L (ref 22–32)
Calcium: 8.9 mg/dL (ref 8.9–10.3)
Chloride: 102 mmol/L (ref 98–111)
Creatinine, Ser: 0.78 mg/dL (ref 0.44–1.00)
GFR, Estimated: >60 mL/min (ref >60)
Glucose, Bld: 143 mg/dL — ABNORMAL HIGH (ref 70–99)
Potassium: 3.8 mmol/L (ref 3.5–5.1)
Sodium: 138 mmol/L (ref 135–145)

## 2023-02-08 LAB — CBC
HCT: 42.8 % (ref 36.0–46.0)
Hemoglobin: 13.5 g/dL (ref 12.0–15.0)
MCH: 26.8 pg (ref 26.0–34.0)
MCHC: 31.5 g/dL (ref 30.0–36.0)
MCV: 85.1 fL (ref 80.0–100.0)
Platelets: 406 10*3/uL — ABNORMAL HIGH (ref 150–400)
RBC: 5.03 MIL/uL (ref 3.87–5.11)
RDW: 14.6 % (ref 11.5–15.5)
WBC: 7.2 10*3/uL (ref 4.0–10.5)
nRBC: 0 % (ref 0.0–0.2)

## 2023-02-08 LAB — TROPONIN I (HIGH SENSITIVITY): Troponin I (High Sensitivity): 3 ng/L (ref <18)

## 2023-02-08 LAB — LACTIC ACID, PLASMA: Lactic Acid, Venous: 1.6 mmol/L (ref 0.5–1.9)

## 2023-02-08 LAB — RESP PANEL BY RT-PCR (RSV, FLU A&B, COVID)  RVPGX2
Influenza A by PCR: NEGATIVE
Influenza B by PCR: NEGATIVE
Resp Syncytial Virus by PCR: NEGATIVE
SARS Coronavirus 2 by RT PCR: NEGATIVE

## 2023-02-08 MED ORDER — ALBUTEROL SULFATE (2.5 MG/3ML) 0.083% IN NEBU: 2.5000 mg | INHALATION_SOLUTION | Freq: Four times a day (QID) | RESPIRATORY_TRACT | 0 refills | Status: AC | PRN

## 2023-02-08 MED ORDER — IPRATROPIUM-ALBUTEROL 0.5-2.5 (3) MG/3ML IN SOLN
3.0000 mL | Freq: Once | RESPIRATORY_TRACT | Status: AC
  Administered 2023-02-08: 3 mL via RESPIRATORY_TRACT

## 2023-02-08 MED ORDER — ALBUTEROL SULFATE (2.5 MG/3ML) 0.083% IN NEBU
5.0000 mg | INHALATION_SOLUTION | Freq: Once | RESPIRATORY_TRACT | Status: AC
  Administered 2023-02-08: 5 mg via RESPIRATORY_TRACT
  Filled 2023-02-08: qty 6

## 2023-02-08 MED ORDER — GUAIFENESIN ER 600 MG PO TB12: 600.0000 mg | ORAL_TABLET | Freq: Two times a day (BID) | ORAL | 0 refills | Status: AC

## 2023-02-08 MED ORDER — METHYLPREDNISOLONE SODIUM SUCC 125 MG IJ SOLR
125.0000 mg | INTRAMUSCULAR | Status: AC
  Administered 2023-02-08: 125 mg via INTRAVENOUS
  Filled 2023-02-08: qty 2

## 2023-02-08 MED ORDER — PREDNISONE 20 MG PO TABS: 40.0000 mg | ORAL_TABLET | Freq: Every day | ORAL | 0 refills | Status: AC

## 2023-02-08 MED ORDER — IOHEXOL 350 MG/ML SOLN
75.0000 mL | Freq: Once | INTRAVENOUS | Status: AC | PRN
  Administered 2023-02-08: 75 mL via INTRAVENOUS

## 2023-02-08 MED ORDER — DOXYCYCLINE HYCLATE 100 MG PO CAPS: 100.0000 mg | ORAL_CAPSULE | Freq: Two times a day (BID) | ORAL | 0 refills | Status: AC

## 2023-02-08 NOTE — ED Notes (Signed)
 Assisted to car w/ O2 tank.

## 2023-02-08 NOTE — ED Provider Notes (Signed)
 CAY RALPH PELT    CSN: [id number] Arrival date & time: 02/08/23  0802      History   Chief Complaint Chief Complaint  Patient presents with   Cough    HPI Sherri Gibson is a 63 y.o. female.  Patient presents with 4-day history of congestion and cough.  Her symptoms have gotten worse.  She has been using her oxygen  and breathing treatments without improvement.  Her baseline oxygen  requirement at home is 2 L via nasal cannula at night.  Since the onset of her illness, she has needed her oxygen  around the clock.  Today she has kept her oxygen  at 4 L via nasal cannula.  Her O2 sat dropped to 79% at home when she was walking in her house.  She feels short of breath with ambulation.  No fever or chest pain.  Her medical history includes severe COPD and chronic hypoxemic respiratory failure.  The history is provided by the patient and medical records.    Past Medical History:  Diagnosis Date   Anemia    as a child   Arthritis    Both Shoulders   Breast cancer (HCC) 2016   Left    Complication of anesthesia    states she had a hard time staying awake after hand surgery   COPD (chronic obstructive pulmonary disease) (HCC)    uses O2 at 2l at night   High blood pressure    High cholesterol    History of kidney stones    Personal history of radiation therapy 2016   Left Breast Cancer   Pneumonia    as a child   Pre-diabetes    Prediabetes    Wears dentures    upper dentures    Patient Active Problem List   Diagnosis Date Noted   COPD exacerbation (HCC) 12/03/2022   Type 2 diabetes mellitus with hyperlipidemia (HCC) 08/03/2022   Well adult exam 02/01/2022   Injury of left foot 07/03/2021   COPD with exacerbation (HCC) 05/02/2021   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   Arthritis of right shoulder region    Left wrist tendonitis 08/12/2020   Dysphagia    Gastroesophageal reflux disease with esophagitis without hemorrhage    Gastritis and  gastroduodenitis    Gastric nodule    Colon cancer screening    Arthritis of left shoulder region    S/P reverse total shoulder arthroplasty, 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   Encounter for screening for lung cancer 04/13/2019   Primary osteoarthritis of both shoulders 03/06/2019   Chronic hypoxemic respiratory failure (HCC) 12/22/2018   Osteopenia 12/20/2017   Hyperlipidemia associated with type 2 diabetes mellitus (HCC) 09/23/2017   Colonoscopy refused 05/29/2017   COPD, severe (HCC) 05/29/2017   Multiple pulmonary nodules 05/29/2017   Trigger thumb, right thumb 05/29/2017   Primary osteoarthritis of first carpometacarpal joint of left hand 05/29/2017   Kidney lesion 12/14/2016   Erythrocytosis 11/13/2016   Breast cancer (HCC) 09/20/2016   Essential hypertension 09/20/2016   Insomnia 09/20/2016   Mixed hyperlipidemia 09/20/2016    Past Surgical History:  Procedure Laterality Date   BIOPSY  07/11/2020   Procedure: BIOPSY;  Surgeon: San Sandor GAILS, DO;  Location: [facility];  Service: Gastroenterology;;   BIOPSY  08/11/2020   Procedure: BIOPSY;  Surgeon: Teressa Toribio SQUIBB, MD;  Location: [facility];  Service: Endoscopy;;   BREAST BIOPSY     BREAST LUMPECTOMY Left 2021   BREAST LUMPECTOMY WITH AXILLARY LYMPH NODE BIOPSY  2016   COLONOSCOPY  2017   [facility]  medical [facility]. think there was colon polyps   COLONOSCOPY WITH PROPOFOL  N/A 07/11/2020   Procedure: COLONOSCOPY WITH PROPOFOL ;  Surgeon: San Sandor GAILS, DO;  Location: [facility];  Service: Gastroenterology;  Laterality: N/A;   ESOPHAGOGASTRODUODENOSCOPY N/A 06/14/2022   Procedure: ESOPHAGOGASTRODUODENOSCOPY (EGD);  Surgeon: Wilhelmenia Aloha Raddle., MD;  Location: [facility];  Service: Gastroenterology;  Laterality: N/A;   ESOPHAGOGASTRODUODENOSCOPY (EGD) WITH PROPOFOL  N/A 07/11/2020   Procedure: ESOPHAGOGASTRODUODENOSCOPY (EGD) WITH PROPOFOL ;  Surgeon: San Sandor GAILS, DO;  Location: [facility];   Service: Gastroenterology;  Laterality: N/A;   ESOPHAGOGASTRODUODENOSCOPY (EGD) WITH PROPOFOL  N/A 08/11/2020   Procedure: ESOPHAGOGASTRODUODENOSCOPY (EGD) WITH PROPOFOL ;  Surgeon: Teressa Toribio SQUIBB, MD;  Location: [facility];  Service: Endoscopy;  Laterality: N/A;   EUS N/A 08/11/2020   Procedure: UPPER ENDOSCOPIC ULTRASOUND (EUS) RADIAL;  Surgeon: Teressa Toribio SQUIBB, MD;  Location: [facility];  Service: Endoscopy;  Laterality: N/A;   EUS N/A 06/14/2022   Procedure: UPPER ENDOSCOPIC ULTRASOUND (EUS) RADIAL;  Surgeon: Wilhelmenia Aloha Raddle., MD;  Location: [facility];  Service: Gastroenterology;  Laterality: N/A;   HAND SURGERY Left    PARTIAL HYSTERECTOMY     POLYPECTOMY  06/14/2022   Procedure: POLYPECTOMY;  Surgeon: Wilhelmenia Aloha Raddle., MD;  Location: [facility];  Service: Gastroenterology;;   HARLEY DILATION N/A 07/11/2020   Procedure: HARLEY DILATION;  Surgeon: San Sandor GAILS, DO;  Location: [facility];  Service: Gastroenterology;  Laterality: N/A;   TOTAL SHOULDER ARTHROPLASTY Left 04/12/2020   Procedure: LEFT REVERSE TOTAL SHOULDER ARTHROPLASTY;  Surgeon: Addie Cordella Hamilton, MD;  Location: [facility] OR;  Service: Orthopedics;  Laterality: Left;   TOTAL SHOULDER ARTHROPLASTY Right 09/13/2020   Procedure: RIGHT TOTAL SHOULDER ARTHROPLASTY;  Surgeon: Addie Cordella Hamilton, MD;  Location: [facility] OR;  Service: Orthopedics;  Laterality: Right;    OB History   No obstetric history on file.      Home Medications    Prior to Admission medications   Medication Sig Start Date End Date Taking? Authorizing Provider  albuterol  (PROVENTIL ) (2.5 MG/3ML) 0.083% nebulizer solution Take 3 mLs (2.5 mg total) by nebulization every 6 (six) hours as needed for wheezing or shortness of breath. 09/05/22   Kassie Acquanetta Bradley, MD  albuterol  (VENTOLIN  HFA) 108 [phone] Base) MCG/ACT inhaler Inhale 2 puffs into the lungs every 4 (four) hours as needed for wheezing or shortness of breath. 01/21/23   Kassie Acquanetta Bradley, MD   amLODipine  (NORVASC ) 10 MG tablet Take 1 tablet (10 mg total) by mouth daily. 09/19/22   Alvia Bring, DO  aspirin  EC 81 MG tablet Take 81 mg by mouth every evening. Swallow whole.    [provider]  atorvastatin  (LIPITOR) 40 MG tablet Take 1 tablet (40 mg total) by mouth every evening. 08/31/22   Alvia Bring, DO  benzonatate  (TESSALON ) 200 MG capsule Take 200 mg by mouth 2 (two) times daily as needed for cough. 11/25/22   [provider]  cyclobenzaprine  (FLEXERIL ) 10 MG tablet Take 1 tablet (10 mg total) by mouth daily as needed for muscle spasms. 11/14/22   Alvia Bring, DO  fluticasone -salmeterol (ADVAIR  HFA) 230-21 MCG/ACT inhaler Inhale 2 puffs into the lungs 2 (two) times daily. 09/05/22   Kassie Acquanetta Bradley, MD  levofloxacin  (LEVAQUIN ) 750 MG tablet Take 1 tablet (750 mg total) by mouth daily. Patient not taking: Reported on 02/08/2023 01/04/23   Kara Dorn NOVAK, MD  loratadine  (CLARITIN ) 10 MG tablet TAKE 1  TABLET BY MOUTH EVERY DAY 04/03/22   Alvia Bring, DO  losartan  (COZAAR ) 50 MG tablet Take 1 tablet (50 mg total) by mouth daily. 12/14/22   Alvia Bring, DO  metFORMIN  (GLUCOPHAGE ) 500 MG tablet Take 2 tablets (1,000 mg total) by mouth daily. 08/31/22   Alvia Bring, DO  montelukast  (SINGULAIR ) 10 MG tablet Take 1 tablet (10 mg total) by mouth at bedtime. Patient taking differently: Take 1,800 mg by mouth at bedtime. 02/01/22   Alvia Bring, DO  OXYGEN  Inhale 2 L into the lungs at bedtime.    [provider]  pantoprazole  (PROTONIX ) 40 MG tablet Take 1 tablet (40 mg total) by mouth 2 (two) times daily. 01/02/22   Cirigliano, Vito V, DO  Respiratory Therapy Supplies (NEBULIZER) DEVI As directed for Dx COPD 10/04/20   Alexander, Natalie, DO  sodium chloride  HYPERTONIC 3 % nebulizer solution Take by nebulization 2 (two) times daily. 01/04/23   Kara Dorn NOVAK, MD  Suvorexant  (BELSOMRA ) 20 MG TABS Take 1 tablet by mouth at bedtime as needed. 12/12/22    Alvia Bring, DO  Tiotropium Bromide  Monohydrate (SPIRIVA  RESPIMAT) 2.5 MCG/ACT AERS Inhale 2 puffs into the lungs daily. 09/05/22   Kassie Acquanetta Bradley, MD    Family History Family History  Problem Relation Age of Onset   Breast cancer Mother    Lung cancer Father        smoker   Diabetes Brother    Colon cancer Neg Hx    Esophageal cancer Neg Hx     Social History Social History   Tobacco Use   Smoking status: Former    Current packs/day: 0.00    Average packs/day: 2.5 packs/day for 44.0 years (110.0 ttl pk-yrs)    Types: Cigarettes    Start date: 11/29/1972    Quit date: 11/29/2016    Years since quitting: 6.1   Smokeless tobacco: Never  Vaping Use   Vaping status: Never Used  Substance Use Topics   Alcohol use: Not Currently   Drug use: Never     Allergies   Codeine  and Tetanus toxoids   Review of Systems Review of Systems  Constitutional:  Negative for chills and fever.  HENT:  Positive for congestion. Negative for ear pain and sore throat.   Respiratory:  Positive for cough, shortness of breath and wheezing.   Cardiovascular:  Negative for chest pain and palpitations.     Physical Exam Triage Vital Signs ED Triage Vitals  Encounter Vitals Group     BP      Systolic BP Percentile      Diastolic BP Percentile      Pulse      Resp      Temp      Temp src      SpO2      Weight      Height      Head Circumference      Peak Flow      Pain Score      Pain Loc      Pain Education      Exclude from Growth Chart    No data found.  Updated Vital Signs BP 124/84   Pulse (!) 114   Temp 98.6 F (37 C)   Resp (!) 27   SpO2 (!) 86%   Visual Acuity Right Eye Distance:   Left Eye Distance:   Bilateral Distance:    Right Eye Near:   Left Eye Near:    Bilateral  Near:     Physical Exam Constitutional:      General: She is not in acute distress.    Appearance: She is ill-appearing.  HENT:     Mouth/Throat:     Mouth: Mucous membranes are  moist.  Cardiovascular:     Rate and Rhythm: Normal rate and regular rhythm.     Heart sounds: Normal heart sounds.  Pulmonary:     Effort: Pulmonary effort is normal. Tachypnea present. No respiratory distress.     Breath sounds: Normal breath sounds. Decreased air movement present.  Neurological:     Mental Status: She is alert.      UC Treatments / Results  Labs (all labs ordered are listed, but only abnormal results are displayed) Labs Reviewed - No data to display  EKG   Radiology DG Chest 2 View Result Date: 02/08/2023 CLINICAL DATA:  63 year old female with cough, shortness of breath, congestion, hypoxia. EXAM: CHEST - 2 VIEW COMPARISON:  Chest CT 12/31/2022 and earlier. FINDINGS: PA and lateral views 0824 hours. Bilateral shoulder arthroplasty again noted. Normal cardiac size and mediastinal contours. Large lung volumes. Emphysema confirmed on CT last month. Visualized tracheal air column is within normal limits. Coarse bilateral pulmonary interstitial opacity appears to be chronic and has not significantly changed from last year. No pneumothorax, pleural effusion, acute pulmonary opacity. No acute osseous abnormality identified. Negative visible bowel gas. IMPRESSION: Emphysema (ICD10-J43.9). No acute cardiopulmonary abnormality. Electronically Signed   By: VEAR Hurst M.D.   On: 02/08/2023 08:42    Procedures Procedures (including critical care time)  Medications Ordered in UC Medications  ipratropium-albuterol  (DUONEB) 0.5-2.5 (3) MG/3ML nebulizer solution 3 mL (3 mLs Nebulization Given 02/08/23 0840)    Initial Impression / Assessment and Plan / UC Course  I have reviewed the triage vital signs and the nursing notes.  Pertinent labs & imaging results that were available during my care of the patient were reviewed by me and considered in my medical decision making (see chart for details).    Acute respiratory failure with hypoxia, COPD exacerbation, emphysema.  O2 sat  86% on 3 L via nasal cannula.  At rest her O2 sat increased to 90%.  DuoNeb given here.  Patient adamantly declines EMS to take her to the ED.  She drove herself here and insists on driving herself to the ED.  Her preference is to go to [facility] ED but we discussed that, since she will not agree to transport via EMS, she will go to the nearest ED which is [facility].  Final Clinical Impressions(s) / UC Diagnoses   Final diagnoses:  COPD exacerbation (HCC)  Acute respiratory failure with hypoxia (HCC)  Pulmonary emphysema, unspecified emphysema type [facility])     Discharge Instructions      Since you will not allow us  to call an ambulance to take you to the hospital, drive yourself directly to the [facility] emergency department for evaluation of your hypoxia with COPD exacerbation.         ED Prescriptions   None    PDMP not reviewed this encounter.   Corlis Burnard VEAR, NP 02/08/23 [phone]

## 2023-02-08 NOTE — ED Notes (Signed)
 Patient is being discharged from the Urgent Care and sent to the Emergency Department via POV (patient adamantly refuses EMS transfer) . Per Burnard Cork NP, patient is in need of higher level of care due to hypoxia. Patient is aware and verbalizes understanding of plan of care.  Vitals:   02/08/23 0832 02/08/23 0834  BP:    Pulse:    Resp: (!) 27   Temp:    SpO2: (!) 86% (!) 86%

## 2023-02-08 NOTE — ED Triage Notes (Addendum)
 Patient to Urgent Care with complaints of cough/ congestion/ SHOB/ tachypnea/ hypoxia at home (usual O2 sat 92-96%, lowest sat seen at home 79% with ambulation).    Patient currently only O2 dependent on 2L when sleeping. Reports currently requiring 4L via concentrator at all times.   Symptoms started Monday; started requiring O2 on Tuesday.

## 2023-02-08 NOTE — ED Notes (Signed)
 Patient denies improvement after breathing treatment.

## 2023-02-08 NOTE — ED Notes (Signed)
Unsuccessful IV attempt.

## 2023-02-08 NOTE — ED Notes (Signed)
 IV team at bedside

## 2023-02-08 NOTE — ED Notes (Signed)
 Unsuccessful IV attempt. Will put in for an IV placement consult. . MD aware.

## 2023-02-08 NOTE — ED Notes (Signed)
 Patient is resting comfortably. Patient becomes more short of breath when transferring to wheelchair to go to the bathroom. Patient is 96% on 3L. O2 reduced to 2L at this time. Patient denies discomfort at this time.

## 2023-02-08 NOTE — Discharge Instructions (Addendum)
 Since you will not allow [name] to call an ambulance to take you to the hospital, drive yourself directly to the [facility] emergency department for evaluation of your hypoxia with COPD exacerbation.

## 2023-02-08 NOTE — ED Triage Notes (Signed)
 Pt to ED from UC for shob. Reports cough started Monday and increased shob since then. Wears 2 L [place] at night, reports has been increasing use since Monday. Pt able to speak in complete sentence.s  88% on 2 L [place], increased to 3 L

## 2023-02-08 NOTE — ED Provider Notes (Signed)
 [facility] Provider Note    Event Date/Time   First MD Initiated Contact with Patient 02/08/23 [phone]     (approximate)   History   Chief Complaint: Shortness of Breath   HPI  Sherri Gibson is a 63 y.o. female with a history of breast cancer, COPD who comes to the ED due to shortness of breath and cough which have been worsening for the past 5 days.  Recently was treated for COPD exacerbation about 3 weeks ago with steroids and antibiotics, now feeling more short of breath with dyspnea on exertion over the last few days.  Reports that she normally does not use oxygen  during the day, uses 2 L at night while sleeping, and over the last few days she has needed 2 to 3 L continuously.  Urgent care reports patient required 4 L nasal cannula to correct hypoxia.  No fever.  Denies chest pain.  Went to urgent care this morning and was sent to ED for further evaluation.  Received DuoNeb x 1 there in urgent care.          Physical Exam   Triage Vital Signs: ED Triage Vitals  Encounter Vitals Group     BP 02/08/23 0909 121/88     Systolic BP Percentile --      Diastolic BP Percentile --      Pulse Rate 02/08/23 0912 (!) 123     Resp 02/08/23 0909 (!) 22     Temp 02/08/23 0913 97.9 F (36.6 C)     Temp src --      SpO2 02/08/23 0912 (!) 88 %     Weight 02/08/23 0910 130 lb (59 kg)     Height 02/08/23 0910 4' 11 (1.499 m)     Head Circumference --      Peak Flow --      Pain Score 02/08/23 0909 0     Pain Loc --      Pain Education --      Exclude from Growth Chart --     Most recent vital signs: Vitals:   02/08/23 1253 02/08/23 1300  BP: (!) 113/102 117/78  Pulse: 100 100  Resp: 20 (!) 23  Temp:    SpO2: 96% 94%    General: Awake, no distress.  CV:  Good peripheral perfusion.  Tachycardia heart rate 110 Resp:  Mild tachypnea, respiratory rate of 22.  Quiet breath sounds diffusely, no focal crackles or wheezing Abd:  No distention.  Soft  nontender Other:  No lower extremity edema   ED Results / Procedures / Treatments   Labs (all labs ordered are listed, but only abnormal results are displayed) Labs Reviewed  CBC - Abnormal; Notable for the following components:      Result Value   Platelets 406 (*)    All other components within normal limits  BASIC METABOLIC PANEL - Abnormal; Notable for the following components:   Glucose, Bld 143 (*)    All other components within normal limits  RESP PANEL BY RT-PCR (RSV, FLU A&B, COVID)  [id number]  LACTIC ACID, PLASMA  TROPONIN I (HIGH SENSITIVITY)     EKG Interpreted by me Sinus tachycardia rate 124.  Right axis, poor R wave progression.  Normal ST segments and T waves.  Normal intervals normal   RADIOLOGY Chest x-ray interpreted by me, unremarkable.  Radiology report reviewed   PROCEDURES:  Procedures   MEDICATIONS ORDERED IN ED: Medications  methylPREDNISolone  sodium succinate (SOLU-MEDROL )  125 mg/2 mL injection 125 mg (125 mg Intravenous Given 02/08/23 1108)  albuterol  (PROVENTIL ) (2.5 MG/3ML) 0.083% nebulizer solution 5 mg (5 mg Nebulization Given 02/08/23 1036)  iohexol  (OMNIPAQUE ) 350 MG/ML injection 75 mL (75 mLs Intravenous Contrast Given 02/08/23 1120)     IMPRESSION / MDM / ASSESSMENT AND PLAN / ED COURSE  I reviewed the triage vital signs and the nursing notes.  DDx: Pneumonia, pleural effusion, COPD exacerbation, pulmonary embolism, non-STEMI, electrolyte abnormality, anemia, COVID, influenza  Patient's presentation is most consistent with acute presentation with potential threat to life or bodily function.  Patient presents with worsening shortness of breath and dyspnea on exertion over the past several days, associated cough.  She has a history of breast cancer, has nondiagnostic chest x-ray, had recent respiratory illness that may be persistent to this episode.  Will need CT angiogram of the chest to further evaluate while giving Solu-Medrol ,  albuterol  nebs.   Clinical Course as of 02/08/23 1351  Fri Feb 08, 2023  1350 CTA chest unremarkable.  IMPRESSION: 1. No embolism to the proximal subsegmental pulmonary artery level. 2. No lung mass, consolidation, pleural effusion or pneumothorax. 3. There is a small grouping of tree-in-bud configuration nodules in the left lung lower lobe, new since the prior study, but favored to represent sequela of infection or inflammation. Otherwise no suspicious lung nodules. 4. Multiple other nonacute observations, as described above.  Aortic Atherosclerosis (ICD10-I70.0) and Emphysema (ICD10-J43.9).   Pt feeling better. Lung aeration improved. Ambulated pt, 93% on 1.5L [place]. Feels comfortable with DC home. Rx pred, doxy, refill alb neb soln.  [PS]    Clinical Course User Index [PS] Viviann Pastor, MD     FINAL CLINICAL IMPRESSION(S) / ED DIAGNOSES   Final diagnoses:  COPD exacerbation (HCC)     Rx / DC Orders   ED Discharge Orders          Ordered    predniSONE  (DELTASONE ) 20 MG tablet  Daily with breakfast        02/08/23 1341    doxycycline  (VIBRAMYCIN ) 100 MG capsule  2 times daily        02/08/23 1341    albuterol  (PROVENTIL ) (2.5 MG/3ML) 0.083% nebulizer solution  Every 6 hours PRN        02/08/23 1349    guaiFENesin  (MUCINEX ) 600 MG 12 hr tablet  2 times daily        02/08/23 1349             Note:  This document was prepared using Dragon voice recognition software and may include unintentional dictation errors.   Viviann Pastor, MD 02/08/23 1351

## 2023-02-08 NOTE — ED Notes (Signed)
 Ambulatory sat 87% on 4L via concentrator, 92% at rest.

## 2023-02-08 NOTE — ED Notes (Signed)
 Patient has portable O2 with her.

## 2023-02-08 NOTE — ED Notes (Signed)
 O2 reduced to 2L via [place]. Pulse ox is 96% while talking. Patient has a clear voice, no difficulty in completing sentences.

## 2023-02-15 ENCOUNTER — Telehealth: Payer: Self-pay

## 2023-02-15 NOTE — Transitions of Care (Post Inpatient/ED Visit) (Signed)
02/15/2023  Name: Sherri Gibson MRN: [id number] DOB: 08-15-1960  Today's TOC FU Call Status: Today's TOC FU Call Status:: Successful TOC FU Call Completed TOC FU Call Complete Date: 02/15/23 Patient's Name and Date of Birth confirmed.  Transition Care Management Follow-up Telephone Call Date of Discharge: 02/14/23 Discharge Facility: Other [facility]) Name of Other ([facility]) Discharge Facility: [facility] Type of Discharge: Inpatient Admission Primary Inpatient Discharge Diagnosis:: Chronic Obstructive Pulmonary Disease Exacerbation How have you been since you were released from the hospital?: Same (Patient notes she is feeling better but still very weak) Any questions or concerns?: No  Items Reviewed: Did you receive and understand the discharge instructions provided?: Yes Medications obtained,verified, and reconciled?: Yes (Medications Reviewed) Any new allergies since your discharge?: No Dietary orders reviewed?: Yes Type of Diet Ordered:: Regular Do you have support at home?: Yes People in Home: other relative(s) Name of Support/Comfort Primary Source: Becky-niece  Medications Reviewed Today: Medications Reviewed Today     Reviewed by Jodelle Gross, RN (Case Manager) on 02/15/23 at [id number]  Med List Status: <None>   Medication Order Taking? Sig Documenting Provider Last Dose Status Informant  albuterol (PROVENTIL) (2.5 MG/3ML) 0.083% nebulizer solution [id number] Yes Take 3 mLs (2.5 mg total) by nebulization every 6 (six) hours as needed for wheezing or shortness of breath. Luciano Cutter, MD Taking Active   albuterol (PROVENTIL) (2.5 MG/3ML) 0.083% nebulizer solution [id number] Yes Take 3 mLs (2.5 mg total) by nebulization every 6 (six) hours as needed for wheezing or shortness of breath. Sharman Cheek, MD Taking Active   albuterol (VENTOLIN HFA) [phone] Base) MCG/ACT inhaler [id number] Yes Inhale 2 puffs into the lungs every 4 (four) hours as needed  for wheezing or shortness of breath. Luciano Cutter, MD Taking Active   amLODipine (NORVASC) 10 MG tablet [id number] Yes Take 1 tablet (10 mg total) by mouth daily. Everrett Coombe, DO Taking Active   aspirin EC 81 MG tablet [id number] Yes Take 81 mg by mouth every evening. Swallow whole. [provider] Taking Active Self  atorvastatin (LIPITOR) 40 MG tablet [id number] Yes Take 1 tablet (40 mg total) by mouth every evening. Everrett Coombe, DO Taking Active   azithromycin (ZITHROMAX) 250 MG tablet [id number] Yes Take 1 tablet by mouth daily. [provider] Taking Active   benzonatate (TESSALON) 200 MG capsule [id number] Yes Take 200 mg by mouth 2 (two) times daily as needed for cough. [provider] Taking Active   cyclobenzaprine (FLEXERIL) 10 MG tablet [id number] Yes Take 1 tablet (10 mg total) by mouth daily as needed for muscle spasms. Everrett Coombe, DO Taking Active   doxycycline (VIBRAMYCIN) 100 MG capsule [id number] Yes Take 1 capsule (100 mg total) by mouth 2 (two) times daily for 10 days. Sharman Cheek, MD Taking Active   fluticasone-salmeterol [facility]) [phone] MCG/ACT inhaler [id number] Yes Inhale 2 puffs into the lungs 2 (two) times daily. Luciano Cutter, MD Taking Active   guaiFENesin (MUCINEX) 600 MG 12 hr tablet [id number] Yes Take 1 tablet (600 mg total) by mouth 2 (two) times daily for 15 days. Sharman Cheek, MD Taking Active   levofloxacin [facility]) 750 MG tablet [id number] No Take 1 tablet (750 mg total) by mouth daily.  Patient not taking: Reported on 02/15/2023   Sherri Sinner, MD Not Taking Active   loratadine (CLARITIN) 10 MG tablet [id number] Yes TAKE 1 TABLET BY MOUTH EVERY DAY Everrett Coombe, DO Taking Active Self  losartan (COZAAR) 50 MG tablet  [id number] Yes Take 1 tablet (50 mg total) by mouth daily. Everrett Coombe, DO Taking Active   melatonin 3 MG TABS tablet [id number] Yes 3 mg at bedtime as needed. [provider] Taking  Active   metFORMIN (GLUCOPHAGE) 500 MG tablet [id number] Yes Take 2 tablets (1,000 mg total) by mouth daily. Everrett Coombe, DO Taking Active   montelukast (SINGULAIR) 10 MG tablet [id number] Yes Take 1 tablet (10 mg total) by mouth at bedtime.  Patient taking differently: Take 1,800 mg by mouth at bedtime.   Everrett Coombe, DO Taking Active Self  OXYGEN [id number] Yes Inhale 2 L into the lungs at bedtime. [provider] Taking Active Self  pantoprazole (PROTONIX) 40 MG tablet [id number] Yes Take 1 tablet (40 mg total) by mouth 2 (two) times daily. Cirigliano, Verlin Dike, DO Taking Active Self  predniSONE (DELTASONE) 10 MG tablet [id number] Yes Take 3 tablets by mouth daily for 3 days then 2 tablets by mouth daily for 4 days then 1 tablet by mouth daily for 4 days then stop. [provider] Taking Active            Med Note Electa Sniff, [facility]   Fri Feb 15, 2023  9:40 AM) Patient noted her niece will be picking up today  Respiratory Therapy Supplies (NEBULIZER) DEVI [id number] Yes As directed for Dx COPD Sunnie Nielsen, DO Taking Active Self  sodium chloride HYPERTONIC 3 % nebulizer solution [id number] Yes Take by nebulization 2 (two) times daily. Sherri Sinner, MD Taking Active   Suvorexant [facility]) 20 MG TABS [id number] Yes Take 1 tablet by mouth at bedtime as needed. Everrett Coombe, DO Taking Active   Tiotropium Bromide Monohydrate (SPIRIVA RESPIMAT) 2.5 MCG/ACT AERS [id number] Yes Inhale 2 puffs into the lungs daily. Luciano Cutter, MD Taking Active             Home Care and Equipment/Supplies: Were Home Health Services Ordered?: No Any new equipment or medical supplies ordered?: No  Functional Questionnaire: Do you need assistance with bathing/showering or dressing?: No Do you need assistance with meal preparation?: No Do you need assistance with eating?: No Do you have difficulty maintaining continence: No Do you need assistance with getting out of bed/getting  out of a chair/moving?: No Do you have difficulty managing or taking your medications?: No  Follow up appointments reviewed: PCP Follow-up appointment confirmed?: No (Patient moved to [facility] and has not found a new PCP.  Offered to help and she declined saying she will find one) Specialist Hospital Follow-up appointment confirmed?: Yes Date of Specialist follow-up appointment?: 03/18/23 Follow-Up Specialty Provider:: Dr. Everardo All Do you need transportation to your follow-up appointment?: No Do you understand care options if your condition(s) worsen?: Yes-patient verbalized understanding  SDOH Interventions Today    Flowsheet Row Most Recent Value  SDOH Interventions   Food Insecurity Interventions Intervention Not Indicated  Housing Interventions Intervention Not Indicated  Transportation Interventions Intervention Not Indicated  Utilities Interventions Intervention Not Indicated     Jodelle Gross RN, BSN, CCM RN Care Manager  Transitions of Care  [facility]  [phone]

## 2023-02-20 ENCOUNTER — Other Ambulatory Visit: Payer: Self-pay

## 2023-02-22 ENCOUNTER — Other Ambulatory Visit: Payer: Self-pay | Admitting: Family Medicine

## 2023-02-22 ENCOUNTER — Other Ambulatory Visit: Payer: Self-pay

## 2023-02-22 DIAGNOSIS — E1169 Type 2 diabetes mellitus with other specified complication: Secondary | ICD-10-CM

## 2023-02-25 ENCOUNTER — Other Ambulatory Visit: Payer: Self-pay

## 2023-02-25 ENCOUNTER — Other Ambulatory Visit (HOSPITAL_COMMUNITY): Payer: Self-pay

## 2023-02-25 MED ORDER — METFORMIN HCL 500 MG PO TABS
1000.0000 mg | ORAL_TABLET | Freq: Every day | ORAL | 1 refills | Status: AC
  Filled 2023-02-25: qty 180, 90d supply, fill #0

## 2023-02-28 ENCOUNTER — Other Ambulatory Visit (HOSPITAL_COMMUNITY): Payer: Self-pay

## 2023-02-28 MED ORDER — BELSOMRA 20 MG PO TABS
20.0000 mg | ORAL_TABLET | Freq: Every evening | ORAL | 0 refills | Status: DC | PRN
  Filled 2023-02-28 – 2023-03-31 (×2): qty 30, 30d supply, fill #0

## 2023-03-04 ENCOUNTER — Other Ambulatory Visit: Payer: Self-pay

## 2023-03-04 ENCOUNTER — Other Ambulatory Visit (HOSPITAL_COMMUNITY): Payer: Self-pay

## 2023-03-04 ENCOUNTER — Encounter: Payer: Self-pay | Admitting: Pharmacist

## 2023-03-04 MED ORDER — JANUVIA 100 MG PO TABS
100.0000 mg | ORAL_TABLET | Freq: Every day | ORAL | 3 refills | Status: AC
  Filled 2023-03-04: qty 90, 90d supply, fill #0
  Filled 2023-05-29: qty 90, 90d supply, fill #1

## 2023-03-04 MED ORDER — METFORMIN HCL 1000 MG PO TABS
1000.0000 mg | ORAL_TABLET | Freq: Two times a day (BID) | ORAL | 3 refills | Status: AC
  Filled 2023-03-04: qty 180, 90d supply, fill #0
  Filled 2023-06-12: qty 180, 90d supply, fill #1

## 2023-03-11 ENCOUNTER — Other Ambulatory Visit (HOSPITAL_COMMUNITY): Payer: Self-pay

## 2023-03-11 MED ORDER — AZITHROMYCIN 250 MG PO TABS
250.0000 mg | ORAL_TABLET | Freq: Every day | ORAL | 11 refills | Status: AC
  Filled 2023-03-11 – 2023-04-09 (×2): qty 30, 30d supply, fill #0
  Filled 2023-05-05: qty 30, 30d supply, fill #1
  Filled 2023-05-29: qty 30, 30d supply, fill #2
  Filled 2023-06-26 (×2): qty 30, 30d supply, fill #3

## 2023-03-12 ENCOUNTER — Other Ambulatory Visit (HOSPITAL_COMMUNITY): Payer: Self-pay

## 2023-03-12 MED ORDER — AMLODIPINE BESYLATE 10 MG PO TABS
10.0000 mg | ORAL_TABLET | Freq: Every day | ORAL | 3 refills | Status: AC
  Filled 2023-03-12: qty 90, 90d supply, fill #0
  Filled 2023-06-12: qty 90, 90d supply, fill #1

## 2023-03-13 ENCOUNTER — Other Ambulatory Visit (HOSPITAL_COMMUNITY): Payer: Self-pay

## 2023-03-13 ENCOUNTER — Other Ambulatory Visit: Payer: Self-pay | Admitting: Family Medicine

## 2023-03-13 DIAGNOSIS — J441 Chronic obstructive pulmonary disease with (acute) exacerbation: Secondary | ICD-10-CM

## 2023-03-13 MED ORDER — MONTELUKAST SODIUM 10 MG PO TABS
10.0000 mg | ORAL_TABLET | Freq: Every day | ORAL | 3 refills | Status: AC
  Filled 2023-03-13: qty 90, 90d supply, fill #0
  Filled 2023-06-12: qty 90, 90d supply, fill #1

## 2023-03-14 ENCOUNTER — Other Ambulatory Visit (HOSPITAL_COMMUNITY): Payer: Self-pay

## 2023-03-18 ENCOUNTER — Ambulatory Visit (HOSPITAL_BASED_OUTPATIENT_CLINIC_OR_DEPARTMENT_OTHER): Payer: PPO | Admitting: Pulmonary Disease

## 2023-04-01 ENCOUNTER — Other Ambulatory Visit: Payer: Self-pay

## 2023-04-09 ENCOUNTER — Other Ambulatory Visit (HOSPITAL_COMMUNITY): Payer: Self-pay

## 2023-04-22 ENCOUNTER — Other Ambulatory Visit (HOSPITAL_COMMUNITY): Payer: Self-pay

## 2023-04-22 ENCOUNTER — Telehealth: Payer: Self-pay

## 2023-04-22 ENCOUNTER — Other Ambulatory Visit: Payer: Self-pay

## 2023-04-22 NOTE — Telephone Encounter (Unsigned)
 Copied from CRM [id number]. Topic: General - Other >> Apr 22, 2023 11:23 AM Franchot Heidelberg wrote: Reason for CRM: Lillia Abed from Radiology called requesting missing reports for her breasts.   Best contact: [phone] Fax: [phone]

## 2023-04-23 NOTE — Telephone Encounter (Signed)
 Called  to get clarification , looks like they needed reports from dr.mangel, no additional information is needed on our end

## 2023-05-05 ENCOUNTER — Other Ambulatory Visit: Payer: Self-pay | Admitting: Family Medicine

## 2023-05-05 ENCOUNTER — Other Ambulatory Visit (HOSPITAL_COMMUNITY): Payer: Self-pay

## 2023-05-05 DIAGNOSIS — E782 Mixed hyperlipidemia: Secondary | ICD-10-CM

## 2023-05-06 ENCOUNTER — Other Ambulatory Visit: Payer: Self-pay

## 2023-05-06 ENCOUNTER — Other Ambulatory Visit (HOSPITAL_COMMUNITY): Payer: Self-pay

## 2023-05-06 MED ORDER — BELSOMRA 20 MG PO TABS
20.0000 mg | ORAL_TABLET | Freq: Every evening | ORAL | 0 refills | Status: DC | PRN
Start: 2023-05-05 — End: 2023-05-29
  Filled 2023-05-06: qty 30, 30d supply, fill #0

## 2023-05-11 ENCOUNTER — Other Ambulatory Visit (HOSPITAL_COMMUNITY): Payer: Self-pay

## 2023-05-20 ENCOUNTER — Other Ambulatory Visit (HOSPITAL_COMMUNITY): Payer: Self-pay

## 2023-05-29 ENCOUNTER — Other Ambulatory Visit (HOSPITAL_COMMUNITY): Payer: Self-pay

## 2023-05-29 MED ORDER — BELSOMRA 20 MG PO TABS
20.0000 mg | ORAL_TABLET | Freq: Every evening | ORAL | 0 refills | Status: AC | PRN
Start: 2023-05-29 — End: ?
  Filled 2023-06-12: qty 30, 30d supply, fill #0

## 2023-06-10 ENCOUNTER — Other Ambulatory Visit: Payer: Self-pay

## 2023-06-10 ENCOUNTER — Other Ambulatory Visit (HOSPITAL_COMMUNITY): Payer: Self-pay

## 2023-06-10 MED ORDER — LOSARTAN POTASSIUM 50 MG PO TABS
50.0000 mg | ORAL_TABLET | Freq: Every day | ORAL | 1 refills | Status: AC
  Filled 2023-06-10: qty 90, 90d supply, fill #0

## 2023-06-12 ENCOUNTER — Other Ambulatory Visit (HOSPITAL_COMMUNITY): Payer: Self-pay

## 2023-06-12 ENCOUNTER — Other Ambulatory Visit: Payer: Self-pay

## 2023-06-26 ENCOUNTER — Other Ambulatory Visit (HOSPITAL_COMMUNITY): Payer: Self-pay

## 2023-06-26 ENCOUNTER — Other Ambulatory Visit: Payer: Self-pay

## 2023-06-30 ENCOUNTER — Other Ambulatory Visit: Payer: Self-pay | Admitting: Family Medicine

## 2023-06-30 ENCOUNTER — Other Ambulatory Visit: Payer: Self-pay | Admitting: Gastroenterology

## 2023-06-30 DIAGNOSIS — E782 Mixed hyperlipidemia: Secondary | ICD-10-CM

## 2023-07-01 ENCOUNTER — Other Ambulatory Visit: Payer: Self-pay

## 2023-07-01 ENCOUNTER — Other Ambulatory Visit (HOSPITAL_COMMUNITY): Payer: Self-pay

## 2023-07-01 MED ORDER — PANTOPRAZOLE SODIUM 40 MG PO TBEC
40.0000 mg | DELAYED_RELEASE_TABLET | Freq: Two times a day (BID) | ORAL | 0 refills | Status: AC
  Filled 2023-07-01: qty 180, 90d supply, fill #0

## 2023-07-01 MED ORDER — ATORVASTATIN CALCIUM 40 MG PO TABS
40.0000 mg | ORAL_TABLET | Freq: Every evening | ORAL | 1 refills | Status: AC
  Filled 2023-07-01: qty 90, 90d supply, fill #0

## 2023-07-02 ENCOUNTER — Ambulatory Visit

## 2023-07-02 DIAGNOSIS — Z87891 Personal history of nicotine dependence: Secondary | ICD-10-CM

## 2023-07-02 DIAGNOSIS — Z122 Encounter for screening for malignant neoplasm of respiratory organs: Secondary | ICD-10-CM

## 2023-07-16 ENCOUNTER — Other Ambulatory Visit: Payer: Self-pay

## 2023-07-16 ENCOUNTER — Other Ambulatory Visit (HOSPITAL_COMMUNITY): Payer: Self-pay

## 2023-07-16 MED ORDER — AZITHROMYCIN 250 MG PO TABS: 250.0000 mg | ORAL_TABLET | Freq: Every day | ORAL | 3 refills | Status: AC

## 2023-07-16 MED ORDER — AZITHROMYCIN 250 MG PO TABS
250.0000 mg | ORAL_TABLET | Freq: Every day | ORAL | 3 refills | Status: AC
  Filled 2023-07-16 – 2023-07-19 (×3): qty 90, 90d supply, fill #0
  Filled 2024-02-03: qty 90, 90d supply, fill #1

## 2023-07-16 MED ORDER — CYCLOBENZAPRINE HCL 10 MG PO TABS
10.0000 mg | ORAL_TABLET | Freq: Every evening | ORAL | 1 refills | Status: AC | PRN
  Filled 2023-07-16: qty 90, 90d supply, fill #0
  Filled 2024-02-03: qty 90, 90d supply, fill #1

## 2023-07-17 ENCOUNTER — Other Ambulatory Visit (HOSPITAL_COMMUNITY): Payer: Self-pay

## 2023-07-17 ENCOUNTER — Telehealth: Payer: Self-pay | Admitting: Acute Care

## 2023-07-17 DIAGNOSIS — Z87891 Personal history of nicotine dependence: Secondary | ICD-10-CM

## 2023-07-17 DIAGNOSIS — Z122 Encounter for screening for malignant neoplasm of respiratory organs: Secondary | ICD-10-CM

## 2023-07-17 MED ORDER — BELSOMRA 20 MG PO TABS
20.0000 mg | ORAL_TABLET | Freq: Every evening | ORAL | 0 refills | Status: AC | PRN
  Filled 2023-07-17: qty 30, 30d supply, fill #0

## 2023-07-17 NOTE — Telephone Encounter (Signed)
 Spoke with patient by phone to review results of LDCT. No suspicious findings for lung cancer.  Nodularity noted and thought to be bronchiolitis. Patient sees pulmonary and this has been noted previously. No new symptoms of illness such as increased chest congestion, fatigue or fever.   Atherosclerosis and emphysema again noted.  Small kidney lesion noted with no follow up recommended and patient states she has known about this 'for years.'  Small pericardial effusion, also previously seen.  Hepatic steatosis was noted and patient made aware and discussed healthy diet and exercise. PCP will have copy of results if further recommendations.  Patient acknowledged understanding and had no questions.  Order placed for 1 year LDCT.

## 2023-07-18 ENCOUNTER — Other Ambulatory Visit: Payer: Self-pay

## 2023-07-19 ENCOUNTER — Other Ambulatory Visit (HOSPITAL_COMMUNITY): Payer: Self-pay

## 2023-07-19 ENCOUNTER — Other Ambulatory Visit: Payer: Self-pay

## 2023-07-22 ENCOUNTER — Other Ambulatory Visit: Payer: Self-pay

## 2023-07-22 ENCOUNTER — Other Ambulatory Visit (HOSPITAL_COMMUNITY): Payer: Self-pay

## 2023-07-23 ENCOUNTER — Other Ambulatory Visit: Payer: Self-pay

## 2023-07-23 ENCOUNTER — Other Ambulatory Visit (HOSPITAL_COMMUNITY): Payer: Self-pay

## 2023-08-16 ENCOUNTER — Other Ambulatory Visit (HOSPITAL_COMMUNITY): Payer: Self-pay

## 2023-10-01 ENCOUNTER — Encounter: Payer: Self-pay | Admitting: Sports Medicine

## 2023-11-18 ENCOUNTER — Other Ambulatory Visit (HOSPITAL_COMMUNITY): Payer: Self-pay

## 2023-11-18 MED ORDER — ATORVASTATIN CALCIUM 40 MG PO TABS
40.0000 mg | ORAL_TABLET | Freq: Every day | ORAL | 1 refills | Status: AC
  Filled 2023-11-18 – 2024-02-03 (×2): qty 90, 90d supply, fill #0

## 2023-11-19 ENCOUNTER — Other Ambulatory Visit (HOSPITAL_COMMUNITY): Payer: Self-pay

## 2024-01-20 ENCOUNTER — Other Ambulatory Visit (HOSPITAL_COMMUNITY): Payer: Self-pay

## 2024-02-03 ENCOUNTER — Other Ambulatory Visit (HOSPITAL_BASED_OUTPATIENT_CLINIC_OR_DEPARTMENT_OTHER): Payer: Self-pay | Admitting: Pulmonary Disease

## 2024-02-03 ENCOUNTER — Other Ambulatory Visit (HOSPITAL_COMMUNITY): Payer: Self-pay

## 2024-02-03 ENCOUNTER — Other Ambulatory Visit: Payer: Self-pay

## 2024-02-04 ENCOUNTER — Other Ambulatory Visit: Payer: Self-pay

## 2024-02-05 ENCOUNTER — Other Ambulatory Visit (HOSPITAL_COMMUNITY): Payer: Self-pay

## 2024-02-05 MED ORDER — SPIRIVA RESPIMAT 2.5 MCG/ACT IN AERS
2.0000 | INHALATION_SPRAY | Freq: Every day | RESPIRATORY_TRACT | 3 refills | Status: AC
  Filled 2024-02-05: qty 12, 90d supply, fill #0

## 2024-02-05 MED ORDER — SPIRIVA RESPIMAT 2.5 MCG/ACT IN AERS
2.0000 | INHALATION_SPRAY | Freq: Every day | RESPIRATORY_TRACT | 3 refills | Status: AC
  Filled 2024-02-05: qty 4, 30d supply, fill #0

## 2024-02-06 ENCOUNTER — Other Ambulatory Visit (HOSPITAL_COMMUNITY): Payer: Self-pay

## 2024-02-06 ENCOUNTER — Encounter: Payer: Self-pay | Admitting: Pharmacist

## 2024-02-06 ENCOUNTER — Other Ambulatory Visit: Payer: Self-pay
